# Patient Record
Sex: Female | Born: 1935 | Race: White | Hispanic: No | Marital: Married | Smoking: Never smoker
Health system: Southern US, Community
[De-identification: ages and names within clinical notes are randomized; demographics above are authoritative.]

## PROBLEM LIST (undated history)

## (undated) DIAGNOSIS — E785 Hyperlipidemia, unspecified: Secondary | ICD-10-CM

## (undated) DIAGNOSIS — Z8719 Personal history of other diseases of the digestive system: Secondary | ICD-10-CM

## (undated) DIAGNOSIS — K3184 Gastroparesis: Secondary | ICD-10-CM

## (undated) DIAGNOSIS — N189 Chronic kidney disease, unspecified: Secondary | ICD-10-CM

## (undated) DIAGNOSIS — I10 Essential (primary) hypertension: Secondary | ICD-10-CM

## (undated) DIAGNOSIS — D131 Benign neoplasm of stomach: Secondary | ICD-10-CM

## (undated) DIAGNOSIS — K219 Gastro-esophageal reflux disease without esophagitis: Secondary | ICD-10-CM

## (undated) DIAGNOSIS — I639 Cerebral infarction, unspecified: Secondary | ICD-10-CM

## (undated) DIAGNOSIS — R809 Proteinuria, unspecified: Principal | ICD-10-CM

## (undated) DIAGNOSIS — Z9889 Other specified postprocedural states: Secondary | ICD-10-CM

## (undated) DIAGNOSIS — E119 Type 2 diabetes mellitus without complications: Secondary | ICD-10-CM

## (undated) DIAGNOSIS — D649 Anemia, unspecified: Secondary | ICD-10-CM

## (undated) DIAGNOSIS — K635 Polyp of colon: Secondary | ICD-10-CM

## (undated) DIAGNOSIS — A4902 Methicillin resistant Staphylococcus aureus infection, unspecified site: Secondary | ICD-10-CM

## (undated) DIAGNOSIS — D472 Monoclonal gammopathy: Secondary | ICD-10-CM

## (undated) DIAGNOSIS — K760 Fatty (change of) liver, not elsewhere classified: Secondary | ICD-10-CM

## (undated) DIAGNOSIS — K5792 Diverticulitis of intestine, part unspecified, without perforation or abscess without bleeding: Secondary | ICD-10-CM

## (undated) DIAGNOSIS — M1990 Unspecified osteoarthritis, unspecified site: Secondary | ICD-10-CM

## (undated) HISTORY — DX: Gastro-esophageal reflux disease without esophagitis: K21.9

## (undated) HISTORY — DX: Chronic kidney disease, unspecified: N18.9

## (undated) HISTORY — DX: Cerebral infarction, unspecified: I63.9

## (undated) HISTORY — DX: Other specified postprocedural states: Z98.890

## (undated) HISTORY — PX: NASAL SINUS SURGERY: SHX719

## (undated) HISTORY — PX: BILATERAL OOPHORECTOMY: SHX1221

## (undated) HISTORY — DX: Fatty (change of) liver, not elsewhere classified: K76.0

## (undated) HISTORY — DX: Proteinuria, unspecified: R80.9

## (undated) HISTORY — DX: Personal history of other diseases of the digestive system: Z87.19

## (undated) HISTORY — DX: Monoclonal gammopathy: D47.2

## (undated) HISTORY — DX: Hyperlipidemia, unspecified: E78.5

## (undated) HISTORY — PX: REPLACEMENT TOTAL KNEE: SUR1224

## (undated) HISTORY — PX: LAPAROSCOPIC COLOSTOMY: SHX1921

## (undated) HISTORY — DX: Essential (primary) hypertension: I10

## (undated) HISTORY — DX: Polyp of colon: K63.5

## (undated) HISTORY — PX: ABDOMINAL HERNIA REPAIR: SHX539

## (undated) HISTORY — DX: Benign neoplasm of stomach: D13.1

## (undated) HISTORY — DX: Anemia, unspecified: D64.9

## (undated) HISTORY — DX: Methicillin resistant Staphylococcus aureus infection, unspecified site: A49.02

## (undated) HISTORY — PX: PARTIAL COLECTOMY: SHX5273

## (undated) HISTORY — DX: Type 2 diabetes mellitus without complications: E11.9

## (undated) HISTORY — DX: Unspecified osteoarthritis, unspecified site: M19.90

## (undated) HISTORY — DX: Gastroparesis: K31.84

## (undated) HISTORY — PX: OTHER SURGICAL HISTORY: SHX169

## (undated) HISTORY — PX: TONSILLECTOMY: SUR1361

## (undated) HISTORY — PX: ROTATOR CUFF REPAIR: SHX139

---

## 1999-01-08 ENCOUNTER — Other Ambulatory Visit: Admission: RE | Admit: 1999-01-08 | Discharge: 1999-01-08 | Payer: Self-pay | Admitting: Family Medicine

## 1999-01-19 ENCOUNTER — Ambulatory Visit (HOSPITAL_COMMUNITY): Admission: RE | Admit: 1999-01-19 | Discharge: 1999-01-19 | Payer: Self-pay | Admitting: *Deleted

## 2000-01-21 ENCOUNTER — Encounter: Payer: Self-pay | Admitting: Family Medicine

## 2000-01-21 ENCOUNTER — Encounter: Admission: RE | Admit: 2000-01-21 | Discharge: 2000-01-21 | Payer: Self-pay | Admitting: Family Medicine

## 2000-03-16 ENCOUNTER — Encounter: Payer: Self-pay | Admitting: Gastroenterology

## 2000-03-16 ENCOUNTER — Other Ambulatory Visit: Admission: RE | Admit: 2000-03-16 | Discharge: 2000-04-07 | Payer: Self-pay | Admitting: Family Medicine

## 2000-03-16 ENCOUNTER — Ambulatory Visit (HOSPITAL_COMMUNITY): Admission: RE | Admit: 2000-03-16 | Discharge: 2000-03-16 | Payer: Self-pay | Admitting: Gastroenterology

## 2000-04-07 ENCOUNTER — Encounter: Payer: Self-pay | Admitting: Family Medicine

## 2000-04-07 ENCOUNTER — Ambulatory Visit (HOSPITAL_COMMUNITY): Admission: RE | Admit: 2000-04-07 | Discharge: 2000-04-07 | Payer: Self-pay | Admitting: Family Medicine

## 2000-04-14 ENCOUNTER — Encounter (INDEPENDENT_AMBULATORY_CARE_PROVIDER_SITE_OTHER): Payer: Self-pay

## 2000-04-14 ENCOUNTER — Other Ambulatory Visit: Admission: RE | Admit: 2000-04-14 | Discharge: 2000-04-14 | Payer: Self-pay | Admitting: Obstetrics and Gynecology

## 2000-04-15 ENCOUNTER — Encounter (INDEPENDENT_AMBULATORY_CARE_PROVIDER_SITE_OTHER): Payer: Self-pay | Admitting: *Deleted

## 2000-04-15 ENCOUNTER — Ambulatory Visit (HOSPITAL_COMMUNITY): Admission: RE | Admit: 2000-04-15 | Discharge: 2000-04-15 | Payer: Self-pay | Admitting: Surgery

## 2000-08-01 ENCOUNTER — Encounter: Payer: Self-pay | Admitting: Orthopedic Surgery

## 2000-08-01 ENCOUNTER — Encounter: Admission: RE | Admit: 2000-08-01 | Discharge: 2000-08-01 | Payer: Self-pay | Admitting: Orthopedic Surgery

## 2000-08-31 ENCOUNTER — Inpatient Hospital Stay (HOSPITAL_COMMUNITY): Admission: RE | Admit: 2000-08-31 | Discharge: 2000-09-02 | Payer: Self-pay | Admitting: Orthopedic Surgery

## 2001-01-24 ENCOUNTER — Encounter: Payer: Self-pay | Admitting: Family Medicine

## 2001-01-24 ENCOUNTER — Encounter: Admission: RE | Admit: 2001-01-24 | Discharge: 2001-01-24 | Payer: Self-pay | Admitting: Family Medicine

## 2001-05-12 ENCOUNTER — Other Ambulatory Visit: Admission: RE | Admit: 2001-05-12 | Discharge: 2001-05-12 | Payer: Self-pay | Admitting: Obstetrics and Gynecology

## 2001-05-15 ENCOUNTER — Encounter: Payer: Self-pay | Admitting: Family Medicine

## 2001-05-15 ENCOUNTER — Encounter: Admission: RE | Admit: 2001-05-15 | Discharge: 2001-05-15 | Payer: Self-pay | Admitting: Family Medicine

## 2001-08-04 ENCOUNTER — Encounter: Payer: Self-pay | Admitting: Anesthesiology

## 2001-08-11 ENCOUNTER — Observation Stay (HOSPITAL_COMMUNITY): Admission: RE | Admit: 2001-08-11 | Discharge: 2001-08-13 | Payer: Self-pay | Admitting: Orthopedic Surgery

## 2001-08-12 ENCOUNTER — Encounter: Payer: Self-pay | Admitting: Orthopedic Surgery

## 2002-02-01 ENCOUNTER — Encounter: Admission: RE | Admit: 2002-02-01 | Discharge: 2002-02-01 | Payer: Self-pay | Admitting: Family Medicine

## 2002-02-01 ENCOUNTER — Encounter: Payer: Self-pay | Admitting: Family Medicine

## 2002-06-22 ENCOUNTER — Ambulatory Visit (HOSPITAL_COMMUNITY): Admission: RE | Admit: 2002-06-22 | Discharge: 2002-06-22 | Payer: Self-pay | Admitting: Family Medicine

## 2002-06-22 ENCOUNTER — Encounter: Payer: Self-pay | Admitting: Family Medicine

## 2002-07-23 ENCOUNTER — Other Ambulatory Visit: Admission: RE | Admit: 2002-07-23 | Discharge: 2002-07-23 | Payer: Self-pay | Admitting: Obstetrics and Gynecology

## 2002-10-23 ENCOUNTER — Encounter: Payer: Self-pay | Admitting: Family Medicine

## 2002-10-23 ENCOUNTER — Encounter: Admission: RE | Admit: 2002-10-23 | Discharge: 2002-10-23 | Payer: Self-pay | Admitting: Family Medicine

## 2003-03-12 ENCOUNTER — Encounter: Admission: RE | Admit: 2003-03-12 | Discharge: 2003-03-12 | Payer: Self-pay | Admitting: Family Medicine

## 2003-03-12 ENCOUNTER — Encounter: Payer: Self-pay | Admitting: Family Medicine

## 2003-06-05 ENCOUNTER — Encounter: Payer: Self-pay | Admitting: Gastroenterology

## 2003-06-05 ENCOUNTER — Encounter (INDEPENDENT_AMBULATORY_CARE_PROVIDER_SITE_OTHER): Payer: Self-pay | Admitting: Specialist

## 2003-06-05 ENCOUNTER — Ambulatory Visit (HOSPITAL_COMMUNITY): Admission: RE | Admit: 2003-06-05 | Discharge: 2003-06-05 | Payer: Self-pay | Admitting: Gastroenterology

## 2003-11-02 DIAGNOSIS — K5792 Diverticulitis of intestine, part unspecified, without perforation or abscess without bleeding: Secondary | ICD-10-CM

## 2003-11-02 HISTORY — DX: Diverticulitis of intestine, part unspecified, without perforation or abscess without bleeding: K57.92

## 2004-07-09 ENCOUNTER — Ambulatory Visit (HOSPITAL_COMMUNITY): Admission: RE | Admit: 2004-07-09 | Discharge: 2004-07-09 | Payer: Self-pay | Admitting: Family Medicine

## 2004-09-03 ENCOUNTER — Other Ambulatory Visit: Admission: RE | Admit: 2004-09-03 | Discharge: 2004-09-03 | Payer: Self-pay | Admitting: Family Medicine

## 2004-09-22 ENCOUNTER — Ambulatory Visit: Payer: Self-pay | Admitting: Gastroenterology

## 2004-10-01 LAB — HM COLONOSCOPY

## 2004-10-19 ENCOUNTER — Ambulatory Visit: Payer: Self-pay | Admitting: Gastroenterology

## 2004-11-06 ENCOUNTER — Ambulatory Visit: Payer: Self-pay | Admitting: Gastroenterology

## 2005-02-04 ENCOUNTER — Emergency Department (HOSPITAL_COMMUNITY): Admission: EM | Admit: 2005-02-04 | Discharge: 2005-02-04 | Payer: Self-pay | Admitting: Emergency Medicine

## 2005-09-16 ENCOUNTER — Ambulatory Visit: Payer: Self-pay | Admitting: Gastroenterology

## 2005-09-20 ENCOUNTER — Ambulatory Visit: Payer: Self-pay | Admitting: Cardiology

## 2005-10-05 ENCOUNTER — Ambulatory Visit: Payer: Self-pay | Admitting: Gastroenterology

## 2005-11-11 ENCOUNTER — Ambulatory Visit (HOSPITAL_COMMUNITY): Admission: RE | Admit: 2005-11-11 | Discharge: 2005-11-11 | Payer: Self-pay | Admitting: Orthopedic Surgery

## 2005-11-18 ENCOUNTER — Ambulatory Visit: Payer: Self-pay | Admitting: Gastroenterology

## 2006-03-31 ENCOUNTER — Encounter: Admission: RE | Admit: 2006-03-31 | Discharge: 2006-04-26 | Payer: Self-pay | Admitting: Family Medicine

## 2006-04-27 ENCOUNTER — Encounter: Admission: RE | Admit: 2006-04-27 | Discharge: 2006-05-30 | Payer: Self-pay | Admitting: Family Medicine

## 2006-09-01 ENCOUNTER — Encounter: Admission: RE | Admit: 2006-09-01 | Discharge: 2006-09-01 | Payer: Self-pay | Admitting: Family Medicine

## 2007-07-11 ENCOUNTER — Inpatient Hospital Stay (HOSPITAL_COMMUNITY): Admission: RE | Admit: 2007-07-11 | Discharge: 2007-07-17 | Payer: Self-pay | Admitting: Orthopedic Surgery

## 2007-11-09 ENCOUNTER — Encounter: Admission: RE | Admit: 2007-11-09 | Discharge: 2007-11-09 | Payer: Self-pay | Admitting: Family Medicine

## 2008-01-31 LAB — HM DIABETES EYE EXAM

## 2008-06-25 ENCOUNTER — Ambulatory Visit (HOSPITAL_COMMUNITY): Admission: RE | Admit: 2008-06-25 | Discharge: 2008-06-25 | Payer: Self-pay | Admitting: Orthopedic Surgery

## 2008-07-10 ENCOUNTER — Encounter: Admission: RE | Admit: 2008-07-10 | Discharge: 2008-09-11 | Payer: Self-pay | Admitting: Orthopedic Surgery

## 2008-07-11 ENCOUNTER — Encounter: Payer: Self-pay | Admitting: Internal Medicine

## 2008-08-02 ENCOUNTER — Inpatient Hospital Stay (HOSPITAL_COMMUNITY): Admission: EM | Admit: 2008-08-02 | Discharge: 2008-08-05 | Payer: Self-pay | Admitting: Emergency Medicine

## 2008-08-05 ENCOUNTER — Ambulatory Visit: Payer: Self-pay | Admitting: Gastroenterology

## 2008-08-12 ENCOUNTER — Ambulatory Visit: Payer: Self-pay | Admitting: Internal Medicine

## 2008-08-12 DIAGNOSIS — I10 Essential (primary) hypertension: Secondary | ICD-10-CM

## 2008-08-12 DIAGNOSIS — E119 Type 2 diabetes mellitus without complications: Secondary | ICD-10-CM | POA: Insufficient documentation

## 2008-08-12 DIAGNOSIS — M1990 Unspecified osteoarthritis, unspecified site: Secondary | ICD-10-CM | POA: Insufficient documentation

## 2008-08-12 DIAGNOSIS — Z96659 Presence of unspecified artificial knee joint: Secondary | ICD-10-CM

## 2008-08-12 DIAGNOSIS — M25569 Pain in unspecified knee: Secondary | ICD-10-CM | POA: Insufficient documentation

## 2008-08-12 DIAGNOSIS — E785 Hyperlipidemia, unspecified: Secondary | ICD-10-CM

## 2008-08-12 DIAGNOSIS — K219 Gastro-esophageal reflux disease without esophagitis: Secondary | ICD-10-CM | POA: Insufficient documentation

## 2008-08-12 DIAGNOSIS — Z8719 Personal history of other diseases of the digestive system: Secondary | ICD-10-CM | POA: Insufficient documentation

## 2008-08-12 DIAGNOSIS — M810 Age-related osteoporosis without current pathological fracture: Secondary | ICD-10-CM

## 2008-08-14 ENCOUNTER — Encounter: Payer: Self-pay | Admitting: Gastroenterology

## 2008-08-30 ENCOUNTER — Ambulatory Visit: Payer: Self-pay | Admitting: Cardiology

## 2008-10-11 ENCOUNTER — Encounter: Payer: Self-pay | Admitting: Gastroenterology

## 2008-10-11 ENCOUNTER — Encounter (INDEPENDENT_AMBULATORY_CARE_PROVIDER_SITE_OTHER): Payer: Self-pay | Admitting: General Surgery

## 2008-10-11 ENCOUNTER — Inpatient Hospital Stay (HOSPITAL_COMMUNITY): Admission: RE | Admit: 2008-10-11 | Discharge: 2008-10-25 | Payer: Self-pay | Admitting: General Surgery

## 2008-10-28 ENCOUNTER — Inpatient Hospital Stay (HOSPITAL_COMMUNITY): Admission: AD | Admit: 2008-10-28 | Discharge: 2008-11-12 | Payer: Self-pay | Admitting: General Surgery

## 2008-11-20 ENCOUNTER — Emergency Department (HOSPITAL_COMMUNITY): Admission: EM | Admit: 2008-11-20 | Discharge: 2008-11-20 | Payer: Self-pay | Admitting: Emergency Medicine

## 2008-12-09 ENCOUNTER — Telehealth: Payer: Self-pay | Admitting: Gastroenterology

## 2008-12-10 ENCOUNTER — Encounter: Payer: Self-pay | Admitting: Gastroenterology

## 2008-12-10 ENCOUNTER — Inpatient Hospital Stay (HOSPITAL_COMMUNITY): Admission: AD | Admit: 2008-12-10 | Discharge: 2008-12-13 | Payer: Self-pay | Admitting: General Surgery

## 2009-03-07 ENCOUNTER — Ambulatory Visit (HOSPITAL_COMMUNITY): Admission: RE | Admit: 2009-03-07 | Discharge: 2009-03-07 | Payer: Self-pay | Admitting: Family Medicine

## 2009-04-01 ENCOUNTER — Ambulatory Visit: Payer: Self-pay | Admitting: Vascular Surgery

## 2009-04-01 ENCOUNTER — Inpatient Hospital Stay (HOSPITAL_COMMUNITY): Admission: AD | Admit: 2009-04-01 | Discharge: 2009-04-03 | Payer: Self-pay | Admitting: General Surgery

## 2009-04-02 ENCOUNTER — Encounter (INDEPENDENT_AMBULATORY_CARE_PROVIDER_SITE_OTHER): Payer: Self-pay | Admitting: General Surgery

## 2009-04-16 ENCOUNTER — Encounter: Admission: RE | Admit: 2009-04-16 | Discharge: 2009-04-16 | Payer: Self-pay | Admitting: Family Medicine

## 2009-04-25 ENCOUNTER — Encounter: Admission: RE | Admit: 2009-04-25 | Discharge: 2009-04-25 | Payer: Self-pay | Admitting: Family Medicine

## 2009-05-19 ENCOUNTER — Ambulatory Visit (HOSPITAL_BASED_OUTPATIENT_CLINIC_OR_DEPARTMENT_OTHER): Admission: RE | Admit: 2009-05-19 | Discharge: 2009-05-19 | Payer: Self-pay | Admitting: General Surgery

## 2009-05-19 ENCOUNTER — Encounter (INDEPENDENT_AMBULATORY_CARE_PROVIDER_SITE_OTHER): Payer: Self-pay | Admitting: General Surgery

## 2009-05-19 ENCOUNTER — Encounter: Admission: RE | Admit: 2009-05-19 | Discharge: 2009-05-19 | Payer: Self-pay | Admitting: General Surgery

## 2009-06-06 ENCOUNTER — Ambulatory Visit (HOSPITAL_COMMUNITY): Admission: RE | Admit: 2009-06-06 | Discharge: 2009-06-06 | Payer: Self-pay | Admitting: General Surgery

## 2009-07-02 DIAGNOSIS — A4902 Methicillin resistant Staphylococcus aureus infection, unspecified site: Secondary | ICD-10-CM

## 2009-07-02 HISTORY — DX: Methicillin resistant Staphylococcus aureus infection, unspecified site: A49.02

## 2009-07-10 ENCOUNTER — Encounter (INDEPENDENT_AMBULATORY_CARE_PROVIDER_SITE_OTHER): Payer: Self-pay | Admitting: General Surgery

## 2009-07-10 ENCOUNTER — Inpatient Hospital Stay (HOSPITAL_COMMUNITY): Admission: RE | Admit: 2009-07-10 | Discharge: 2009-07-23 | Payer: Self-pay | Admitting: General Surgery

## 2009-07-28 ENCOUNTER — Inpatient Hospital Stay (HOSPITAL_COMMUNITY): Admission: EM | Admit: 2009-07-28 | Discharge: 2009-09-16 | Payer: Self-pay | Admitting: Emergency Medicine

## 2009-07-28 ENCOUNTER — Ambulatory Visit: Payer: Self-pay | Admitting: Internal Medicine

## 2009-07-31 ENCOUNTER — Ambulatory Visit: Payer: Self-pay | Admitting: Vascular Surgery

## 2009-07-31 ENCOUNTER — Encounter (INDEPENDENT_AMBULATORY_CARE_PROVIDER_SITE_OTHER): Payer: Self-pay | Admitting: Internal Medicine

## 2009-08-01 ENCOUNTER — Encounter (INDEPENDENT_AMBULATORY_CARE_PROVIDER_SITE_OTHER): Payer: Self-pay | Admitting: Internal Medicine

## 2009-08-05 ENCOUNTER — Ambulatory Visit: Payer: Self-pay | Admitting: Internal Medicine

## 2009-08-06 ENCOUNTER — Ambulatory Visit: Admission: RE | Admit: 2009-08-06 | Discharge: 2009-08-06 | Payer: Self-pay | Admitting: General Surgery

## 2009-08-06 ENCOUNTER — Encounter (INDEPENDENT_AMBULATORY_CARE_PROVIDER_SITE_OTHER): Payer: Self-pay | Admitting: General Surgery

## 2009-08-07 ENCOUNTER — Encounter (INDEPENDENT_AMBULATORY_CARE_PROVIDER_SITE_OTHER): Payer: Self-pay | Admitting: General Surgery

## 2009-08-21 ENCOUNTER — Encounter: Payer: Self-pay | Admitting: Internal Medicine

## 2009-09-02 ENCOUNTER — Ambulatory Visit: Payer: Self-pay | Admitting: Surgery

## 2009-09-09 ENCOUNTER — Ambulatory Visit: Payer: Self-pay | Admitting: Internal Medicine

## 2009-10-07 ENCOUNTER — Ambulatory Visit: Payer: Self-pay | Admitting: Vascular Surgery

## 2009-10-17 ENCOUNTER — Ambulatory Visit: Payer: Self-pay | Admitting: Gastroenterology

## 2009-10-21 ENCOUNTER — Encounter (INDEPENDENT_AMBULATORY_CARE_PROVIDER_SITE_OTHER): Payer: Self-pay | Admitting: Internal Medicine

## 2009-10-21 ENCOUNTER — Ambulatory Visit: Payer: Self-pay | Admitting: Cardiology

## 2009-10-21 ENCOUNTER — Inpatient Hospital Stay (HOSPITAL_COMMUNITY): Admission: EM | Admit: 2009-10-21 | Discharge: 2009-10-24 | Payer: Self-pay | Admitting: Emergency Medicine

## 2009-10-21 ENCOUNTER — Ambulatory Visit: Payer: Self-pay | Admitting: Vascular Surgery

## 2009-10-22 ENCOUNTER — Encounter (INDEPENDENT_AMBULATORY_CARE_PROVIDER_SITE_OTHER): Payer: Self-pay | Admitting: Internal Medicine

## 2009-11-19 ENCOUNTER — Encounter: Payer: Self-pay | Admitting: Internal Medicine

## 2009-12-01 ENCOUNTER — Ambulatory Visit: Payer: Self-pay | Admitting: Vascular Surgery

## 2009-12-01 ENCOUNTER — Ambulatory Visit (HOSPITAL_COMMUNITY)
Admission: RE | Admit: 2009-12-01 | Discharge: 2009-12-01 | Payer: Self-pay | Source: Home / Self Care | Admitting: Vascular Surgery

## 2010-02-12 ENCOUNTER — Encounter: Admission: RE | Admit: 2010-02-12 | Discharge: 2010-05-13 | Payer: Self-pay | Admitting: Family Medicine

## 2010-06-11 ENCOUNTER — Encounter: Admission: RE | Admit: 2010-06-11 | Discharge: 2010-06-11 | Payer: Self-pay | Admitting: Family Medicine

## 2010-06-11 LAB — HM MAMMOGRAPHY

## 2010-11-22 ENCOUNTER — Encounter: Payer: Self-pay | Admitting: Family Medicine

## 2010-11-23 ENCOUNTER — Encounter: Payer: Self-pay | Admitting: Family Medicine

## 2010-12-03 NOTE — Letter (Signed)
Summary: [facility]   Imported By: Sherian Rein 12/08/2009 10:24:52  _____________________________________________________________________  External Attachment:    Type:   Image     Comment:   External Document

## 2010-12-31 IMAGING — CR DG CHEST 1V PORT
1 series · 1 of 1 positions shown · non-contrast
Comparison: 12/10/2008

CLINICAL DATA: Possible pneumonia.  Abdominal wall infection.  Line
placement.

PORTABLE CHEST - 1 VIEW

[view not recorded]
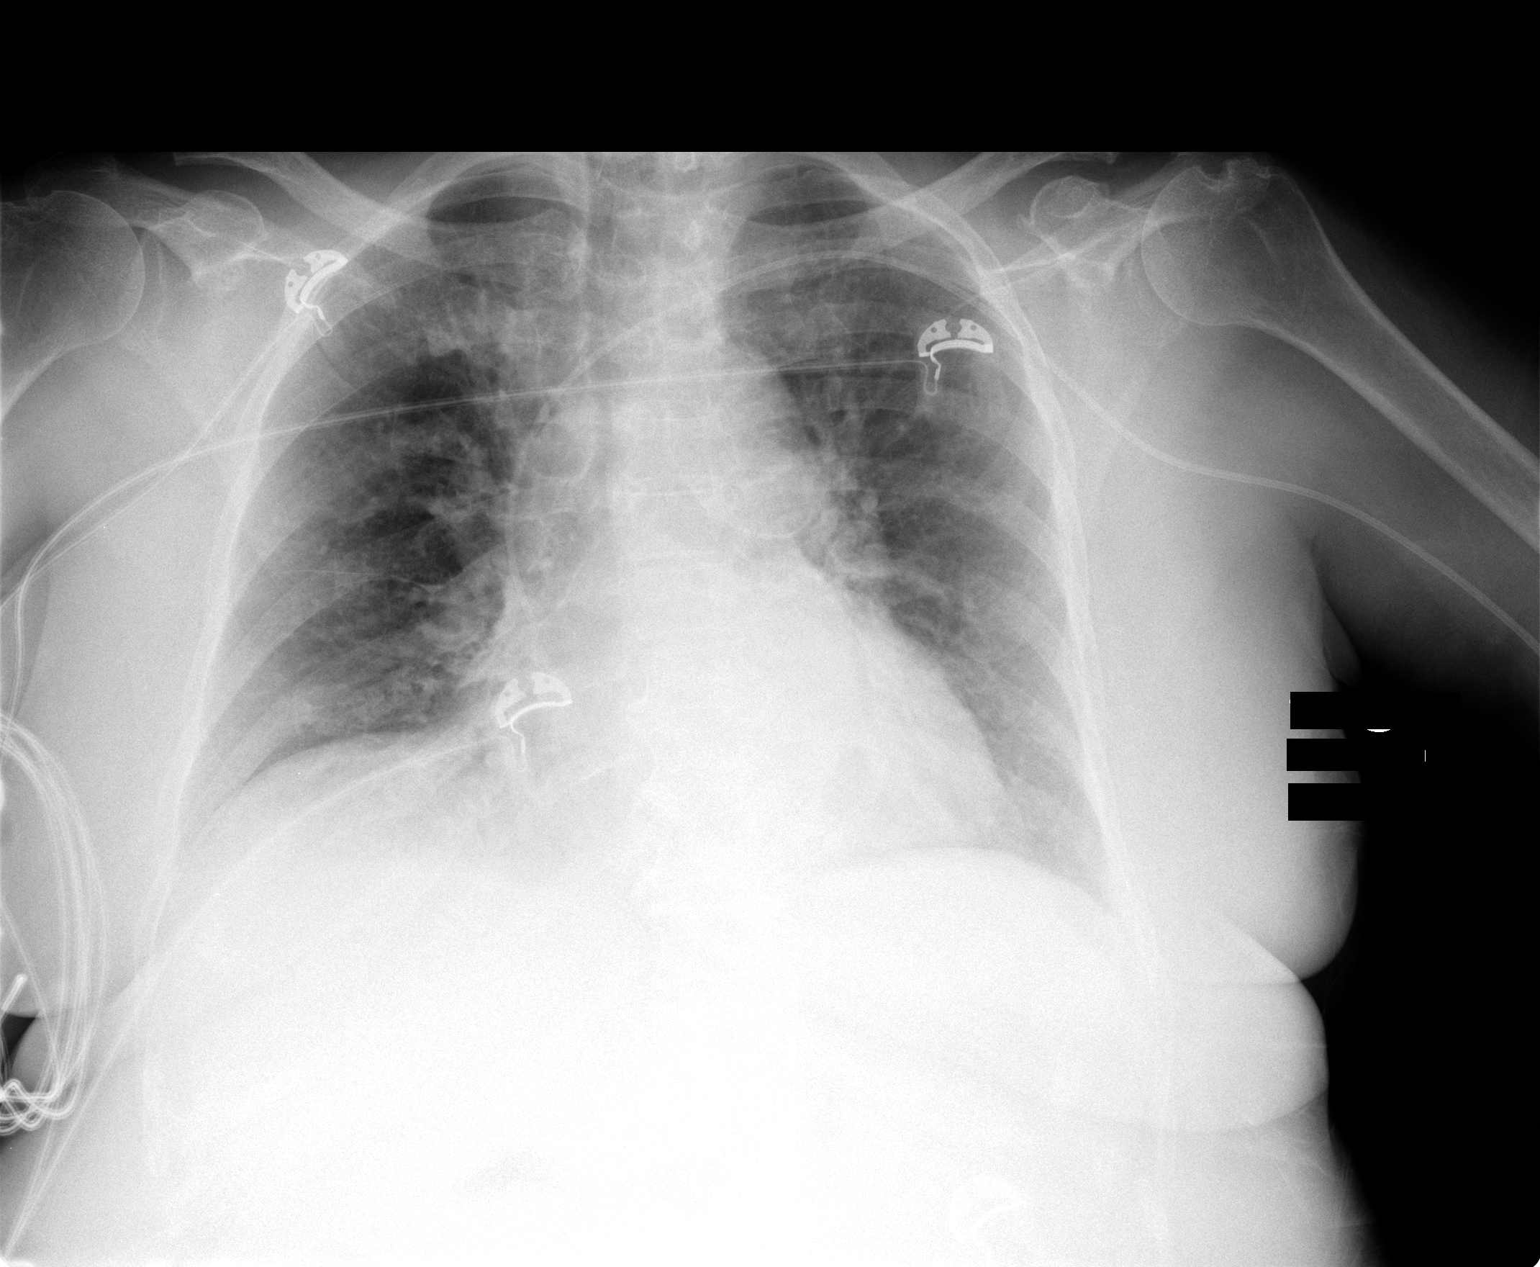

[1 of 1 positions shown; findings below may reference images not displayed]

FINDINGS: Left-sided PICC line has been placed.  The tip overlies
the lower superior vena cava.

Heart size is within normal limits accounting for the portable
position of the patient.  Patchy parenchymal infiltrates are
identified, right greater than left.  Right subpulmonic pleural
effusion is suspected. No evidence for pneumothorax.

There are erosions along the upper surface of both humeral heads.
IMPRESSION: Left-sided PICC line tip to the superior vena cava.

## 2011-01-03 IMAGING — CR DG CHEST 1V PORT
1 series · 1 of 1 positions shown · non-contrast
Comparison: 07/28/2009 study.

CLINICAL DATA: History given of nausea and chest pain.  History of
pulmonary infiltrates.  Follow-up.

PORTABLE CHEST - 1 VIEW

[view not recorded]
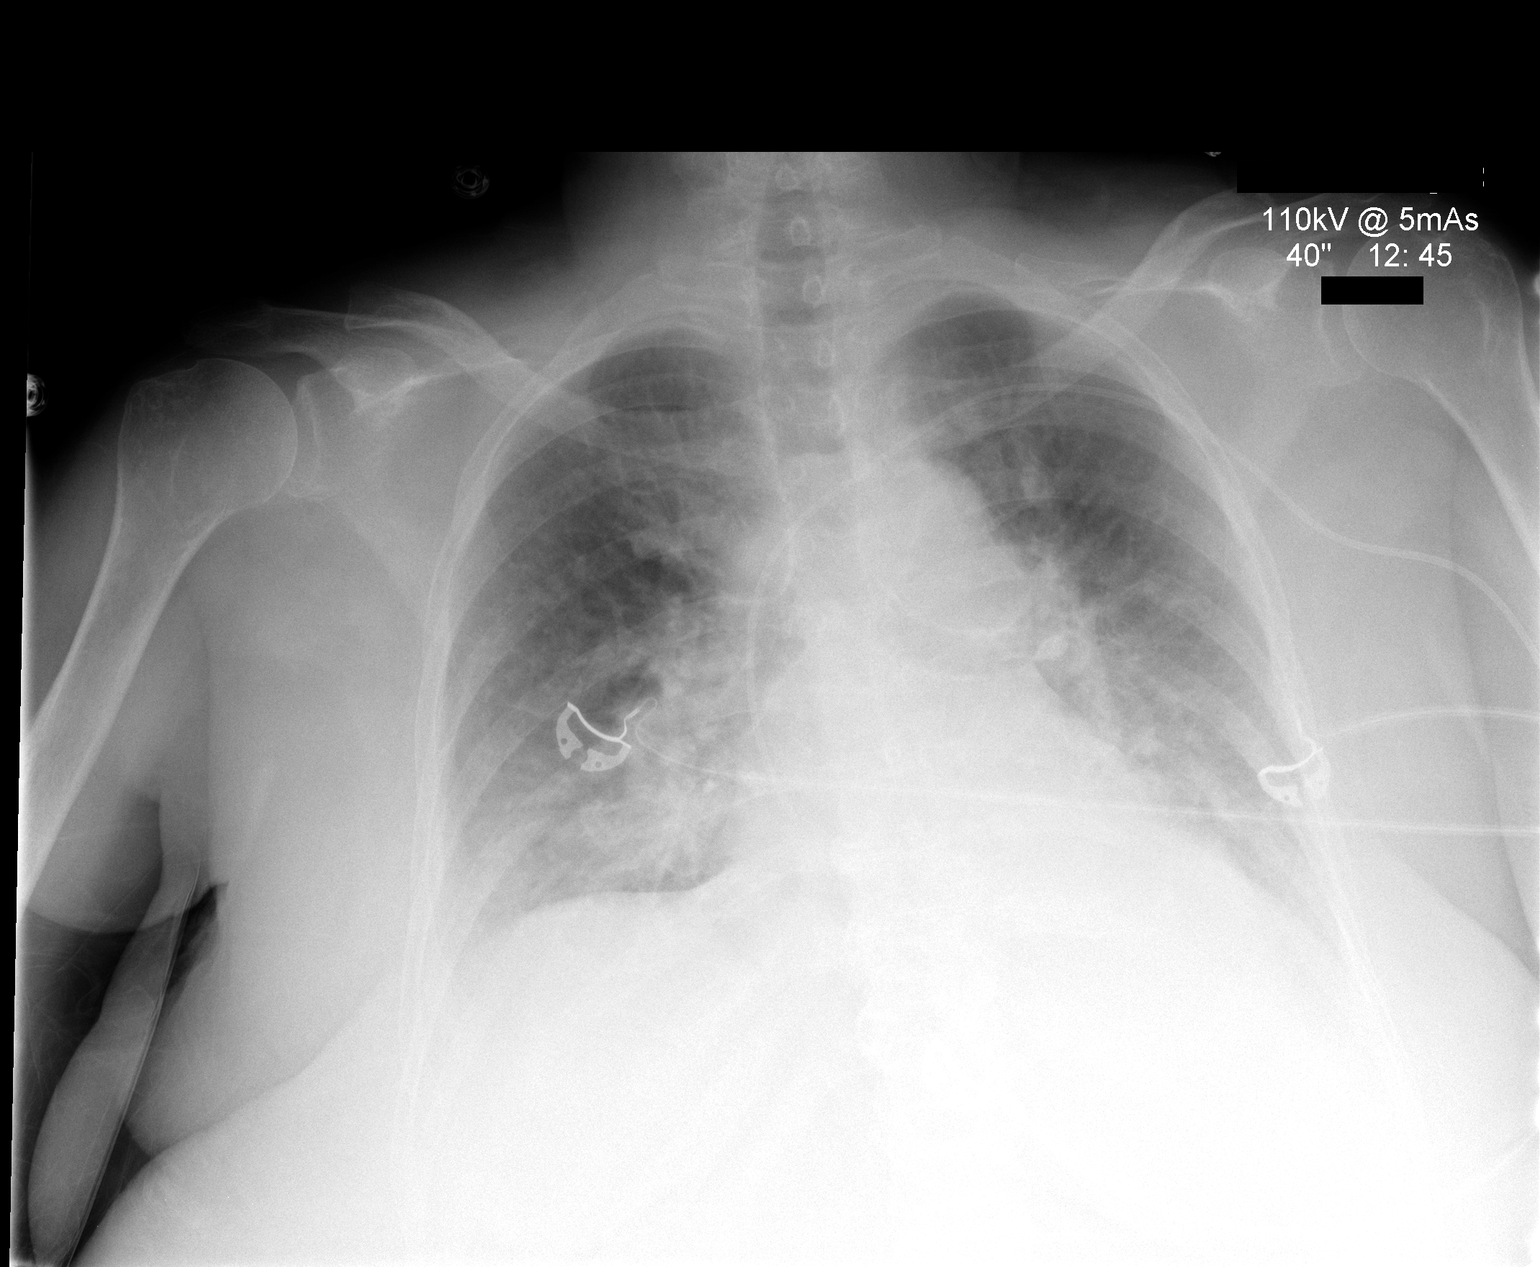

[1 of 1 positions shown; findings below may reference images not displayed]

FINDINGS: Left PICC is in place with tip in proximal superior vena
cava and cavoatrial junction.  No pneumothorax is evident.  Cardiac
silhouette is mildly enlarged.  It is slightly larger than on the
previous study. There is mild upper lobe vessel prominence with
central vascular congestion pattern.  There is minimal medial
basilar atelectasis on the right with slight indistinctness of
right costophrenic angle which may reflect a small amount of
pleural fluid.  On the previous study the left hemidiaphragm was
well visualized. There is now opacity in the inferior left
hemithorax with obliteration margin of left hemidiaphragm.  This is
consistent with atelectasis, infiltrate and / or pleural effusion.
Ectasia and nonaneurysmal calcification of the thoracic aorta is
seen. There is mild degenerative spondylosis compatible with age.
There is a mildly osteopenic appearance of the bones.
IMPRESSION: Left PICC is in place with no evidence of pneumothorax.

There is slight enlargement of the cardiac silhouette which appears
slightly larger than on previous study.  There is mild vascular
congestion pattern.

There is minimal right basilar atelectasis and infiltrative density
with indistinctness of right costophrenic angle which may reflect a
small amount of effusion.

There is increased opacity in the inferior left hemithorax with
obliteration of the margin of the left hemidiaphragm and
costophrenic angle.  This may reflect atelectasis, infiltrate, and
/ or pleural effusion.

Findings may reflect an element of volume overload, congestive
heart failure, pneumonia, or combination.

## 2011-01-11 IMAGING — CR DG CHEST 1V PORT
1 series · 1 of 1 positions shown · non-contrast
Comparison: Chest 08/07/2009.

CLINICAL DATA: Respiratory distress.

PORTABLE CHEST - 1 VIEW

[AP]
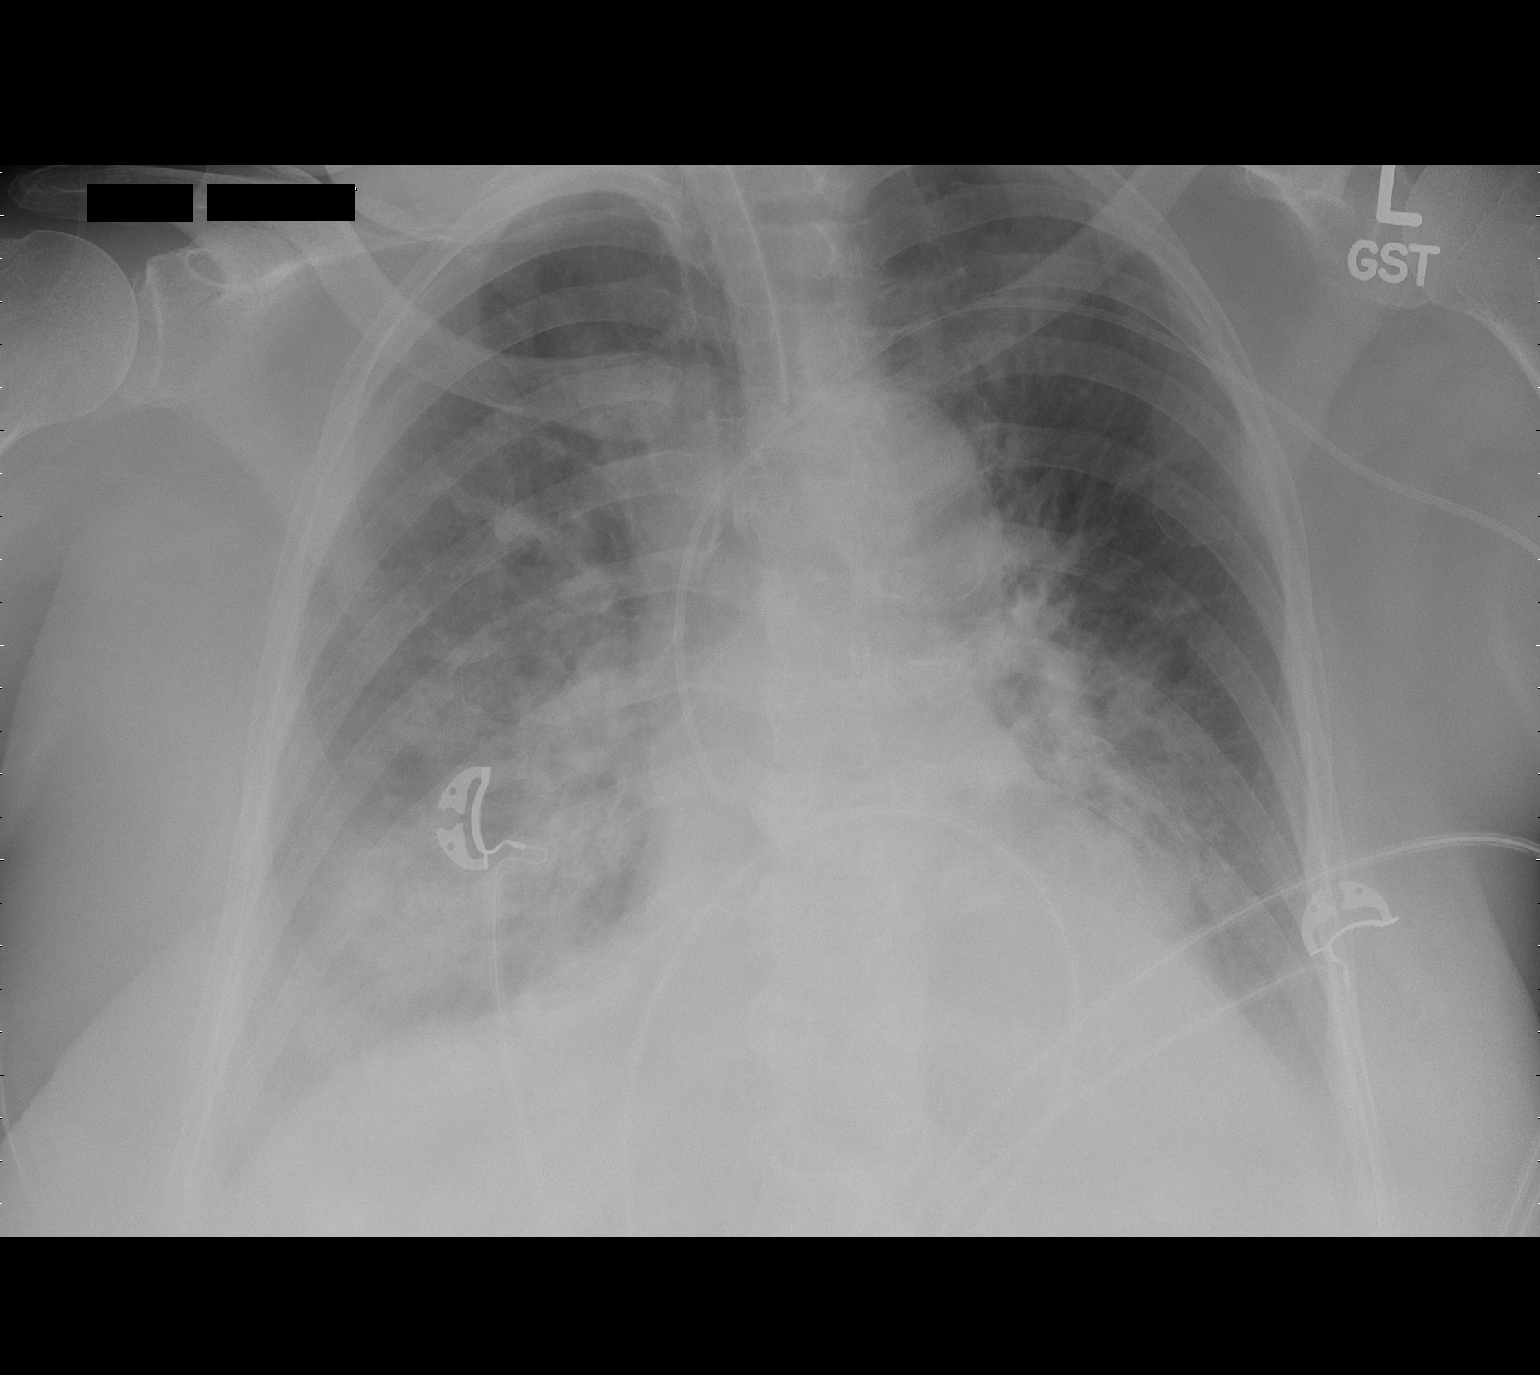

[1 of 1 positions shown; findings below may reference images not displayed]

FINDINGS: Endotracheal tube and left PICC remain in place.  Right
effusion and airspace disease are unchanged.  There has been some
improvement in the left upper and mid lung zone airspace disease.
Heart size upper normal.
IMPRESSION: Some improvement in left side airspace disease with no change on
the right.

## 2011-01-12 IMAGING — CR DG ABD PORTABLE 1V
1 series · 1 of 1 positions shown · non-contrast
Comparison: 07/28/2009

CLINICAL DATA: Feeding tube placement.

ABDOMEN - 1 VIEW

[AP]
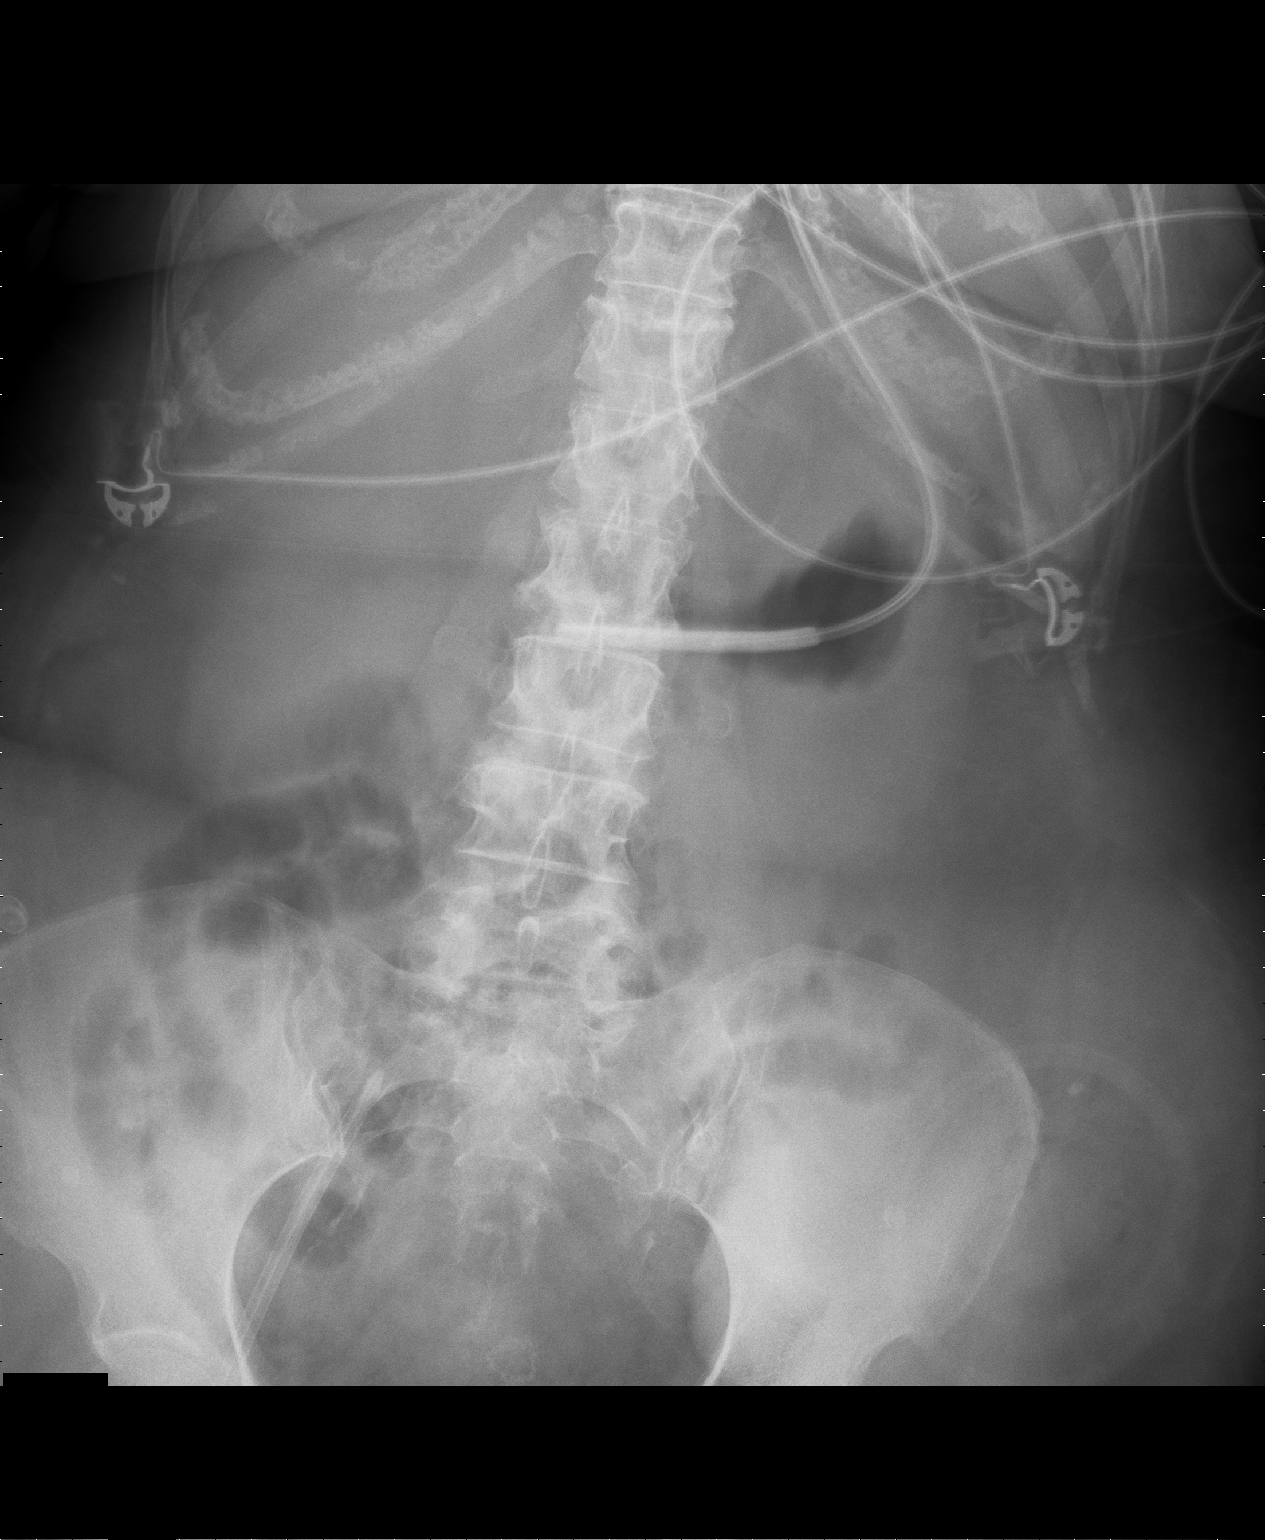

[1 of 1 positions shown; findings below may reference images not displayed]

FINDINGS: Feeding tube is in place with the tip in the distal
stomach.  Nonobstructive bowel gas pattern noted.
IMPRESSION: Feeding tube tip distal stomach.

## 2011-01-18 ENCOUNTER — Ambulatory Visit: Payer: Medicare Other | Attending: Family Medicine | Admitting: Physical Therapy

## 2011-01-18 DIAGNOSIS — M545 Low back pain, unspecified: Secondary | ICD-10-CM | POA: Insufficient documentation

## 2011-01-18 DIAGNOSIS — IMO0001 Reserved for inherently not codable concepts without codable children: Secondary | ICD-10-CM | POA: Insufficient documentation

## 2011-01-18 DIAGNOSIS — R5381 Other malaise: Secondary | ICD-10-CM | POA: Insufficient documentation

## 2011-01-18 DIAGNOSIS — M6281 Muscle weakness (generalized): Secondary | ICD-10-CM | POA: Insufficient documentation

## 2011-01-18 DIAGNOSIS — R293 Abnormal posture: Secondary | ICD-10-CM | POA: Insufficient documentation

## 2011-01-20 ENCOUNTER — Ambulatory Visit: Payer: Medicare Other | Admitting: Physical Therapy

## 2011-01-21 ENCOUNTER — Other Ambulatory Visit: Payer: Self-pay | Admitting: Dermatology

## 2011-01-26 ENCOUNTER — Ambulatory Visit: Payer: Medicare Other | Admitting: *Deleted

## 2011-01-29 ENCOUNTER — Ambulatory Visit: Payer: Medicare Other | Attending: Family Medicine | Admitting: Physical Therapy

## 2011-01-29 DIAGNOSIS — R5381 Other malaise: Secondary | ICD-10-CM | POA: Insufficient documentation

## 2011-01-29 DIAGNOSIS — M6281 Muscle weakness (generalized): Secondary | ICD-10-CM | POA: Insufficient documentation

## 2011-01-29 DIAGNOSIS — IMO0001 Reserved for inherently not codable concepts without codable children: Secondary | ICD-10-CM | POA: Insufficient documentation

## 2011-01-29 DIAGNOSIS — M545 Low back pain, unspecified: Secondary | ICD-10-CM | POA: Insufficient documentation

## 2011-01-29 DIAGNOSIS — R293 Abnormal posture: Secondary | ICD-10-CM | POA: Insufficient documentation

## 2011-02-01 LAB — GLUCOSE, CAPILLARY
Glucose-Capillary: 108 mg/dL — ABNORMAL HIGH (ref 70–99)
Glucose-Capillary: 110 mg/dL — ABNORMAL HIGH (ref 70–99)
Glucose-Capillary: 124 mg/dL — ABNORMAL HIGH (ref 70–99)
Glucose-Capillary: 125 mg/dL — ABNORMAL HIGH (ref 70–99)
Glucose-Capillary: 129 mg/dL — ABNORMAL HIGH (ref 70–99)
Glucose-Capillary: 134 mg/dL — ABNORMAL HIGH (ref 70–99)
Glucose-Capillary: 142 mg/dL — ABNORMAL HIGH (ref 70–99)
Glucose-Capillary: 143 mg/dL — ABNORMAL HIGH (ref 70–99)
Glucose-Capillary: 145 mg/dL — ABNORMAL HIGH (ref 70–99)
Glucose-Capillary: 153 mg/dL — ABNORMAL HIGH (ref 70–99)
Glucose-Capillary: 163 mg/dL — ABNORMAL HIGH (ref 70–99)
Glucose-Capillary: 185 mg/dL — ABNORMAL HIGH (ref 70–99)
Glucose-Capillary: 207 mg/dL — ABNORMAL HIGH (ref 70–99)
Glucose-Capillary: 98 mg/dL (ref 70–99)

## 2011-02-01 LAB — CBC
HCT: 36.2 % (ref 36.0–46.0)
HCT: 36.4 % (ref 36.0–46.0)
Hemoglobin: 11.8 g/dL — ABNORMAL LOW (ref 12.0–15.0)
MCHC: 31.5 g/dL (ref 30.0–36.0)
MCHC: 32.3 g/dL (ref 30.0–36.0)
MCHC: 32.6 g/dL (ref 30.0–36.0)
MCV: 95.8 fL (ref 78.0–100.0)
MCV: 97.2 fL (ref 78.0–100.0)
Platelets: 315 10*3/uL (ref 150–400)
Platelets: 337 10*3/uL (ref 150–400)
RBC: 3.21 MIL/uL — ABNORMAL LOW (ref 3.87–5.11)
RDW: 18.2 % — ABNORMAL HIGH (ref 11.5–15.5)
RDW: 18.3 % — ABNORMAL HIGH (ref 11.5–15.5)
WBC: 8 10*3/uL (ref 4.0–10.5)
WBC: 9.5 10*3/uL (ref 4.0–10.5)

## 2011-02-01 LAB — URINALYSIS, MICROSCOPIC ONLY
Bilirubin Urine: NEGATIVE
Ketones, ur: NEGATIVE mg/dL
Nitrite: NEGATIVE
Protein, ur: NEGATIVE mg/dL
Specific Gravity, Urine: 1.014 (ref 1.005–1.030)
Urobilinogen, UA: 0.2 mg/dL (ref 0.0–1.0)

## 2011-02-01 LAB — RENAL FUNCTION PANEL
Albumin: 2.3 g/dL — ABNORMAL LOW (ref 3.5–5.2)
BUN: 14 mg/dL (ref 6–23)
CO2: 18 mEq/L — ABNORMAL LOW (ref 19–32)
CO2: 18 mEq/L — ABNORMAL LOW (ref 19–32)
Calcium: 8.5 mg/dL (ref 8.4–10.5)
Calcium: 8.5 mg/dL (ref 8.4–10.5)
Chloride: 112 mEq/L (ref 96–112)
Chloride: 115 mEq/L — ABNORMAL HIGH (ref 96–112)
GFR calc Af Amer: 41 mL/min — ABNORMAL LOW (ref >60)
GFR calc Af Amer: 44 mL/min — ABNORMAL LOW (ref >60)
GFR calc non Af Amer: 36 mL/min — ABNORMAL LOW (ref >60)
Glucose, Bld: 155 mg/dL — ABNORMAL HIGH (ref 70–99)
Phosphorus: 4.9 mg/dL — ABNORMAL HIGH (ref 2.3–4.6)
Potassium: 4.3 mEq/L (ref 3.5–5.1)
Sodium: 138 mEq/L (ref 135–145)

## 2011-02-01 LAB — COMPREHENSIVE METABOLIC PANEL
ALT: 12 U/L (ref 0–35)
ALT: 18 U/L (ref 0–35)
AST: 20 U/L (ref 0–37)
Albumin: 2.7 g/dL — ABNORMAL LOW (ref 3.5–5.2)
Albumin: 2.7 g/dL — ABNORMAL LOW (ref 3.5–5.2)
Alkaline Phosphatase: 119 U/L — ABNORMAL HIGH (ref 39–117)
BUN: 16 mg/dL (ref 6–23)
BUN: 17 mg/dL (ref 6–23)
BUN: 19 mg/dL (ref 6–23)
CO2: 24 mEq/L (ref 19–32)
Calcium: 10.2 mg/dL (ref 8.4–10.5)
Calcium: 10.4 mg/dL (ref 8.4–10.5)
Calcium: 9.2 mg/dL (ref 8.4–10.5)
Chloride: 104 mEq/L (ref 96–112)
Creatinine, Ser: 1.42 mg/dL — ABNORMAL HIGH (ref 0.4–1.2)
Creatinine, Ser: 1.46 mg/dL — ABNORMAL HIGH (ref 0.4–1.2)
GFR calc non Af Amer: 35 mL/min — ABNORMAL LOW (ref >60)
GFR calc non Af Amer: 36 mL/min — ABNORMAL LOW (ref >60)
Glucose, Bld: 156 mg/dL — ABNORMAL HIGH (ref 70–99)
Potassium: 3.9 mEq/L (ref 3.5–5.1)
Sodium: 136 mEq/L (ref 135–145)
Total Bilirubin: 0.4 mg/dL (ref 0.3–1.2)
Total Bilirubin: 0.8 mg/dL (ref 0.3–1.2)
Total Protein: 6 g/dL (ref 6.0–8.3)
Total Protein: 6.9 g/dL (ref 6.0–8.3)

## 2011-02-01 LAB — URINE MICROSCOPIC-ADD ON

## 2011-02-01 LAB — CULTURE, BLOOD (ROUTINE X 2): Culture: NO GROWTH

## 2011-02-01 LAB — URINALYSIS, ROUTINE W REFLEX MICROSCOPIC
Bilirubin Urine: NEGATIVE
Glucose, UA: NEGATIVE mg/dL
Hgb urine dipstick: NEGATIVE
Specific Gravity, Urine: 1.022 (ref 1.005–1.030)
Urobilinogen, UA: 0.2 mg/dL (ref 0.0–1.0)
pH: 5 (ref 5.0–8.0)

## 2011-02-01 LAB — POCT CARDIAC MARKERS
CKMB, poc: 1.5 ng/mL (ref 1.0–8.0)
Troponin i, poc: <0.05 ng/mL (ref 0.00–0.09)

## 2011-02-01 LAB — CARDIAC PANEL(CRET KIN+CKTOT+MB+TROPI)
CK, MB: 1.6 ng/mL (ref 0.3–4.0)
Relative Index: INVALID (ref 0.0–2.5)
Total CK: 22 U/L (ref 7–177)
Total CK: 22 U/L (ref 7–177)
Troponin I: 0.04 ng/mL (ref 0.00–0.06)
Troponin I: 0.04 ng/mL (ref 0.00–0.06)

## 2011-02-01 LAB — APTT: aPTT: 29 seconds (ref 24–37)

## 2011-02-01 LAB — URINE CULTURE
Colony Count: 5000
Special Requests: NEGATIVE

## 2011-02-01 LAB — LIPID PANEL
Total CHOL/HDL Ratio: 3 RATIO
VLDL: 50 mg/dL — ABNORMAL HIGH (ref 0–40)

## 2011-02-01 LAB — DIFFERENTIAL
Basophils Absolute: 0 10*3/uL (ref 0.0–0.1)
Lymphocytes Relative: 11 % — ABNORMAL LOW (ref 12–46)
Lymphs Abs: 1.1 10*3/uL (ref 0.7–4.0)
Neutro Abs: 7.5 10*3/uL (ref 1.7–7.7)

## 2011-02-01 LAB — RPR: RPR Ser Ql: NONREACTIVE

## 2011-02-01 LAB — VITAMIN B12: Vitamin B-12: 419 pg/mL (ref 211–911)

## 2011-02-01 LAB — FOLATE: Folate: >20 ng/mL

## 2011-02-02 ENCOUNTER — Ambulatory Visit: Payer: Medicare Other | Attending: Family Medicine | Admitting: Physical Therapy

## 2011-02-02 DIAGNOSIS — R5381 Other malaise: Secondary | ICD-10-CM | POA: Insufficient documentation

## 2011-02-02 DIAGNOSIS — R293 Abnormal posture: Secondary | ICD-10-CM | POA: Insufficient documentation

## 2011-02-02 DIAGNOSIS — M545 Low back pain, unspecified: Secondary | ICD-10-CM | POA: Insufficient documentation

## 2011-02-02 DIAGNOSIS — M6281 Muscle weakness (generalized): Secondary | ICD-10-CM | POA: Insufficient documentation

## 2011-02-02 DIAGNOSIS — IMO0001 Reserved for inherently not codable concepts without codable children: Secondary | ICD-10-CM | POA: Insufficient documentation

## 2011-02-03 LAB — GLUCOSE, CAPILLARY
Glucose-Capillary: 116 mg/dL — ABNORMAL HIGH (ref 70–99)
Glucose-Capillary: 179 mg/dL — ABNORMAL HIGH (ref 70–99)
Glucose-Capillary: 109 mg/dL — ABNORMAL HIGH (ref 70–99)
Glucose-Capillary: 109 mg/dL — ABNORMAL HIGH (ref 70–99)
Glucose-Capillary: 119 mg/dL — ABNORMAL HIGH (ref 70–99)
Glucose-Capillary: 122 mg/dL — ABNORMAL HIGH (ref 70–99)
Glucose-Capillary: 122 mg/dL — ABNORMAL HIGH (ref 70–99)
Glucose-Capillary: 124 mg/dL — ABNORMAL HIGH (ref 70–99)
Glucose-Capillary: 124 mg/dL — ABNORMAL HIGH (ref 70–99)
Glucose-Capillary: 127 mg/dL — ABNORMAL HIGH (ref 70–99)
Glucose-Capillary: 129 mg/dL — ABNORMAL HIGH (ref 70–99)
Glucose-Capillary: 139 mg/dL — ABNORMAL HIGH (ref 70–99)
Glucose-Capillary: 141 mg/dL — ABNORMAL HIGH (ref 70–99)
Glucose-Capillary: 143 mg/dL — ABNORMAL HIGH (ref 70–99)
Glucose-Capillary: 148 mg/dL — ABNORMAL HIGH (ref 70–99)
Glucose-Capillary: 149 mg/dL — ABNORMAL HIGH (ref 70–99)
Glucose-Capillary: 152 mg/dL — ABNORMAL HIGH (ref 70–99)
Glucose-Capillary: 155 mg/dL — ABNORMAL HIGH (ref 70–99)
Glucose-Capillary: 158 mg/dL — ABNORMAL HIGH (ref 70–99)
Glucose-Capillary: 160 mg/dL — ABNORMAL HIGH (ref 70–99)
Glucose-Capillary: 161 mg/dL — ABNORMAL HIGH (ref 70–99)
Glucose-Capillary: 167 mg/dL — ABNORMAL HIGH (ref 70–99)
Glucose-Capillary: 168 mg/dL — ABNORMAL HIGH (ref 70–99)
Glucose-Capillary: 169 mg/dL — ABNORMAL HIGH (ref 70–99)
Glucose-Capillary: 170 mg/dL — ABNORMAL HIGH (ref 70–99)
Glucose-Capillary: 173 mg/dL — ABNORMAL HIGH (ref 70–99)
Glucose-Capillary: 173 mg/dL — ABNORMAL HIGH (ref 70–99)
Glucose-Capillary: 174 mg/dL — ABNORMAL HIGH (ref 70–99)
Glucose-Capillary: 174 mg/dL — ABNORMAL HIGH (ref 70–99)
Glucose-Capillary: 174 mg/dL — ABNORMAL HIGH (ref 70–99)
Glucose-Capillary: 190 mg/dL — ABNORMAL HIGH (ref 70–99)
Glucose-Capillary: 193 mg/dL — ABNORMAL HIGH (ref 70–99)
Glucose-Capillary: 196 mg/dL — ABNORMAL HIGH (ref 70–99)
Glucose-Capillary: 220 mg/dL — ABNORMAL HIGH (ref 70–99)
Glucose-Capillary: 221 mg/dL — ABNORMAL HIGH (ref 70–99)
Glucose-Capillary: 222 mg/dL — ABNORMAL HIGH (ref 70–99)
Glucose-Capillary: 272 mg/dL — ABNORMAL HIGH (ref 70–99)
Glucose-Capillary: 314 mg/dL — ABNORMAL HIGH (ref 70–99)
Glucose-Capillary: 321 mg/dL — ABNORMAL HIGH (ref 70–99)
Glucose-Capillary: 84 mg/dL (ref 70–99)
Glucose-Capillary: 88 mg/dL (ref 70–99)

## 2011-02-03 LAB — CBC
HCT: 32.5 % — ABNORMAL LOW (ref 36.0–46.0)
HCT: 36.4 % (ref 36.0–46.0)
HCT: 36.5 % (ref 36.0–46.0)
HCT: 37.4 % (ref 36.0–46.0)
HCT: 37.7 % (ref 36.0–46.0)
Hemoglobin: 11.1 g/dL — ABNORMAL LOW (ref 12.0–15.0)
Hemoglobin: 11.5 g/dL — ABNORMAL LOW (ref 12.0–15.0)
Hemoglobin: 11.7 g/dL — ABNORMAL LOW (ref 12.0–15.0)
Hemoglobin: 11.7 g/dL — ABNORMAL LOW (ref 12.0–15.0)
Hemoglobin: 11.9 g/dL — ABNORMAL LOW (ref 12.0–15.0)
Hemoglobin: 12 g/dL (ref 12.0–15.0)
Hemoglobin: 12.3 g/dL (ref 12.0–15.0)
MCHC: 32.7 g/dL (ref 30.0–36.0)
MCHC: 32.8 g/dL (ref 30.0–36.0)
MCHC: 33.1 g/dL (ref 30.0–36.0)
MCHC: 33.1 g/dL (ref 30.0–36.0)
MCHC: 33.4 g/dL (ref 30.0–36.0)
MCHC: 33.6 g/dL (ref 30.0–36.0)
MCHC: 34.1 g/dL (ref 30.0–36.0)
MCV: 95.5 fL (ref 78.0–100.0)
MCV: 95.6 fL (ref 78.0–100.0)
MCV: 97.4 fL (ref 78.0–100.0)
MCV: 97.5 fL (ref 78.0–100.0)
Platelets: 223 10*3/uL (ref 150–400)
Platelets: 244 10*3/uL (ref 150–400)
Platelets: 247 10*3/uL (ref 150–400)
Platelets: 258 K/uL (ref 150–400)
RBC: 3.41 MIL/uL — ABNORMAL LOW (ref 3.87–5.11)
RBC: 3.61 MIL/uL — ABNORMAL LOW (ref 3.87–5.11)
RBC: 3.63 MIL/uL — ABNORMAL LOW (ref 3.87–5.11)
RBC: 3.66 MIL/uL — ABNORMAL LOW (ref 3.87–5.11)
RBC: 3.71 MIL/uL — ABNORMAL LOW (ref 3.87–5.11)
RBC: 3.73 MIL/uL — ABNORMAL LOW (ref 3.87–5.11)
RDW: 18.8 % — ABNORMAL HIGH (ref 11.5–15.5)
RDW: 18.8 % — ABNORMAL HIGH (ref 11.5–15.5)
RDW: 19 % — ABNORMAL HIGH (ref 11.5–15.5)
RDW: 19.1 % — ABNORMAL HIGH (ref 11.5–15.5)
RDW: 19.4 % — ABNORMAL HIGH (ref 11.5–15.5)
RDW: 19.6 % — ABNORMAL HIGH (ref 11.5–15.5)
WBC: 11.4 10*3/uL — ABNORMAL HIGH (ref 4.0–10.5)
WBC: 12.1 K/uL — ABNORMAL HIGH (ref 4.0–10.5)
WBC: 13.6 10*3/uL — ABNORMAL HIGH (ref 4.0–10.5)
WBC: 18.1 10*3/uL — ABNORMAL HIGH (ref 4.0–10.5)
WBC: 8.1 10*3/uL (ref 4.0–10.5)

## 2011-02-03 LAB — RENAL FUNCTION PANEL
Albumin: 2.4 g/dL — ABNORMAL LOW (ref 3.5–5.2)
Albumin: 2.5 g/dL — ABNORMAL LOW (ref 3.5–5.2)
Albumin: 2.7 g/dL — ABNORMAL LOW (ref 3.5–5.2)
Albumin: 2.7 g/dL — ABNORMAL LOW (ref 3.5–5.2)
BUN: 22 mg/dL (ref 6–23)
BUN: 27 mg/dL — ABNORMAL HIGH (ref 6–23)
BUN: 41 mg/dL — ABNORMAL HIGH (ref 6–23)
CO2: 18 mEq/L — ABNORMAL LOW (ref 19–32)
CO2: 19 mEq/L (ref 19–32)
CO2: 19 mEq/L (ref 19–32)
CO2: 20 mEq/L (ref 19–32)
Calcium: 9.3 mg/dL (ref 8.4–10.5)
Calcium: 9.6 mg/dL (ref 8.4–10.5)
Calcium: 9.7 mg/dL (ref 8.4–10.5)
Calcium: 9.8 mg/dL (ref 8.4–10.5)
Chloride: 89 mEq/L — ABNORMAL LOW (ref 96–112)
Chloride: 94 mEq/L — ABNORMAL LOW (ref 96–112)
Chloride: 97 mEq/L (ref 96–112)
Chloride: 98 mEq/L (ref 96–112)
Creatinine, Ser: 5.97 mg/dL — ABNORMAL HIGH (ref 0.4–1.2)
Creatinine, Ser: 6.19 mg/dL — ABNORMAL HIGH (ref 0.4–1.2)
GFR calc Af Amer: 10 mL/min — ABNORMAL LOW (ref >60)
GFR calc Af Amer: 12 mL/min — ABNORMAL LOW (ref >60)
GFR calc Af Amer: 12 mL/min — ABNORMAL LOW (ref >60)
GFR calc Af Amer: 8 mL/min — ABNORMAL LOW (ref >60)
GFR calc Af Amer: 8 mL/min — ABNORMAL LOW (ref >60)
GFR calc non Af Amer: 10 mL/min — ABNORMAL LOW (ref >60)
GFR calc non Af Amer: 10 mL/min — ABNORMAL LOW (ref >60)
GFR calc non Af Amer: 7 mL/min — ABNORMAL LOW (ref >60)
GFR calc non Af Amer: 7 mL/min — ABNORMAL LOW (ref >60)
GFR calc non Af Amer: 8 mL/min — ABNORMAL LOW (ref >60)
Glucose, Bld: 128 mg/dL — ABNORMAL HIGH (ref 70–99)
Glucose, Bld: 188 mg/dL — ABNORMAL HIGH (ref 70–99)
Glucose, Bld: 194 mg/dL — ABNORMAL HIGH (ref 70–99)
Phosphorus: 3.3 mg/dL (ref 2.3–4.6)
Phosphorus: 4.3 mg/dL (ref 2.3–4.6)
Phosphorus: 4.4 mg/dL (ref 2.3–4.6)
Phosphorus: 4.7 mg/dL — ABNORMAL HIGH (ref 2.3–4.6)
Potassium: 3.9 mEq/L (ref 3.5–5.1)
Potassium: 5 mEq/L (ref 3.5–5.1)
Potassium: 5.6 mEq/L — ABNORMAL HIGH (ref 3.5–5.1)
Sodium: 122 mEq/L — ABNORMAL LOW (ref 135–145)
Sodium: 122 mEq/L — ABNORMAL LOW (ref 135–145)
Sodium: 123 mEq/L — ABNORMAL LOW (ref 135–145)

## 2011-02-03 LAB — COMPREHENSIVE METABOLIC PANEL WITH GFR
ALT: 42 U/L — ABNORMAL HIGH (ref 0–35)
AST: 33 U/L (ref 0–37)
Albumin: 2.9 g/dL — ABNORMAL LOW (ref 3.5–5.2)
Alkaline Phosphatase: 210 U/L — ABNORMAL HIGH (ref 39–117)
BUN: 13 mg/dL (ref 6–23)
CO2: 27 meq/L (ref 19–32)
Calcium: 9.5 mg/dL (ref 8.4–10.5)
Chloride: 90 meq/L — ABNORMAL LOW (ref 96–112)
Creatinine, Ser: 3.23 mg/dL — ABNORMAL HIGH (ref 0.4–1.2)
GFR calc non Af Amer: 14 mL/min — ABNORMAL LOW
Glucose, Bld: 107 mg/dL — ABNORMAL HIGH (ref 70–99)
Potassium: 4.5 meq/L (ref 3.5–5.1)
Sodium: 127 meq/L — ABNORMAL LOW (ref 135–145)
Total Bilirubin: 0.6 mg/dL (ref 0.3–1.2)
Total Protein: 8 g/dL (ref 6.0–8.3)

## 2011-02-03 LAB — COMPREHENSIVE METABOLIC PANEL
Albumin: 2.6 g/dL — ABNORMAL LOW (ref 3.5–5.2)
BUN: 16 mg/dL (ref 6–23)
Creatinine, Ser: 3.7 mg/dL — ABNORMAL HIGH (ref 0.4–1.2)
Glucose, Bld: 127 mg/dL — ABNORMAL HIGH (ref 70–99)
Total Bilirubin: 0.5 mg/dL (ref 0.3–1.2)
Total Protein: 6.7 g/dL (ref 6.0–8.3)

## 2011-02-03 LAB — BASIC METABOLIC PANEL
CO2: 23 mEq/L (ref 19–32)
Calcium: 9.7 mg/dL (ref 8.4–10.5)
Chloride: 89 mEq/L — ABNORMAL LOW (ref 96–112)
GFR calc Af Amer: 11 mL/min — ABNORMAL LOW (ref >60)
Sodium: 124 mEq/L — ABNORMAL LOW (ref 135–145)

## 2011-02-03 LAB — CORTISOL: Cortisol, Plasma: 8.4 ug/dL

## 2011-02-03 LAB — PREALBUMIN: Prealbumin: 20.8 mg/dL (ref 18.0–45.0)

## 2011-02-03 LAB — PHOSPHORUS: Phosphorus: 3 mg/dL (ref 2.3–4.6)

## 2011-02-03 LAB — HEPATITIS B SURFACE ANTIGEN: Hepatitis B Surface Ag: NEGATIVE

## 2011-02-03 LAB — OSMOLALITY: Osmolality: 265 mOsm/kg — ABNORMAL LOW (ref 275–300)

## 2011-02-03 LAB — TSH: TSH: 3.153 u[IU]/mL (ref 0.350–4.500)

## 2011-02-04 ENCOUNTER — Ambulatory Visit: Payer: Medicare Other | Admitting: Physical Therapy

## 2011-02-04 LAB — CLOSTRIDIUM DIFFICILE EIA: C difficile Toxins A+B, EIA: NEGATIVE

## 2011-02-04 LAB — GLUCOSE, CAPILLARY
Glucose-Capillary: 109 mg/dL — ABNORMAL HIGH (ref 70–99)
Glucose-Capillary: 115 mg/dL — ABNORMAL HIGH (ref 70–99)
Glucose-Capillary: 124 mg/dL — ABNORMAL HIGH (ref 70–99)
Glucose-Capillary: 134 mg/dL — ABNORMAL HIGH (ref 70–99)
Glucose-Capillary: 136 mg/dL — ABNORMAL HIGH (ref 70–99)
Glucose-Capillary: 138 mg/dL — ABNORMAL HIGH (ref 70–99)
Glucose-Capillary: 141 mg/dL — ABNORMAL HIGH (ref 70–99)
Glucose-Capillary: 144 mg/dL — ABNORMAL HIGH (ref 70–99)
Glucose-Capillary: 144 mg/dL — ABNORMAL HIGH (ref 70–99)
Glucose-Capillary: 149 mg/dL — ABNORMAL HIGH (ref 70–99)
Glucose-Capillary: 153 mg/dL — ABNORMAL HIGH (ref 70–99)
Glucose-Capillary: 154 mg/dL — ABNORMAL HIGH (ref 70–99)
Glucose-Capillary: 154 mg/dL — ABNORMAL HIGH (ref 70–99)
Glucose-Capillary: 155 mg/dL — ABNORMAL HIGH (ref 70–99)
Glucose-Capillary: 156 mg/dL — ABNORMAL HIGH (ref 70–99)
Glucose-Capillary: 157 mg/dL — ABNORMAL HIGH (ref 70–99)
Glucose-Capillary: 158 mg/dL — ABNORMAL HIGH (ref 70–99)
Glucose-Capillary: 167 mg/dL — ABNORMAL HIGH (ref 70–99)
Glucose-Capillary: 168 mg/dL — ABNORMAL HIGH (ref 70–99)
Glucose-Capillary: 169 mg/dL — ABNORMAL HIGH (ref 70–99)
Glucose-Capillary: 173 mg/dL — ABNORMAL HIGH (ref 70–99)
Glucose-Capillary: 173 mg/dL — ABNORMAL HIGH (ref 70–99)
Glucose-Capillary: 174 mg/dL — ABNORMAL HIGH (ref 70–99)
Glucose-Capillary: 177 mg/dL — ABNORMAL HIGH (ref 70–99)
Glucose-Capillary: 178 mg/dL — ABNORMAL HIGH (ref 70–99)
Glucose-Capillary: 181 mg/dL — ABNORMAL HIGH (ref 70–99)
Glucose-Capillary: 206 mg/dL — ABNORMAL HIGH (ref 70–99)
Glucose-Capillary: 207 mg/dL — ABNORMAL HIGH (ref 70–99)
Glucose-Capillary: 208 mg/dL — ABNORMAL HIGH (ref 70–99)
Glucose-Capillary: 215 mg/dL — ABNORMAL HIGH (ref 70–99)
Glucose-Capillary: 221 mg/dL — ABNORMAL HIGH (ref 70–99)
Glucose-Capillary: 224 mg/dL — ABNORMAL HIGH (ref 70–99)
Glucose-Capillary: 225 mg/dL — ABNORMAL HIGH (ref 70–99)
Glucose-Capillary: 228 mg/dL — ABNORMAL HIGH (ref 70–99)
Glucose-Capillary: 242 mg/dL — ABNORMAL HIGH (ref 70–99)
Glucose-Capillary: 265 mg/dL — ABNORMAL HIGH (ref 70–99)
Glucose-Capillary: 266 mg/dL — ABNORMAL HIGH (ref 70–99)
Glucose-Capillary: 274 mg/dL — ABNORMAL HIGH (ref 70–99)
Glucose-Capillary: 276 mg/dL — ABNORMAL HIGH (ref 70–99)
Glucose-Capillary: 289 mg/dL — ABNORMAL HIGH (ref 70–99)
Glucose-Capillary: 331 mg/dL — ABNORMAL HIGH (ref 70–99)
Glucose-Capillary: 339 mg/dL — ABNORMAL HIGH (ref 70–99)
Glucose-Capillary: 79 mg/dL (ref 70–99)
Glucose-Capillary: 88 mg/dL (ref 70–99)
Glucose-Capillary: 113 mg/dL — ABNORMAL HIGH (ref 70–99)
Glucose-Capillary: 127 mg/dL — ABNORMAL HIGH (ref 70–99)
Glucose-Capillary: 135 mg/dL — ABNORMAL HIGH (ref 70–99)
Glucose-Capillary: 136 mg/dL — ABNORMAL HIGH (ref 70–99)
Glucose-Capillary: 141 mg/dL — ABNORMAL HIGH (ref 70–99)
Glucose-Capillary: 144 mg/dL — ABNORMAL HIGH (ref 70–99)
Glucose-Capillary: 147 mg/dL — ABNORMAL HIGH (ref 70–99)
Glucose-Capillary: 149 mg/dL — ABNORMAL HIGH (ref 70–99)
Glucose-Capillary: 154 mg/dL — ABNORMAL HIGH (ref 70–99)
Glucose-Capillary: 155 mg/dL — ABNORMAL HIGH (ref 70–99)
Glucose-Capillary: 157 mg/dL — ABNORMAL HIGH (ref 70–99)
Glucose-Capillary: 161 mg/dL — ABNORMAL HIGH (ref 70–99)
Glucose-Capillary: 181 mg/dL — ABNORMAL HIGH (ref 70–99)
Glucose-Capillary: 100 mg/dL — ABNORMAL HIGH (ref 70–99)
Glucose-Capillary: 100 mg/dL — ABNORMAL HIGH (ref 70–99)
Glucose-Capillary: 103 mg/dL — ABNORMAL HIGH (ref 70–99)
Glucose-Capillary: 104 mg/dL — ABNORMAL HIGH (ref 70–99)
Glucose-Capillary: 109 mg/dL — ABNORMAL HIGH (ref 70–99)
Glucose-Capillary: 118 mg/dL — ABNORMAL HIGH (ref 70–99)
Glucose-Capillary: 121 mg/dL — ABNORMAL HIGH (ref 70–99)
Glucose-Capillary: 122 mg/dL — ABNORMAL HIGH (ref 70–99)
Glucose-Capillary: 127 mg/dL — ABNORMAL HIGH (ref 70–99)
Glucose-Capillary: 128 mg/dL — ABNORMAL HIGH (ref 70–99)
Glucose-Capillary: 128 mg/dL — ABNORMAL HIGH (ref 70–99)
Glucose-Capillary: 133 mg/dL — ABNORMAL HIGH (ref 70–99)
Glucose-Capillary: 138 mg/dL — ABNORMAL HIGH (ref 70–99)
Glucose-Capillary: 140 mg/dL — ABNORMAL HIGH (ref 70–99)
Glucose-Capillary: 142 mg/dL — ABNORMAL HIGH (ref 70–99)
Glucose-Capillary: 142 mg/dL — ABNORMAL HIGH (ref 70–99)
Glucose-Capillary: 148 mg/dL — ABNORMAL HIGH (ref 70–99)
Glucose-Capillary: 151 mg/dL — ABNORMAL HIGH (ref 70–99)
Glucose-Capillary: 152 mg/dL — ABNORMAL HIGH (ref 70–99)
Glucose-Capillary: 153 mg/dL — ABNORMAL HIGH (ref 70–99)
Glucose-Capillary: 160 mg/dL — ABNORMAL HIGH (ref 70–99)
Glucose-Capillary: 160 mg/dL — ABNORMAL HIGH (ref 70–99)
Glucose-Capillary: 166 mg/dL — ABNORMAL HIGH (ref 70–99)
Glucose-Capillary: 169 mg/dL — ABNORMAL HIGH (ref 70–99)
Glucose-Capillary: 175 mg/dL — ABNORMAL HIGH (ref 70–99)
Glucose-Capillary: 176 mg/dL — ABNORMAL HIGH (ref 70–99)
Glucose-Capillary: 181 mg/dL — ABNORMAL HIGH (ref 70–99)
Glucose-Capillary: 181 mg/dL — ABNORMAL HIGH (ref 70–99)
Glucose-Capillary: 185 mg/dL — ABNORMAL HIGH (ref 70–99)
Glucose-Capillary: 185 mg/dL — ABNORMAL HIGH (ref 70–99)
Glucose-Capillary: 192 mg/dL — ABNORMAL HIGH (ref 70–99)
Glucose-Capillary: 210 mg/dL — ABNORMAL HIGH (ref 70–99)
Glucose-Capillary: 227 mg/dL — ABNORMAL HIGH (ref 70–99)
Glucose-Capillary: 43 mg/dL — ABNORMAL LOW (ref 70–99)
Glucose-Capillary: 64 mg/dL — ABNORMAL LOW (ref 70–99)
Glucose-Capillary: 81 mg/dL (ref 70–99)
Glucose-Capillary: 84 mg/dL (ref 70–99)
Glucose-Capillary: 85 mg/dL (ref 70–99)
Glucose-Capillary: 96 mg/dL (ref 70–99)

## 2011-02-04 LAB — CBC
HCT: 25.2 % — ABNORMAL LOW (ref 36.0–46.0)
HCT: 25.3 % — ABNORMAL LOW (ref 36.0–46.0)
HCT: 26.9 % — ABNORMAL LOW (ref 36.0–46.0)
HCT: 28.4 % — ABNORMAL LOW (ref 36.0–46.0)
HCT: 28.9 % — ABNORMAL LOW (ref 36.0–46.0)
HCT: 29.1 % — ABNORMAL LOW (ref 36.0–46.0)
HCT: 29.2 % — ABNORMAL LOW (ref 36.0–46.0)
HCT: 29.7 % — ABNORMAL LOW (ref 36.0–46.0)
HCT: 31.4 % — ABNORMAL LOW (ref 36.0–46.0)
HCT: 32.3 % — ABNORMAL LOW (ref 36.0–46.0)
HCT: 33 % — ABNORMAL LOW (ref 36.0–46.0)
HCT: 35.6 % — ABNORMAL LOW (ref 36.0–46.0)
HCT: 27 % — ABNORMAL LOW (ref 36.0–46.0)
HCT: 27.8 % — ABNORMAL LOW (ref 36.0–46.0)
HCT: 28.3 % — ABNORMAL LOW (ref 36.0–46.0)
HCT: 30.8 % — ABNORMAL LOW (ref 36.0–46.0)
HCT: 25.7 % — ABNORMAL LOW (ref 36.0–46.0)
HCT: 25.7 % — ABNORMAL LOW (ref 36.0–46.0)
HCT: 25.9 % — ABNORMAL LOW (ref 36.0–46.0)
HCT: 26.8 % — ABNORMAL LOW (ref 36.0–46.0)
HCT: 27.8 % — ABNORMAL LOW (ref 36.0–46.0)
HCT: 28.1 % — ABNORMAL LOW (ref 36.0–46.0)
HCT: 28.2 % — ABNORMAL LOW (ref 36.0–46.0)
Hemoglobin: 10.6 g/dL — ABNORMAL LOW (ref 12.0–15.0)
Hemoglobin: 10.9 g/dL — ABNORMAL LOW (ref 12.0–15.0)
Hemoglobin: 11.8 g/dL — ABNORMAL LOW (ref 12.0–15.0)
Hemoglobin: 8.4 g/dL — ABNORMAL LOW (ref 12.0–15.0)
Hemoglobin: 9.7 g/dL — ABNORMAL LOW (ref 12.0–15.0)
Hemoglobin: 9.8 g/dL — ABNORMAL LOW (ref 12.0–15.0)
Hemoglobin: 9.8 g/dL — ABNORMAL LOW (ref 12.0–15.0)
Hemoglobin: 9.9 g/dL — ABNORMAL LOW (ref 12.0–15.0)
Hemoglobin: 9 g/dL — ABNORMAL LOW (ref 12.0–15.0)
Hemoglobin: 9.3 g/dL — ABNORMAL LOW (ref 12.0–15.0)
Hemoglobin: 9.3 g/dL — ABNORMAL LOW (ref 12.0–15.0)
Hemoglobin: 9.4 g/dL — ABNORMAL LOW (ref 12.0–15.0)
Hemoglobin: 8.3 g/dL — ABNORMAL LOW (ref 12.0–15.0)
Hemoglobin: 8.8 g/dL — ABNORMAL LOW (ref 12.0–15.0)
Hemoglobin: 8.9 g/dL — ABNORMAL LOW (ref 12.0–15.0)
Hemoglobin: 9 g/dL — ABNORMAL LOW (ref 12.0–15.0)
Hemoglobin: 9.3 g/dL — ABNORMAL LOW (ref 12.0–15.0)
Hemoglobin: 9.4 g/dL — ABNORMAL LOW (ref 12.0–15.0)
MCHC: 33 g/dL (ref 30.0–36.0)
MCHC: 33.1 g/dL (ref 30.0–36.0)
MCHC: 33.5 g/dL (ref 30.0–36.0)
MCHC: 33.5 g/dL (ref 30.0–36.0)
MCHC: 33.6 g/dL (ref 30.0–36.0)
MCHC: 33.7 g/dL (ref 30.0–36.0)
MCHC: 34 g/dL (ref 30.0–36.0)
MCHC: 33 g/dL (ref 30.0–36.0)
MCHC: 33.2 g/dL (ref 30.0–36.0)
MCHC: 33.9 g/dL (ref 30.0–36.0)
MCHC: 33.7 g/dL (ref 30.0–36.0)
MCV: 95 fL (ref 78.0–100.0)
MCV: 95.8 fL (ref 78.0–100.0)
MCV: 95.9 fL (ref 78.0–100.0)
MCV: 96.1 fL (ref 78.0–100.0)
MCV: 96.1 fL (ref 78.0–100.0)
MCV: 96.1 fL (ref 78.0–100.0)
MCV: 96.9 fL (ref 78.0–100.0)
MCV: 97.3 fL (ref 78.0–100.0)
MCV: 97.4 fL (ref 78.0–100.0)
MCV: 91.9 fL (ref 78.0–100.0)
MCV: 93.7 fL (ref 78.0–100.0)
MCV: 90.9 fL (ref 78.0–100.0)
MCV: 92.3 fL (ref 78.0–100.0)
MCV: 92.4 fL (ref 78.0–100.0)
MCV: 92.9 fL (ref 78.0–100.0)
MCV: 93.3 fL (ref 78.0–100.0)
Platelets: 140 10*3/uL — ABNORMAL LOW (ref 150–400)
Platelets: 172 10*3/uL (ref 150–400)
Platelets: 177 10*3/uL (ref 150–400)
Platelets: 181 10*3/uL (ref 150–400)
Platelets: 203 10*3/uL (ref 150–400)
Platelets: 214 10*3/uL (ref 150–400)
Platelets: 222 10*3/uL (ref 150–400)
Platelets: 230 10*3/uL (ref 150–400)
Platelets: 346 10*3/uL (ref 150–400)
Platelets: 197 10*3/uL (ref 150–400)
Platelets: 211 10*3/uL (ref 150–400)
Platelets: 211 10*3/uL (ref 150–400)
Platelets: 215 10*3/uL (ref 150–400)
Platelets: 258 10*3/uL (ref 150–400)
Platelets: 296 10*3/uL (ref 150–400)
RBC: 2.99 MIL/uL — ABNORMAL LOW (ref 3.87–5.11)
RBC: 3.06 MIL/uL — ABNORMAL LOW (ref 3.87–5.11)
RBC: 3.07 MIL/uL — ABNORMAL LOW (ref 3.87–5.11)
RBC: 3.09 MIL/uL — ABNORMAL LOW (ref 3.87–5.11)
RBC: 3.23 MIL/uL — ABNORMAL LOW (ref 3.87–5.11)
RBC: 3.36 MIL/uL — ABNORMAL LOW (ref 3.87–5.11)
RBC: 3.66 MIL/uL — ABNORMAL LOW (ref 3.87–5.11)
RBC: 2.97 MIL/uL — ABNORMAL LOW (ref 3.87–5.11)
RBC: 3 MIL/uL — ABNORMAL LOW (ref 3.87–5.11)
RBC: 3.06 MIL/uL — ABNORMAL LOW (ref 3.87–5.11)
RBC: 3.3 MIL/uL — ABNORMAL LOW (ref 3.87–5.11)
RBC: 2.78 MIL/uL — ABNORMAL LOW (ref 3.87–5.11)
RBC: 2.78 MIL/uL — ABNORMAL LOW (ref 3.87–5.11)
RBC: 2.9 MIL/uL — ABNORMAL LOW (ref 3.87–5.11)
RBC: 2.92 MIL/uL — ABNORMAL LOW (ref 3.87–5.11)
RBC: 2.99 MIL/uL — ABNORMAL LOW (ref 3.87–5.11)
RBC: 3.03 MIL/uL — ABNORMAL LOW (ref 3.87–5.11)
RBC: 3.04 MIL/uL — ABNORMAL LOW (ref 3.87–5.11)
RDW: 15.8 % — ABNORMAL HIGH (ref 11.5–15.5)
RDW: 17.8 % — ABNORMAL HIGH (ref 11.5–15.5)
RDW: 19.7 % — ABNORMAL HIGH (ref 11.5–15.5)
RDW: 19.7 % — ABNORMAL HIGH (ref 11.5–15.5)
RDW: 20 % — ABNORMAL HIGH (ref 11.5–15.5)
RDW: 20 % — ABNORMAL HIGH (ref 11.5–15.5)
RDW: 20.3 % — ABNORMAL HIGH (ref 11.5–15.5)
RDW: 20.4 % — ABNORMAL HIGH (ref 11.5–15.5)
RDW: 20.7 % — ABNORMAL HIGH (ref 11.5–15.5)
RDW: 14.7 % (ref 11.5–15.5)
RDW: 14.9 % (ref 11.5–15.5)
RDW: 15.1 % (ref 11.5–15.5)
RDW: 14.6 % (ref 11.5–15.5)
RDW: 15 % (ref 11.5–15.5)
RDW: 15.6 % — ABNORMAL HIGH (ref 11.5–15.5)
RDW: 15.6 % — ABNORMAL HIGH (ref 11.5–15.5)
RDW: 15.6 % — ABNORMAL HIGH (ref 11.5–15.5)
WBC: 10.9 10*3/uL — ABNORMAL HIGH (ref 4.0–10.5)
WBC: 11.5 10*3/uL — ABNORMAL HIGH (ref 4.0–10.5)
WBC: 12.1 10*3/uL — ABNORMAL HIGH (ref 4.0–10.5)
WBC: 12.4 10*3/uL — ABNORMAL HIGH (ref 4.0–10.5)
WBC: 14.3 10*3/uL — ABNORMAL HIGH (ref 4.0–10.5)
WBC: 14.9 10*3/uL — ABNORMAL HIGH (ref 4.0–10.5)
WBC: 15.6 10*3/uL — ABNORMAL HIGH (ref 4.0–10.5)
WBC: 15.6 10*3/uL — ABNORMAL HIGH (ref 4.0–10.5)
WBC: 18.9 10*3/uL — ABNORMAL HIGH (ref 4.0–10.5)
WBC: 11.6 10*3/uL — ABNORMAL HIGH (ref 4.0–10.5)
WBC: 16.9 10*3/uL — ABNORMAL HIGH (ref 4.0–10.5)
WBC: 13.2 10*3/uL — ABNORMAL HIGH (ref 4.0–10.5)
WBC: 13.4 10*3/uL — ABNORMAL HIGH (ref 4.0–10.5)
WBC: 18 10*3/uL — ABNORMAL HIGH (ref 4.0–10.5)
WBC: 18.3 10*3/uL — ABNORMAL HIGH (ref 4.0–10.5)
WBC: 18.3 10*3/uL — ABNORMAL HIGH (ref 4.0–10.5)
WBC: 18.9 10*3/uL — ABNORMAL HIGH (ref 4.0–10.5)
WBC: 19.4 10*3/uL — ABNORMAL HIGH (ref 4.0–10.5)

## 2011-02-04 LAB — RENAL FUNCTION PANEL
Albumin: 1.9 g/dL — ABNORMAL LOW (ref 3.5–5.2)
Albumin: 2 g/dL — ABNORMAL LOW (ref 3.5–5.2)
Albumin: 2.2 g/dL — ABNORMAL LOW (ref 3.5–5.2)
Albumin: 2.2 g/dL — ABNORMAL LOW (ref 3.5–5.2)
BUN: 22 mg/dL (ref 6–23)
BUN: 22 mg/dL (ref 6–23)
BUN: 24 mg/dL — ABNORMAL HIGH (ref 6–23)
BUN: 42 mg/dL — ABNORMAL HIGH (ref 6–23)
BUN: 63 mg/dL — ABNORMAL HIGH (ref 6–23)
BUN: 7 mg/dL (ref 6–23)
BUN: 48 mg/dL — ABNORMAL HIGH (ref 6–23)
BUN: 49 mg/dL — ABNORMAL HIGH (ref 6–23)
CO2: 23 mEq/L (ref 19–32)
CO2: 23 mEq/L (ref 19–32)
CO2: 23 mEq/L (ref 19–32)
CO2: 26 mEq/L (ref 19–32)
CO2: 27 mEq/L (ref 19–32)
CO2: 29 mEq/L (ref 19–32)
CO2: 24 mEq/L (ref 19–32)
CO2: 25 mEq/L (ref 19–32)
Calcium: 8.6 mg/dL (ref 8.4–10.5)
Calcium: 9.1 mg/dL (ref 8.4–10.5)
Calcium: 9.3 mg/dL (ref 8.4–10.5)
Calcium: 9.5 mg/dL (ref 8.4–10.5)
Calcium: 8 mg/dL — ABNORMAL LOW (ref 8.4–10.5)
Chloride: 104 mEq/L (ref 96–112)
Chloride: 92 mEq/L — ABNORMAL LOW (ref 96–112)
Chloride: 94 mEq/L — ABNORMAL LOW (ref 96–112)
Chloride: 96 mEq/L (ref 96–112)
Chloride: 104 mEq/L (ref 96–112)
Creatinine, Ser: 2.56 mg/dL — ABNORMAL HIGH (ref 0.4–1.2)
Creatinine, Ser: 3.07 mg/dL — ABNORMAL HIGH (ref 0.4–1.2)
Creatinine, Ser: 4.35 mg/dL — ABNORMAL HIGH (ref 0.4–1.2)
Creatinine, Ser: 4.71 mg/dL — ABNORMAL HIGH (ref 0.4–1.2)
Creatinine, Ser: 4.71 mg/dL — ABNORMAL HIGH (ref 0.4–1.2)
Creatinine, Ser: 4.97 mg/dL — ABNORMAL HIGH (ref 0.4–1.2)
Creatinine, Ser: 3.79 mg/dL — ABNORMAL HIGH (ref 0.4–1.2)
Creatinine, Ser: 3.14 mg/dL — ABNORMAL HIGH (ref 0.4–1.2)
GFR calc Af Amer: 12 mL/min — ABNORMAL LOW (ref >60)
GFR calc Af Amer: 18 mL/min — ABNORMAL LOW (ref >60)
GFR calc Af Amer: 22 mL/min — ABNORMAL LOW (ref >60)
GFR calc non Af Amer: 10 mL/min — ABNORMAL LOW (ref >60)
GFR calc non Af Amer: 15 mL/min — ABNORMAL LOW (ref >60)
GFR calc non Af Amer: 18 mL/min — ABNORMAL LOW (ref >60)
GFR calc non Af Amer: 12 mL/min — ABNORMAL LOW (ref >60)
Glucose, Bld: 121 mg/dL — ABNORMAL HIGH (ref 70–99)
Glucose, Bld: 144 mg/dL — ABNORMAL HIGH (ref 70–99)
Glucose, Bld: 151 mg/dL — ABNORMAL HIGH (ref 70–99)
Glucose, Bld: 153 mg/dL — ABNORMAL HIGH (ref 70–99)
Glucose, Bld: 174 mg/dL — ABNORMAL HIGH (ref 70–99)
Glucose, Bld: 219 mg/dL — ABNORMAL HIGH (ref 70–99)
Glucose, Bld: 230 mg/dL — ABNORMAL HIGH (ref 70–99)
Glucose, Bld: 214 mg/dL — ABNORMAL HIGH (ref 70–99)
Phosphorus: 1.5 mg/dL — ABNORMAL LOW (ref 2.3–4.6)
Phosphorus: 2.2 mg/dL — ABNORMAL LOW (ref 2.3–4.6)
Phosphorus: 2.5 mg/dL (ref 2.3–4.6)
Phosphorus: 2.9 mg/dL (ref 2.3–4.6)
Phosphorus: 3.4 mg/dL (ref 2.3–4.6)
Potassium: 4 mEq/L (ref 3.5–5.1)
Potassium: 4.1 mEq/L (ref 3.5–5.1)
Potassium: 4.2 mEq/L (ref 3.5–5.1)
Potassium: 4.2 mEq/L (ref 3.5–5.1)
Potassium: 4.4 mEq/L (ref 3.5–5.1)
Potassium: 4.4 mEq/L (ref 3.5–5.1)
Potassium: 4 mEq/L (ref 3.5–5.1)
Sodium: 129 mEq/L — ABNORMAL LOW (ref 135–145)
Sodium: 130 mEq/L — ABNORMAL LOW (ref 135–145)
Sodium: 132 mEq/L — ABNORMAL LOW (ref 135–145)
Sodium: 136 mEq/L (ref 135–145)
Sodium: 138 mEq/L (ref 135–145)

## 2011-02-04 LAB — POCT I-STAT EG7
Acid-base deficit: 3 mmol/L — ABNORMAL HIGH (ref 0.0–2.0)
Calcium, Ion: 1.28 mmol/L (ref 1.12–1.32)
HCT: 38 % (ref 36.0–46.0)
Hemoglobin: 12.9 g/dL (ref 12.0–15.0)
Patient temperature: 37
pCO2, Ven: 88.7 mmHg (ref 45.0–50.0)
pH, Ven: 7.116 — CL (ref 7.250–7.300)
pO2, Ven: 46 mmHg — ABNORMAL HIGH (ref 30.0–45.0)

## 2011-02-04 LAB — BASIC METABOLIC PANEL
BUN: 22 mg/dL (ref 6–23)
BUN: 28 mg/dL — ABNORMAL HIGH (ref 6–23)
BUN: 59 mg/dL — ABNORMAL HIGH (ref 6–23)
BUN: 54 mg/dL — ABNORMAL HIGH (ref 6–23)
CO2: 24 mEq/L (ref 19–32)
CO2: 25 mEq/L (ref 19–32)
CO2: 28 mEq/L (ref 19–32)
CO2: 22 mEq/L (ref 19–32)
CO2: 24 mEq/L (ref 19–32)
CO2: 25 mEq/L (ref 19–32)
Calcium: 7.7 mg/dL — ABNORMAL LOW (ref 8.4–10.5)
Calcium: 8.3 mg/dL — ABNORMAL LOW (ref 8.4–10.5)
Calcium: 8.8 mg/dL (ref 8.4–10.5)
Calcium: 9 mg/dL (ref 8.4–10.5)
Calcium: 7.9 mg/dL — ABNORMAL LOW (ref 8.4–10.5)
Calcium: 7.8 mg/dL — ABNORMAL LOW (ref 8.4–10.5)
Calcium: 7.9 mg/dL — ABNORMAL LOW (ref 8.4–10.5)
Chloride: 96 mEq/L (ref 96–112)
Chloride: 96 mEq/L (ref 96–112)
Chloride: 104 mEq/L (ref 96–112)
Chloride: 110 mEq/L (ref 96–112)
Chloride: 98 mEq/L (ref 96–112)
Chloride: 99 mEq/L (ref 96–112)
Creatinine, Ser: 2.57 mg/dL — ABNORMAL HIGH (ref 0.4–1.2)
Creatinine, Ser: 3.46 mg/dL — ABNORMAL HIGH (ref 0.4–1.2)
Creatinine, Ser: 3.84 mg/dL — ABNORMAL HIGH (ref 0.4–1.2)
Creatinine, Ser: 2.19 mg/dL — ABNORMAL HIGH (ref 0.4–1.2)
GFR calc Af Amer: 13 mL/min — ABNORMAL LOW (ref >60)
GFR calc Af Amer: 14 mL/min — ABNORMAL LOW (ref >60)
GFR calc Af Amer: 19 mL/min — ABNORMAL LOW (ref >60)
GFR calc Af Amer: 27 mL/min — ABNORMAL LOW (ref >60)
GFR calc Af Amer: 11 mL/min — ABNORMAL LOW (ref >60)
GFR calc Af Amer: 13 mL/min — ABNORMAL LOW (ref >60)
GFR calc Af Amer: 17 mL/min — ABNORMAL LOW (ref >60)
GFR calc non Af Amer: 12 mL/min — ABNORMAL LOW (ref >60)
GFR calc non Af Amer: 13 mL/min — ABNORMAL LOW (ref >60)
GFR calc non Af Amer: 18 mL/min — ABNORMAL LOW (ref >60)
GFR calc non Af Amer: 16 mL/min — ABNORMAL LOW (ref >60)
GFR calc non Af Amer: 10 mL/min — ABNORMAL LOW (ref >60)
GFR calc non Af Amer: 11 mL/min — ABNORMAL LOW (ref >60)
Glucose, Bld: 149 mg/dL — ABNORMAL HIGH (ref 70–99)
Glucose, Bld: 157 mg/dL — ABNORMAL HIGH (ref 70–99)
Glucose, Bld: 168 mg/dL — ABNORMAL HIGH (ref 70–99)
Glucose, Bld: 203 mg/dL — ABNORMAL HIGH (ref 70–99)
Glucose, Bld: 118 mg/dL — ABNORMAL HIGH (ref 70–99)
Glucose, Bld: 145 mg/dL — ABNORMAL HIGH (ref 70–99)
Glucose, Bld: 150 mg/dL — ABNORMAL HIGH (ref 70–99)
Glucose, Bld: 148 mg/dL — ABNORMAL HIGH (ref 70–99)
Potassium: 4 mEq/L (ref 3.5–5.1)
Potassium: 4.5 mEq/L (ref 3.5–5.1)
Potassium: 4.1 mEq/L (ref 3.5–5.1)
Potassium: 4.6 mEq/L (ref 3.5–5.1)
Potassium: 3.8 mEq/L (ref 3.5–5.1)
Potassium: 4 mEq/L (ref 3.5–5.1)
Potassium: 5 mEq/L (ref 3.5–5.1)
Sodium: 129 mEq/L — ABNORMAL LOW (ref 135–145)
Sodium: 130 mEq/L — ABNORMAL LOW (ref 135–145)
Sodium: 136 mEq/L (ref 135–145)
Sodium: 138 mEq/L (ref 135–145)
Sodium: 130 mEq/L — ABNORMAL LOW (ref 135–145)
Sodium: 131 mEq/L — ABNORMAL LOW (ref 135–145)
Sodium: 131 mEq/L — ABNORMAL LOW (ref 135–145)
Sodium: 137 mEq/L (ref 135–145)
Sodium: 141 mEq/L (ref 135–145)

## 2011-02-04 LAB — CARDIAC PANEL(CRET KIN+CKTOT+MB+TROPI)
CK, MB: 1.4 ng/mL (ref 0.3–4.0)
Relative Index: INVALID (ref 0.0–2.5)
Relative Index: INVALID (ref 0.0–2.5)
Total CK: 12 U/L (ref 7–177)
Total CK: 8 U/L (ref 7–177)

## 2011-02-04 LAB — COMPREHENSIVE METABOLIC PANEL
ALT: 19 U/L (ref 0–35)
ALT: 15 U/L (ref 0–35)
ALT: 36 U/L — ABNORMAL HIGH (ref 0–35)
ALT: 39 U/L — ABNORMAL HIGH (ref 0–35)
AST: 22 U/L (ref 0–37)
AST: 24 U/L (ref 0–37)
AST: 25 U/L (ref 0–37)
AST: 38 U/L — ABNORMAL HIGH (ref 0–37)
Albumin: 1.9 g/dL — ABNORMAL LOW (ref 3.5–5.2)
Albumin: 2.1 g/dL — ABNORMAL LOW (ref 3.5–5.2)
Albumin: 2.3 g/dL — ABNORMAL LOW (ref 3.5–5.2)
Albumin: 1.2 g/dL — ABNORMAL LOW (ref 3.5–5.2)
Albumin: 1.1 g/dL — ABNORMAL LOW (ref 3.5–5.2)
Albumin: 1.1 g/dL — ABNORMAL LOW (ref 3.5–5.2)
Albumin: 1.1 g/dL — ABNORMAL LOW (ref 3.5–5.2)
Albumin: 1.7 g/dL — ABNORMAL LOW (ref 3.5–5.2)
Alkaline Phosphatase: 137 U/L — ABNORMAL HIGH (ref 39–117)
Alkaline Phosphatase: 204 U/L — ABNORMAL HIGH (ref 39–117)
Alkaline Phosphatase: 104 U/L (ref 39–117)
Alkaline Phosphatase: 88 U/L (ref 39–117)
Alkaline Phosphatase: 165 U/L — ABNORMAL HIGH (ref 39–117)
Alkaline Phosphatase: 178 U/L — ABNORMAL HIGH (ref 39–117)
Alkaline Phosphatase: 287 U/L — ABNORMAL HIGH (ref 39–117)
Alkaline Phosphatase: 424 U/L — ABNORMAL HIGH (ref 39–117)
BUN: 33 mg/dL — ABNORMAL HIGH (ref 6–23)
BUN: 48 mg/dL — ABNORMAL HIGH (ref 6–23)
BUN: 57 mg/dL — ABNORMAL HIGH (ref 6–23)
BUN: 48 mg/dL — ABNORMAL HIGH (ref 6–23)
BUN: 53 mg/dL — ABNORMAL HIGH (ref 6–23)
BUN: 30 mg/dL — ABNORMAL HIGH (ref 6–23)
BUN: 32 mg/dL — ABNORMAL HIGH (ref 6–23)
BUN: 74 mg/dL — ABNORMAL HIGH (ref 6–23)
CO2: 23 mEq/L (ref 19–32)
CO2: 25 mEq/L (ref 19–32)
CO2: 24 mEq/L (ref 19–32)
CO2: 25 mEq/L (ref 19–32)
CO2: 25 mEq/L (ref 19–32)
CO2: 28 mEq/L (ref 19–32)
Chloride: 104 mEq/L (ref 96–112)
Chloride: 95 mEq/L — ABNORMAL LOW (ref 96–112)
Chloride: 97 mEq/L (ref 96–112)
Chloride: 100 mEq/L (ref 96–112)
Chloride: 101 mEq/L (ref 96–112)
Chloride: 102 mEq/L (ref 96–112)
Chloride: 103 mEq/L (ref 96–112)
Creatinine, Ser: 3.4 mg/dL — ABNORMAL HIGH (ref 0.4–1.2)
Creatinine, Ser: 4.99 mg/dL — ABNORMAL HIGH (ref 0.4–1.2)
Creatinine, Ser: 5.75 mg/dL — ABNORMAL HIGH (ref 0.4–1.2)
Creatinine, Ser: 4.57 mg/dL — ABNORMAL HIGH (ref 0.4–1.2)
Creatinine, Ser: 2.74 mg/dL — ABNORMAL HIGH (ref 0.4–1.2)
Creatinine, Ser: 3.28 mg/dL — ABNORMAL HIGH (ref 0.4–1.2)
GFR calc Af Amer: 10 mL/min — ABNORMAL LOW (ref >60)
GFR calc Af Amer: 9 mL/min — ABNORMAL LOW (ref >60)
GFR calc Af Amer: 17 mL/min — ABNORMAL LOW (ref >60)
GFR calc Af Amer: 21 mL/min — ABNORMAL LOW (ref >60)
GFR calc non Af Amer: 7 mL/min — ABNORMAL LOW (ref >60)
GFR calc non Af Amer: 11 mL/min — ABNORMAL LOW (ref >60)
GFR calc non Af Amer: 14 mL/min — ABNORMAL LOW (ref >60)
GFR calc non Af Amer: 17 mL/min — ABNORMAL LOW (ref >60)
GFR calc non Af Amer: 9 mL/min — ABNORMAL LOW (ref >60)
Glucose, Bld: 154 mg/dL — ABNORMAL HIGH (ref 70–99)
Glucose, Bld: 159 mg/dL — ABNORMAL HIGH (ref 70–99)
Glucose, Bld: 100 mg/dL — ABNORMAL HIGH (ref 70–99)
Glucose, Bld: 166 mg/dL — ABNORMAL HIGH (ref 70–99)
Glucose, Bld: 244 mg/dL — ABNORMAL HIGH (ref 70–99)
Potassium: 4.7 mEq/L (ref 3.5–5.1)
Potassium: 4.9 mEq/L (ref 3.5–5.1)
Potassium: 4 mEq/L (ref 3.5–5.1)
Potassium: 4.2 mEq/L (ref 3.5–5.1)
Potassium: 3.6 mEq/L (ref 3.5–5.1)
Potassium: 4.3 mEq/L (ref 3.5–5.1)
Potassium: 4.3 mEq/L (ref 3.5–5.1)
Potassium: 4.4 mEq/L (ref 3.5–5.1)
Potassium: 5 mEq/L (ref 3.5–5.1)
Sodium: 135 mEq/L (ref 135–145)
Sodium: 133 mEq/L — ABNORMAL LOW (ref 135–145)
Sodium: 134 mEq/L — ABNORMAL LOW (ref 135–145)
Sodium: 136 mEq/L (ref 135–145)
Total Bilirubin: 0.3 mg/dL (ref 0.3–1.2)
Total Bilirubin: 0.4 mg/dL (ref 0.3–1.2)
Total Bilirubin: 0.2 mg/dL — ABNORMAL LOW (ref 0.3–1.2)
Total Bilirubin: 0.3 mg/dL (ref 0.3–1.2)
Total Bilirubin: 0.5 mg/dL (ref 0.3–1.2)
Total Bilirubin: 0.5 mg/dL (ref 0.3–1.2)
Total Protein: 5.9 g/dL — ABNORMAL LOW (ref 6.0–8.3)
Total Protein: 4.8 g/dL — ABNORMAL LOW (ref 6.0–8.3)
Total Protein: 5.1 g/dL — ABNORMAL LOW (ref 6.0–8.3)
Total Protein: 5.6 g/dL — ABNORMAL LOW (ref 6.0–8.3)
Total Protein: 5.9 g/dL — ABNORMAL LOW (ref 6.0–8.3)

## 2011-02-04 LAB — URINALYSIS, MICROSCOPIC ONLY
Glucose, UA: NEGATIVE mg/dL
Nitrite: NEGATIVE
Specific Gravity, Urine: 1.014 (ref 1.005–1.030)
pH: 5 (ref 5.0–8.0)

## 2011-02-04 LAB — PHOSPHORUS
Phosphorus: 2.1 mg/dL — ABNORMAL LOW (ref 2.3–4.6)
Phosphorus: 4.3 mg/dL (ref 2.3–4.6)
Phosphorus: 4 mg/dL (ref 2.3–4.6)
Phosphorus: 4.9 mg/dL — ABNORMAL HIGH (ref 2.3–4.6)
Phosphorus: 5.3 mg/dL — ABNORMAL HIGH (ref 2.3–4.6)
Phosphorus: 1.6 mg/dL — ABNORMAL LOW (ref 2.3–4.6)
Phosphorus: 4.4 mg/dL (ref 2.3–4.6)

## 2011-02-04 LAB — IRON AND TIBC
Saturation Ratios: 23 % (ref 20–55)
TIBC: 159 ug/dL — ABNORMAL LOW (ref 250–470)
UIBC: 130 ug/dL

## 2011-02-04 LAB — POCT I-STAT 3, ART BLOOD GAS (G3+)
Acid-Base Excess: 2 mmol/L (ref 0.0–2.0)
Acid-Base Excess: 3 mmol/L — ABNORMAL HIGH (ref 0.0–2.0)
Acid-Base Excess: 6 mmol/L — ABNORMAL HIGH (ref 0.0–2.0)
Bicarbonate: 26 mEq/L — ABNORMAL HIGH (ref 20.0–24.0)
Bicarbonate: 27.5 mEq/L — ABNORMAL HIGH (ref 20.0–24.0)
Bicarbonate: 28.1 mEq/L — ABNORMAL HIGH (ref 20.0–24.0)
Bicarbonate: 28.3 mEq/L — ABNORMAL HIGH (ref 20.0–24.0)
O2 Saturation: 99 %
O2 Saturation: 100 %
O2 Saturation: 97 %
O2 Saturation: 98 %
O2 Saturation: 99 %
O2 Saturation: 99 %
Patient temperature: 98.7
Patient temperature: 98.2
Patient temperature: 98.6
Patient temperature: 98.6
Patient temperature: 98.6
TCO2: 24 mmol/L (ref 0–100)
TCO2: 27 mmol/L (ref 0–100)
TCO2: 27 mmol/L (ref 0–100)
TCO2: 29 mmol/L (ref 0–100)
TCO2: 31 mmol/L (ref 0–100)
TCO2: 31 mmol/L (ref 0–100)
pCO2 arterial: 27.9 mmHg — ABNORMAL LOW (ref 35.0–45.0)
pCO2 arterial: 35.6 mmHg (ref 35.0–45.0)
pCO2 arterial: 38.4 mmHg (ref 35.0–45.0)
pCO2 arterial: 41.1 mmHg (ref 35.0–45.0)
pCO2 arterial: 43 mmHg (ref 35.0–45.0)
pCO2 arterial: 48.8 mmHg — ABNORMAL HIGH (ref 35.0–45.0)
pCO2 arterial: 85.4 mmHg (ref 35.0–45.0)
pH, Arterial: 7.129 — CL (ref 7.350–7.400)
pH, Arterial: 7.384 (ref 7.350–7.400)
pH, Arterial: 7.445 — ABNORMAL HIGH (ref 7.350–7.400)
pH, Arterial: 7.464 — ABNORMAL HIGH (ref 7.350–7.400)
pH, Arterial: 7.47 — ABNORMAL HIGH (ref 7.350–7.400)
pH, Arterial: 7.525 — ABNORMAL HIGH (ref 7.350–7.400)
pO2, Arterial: 113 mmHg — ABNORMAL HIGH (ref 80.0–100.0)
pO2, Arterial: 114 mmHg — ABNORMAL HIGH (ref 80.0–100.0)
pO2, Arterial: 159 mmHg — ABNORMAL HIGH (ref 80.0–100.0)
pO2, Arterial: 461 mmHg — ABNORMAL HIGH (ref 80.0–100.0)
pO2, Arterial: 48 mmHg — ABNORMAL LOW (ref 80.0–100.0)
pO2, Arterial: 91 mmHg (ref 80.0–100.0)
pO2, Arterial: 98 mmHg (ref 80.0–100.0)

## 2011-02-04 LAB — BLOOD GAS, ARTERIAL
Acid-Base Excess: 3.5 mmol/L — ABNORMAL HIGH (ref 0.0–2.0)
Acid-base deficit: 2.1 mmol/L — ABNORMAL HIGH (ref 0.0–2.0)
Drawn by: 290241
FIO2: 0.3 %
FIO2: 1 %
MECHVT: 500 mL
O2 Saturation: 99.2 %
O2 Saturation: 99.2 %
RATE: 14 resp/min
TCO2: 27.8 mmol/L (ref 0–100)
TCO2: 21.5 mmol/L (ref 0–100)
pCO2 arterial: 43.7 mmHg (ref 35.0–45.0)
pO2, Arterial: 118 mmHg — ABNORMAL HIGH (ref 80.0–100.0)
pO2, Arterial: 171 mmHg — ABNORMAL HIGH (ref 80.0–100.0)

## 2011-02-04 LAB — DIFFERENTIAL
Basophils Absolute: 0 10*3/uL (ref 0.0–0.1)
Basophils Absolute: 0 10*3/uL (ref 0.0–0.1)
Basophils Absolute: 0 10*3/uL (ref 0.0–0.1)
Basophils Absolute: 0 10*3/uL (ref 0.0–0.1)
Basophils Relative: 0 % (ref 0–1)
Basophils Relative: 0 % (ref 0–1)
Basophils Relative: 0 % (ref 0–1)
Basophils Relative: 0 % (ref 0–1)
Eosinophils Relative: 4 % (ref 0–5)
Eosinophils Relative: 4 % (ref 0–5)
Lymphocytes Relative: 10 % — ABNORMAL LOW (ref 12–46)
Lymphocytes Relative: 4 % — ABNORMAL LOW (ref 12–46)
Lymphs Abs: 1.6 10*3/uL (ref 0.7–4.0)
Monocytes Absolute: 1.4 10*3/uL — ABNORMAL HIGH (ref 0.1–1.0)
Monocytes Absolute: 1.5 10*3/uL — ABNORMAL HIGH (ref 0.1–1.0)
Monocytes Absolute: 1.3 10*3/uL — ABNORMAL HIGH (ref 0.1–1.0)
Monocytes Absolute: 1.4 10*3/uL — ABNORMAL HIGH (ref 0.1–1.0)
Monocytes Relative: 10 % (ref 3–12)
Monocytes Relative: 12 % (ref 3–12)
Monocytes Relative: 12 % (ref 3–12)
Neutro Abs: 11.5 10*3/uL — ABNORMAL HIGH (ref 1.7–7.7)
Neutro Abs: 8.9 10*3/uL — ABNORMAL HIGH (ref 1.7–7.7)
Neutro Abs: 14.9 10*3/uL — ABNORMAL HIGH (ref 1.7–7.7)
Neutrophils Relative %: 77 % (ref 43–77)
Neutrophils Relative %: 80 % — ABNORMAL HIGH (ref 43–77)

## 2011-02-04 LAB — URINE CULTURE

## 2011-02-04 LAB — CULTURE, BLOOD (ROUTINE X 2)
Culture: NO GROWTH
Culture: NO GROWTH

## 2011-02-04 LAB — HEPATIC FUNCTION PANEL
ALT: 16 U/L (ref 0–35)
Alkaline Phosphatase: 119 U/L — ABNORMAL HIGH (ref 39–117)
Bilirubin, Direct: 0.1 mg/dL (ref 0.0–0.3)
Indirect Bilirubin: 0.4 mg/dL (ref 0.3–0.9)
Total Bilirubin: 0.5 mg/dL (ref 0.3–1.2)

## 2011-02-04 LAB — URINE MICROSCOPIC-ADD ON

## 2011-02-04 LAB — URINALYSIS, ROUTINE W REFLEX MICROSCOPIC
Bilirubin Urine: NEGATIVE
Bilirubin Urine: NEGATIVE
Glucose, UA: NEGATIVE mg/dL
Ketones, ur: NEGATIVE mg/dL
Leukocytes, UA: NEGATIVE
Nitrite: NEGATIVE
Specific Gravity, Urine: 1.014 (ref 1.005–1.030)
pH: 5.5 (ref 5.0–8.0)
pH: 5 (ref 5.0–8.0)

## 2011-02-04 LAB — FECAL LACTOFERRIN, QUANT: Fecal Lactoferrin: NEGATIVE

## 2011-02-04 LAB — TYPE AND SCREEN: ABO/RH(D): O POS

## 2011-02-04 LAB — PREALBUMIN
Prealbumin: 14 mg/dL — ABNORMAL LOW (ref 18.0–45.0)
Prealbumin: 16.5 mg/dL — ABNORMAL LOW (ref 18.0–45.0)
Prealbumin: 9.7 mg/dL — ABNORMAL LOW (ref 18.0–45.0)

## 2011-02-04 LAB — CULTURE, BAL-QUANTITATIVE W GRAM STAIN
Colony Count: NO GROWTH
Gram Stain: NONE SEEN

## 2011-02-04 LAB — CULTURE, RESPIRATORY W GRAM STAIN

## 2011-02-04 LAB — HEPATITIS B CORE ANTIBODY, TOTAL: Hep B Core Total Ab: NEGATIVE

## 2011-02-04 LAB — HEPATITIS B SURFACE ANTIGEN: Hepatitis B Surface Ag: NEGATIVE

## 2011-02-04 LAB — AMMONIA: Ammonia: 27 umol/L (ref 11–35)

## 2011-02-04 LAB — STOOL CULTURE

## 2011-02-04 LAB — BRAIN NATRIURETIC PEPTIDE: Pro B Natriuretic peptide (BNP): 1100 pg/mL — ABNORMAL HIGH (ref 0.0–100.0)

## 2011-02-04 LAB — FERRITIN: Ferritin: 820 ng/mL — ABNORMAL HIGH (ref 10–291)

## 2011-02-04 LAB — MAGNESIUM
Magnesium: 2 mg/dL (ref 1.5–2.5)
Magnesium: 2.1 mg/dL (ref 1.5–2.5)
Magnesium: 2 mg/dL (ref 1.5–2.5)
Magnesium: 2.1 mg/dL (ref 1.5–2.5)

## 2011-02-04 LAB — FOLATE: Folate: 10.3 ng/mL

## 2011-02-04 LAB — APTT: aPTT: 39 seconds — ABNORMAL HIGH (ref 24–37)

## 2011-02-04 LAB — GIARDIA/CRYPTOSPORIDIUM SCREEN(EIA): Cryptosporidium Screen (EIA): NEGATIVE

## 2011-02-04 LAB — CHOLESTEROL, TOTAL: Cholesterol: 87 mg/dL (ref 0–200)

## 2011-02-05 LAB — GLUCOSE, CAPILLARY
Glucose-Capillary: 115 mg/dL — ABNORMAL HIGH (ref 70–99)
Glucose-Capillary: 125 mg/dL — ABNORMAL HIGH (ref 70–99)
Glucose-Capillary: 128 mg/dL — ABNORMAL HIGH (ref 70–99)
Glucose-Capillary: 129 mg/dL — ABNORMAL HIGH (ref 70–99)
Glucose-Capillary: 133 mg/dL — ABNORMAL HIGH (ref 70–99)
Glucose-Capillary: 135 mg/dL — ABNORMAL HIGH (ref 70–99)
Glucose-Capillary: 138 mg/dL — ABNORMAL HIGH (ref 70–99)
Glucose-Capillary: 141 mg/dL — ABNORMAL HIGH (ref 70–99)
Glucose-Capillary: 154 mg/dL — ABNORMAL HIGH (ref 70–99)
Glucose-Capillary: 154 mg/dL — ABNORMAL HIGH (ref 70–99)
Glucose-Capillary: 154 mg/dL — ABNORMAL HIGH (ref 70–99)
Glucose-Capillary: 171 mg/dL — ABNORMAL HIGH (ref 70–99)
Glucose-Capillary: 172 mg/dL — ABNORMAL HIGH (ref 70–99)
Glucose-Capillary: 182 mg/dL — ABNORMAL HIGH (ref 70–99)
Glucose-Capillary: 186 mg/dL — ABNORMAL HIGH (ref 70–99)
Glucose-Capillary: 190 mg/dL — ABNORMAL HIGH (ref 70–99)
Glucose-Capillary: 192 mg/dL — ABNORMAL HIGH (ref 70–99)
Glucose-Capillary: 196 mg/dL — ABNORMAL HIGH (ref 70–99)
Glucose-Capillary: 215 mg/dL — ABNORMAL HIGH (ref 70–99)
Glucose-Capillary: 230 mg/dL — ABNORMAL HIGH (ref 70–99)
Glucose-Capillary: 232 mg/dL — ABNORMAL HIGH (ref 70–99)
Glucose-Capillary: 241 mg/dL — ABNORMAL HIGH (ref 70–99)
Glucose-Capillary: 262 mg/dL — ABNORMAL HIGH (ref 70–99)
Glucose-Capillary: 335 mg/dL — ABNORMAL HIGH (ref 70–99)
Glucose-Capillary: 93 mg/dL (ref 70–99)
Glucose-Capillary: 101 mg/dL — ABNORMAL HIGH (ref 70–99)
Glucose-Capillary: 104 mg/dL — ABNORMAL HIGH (ref 70–99)
Glucose-Capillary: 106 mg/dL — ABNORMAL HIGH (ref 70–99)
Glucose-Capillary: 134 mg/dL — ABNORMAL HIGH (ref 70–99)
Glucose-Capillary: 148 mg/dL — ABNORMAL HIGH (ref 70–99)
Glucose-Capillary: 167 mg/dL — ABNORMAL HIGH (ref 70–99)
Glucose-Capillary: 168 mg/dL — ABNORMAL HIGH (ref 70–99)
Glucose-Capillary: 175 mg/dL — ABNORMAL HIGH (ref 70–99)
Glucose-Capillary: 176 mg/dL — ABNORMAL HIGH (ref 70–99)
Glucose-Capillary: 179 mg/dL — ABNORMAL HIGH (ref 70–99)
Glucose-Capillary: 186 mg/dL — ABNORMAL HIGH (ref 70–99)
Glucose-Capillary: 188 mg/dL — ABNORMAL HIGH (ref 70–99)
Glucose-Capillary: 190 mg/dL — ABNORMAL HIGH (ref 70–99)
Glucose-Capillary: 197 mg/dL — ABNORMAL HIGH (ref 70–99)
Glucose-Capillary: 198 mg/dL — ABNORMAL HIGH (ref 70–99)
Glucose-Capillary: 214 mg/dL — ABNORMAL HIGH (ref 70–99)
Glucose-Capillary: 219 mg/dL — ABNORMAL HIGH (ref 70–99)

## 2011-02-05 LAB — CBC
HCT: 22.6 % — ABNORMAL LOW (ref 36.0–46.0)
HCT: 24.8 % — ABNORMAL LOW (ref 36.0–46.0)
HCT: 25.7 % — ABNORMAL LOW (ref 36.0–46.0)
HCT: 26 % — ABNORMAL LOW (ref 36.0–46.0)
HCT: 26.9 % — ABNORMAL LOW (ref 36.0–46.0)
HCT: 27.2 % — ABNORMAL LOW (ref 36.0–46.0)
HCT: 28.5 % — ABNORMAL LOW (ref 36.0–46.0)
HCT: 21.5 % — ABNORMAL LOW (ref 36.0–46.0)
HCT: 27.1 % — ABNORMAL LOW (ref 36.0–46.0)
HCT: 27.3 % — ABNORMAL LOW (ref 36.0–46.0)
HCT: 27.4 % — ABNORMAL LOW (ref 36.0–46.0)
HCT: 32 % — ABNORMAL LOW (ref 36.0–46.0)
HCT: 33.7 % — ABNORMAL LOW (ref 36.0–46.0)
Hemoglobin: 10.9 g/dL — ABNORMAL LOW (ref 12.0–15.0)
Hemoglobin: 8.8 g/dL — ABNORMAL LOW (ref 12.0–15.0)
Hemoglobin: 8.8 g/dL — ABNORMAL LOW (ref 12.0–15.0)
Hemoglobin: 9 g/dL — ABNORMAL LOW (ref 12.0–15.0)
Hemoglobin: 9.4 g/dL — ABNORMAL LOW (ref 12.0–15.0)
Hemoglobin: 10.8 g/dL — ABNORMAL LOW (ref 12.0–15.0)
Hemoglobin: 11.4 g/dL — ABNORMAL LOW (ref 12.0–15.0)
Hemoglobin: 7.6 g/dL — CL (ref 12.0–15.0)
Hemoglobin: 9.2 g/dL — ABNORMAL LOW (ref 12.0–15.0)
Hemoglobin: 9.4 g/dL — ABNORMAL LOW (ref 12.0–15.0)
MCHC: 33.1 g/dL (ref 30.0–36.0)
MCHC: 33.6 g/dL (ref 30.0–36.0)
MCHC: 33.9 g/dL (ref 30.0–36.0)
MCHC: 34 g/dL (ref 30.0–36.0)
MCHC: 34.2 g/dL (ref 30.0–36.0)
MCHC: 33.7 g/dL (ref 30.0–36.0)
MCHC: 33.7 g/dL (ref 30.0–36.0)
MCHC: 34 g/dL (ref 30.0–36.0)
MCHC: 34.4 g/dL (ref 30.0–36.0)
MCV: 95.1 fL (ref 78.0–100.0)
MCV: 95.3 fL (ref 78.0–100.0)
MCV: 95.6 fL (ref 78.0–100.0)
MCV: 96.2 fL (ref 78.0–100.0)
MCV: 97.8 fL (ref 78.0–100.0)
MCV: 95.9 fL (ref 78.0–100.0)
MCV: 96.1 fL (ref 78.0–100.0)
MCV: 96.1 fL (ref 78.0–100.0)
MCV: 96.3 fL (ref 78.0–100.0)
MCV: 96.4 fL (ref 78.0–100.0)
MCV: 96.6 fL (ref 78.0–100.0)
MCV: 96.7 fL (ref 78.0–100.0)
Platelets: 248 10*3/uL (ref 150–400)
Platelets: 294 10*3/uL (ref 150–400)
Platelets: 325 10*3/uL (ref 150–400)
Platelets: 379 10*3/uL (ref 150–400)
Platelets: 517 10*3/uL — ABNORMAL HIGH (ref 150–400)
Platelets: 192 10*3/uL (ref 150–400)
Platelets: 200 10*3/uL (ref 150–400)
Platelets: 221 10*3/uL (ref 150–400)
Platelets: 230 10*3/uL (ref 150–400)
RBC: 2.38 MIL/uL — ABNORMAL LOW (ref 3.87–5.11)
RBC: 2.63 MIL/uL — ABNORMAL LOW (ref 3.87–5.11)
RBC: 2.69 MIL/uL — ABNORMAL LOW (ref 3.87–5.11)
RBC: 2.71 MIL/uL — ABNORMAL LOW (ref 3.87–5.11)
RBC: 2.72 MIL/uL — ABNORMAL LOW (ref 3.87–5.11)
RBC: 2.73 MIL/uL — ABNORMAL LOW (ref 3.87–5.11)
RBC: 2.81 MIL/uL — ABNORMAL LOW (ref 3.87–5.11)
RBC: 2.85 MIL/uL — ABNORMAL LOW (ref 3.87–5.11)
RBC: 2.92 MIL/uL — ABNORMAL LOW (ref 3.87–5.11)
RBC: 2.33 MIL/uL — ABNORMAL LOW (ref 3.87–5.11)
RBC: 2.82 MIL/uL — ABNORMAL LOW (ref 3.87–5.11)
RBC: 3.33 MIL/uL — ABNORMAL LOW (ref 3.87–5.11)
RBC: 3.51 MIL/uL — ABNORMAL LOW (ref 3.87–5.11)
RDW: 13.6 % (ref 11.5–15.5)
RDW: 14.3 % (ref 11.5–15.5)
RDW: 14.5 % (ref 11.5–15.5)
RDW: 13.7 % (ref 11.5–15.5)
WBC: 11.4 10*3/uL — ABNORMAL HIGH (ref 4.0–10.5)
WBC: 11.4 10*3/uL — ABNORMAL HIGH (ref 4.0–10.5)
WBC: 11.9 10*3/uL — ABNORMAL HIGH (ref 4.0–10.5)
WBC: 12.2 10*3/uL — ABNORMAL HIGH (ref 4.0–10.5)
WBC: 12.2 10*3/uL — ABNORMAL HIGH (ref 4.0–10.5)
WBC: 12.8 10*3/uL — ABNORMAL HIGH (ref 4.0–10.5)
WBC: 13.3 10*3/uL — ABNORMAL HIGH (ref 4.0–10.5)
WBC: 10.9 10*3/uL — ABNORMAL HIGH (ref 4.0–10.5)
WBC: 12.2 10*3/uL — ABNORMAL HIGH (ref 4.0–10.5)
WBC: 12.2 10*3/uL — ABNORMAL HIGH (ref 4.0–10.5)
WBC: 13.1 10*3/uL — ABNORMAL HIGH (ref 4.0–10.5)
WBC: 14.4 10*3/uL — ABNORMAL HIGH (ref 4.0–10.5)

## 2011-02-05 LAB — COMPREHENSIVE METABOLIC PANEL
ALT: 12 U/L (ref 0–35)
ALT: 13 U/L (ref 0–35)
AST: 32 U/L (ref 0–37)
AST: 33 U/L (ref 0–37)
Albumin: 2.1 g/dL — ABNORMAL LOW (ref 3.5–5.2)
Albumin: 3.7 g/dL (ref 3.5–5.2)
Alkaline Phosphatase: 105 U/L (ref 39–117)
Alkaline Phosphatase: 51 U/L (ref 39–117)
BUN: 24 mg/dL — ABNORMAL HIGH (ref 6–23)
BUN: 9 mg/dL (ref 6–23)
BUN: 23 mg/dL (ref 6–23)
CO2: 23 mEq/L (ref 19–32)
CO2: 30 mEq/L (ref 19–32)
Calcium: 7.7 mg/dL — ABNORMAL LOW (ref 8.4–10.5)
Calcium: 7.7 mg/dL — ABNORMAL LOW (ref 8.4–10.5)
Chloride: 104 mEq/L (ref 96–112)
Chloride: 107 mEq/L (ref 96–112)
Creatinine, Ser: 0.69 mg/dL (ref 0.4–1.2)
Creatinine, Ser: 1.52 mg/dL — ABNORMAL HIGH (ref 0.4–1.2)
GFR calc Af Amer: 41 mL/min — ABNORMAL LOW (ref >60)
GFR calc Af Amer: >60 mL/min (ref >60)
GFR calc non Af Amer: 34 mL/min — ABNORMAL LOW (ref >60)
GFR calc non Af Amer: >60 mL/min (ref >60)
Glucose, Bld: 208 mg/dL — ABNORMAL HIGH (ref 70–99)
Glucose, Bld: 218 mg/dL — ABNORMAL HIGH (ref 70–99)
Potassium: 5.2 mEq/L — ABNORMAL HIGH (ref 3.5–5.1)
Potassium: 5.2 mEq/L — ABNORMAL HIGH (ref 3.5–5.1)
Sodium: 129 mEq/L — ABNORMAL LOW (ref 135–145)
Sodium: 134 mEq/L — ABNORMAL LOW (ref 135–145)
Total Bilirubin: 0.4 mg/dL (ref 0.3–1.2)
Total Bilirubin: 0.4 mg/dL (ref 0.3–1.2)
Total Protein: 4.9 g/dL — ABNORMAL LOW (ref 6.0–8.3)
Total Protein: 6.7 g/dL (ref 6.0–8.3)

## 2011-02-05 LAB — BASIC METABOLIC PANEL
BUN: 4 mg/dL — ABNORMAL LOW (ref 6–23)
BUN: 6 mg/dL (ref 6–23)
BUN: 6 mg/dL (ref 6–23)
BUN: 7 mg/dL (ref 6–23)
BUN: 7 mg/dL (ref 6–23)
BUN: 11 mg/dL (ref 6–23)
BUN: 4 mg/dL — ABNORMAL LOW (ref 6–23)
BUN: 5 mg/dL — ABNORMAL LOW (ref 6–23)
CO2: 24 mEq/L (ref 19–32)
CO2: 25 mEq/L (ref 19–32)
CO2: 27 mEq/L (ref 19–32)
CO2: 29 mEq/L (ref 19–32)
CO2: 23 mEq/L (ref 19–32)
CO2: 23 mEq/L (ref 19–32)
CO2: 25 mEq/L (ref 19–32)
CO2: 25 mEq/L (ref 19–32)
CO2: 25 mEq/L (ref 19–32)
Calcium: 7.6 mg/dL — ABNORMAL LOW (ref 8.4–10.5)
Calcium: 7.9 mg/dL — ABNORMAL LOW (ref 8.4–10.5)
Calcium: 8.2 mg/dL — ABNORMAL LOW (ref 8.4–10.5)
Calcium: 8.1 mg/dL — ABNORMAL LOW (ref 8.4–10.5)
Chloride: 101 mEq/L (ref 96–112)
Chloride: 102 mEq/L (ref 96–112)
Chloride: 102 mEq/L (ref 96–112)
Chloride: 98 mEq/L (ref 96–112)
Chloride: 99 mEq/L (ref 96–112)
Chloride: 102 mEq/L (ref 96–112)
Chloride: 103 mEq/L (ref 96–112)
Chloride: 106 mEq/L (ref 96–112)
Chloride: 107 mEq/L (ref 96–112)
Chloride: 108 mEq/L (ref 96–112)
Chloride: 109 mEq/L (ref 96–112)
Creatinine, Ser: 0.73 mg/dL (ref 0.4–1.2)
Creatinine, Ser: 0.79 mg/dL (ref 0.4–1.2)
Creatinine, Ser: 0.8 mg/dL (ref 0.4–1.2)
Creatinine, Ser: 0.85 mg/dL (ref 0.4–1.2)
Creatinine, Ser: 0.68 mg/dL (ref 0.4–1.2)
Creatinine, Ser: 0.71 mg/dL (ref 0.4–1.2)
Creatinine, Ser: 0.82 mg/dL (ref 0.4–1.2)
Creatinine, Ser: 0.96 mg/dL (ref 0.4–1.2)
GFR calc Af Amer: >60 mL/min (ref >60)
GFR calc Af Amer: >60 mL/min (ref >60)
GFR calc Af Amer: >60 mL/min (ref >60)
GFR calc Af Amer: >60 mL/min (ref >60)
GFR calc Af Amer: >60 mL/min (ref >60)
GFR calc Af Amer: >60 mL/min (ref >60)
GFR calc Af Amer: >60 mL/min (ref >60)
GFR calc Af Amer: >60 mL/min (ref >60)
GFR calc non Af Amer: >60 mL/min (ref >60)
GFR calc non Af Amer: >60 mL/min (ref >60)
GFR calc non Af Amer: >60 mL/min (ref >60)
GFR calc non Af Amer: >60 mL/min (ref >60)
GFR calc non Af Amer: >60 mL/min (ref >60)
GFR calc non Af Amer: >60 mL/min (ref >60)
Glucose, Bld: 167 mg/dL — ABNORMAL HIGH (ref 70–99)
Glucose, Bld: 216 mg/dL — ABNORMAL HIGH (ref 70–99)
Glucose, Bld: 87 mg/dL (ref 70–99)
Glucose, Bld: 192 mg/dL — ABNORMAL HIGH (ref 70–99)
Glucose, Bld: 199 mg/dL — ABNORMAL HIGH (ref 70–99)
Potassium: 3.7 mEq/L (ref 3.5–5.1)
Potassium: 3.8 mEq/L (ref 3.5–5.1)
Potassium: 4 mEq/L (ref 3.5–5.1)
Potassium: 4.1 mEq/L (ref 3.5–5.1)
Potassium: 4.1 mEq/L (ref 3.5–5.1)
Potassium: 4.2 mEq/L (ref 3.5–5.1)
Potassium: 3.5 mEq/L (ref 3.5–5.1)
Potassium: 3.5 mEq/L (ref 3.5–5.1)
Potassium: 4.2 mEq/L (ref 3.5–5.1)
Potassium: 4.5 mEq/L (ref 3.5–5.1)
Potassium: 4.9 mEq/L (ref 3.5–5.1)
Sodium: 131 mEq/L — ABNORMAL LOW (ref 135–145)
Sodium: 131 mEq/L — ABNORMAL LOW (ref 135–145)
Sodium: 131 mEq/L — ABNORMAL LOW (ref 135–145)
Sodium: 135 mEq/L (ref 135–145)
Sodium: 132 mEq/L — ABNORMAL LOW (ref 135–145)
Sodium: 141 mEq/L (ref 135–145)

## 2011-02-05 LAB — DIFFERENTIAL
Basophils Absolute: 0.1 10*3/uL (ref 0.0–0.1)
Basophils Relative: 0 % (ref 0–1)
Basophils Relative: 0 % (ref 0–1)
Eosinophils Absolute: 0.2 10*3/uL (ref 0.0–0.7)
Eosinophils Relative: 2 % (ref 0–5)
Eosinophils Relative: 2 % (ref 0–5)
Eosinophils Relative: 1 % (ref 0–5)
Lymphocytes Relative: 7 % — ABNORMAL LOW (ref 12–46)
Lymphocytes Relative: 9 % — ABNORMAL LOW (ref 12–46)
Lymphs Abs: 0.8 10*3/uL (ref 0.7–4.0)
Lymphs Abs: 0.9 10*3/uL (ref 0.7–4.0)
Lymphs Abs: 1.1 10*3/uL (ref 0.7–4.0)
Monocytes Absolute: 0.9 10*3/uL (ref 0.1–1.0)
Monocytes Absolute: 1 10*3/uL (ref 0.1–1.0)
Monocytes Absolute: 1 10*3/uL (ref 0.1–1.0)
Monocytes Relative: 7 % (ref 3–12)
Monocytes Relative: 8 % (ref 3–12)
Neutro Abs: 12.4 10*3/uL — ABNORMAL HIGH (ref 1.7–7.7)
Neutro Abs: 9.8 10*3/uL — ABNORMAL HIGH (ref 1.7–7.7)
Neutro Abs: 9.6 10*3/uL — ABNORMAL HIGH (ref 1.7–7.7)
Neutrophils Relative %: 80 % — ABNORMAL HIGH (ref 43–77)

## 2011-02-05 LAB — CROSSMATCH
ABO/RH(D): O POS
Antibody Screen: NEGATIVE

## 2011-02-05 LAB — TYPE AND SCREEN

## 2011-02-05 LAB — URINALYSIS, MICROSCOPIC ONLY
Bilirubin Urine: NEGATIVE
Glucose, UA: NEGATIVE mg/dL
Ketones, ur: NEGATIVE mg/dL
Protein, ur: NEGATIVE mg/dL

## 2011-02-05 LAB — CULTURE, BLOOD (ROUTINE X 2)
Culture: NO GROWTH
Culture: NO GROWTH

## 2011-02-05 LAB — APTT: aPTT: 23 seconds — ABNORMAL LOW (ref 24–37)

## 2011-02-05 LAB — CARDIAC PANEL(CRET KIN+CKTOT+MB+TROPI)
CK, MB: 1.1 ng/mL (ref 0.3–4.0)
CK, MB: 1.1 ng/mL (ref 0.3–4.0)
Troponin I: 0.02 ng/mL (ref 0.00–0.06)
Troponin I: 0.03 ng/mL (ref 0.00–0.06)

## 2011-02-05 LAB — PHOSPHORUS
Phosphorus: 3.7 mg/dL (ref 2.3–4.6)
Phosphorus: 4 mg/dL (ref 2.3–4.6)

## 2011-02-05 LAB — HEMOGLOBIN A1C
Hgb A1c MFr Bld: 6.9 % — ABNORMAL HIGH (ref 4.6–6.1)
Mean Plasma Glucose: 151 mg/dL

## 2011-02-05 LAB — URINE CULTURE: Colony Count: NO GROWTH

## 2011-02-05 LAB — VANCOMYCIN, TROUGH: Vancomycin Tr: 35.4 ug/mL (ref 10.0–20.0)

## 2011-02-05 LAB — POCT CARDIAC MARKERS
Myoglobin, poc: 120 ng/mL (ref 12–200)
Troponin i, poc: <0.05 ng/mL (ref 0.00–0.09)

## 2011-02-05 LAB — TRIGLYCERIDES: Triglycerides: 45 mg/dL (ref <150)

## 2011-02-05 LAB — PREALBUMIN
Prealbumin: 7.1 mg/dL — ABNORMAL LOW (ref 18.0–45.0)
Prealbumin: 7.1 mg/dL — ABNORMAL LOW (ref 18.0–45.0)

## 2011-02-05 LAB — MAGNESIUM: Magnesium: 1.7 mg/dL (ref 1.5–2.5)

## 2011-02-05 LAB — URINALYSIS, ROUTINE W REFLEX MICROSCOPIC
Bilirubin Urine: NEGATIVE
Hgb urine dipstick: NEGATIVE
Ketones, ur: NEGATIVE mg/dL
Nitrite: NEGATIVE
Urobilinogen, UA: 0.2 mg/dL (ref 0.0–1.0)

## 2011-02-05 LAB — PROTIME-INR
INR: 1.1 (ref 0.00–1.49)
Prothrombin Time: 13.8 seconds (ref 11.6–15.2)

## 2011-02-05 LAB — CORTISOL: Cortisol, Plasma: 18.6 ug/dL

## 2011-02-05 LAB — CHOLESTEROL, TOTAL: Cholesterol: 80 mg/dL (ref 0–200)

## 2011-02-07 LAB — DIFFERENTIAL
Basophils Absolute: 0 10*3/uL (ref 0.0–0.1)
Lymphocytes Relative: 24 % (ref 12–46)
Monocytes Absolute: 0.5 10*3/uL (ref 0.1–1.0)
Neutro Abs: 3.1 10*3/uL (ref 1.7–7.7)
Neutrophils Relative %: 63 % (ref 43–77)

## 2011-02-07 LAB — CBC
Hemoglobin: 10.7 g/dL — ABNORMAL LOW (ref 12.0–15.0)
RBC: 3.29 MIL/uL — ABNORMAL LOW (ref 3.87–5.11)
RDW: 14.8 % (ref 11.5–15.5)

## 2011-02-07 LAB — GLUCOSE, CAPILLARY: Glucose-Capillary: 109 mg/dL — ABNORMAL HIGH (ref 70–99)

## 2011-02-07 LAB — BASIC METABOLIC PANEL
Calcium: 9.2 mg/dL (ref 8.4–10.5)
Creatinine, Ser: 0.87 mg/dL (ref 0.4–1.2)
GFR calc Af Amer: >60 mL/min (ref >60)
GFR calc non Af Amer: >60 mL/min (ref >60)
Sodium: 138 mEq/L (ref 135–145)

## 2011-02-08 ENCOUNTER — Ambulatory Visit: Payer: Medicare Other | Admitting: Physical Therapy

## 2011-02-08 LAB — CBC
HCT: 34.1 % — ABNORMAL LOW (ref 36.0–46.0)
Hemoglobin: 11.3 g/dL — ABNORMAL LOW (ref 12.0–15.0)
MCHC: 33.1 g/dL (ref 30.0–36.0)
MCV: 94.2 fL (ref 78.0–100.0)
Platelets: 222 10*3/uL (ref 150–400)
RBC: 3.62 MIL/uL — ABNORMAL LOW (ref 3.87–5.11)
RDW: 16.4 % — ABNORMAL HIGH (ref 11.5–15.5)
WBC: 8.3 10*3/uL (ref 4.0–10.5)

## 2011-02-08 LAB — BASIC METABOLIC PANEL
CO2: 27 mEq/L (ref 19–32)
Calcium: 9.3 mg/dL (ref 8.4–10.5)
Creatinine, Ser: 0.75 mg/dL (ref 0.4–1.2)
GFR calc Af Amer: >60 mL/min (ref >60)
GFR calc non Af Amer: >60 mL/min (ref >60)
Sodium: 141 mEq/L (ref 135–145)

## 2011-02-08 LAB — GLUCOSE, CAPILLARY
Glucose-Capillary: 161 mg/dL — ABNORMAL HIGH (ref 70–99)
Glucose-Capillary: 182 mg/dL — ABNORMAL HIGH (ref 70–99)
Glucose-Capillary: 238 mg/dL — ABNORMAL HIGH (ref 70–99)
Glucose-Capillary: 80 mg/dL (ref 70–99)

## 2011-02-12 ENCOUNTER — Ambulatory Visit: Payer: Medicare Other | Admitting: Physical Therapy

## 2011-02-15 ENCOUNTER — Ambulatory Visit: Payer: Medicare Other | Admitting: Physical Therapy

## 2011-02-15 LAB — DIFFERENTIAL
Basophils Absolute: 0.1 10*3/uL (ref 0.0–0.1)
Basophils Absolute: 0.1 10*3/uL (ref 0.0–0.1)
Basophils Absolute: 0 10*3/uL (ref 0.0–0.1)
Basophils Relative: 1 % (ref 0–1)
Basophils Relative: 1 % (ref 0–1)
Basophils Relative: 0 % (ref 0–1)
Eosinophils Relative: 4 % (ref 0–5)
Eosinophils Relative: 1 % (ref 0–5)
Lymphocytes Relative: 15 % (ref 12–46)
Monocytes Absolute: 1.2 10*3/uL — ABNORMAL HIGH (ref 0.1–1.0)
Monocytes Relative: 11 % (ref 3–12)
Monocytes Relative: 14 % — ABNORMAL HIGH (ref 3–12)
Neutro Abs: 5.3 10*3/uL (ref 1.7–7.7)
Neutro Abs: 7.4 10*3/uL (ref 1.7–7.7)
Neutrophils Relative %: 62 % (ref 43–77)
Neutrophils Relative %: 69 % (ref 43–77)

## 2011-02-15 LAB — COMPREHENSIVE METABOLIC PANEL
ALT: 10 U/L (ref 0–35)
ALT: 12 U/L (ref 0–35)
ALT: 12 U/L (ref 0–35)
ALT: 12 U/L (ref 0–35)
ALT: 14 U/L (ref 0–35)
ALT: 9 U/L (ref 0–35)
AST: 16 U/L (ref 0–37)
AST: 16 U/L (ref 0–37)
AST: 17 U/L (ref 0–37)
AST: 17 U/L (ref 0–37)
AST: 18 U/L (ref 0–37)
AST: 18 U/L (ref 0–37)
AST: 18 U/L (ref 0–37)
Albumin: 1.9 g/dL — ABNORMAL LOW (ref 3.5–5.2)
Albumin: 1.9 g/dL — ABNORMAL LOW (ref 3.5–5.2)
Albumin: 1.9 g/dL — ABNORMAL LOW (ref 3.5–5.2)
Albumin: 2.2 g/dL — ABNORMAL LOW (ref 3.5–5.2)
Albumin: 2.2 g/dL — ABNORMAL LOW (ref 3.5–5.2)
Alkaline Phosphatase: 48 U/L (ref 39–117)
Alkaline Phosphatase: 50 U/L (ref 39–117)
Alkaline Phosphatase: 50 U/L (ref 39–117)
Alkaline Phosphatase: 51 U/L (ref 39–117)
Alkaline Phosphatase: 53 U/L (ref 39–117)
Alkaline Phosphatase: 54 U/L (ref 39–117)
Alkaline Phosphatase: 54 U/L (ref 39–117)
Alkaline Phosphatase: 56 U/L (ref 39–117)
BUN: 10 mg/dL (ref 6–23)
BUN: 13 mg/dL (ref 6–23)
BUN: 13 mg/dL (ref 6–23)
BUN: 14 mg/dL (ref 6–23)
BUN: 15 mg/dL (ref 6–23)
BUN: 15 mg/dL (ref 6–23)
BUN: 6 mg/dL (ref 6–23)
BUN: 8 mg/dL (ref 6–23)
BUN: 8 mg/dL (ref 6–23)
CO2: 25 mEq/L (ref 19–32)
CO2: 26 mEq/L (ref 19–32)
CO2: 26 mEq/L (ref 19–32)
CO2: 27 mEq/L (ref 19–32)
CO2: 27 mEq/L (ref 19–32)
CO2: 27 mEq/L (ref 19–32)
CO2: 28 mEq/L (ref 19–32)
CO2: 21 mEq/L (ref 19–32)
Calcium: 8.2 mg/dL — ABNORMAL LOW (ref 8.4–10.5)
Calcium: 8.2 mg/dL — ABNORMAL LOW (ref 8.4–10.5)
Calcium: 8.6 mg/dL (ref 8.4–10.5)
Calcium: 8.7 mg/dL (ref 8.4–10.5)
Chloride: 100 mEq/L (ref 96–112)
Chloride: 100 mEq/L (ref 96–112)
Chloride: 101 mEq/L (ref 96–112)
Chloride: 102 mEq/L (ref 96–112)
Chloride: 104 mEq/L (ref 96–112)
Chloride: 101 mEq/L (ref 96–112)
Creatinine, Ser: 0.68 mg/dL (ref 0.4–1.2)
Creatinine, Ser: 0.69 mg/dL (ref 0.4–1.2)
Creatinine, Ser: 0.73 mg/dL (ref 0.4–1.2)
Creatinine, Ser: 0.73 mg/dL (ref 0.4–1.2)
Creatinine, Ser: 0.74 mg/dL (ref 0.4–1.2)
Creatinine, Ser: 0.78 mg/dL (ref 0.4–1.2)
Creatinine, Ser: 0.86 mg/dL (ref 0.4–1.2)
GFR calc Af Amer: >60 mL/min (ref >60)
GFR calc Af Amer: >60 mL/min (ref >60)
GFR calc Af Amer: >60 mL/min (ref >60)
GFR calc Af Amer: >60 mL/min (ref >60)
GFR calc Af Amer: >60 mL/min (ref >60)
GFR calc Af Amer: >60 mL/min (ref >60)
GFR calc Af Amer: >60 mL/min (ref >60)
GFR calc non Af Amer: >60 mL/min (ref >60)
GFR calc non Af Amer: >60 mL/min (ref >60)
GFR calc non Af Amer: >60 mL/min (ref >60)
GFR calc non Af Amer: >60 mL/min (ref >60)
GFR calc non Af Amer: >60 mL/min (ref >60)
GFR calc non Af Amer: >60 mL/min (ref >60)
Glucose, Bld: 147 mg/dL — ABNORMAL HIGH (ref 70–99)
Glucose, Bld: 159 mg/dL — ABNORMAL HIGH (ref 70–99)
Glucose, Bld: 164 mg/dL — ABNORMAL HIGH (ref 70–99)
Glucose, Bld: 167 mg/dL — ABNORMAL HIGH (ref 70–99)
Glucose, Bld: 236 mg/dL — ABNORMAL HIGH (ref 70–99)
Glucose, Bld: 262 mg/dL — ABNORMAL HIGH (ref 70–99)
Glucose, Bld: 94 mg/dL (ref 70–99)
Potassium: 3.7 mEq/L (ref 3.5–5.1)
Potassium: 3.7 mEq/L (ref 3.5–5.1)
Potassium: 3.7 mEq/L (ref 3.5–5.1)
Potassium: 3.9 mEq/L (ref 3.5–5.1)
Potassium: 3.9 mEq/L (ref 3.5–5.1)
Potassium: 4 mEq/L (ref 3.5–5.1)
Potassium: 4 mEq/L (ref 3.5–5.1)
Sodium: 132 mEq/L — ABNORMAL LOW (ref 135–145)
Sodium: 134 mEq/L — ABNORMAL LOW (ref 135–145)
Sodium: 135 mEq/L (ref 135–145)
Sodium: 136 mEq/L (ref 135–145)
Sodium: 137 mEq/L (ref 135–145)
Sodium: 137 mEq/L (ref 135–145)
Total Bilirubin: 0.3 mg/dL (ref 0.3–1.2)
Total Bilirubin: 0.4 mg/dL (ref 0.3–1.2)
Total Bilirubin: 0.4 mg/dL (ref 0.3–1.2)
Total Bilirubin: 0.4 mg/dL (ref 0.3–1.2)
Total Bilirubin: 0.5 mg/dL (ref 0.3–1.2)
Total Bilirubin: 0.6 mg/dL (ref 0.3–1.2)
Total Protein: 4.9 g/dL — ABNORMAL LOW (ref 6.0–8.3)
Total Protein: 4.9 g/dL — ABNORMAL LOW (ref 6.0–8.3)
Total Protein: 5.2 g/dL — ABNORMAL LOW (ref 6.0–8.3)
Total Protein: 5.3 g/dL — ABNORMAL LOW (ref 6.0–8.3)
Total Protein: 5.4 g/dL — ABNORMAL LOW (ref 6.0–8.3)
Total Protein: 5.5 g/dL — ABNORMAL LOW (ref 6.0–8.3)

## 2011-02-15 LAB — URINALYSIS, ROUTINE W REFLEX MICROSCOPIC
Glucose, UA: NEGATIVE mg/dL
Hgb urine dipstick: NEGATIVE
Ketones, ur: NEGATIVE mg/dL
Nitrite: NEGATIVE
Specific Gravity, Urine: 1.01 (ref 1.005–1.030)
Specific Gravity, Urine: 1.006 (ref 1.005–1.030)
pH: 6.5 (ref 5.0–8.0)
pH: 7.5 (ref 5.0–8.0)

## 2011-02-15 LAB — CULTURE, BLOOD (ROUTINE X 2)

## 2011-02-15 LAB — GLUCOSE, CAPILLARY
Glucose-Capillary: 101 mg/dL — ABNORMAL HIGH (ref 70–99)
Glucose-Capillary: 105 mg/dL — ABNORMAL HIGH (ref 70–99)
Glucose-Capillary: 115 mg/dL — ABNORMAL HIGH (ref 70–99)
Glucose-Capillary: 127 mg/dL — ABNORMAL HIGH (ref 70–99)
Glucose-Capillary: 129 mg/dL — ABNORMAL HIGH (ref 70–99)
Glucose-Capillary: 130 mg/dL — ABNORMAL HIGH (ref 70–99)
Glucose-Capillary: 134 mg/dL — ABNORMAL HIGH (ref 70–99)
Glucose-Capillary: 136 mg/dL — ABNORMAL HIGH (ref 70–99)
Glucose-Capillary: 145 mg/dL — ABNORMAL HIGH (ref 70–99)
Glucose-Capillary: 152 mg/dL — ABNORMAL HIGH (ref 70–99)
Glucose-Capillary: 152 mg/dL — ABNORMAL HIGH (ref 70–99)
Glucose-Capillary: 155 mg/dL — ABNORMAL HIGH (ref 70–99)
Glucose-Capillary: 162 mg/dL — ABNORMAL HIGH (ref 70–99)
Glucose-Capillary: 167 mg/dL — ABNORMAL HIGH (ref 70–99)
Glucose-Capillary: 167 mg/dL — ABNORMAL HIGH (ref 70–99)
Glucose-Capillary: 172 mg/dL — ABNORMAL HIGH (ref 70–99)
Glucose-Capillary: 183 mg/dL — ABNORMAL HIGH (ref 70–99)
Glucose-Capillary: 187 mg/dL — ABNORMAL HIGH (ref 70–99)
Glucose-Capillary: 188 mg/dL — ABNORMAL HIGH (ref 70–99)
Glucose-Capillary: 188 mg/dL — ABNORMAL HIGH (ref 70–99)
Glucose-Capillary: 194 mg/dL — ABNORMAL HIGH (ref 70–99)
Glucose-Capillary: 195 mg/dL — ABNORMAL HIGH (ref 70–99)
Glucose-Capillary: 207 mg/dL — ABNORMAL HIGH (ref 70–99)
Glucose-Capillary: 228 mg/dL — ABNORMAL HIGH (ref 70–99)
Glucose-Capillary: 229 mg/dL — ABNORMAL HIGH (ref 70–99)
Glucose-Capillary: 240 mg/dL — ABNORMAL HIGH (ref 70–99)
Glucose-Capillary: 78 mg/dL (ref 70–99)
Glucose-Capillary: 79 mg/dL (ref 70–99)

## 2011-02-15 LAB — CBC
HCT: 25.1 % — ABNORMAL LOW (ref 36.0–46.0)
HCT: 25.3 % — ABNORMAL LOW (ref 36.0–46.0)
HCT: 25.6 % — ABNORMAL LOW (ref 36.0–46.0)
HCT: 26.3 % — ABNORMAL LOW (ref 36.0–46.0)
HCT: 32.6 % — ABNORMAL LOW (ref 36.0–46.0)
Hemoglobin: 8.5 g/dL — ABNORMAL LOW (ref 12.0–15.0)
Hemoglobin: 8.6 g/dL — ABNORMAL LOW (ref 12.0–15.0)
Hemoglobin: 8.6 g/dL — ABNORMAL LOW (ref 12.0–15.0)
Hemoglobin: 8.7 g/dL — ABNORMAL LOW (ref 12.0–15.0)
Hemoglobin: 9.1 g/dL — ABNORMAL LOW (ref 12.0–15.0)
MCHC: 33.9 g/dL (ref 30.0–36.0)
MCHC: 34.5 g/dL (ref 30.0–36.0)
MCV: 93 fL (ref 78.0–100.0)
MCV: 93 fL (ref 78.0–100.0)
MCV: 93.5 fL (ref 78.0–100.0)
MCV: 93.3 fL (ref 78.0–100.0)
Platelets: 343 10*3/uL (ref 150–400)
Platelets: 354 10*3/uL (ref 150–400)
RBC: 2.7 MIL/uL — ABNORMAL LOW (ref 3.87–5.11)
RBC: 2.7 MIL/uL — ABNORMAL LOW (ref 3.87–5.11)
RBC: 3.49 MIL/uL — ABNORMAL LOW (ref 3.87–5.11)
RDW: 14.9 % (ref 11.5–15.5)
RDW: 15.2 % (ref 11.5–15.5)
RDW: 15.8 % — ABNORMAL HIGH (ref 11.5–15.5)
RDW: 16.3 % — ABNORMAL HIGH (ref 11.5–15.5)
WBC: 7.7 10*3/uL (ref 4.0–10.5)
WBC: 8.6 10*3/uL (ref 4.0–10.5)
WBC: 10.7 10*3/uL — ABNORMAL HIGH (ref 4.0–10.5)

## 2011-02-15 LAB — POCT CARDIAC MARKERS: Troponin i, poc: <0.05 ng/mL (ref 0.00–0.09)

## 2011-02-15 LAB — URINE MICROSCOPIC-ADD ON

## 2011-02-15 LAB — URINE CULTURE

## 2011-02-15 LAB — POCT I-STAT, CHEM 8
BUN: 10 mg/dL (ref 6–23)
HCT: 33 % — ABNORMAL LOW (ref 36.0–46.0)
Sodium: 133 mEq/L — ABNORMAL LOW (ref 135–145)
TCO2: 24 mmol/L (ref 0–100)

## 2011-02-15 LAB — PREALBUMIN: Prealbumin: 8.2 mg/dL — ABNORMAL LOW (ref 18.0–45.0)

## 2011-02-15 LAB — PHOSPHORUS
Phosphorus: 3.1 mg/dL (ref 2.3–4.6)
Phosphorus: 3.5 mg/dL (ref 2.3–4.6)
Phosphorus: 3.8 mg/dL (ref 2.3–4.6)

## 2011-02-15 LAB — TRIGLYCERIDES: Triglycerides: 71 mg/dL (ref <150)

## 2011-02-16 LAB — COMPREHENSIVE METABOLIC PANEL
ALT: 10 U/L (ref 0–35)
ALT: 11 U/L (ref 0–35)
AST: 14 U/L (ref 0–37)
AST: 18 U/L (ref 0–37)
AST: 19 U/L (ref 0–37)
Albumin: 2.1 g/dL — ABNORMAL LOW (ref 3.5–5.2)
Albumin: 2.1 g/dL — ABNORMAL LOW (ref 3.5–5.2)
Albumin: 2.5 g/dL — ABNORMAL LOW (ref 3.5–5.2)
Alkaline Phosphatase: 49 U/L (ref 39–117)
Alkaline Phosphatase: 57 U/L (ref 39–117)
BUN: 7 mg/dL (ref 6–23)
BUN: 9 mg/dL (ref 6–23)
CO2: 26 mEq/L (ref 19–32)
CO2: 26 mEq/L (ref 19–32)
Calcium: 8.4 mg/dL (ref 8.4–10.5)
Calcium: 8.7 mg/dL (ref 8.4–10.5)
Calcium: 8.9 mg/dL (ref 8.4–10.5)
Chloride: 99 mEq/L (ref 96–112)
Creatinine, Ser: 0.7 mg/dL (ref 0.4–1.2)
Creatinine, Ser: 0.84 mg/dL (ref 0.4–1.2)
GFR calc Af Amer: >60 mL/min (ref >60)
GFR calc Af Amer: >60 mL/min (ref >60)
GFR calc Af Amer: >60 mL/min (ref >60)
GFR calc non Af Amer: >60 mL/min (ref >60)
Glucose, Bld: 129 mg/dL — ABNORMAL HIGH (ref 70–99)
Potassium: 3.5 mEq/L (ref 3.5–5.1)
Potassium: 4.3 mEq/L (ref 3.5–5.1)
Sodium: 129 mEq/L — ABNORMAL LOW (ref 135–145)
Sodium: 130 mEq/L — ABNORMAL LOW (ref 135–145)
Sodium: 132 mEq/L — ABNORMAL LOW (ref 135–145)
Total Bilirubin: 0.4 mg/dL (ref 0.3–1.2)
Total Bilirubin: 0.4 mg/dL (ref 0.3–1.2)
Total Protein: 5.3 g/dL — ABNORMAL LOW (ref 6.0–8.3)
Total Protein: 5.4 g/dL — ABNORMAL LOW (ref 6.0–8.3)
Total Protein: 5.5 g/dL — ABNORMAL LOW (ref 6.0–8.3)

## 2011-02-16 LAB — CBC
HCT: 26.3 % — ABNORMAL LOW (ref 36.0–46.0)
HCT: 27.3 % — ABNORMAL LOW (ref 36.0–46.0)
Hemoglobin: 9.4 g/dL — ABNORMAL LOW (ref 12.0–15.0)
MCHC: 33.8 g/dL (ref 30.0–36.0)
MCHC: 34.3 g/dL (ref 30.0–36.0)
MCHC: 34.3 g/dL (ref 30.0–36.0)
MCV: 91.2 fL (ref 78.0–100.0)
MCV: 92.3 fL (ref 78.0–100.0)
Platelets: 396 10*3/uL (ref 150–400)
Platelets: 434 10*3/uL — ABNORMAL HIGH (ref 150–400)
RBC: 2.92 MIL/uL — ABNORMAL LOW (ref 3.87–5.11)
RBC: 3.38 MIL/uL — ABNORMAL LOW (ref 3.87–5.11)
RDW: 14.8 % (ref 11.5–15.5)
RDW: 14.9 % (ref 11.5–15.5)
RDW: 15.1 % (ref 11.5–15.5)
WBC: 11.8 10*3/uL — ABNORMAL HIGH (ref 4.0–10.5)

## 2011-02-16 LAB — GLUCOSE, CAPILLARY
Glucose-Capillary: 109 mg/dL — ABNORMAL HIGH (ref 70–99)
Glucose-Capillary: 143 mg/dL — ABNORMAL HIGH (ref 70–99)
Glucose-Capillary: 145 mg/dL — ABNORMAL HIGH (ref 70–99)
Glucose-Capillary: 146 mg/dL — ABNORMAL HIGH (ref 70–99)
Glucose-Capillary: 151 mg/dL — ABNORMAL HIGH (ref 70–99)
Glucose-Capillary: 156 mg/dL — ABNORMAL HIGH (ref 70–99)
Glucose-Capillary: 173 mg/dL — ABNORMAL HIGH (ref 70–99)
Glucose-Capillary: 265 mg/dL — ABNORMAL HIGH (ref 70–99)

## 2011-02-16 LAB — BODY FLUID CULTURE

## 2011-02-16 LAB — ANAEROBIC CULTURE

## 2011-02-18 ENCOUNTER — Ambulatory Visit: Payer: Medicare Other | Admitting: *Deleted

## 2011-02-25 ENCOUNTER — Encounter: Payer: Self-pay | Admitting: Family Medicine

## 2011-03-01 ENCOUNTER — Ambulatory Visit: Payer: Medicare Other | Admitting: Physical Therapy

## 2011-03-05 ENCOUNTER — Ambulatory Visit: Payer: Medicare Other | Attending: Family Medicine | Admitting: Physical Therapy

## 2011-03-05 DIAGNOSIS — R5381 Other malaise: Secondary | ICD-10-CM | POA: Insufficient documentation

## 2011-03-05 DIAGNOSIS — M545 Low back pain, unspecified: Secondary | ICD-10-CM | POA: Insufficient documentation

## 2011-03-05 DIAGNOSIS — IMO0001 Reserved for inherently not codable concepts without codable children: Secondary | ICD-10-CM | POA: Insufficient documentation

## 2011-03-05 DIAGNOSIS — R293 Abnormal posture: Secondary | ICD-10-CM | POA: Insufficient documentation

## 2011-03-05 DIAGNOSIS — M6281 Muscle weakness (generalized): Secondary | ICD-10-CM | POA: Insufficient documentation

## 2011-03-09 ENCOUNTER — Ambulatory Visit: Payer: Medicare Other | Admitting: Physical Therapy

## 2011-03-12 ENCOUNTER — Ambulatory Visit: Payer: Medicare Other | Admitting: Physical Therapy

## 2011-03-15 ENCOUNTER — Ambulatory Visit: Payer: Medicare Other | Admitting: Physical Therapy

## 2011-03-16 ENCOUNTER — Ambulatory Visit: Payer: Medicare Other | Admitting: Physical Therapy

## 2011-03-16 NOTE — H&P (Signed)
[name], [name]              ACCOUNT NO.:  [id number]   MEDICAL RECORD NO.:  [id number]          PATIENT TYPE:  INP   LOCATION:  1527                         FACILITY:  [facility]   PHYSICIAN:  Juanetta Gosling, MDDATE OF BIRTH:  Oct 03, 1936   DATE OF ADMISSION:  10/28/2008  DATE OF DISCHARGE:                              HISTORY & PHYSICAL   [name] is a 75 year old female with history of multiple episodes  of diverticulitis who underwent a very difficult sigmoid colectomy about  2 weeks ago.  I ended up performing a Hartmann's procedure for what  ended up being very friable bowel.  She also underwent a left  oophorectomy due to the amount of inflammation she had.  She was  admitted about 14 days where she had some superficial necrosis of her  colostomy.  I was concerned with some low grade fevers several days  before discharge, performed a CT scan and evaluated her with some  cultures which all ended up being normal as can be expected.  She was  discharged home doing pretty well, still not with her normal strength  but she returns today with malaise, nausea and just not feeling well.   PAST MEDICAL HISTORY:  1. Recurrent diverticulitis.  2. Type 2 diabetes.  3. GERD.  4. Osteoporosis.  5. Hypertension.   PAST SURGICAL HISTORY:  As above, right ovarian cystectomy, bilateral  wrist surgery, bilateral shoulder surgery and a right total knee  replacement.   MEDICATIONS:  1. Amaryl 20 daily.  2. Actos 30 daily.  3. Antara 130 daily.  4. Boniva 150 mg monthly.  5. Os-Cal.  6. Fish oil.  7. Nexium.  8. Vitamin D.  9. Azor 5/20 daily.   She is a nonsmoker, non drinker and married, lives at home with her  husband.   ALLERGIES:  INTOLERANCE TO STATINS, PENICILLIN, ASPIRIN, SULFA AND  OPIATES WITH HALLUCINATIONS.   PHYSICAL EXAM:  Shows 98.2, 82, 120/60.  She is an ill-appearing female.  HEART:  Regular rate and rhythm.  LUNGS:  Clear.  ABDOMEN:  Ostomy is  functional and pink but is difficult to pass finger  through the fascia today, but is productive of formed stool.  Wound with  brown drainage.   ASSESSMENT/PLAN:  Wound infection, possible fistula.  Admit to [employer], NPO.  IV antibiotics, wound dressings, CT scan today, PICC, TNA.  I did discuss with her husband and [name] the plan and possible  courses of action from here.      Juanetta Gosling, MD  Electronically Signed     MCW/MEDQ  D:  10/28/2008  T:  10/28/2008  Job:  [id number]

## 2011-03-16 NOTE — Op Note (Signed)
[name], [name]              ACCOUNT NO.:  [id number]   MEDICAL RECORD NO.:  [id number]          PATIENT TYPE:  INP   LOCATION:  0005                         FACILITY:  [facility]   PHYSICIAN:  Juanetta Gosling, MDDATE OF BIRTH:  05-May-1936   DATE OF PROCEDURE:  10/11/2008  DATE OF DISCHARGE:                               OPERATIVE REPORT   PREOPERATIVE DIAGNOSIS:  Recurrent diverticulitis.   POSTOPERATIVE DIAGNOSIS:  Recurrent diverticulitis.   PROCEDURES:  1. Laparoscopic mobilization of splenic flexure with laparoscopic      assisted sigmoidectomy.  2. Open exploratory laparotomy.  3. End colostomy with Hartmann's procedure.  4. Left oophorectomy.   SURGEON:  Juanetta Gosling, MD   ASSISTANT:  Lennie Muckle, MD   ANESTHESIA:  General.   FINDINGS:  Significant for diverticulitis consistent with her  microperforation in the left lower quadrant.   SPECIMENS:  Sigmoid colon and left ovary to pathology.   ESTIMATED BLOOD LOSS:  800 mL.   IV FLUIDS:  She received 2 units of packed red blood cells.   COMPLICATIONS:  None.   DISPOSITION:  To PACU and then to stepdown in stable condition.   INDICATIONS FOR PROCEDURE:  [name] is a 75 year old female who I  saw while in the hospital, admitted to the [facility] by Dr.  Virginia Rochester who was diverticulitis proven by a CT scan requiring admission and  was on IV antibiotics.  She has had at least six known episodes that she  has been able to count.  She also states that her left lower quadrant  has really been hurting continuously. She underwent a CT scan in October  of 2009 which showed a tiny focus of extramural gas adjacent to the  sigmoid colon.  She resolved this episode and she returned to see me in  the office for evaluation for a sigmoid colectomy.  She did undergo a  colonoscopy in 2000 by Dr. Jarold Motto for which showed only diverticula  present.  I had a long discussion with her in the office prior to  doing  this about possibly an open procedure as well as a loop ileostomy or  either a temporary end colostomy dependent on how the procedure went.  She understood this.  She got clearance from Dr. Rudi Heap and we  proceeded with her operation.   PROCEDURE IN DETAIL:  After informed consent was obtained the patient  was first administered 1 g of Invanz.  She then had sequential  compression devices placed on her lower extremities.  She was then  placed in low lithotomy and appropriately padded.  Her abdomen was then  prepped and draped in sterile surgical fashion.  A surgical time-out was  then performed.  A 5 mm Optiview was then inserted in the left upper quadrant without  difficulty.  She tolerated this well.  Her abdomen was insufflated to 15  mmHg pressure without a problem.  A 10 mm port was then inserted after  infiltration with local anesthetic under direct vision 3 cm below the  umbilicus without difficulty.  A further 5 mm port  was placed in the  right lower quadrant also under direct vision without complication.  Her  sigmoid was then identified.  This was noted to be very adherent to her  sidewall.  A combination of sharp dissection and cautery was then used  to remove her sigmoid from the sidewall.  Her sigmoid was noted to be  very adherent to her ovary and her fallopian tube and this was dissected  off with care.  Her ureter was identified throughout the procedure and  was preserved.  The left colon was then rolled medial.  The white line  of Toldt was taken down.  Eventually I inserted a HandPort in the low  midline.  I inserted my hand and completed the laparoscopic mobilization  of her splenic flexure taking her omentum off to midway in her  transverse colon and mobilizing her colon completely from her spleen.  This was going well and we had enough length.  The area was freed up  enough that we then decided to take the GelPort out, take the  laparoscopic equipment  out and pull the sigmoid colon out.  A proximal  margin was identified where this was soft.  This was stapled.  We then  noticed two other diverticula right behind this.  This was then stapled  off.  The LigaSure was then used to remove the sigmoid colon until we  got on to the rectum.  At this point also it was noted that the left  ovary was very adherent to this area and was bleeding.  I did elect to  place a Kelly clamp around the stalk of the left ovary.  I removed this  and over sewed this with a 0 Vicryl suture.  I then inserted a contoured  stapler onto the rectum and divided her rectum easily.  The sigmoid was  then passed off the table as a specimen.  The end of the colon was then  freshened up and the fat was removed.  The colon and the rectum were  both noted to be very thin walled at this time and her tissue was not  very healthy.  I did elect to attempt staple anastomosis with EEA sizers  and elected to use a 28 mm stapler.  The anvil was inserted into the  proximal end.  Dr. Freida Busman then went below and inserted the 28 mm stapler.  This was brought up without difficulty.  Again, her rectum was very thin  in this region.  The spike was brought out and everything looked to be  in good position.  I then put the colon onto this and the stapler was  then fired.  This was removed.  Upon removal one of the distal donuts  did not appear to be complete and when we tested it there was a large  leak of air from the anastomosis.  I was very concerned about this and I  elected to take this anastomosis down and then performed a hand sewn  anastomosis using a 3-0 Vicryl interrupted sutures with Connell sutures  around the corners.  This was completed without difficulty but again her  tissue in several places in her rectum ripped just with placement of the  needle.  Eventually I did perform this so I was technically satisfied  with the anastomosis.  This still had a large leak in it upon   completion.  I think this is possibly due to the inflammation that she  had in this region  prior to this.  I did not want to take this farther  down so I elected at this point due to my concern of a leak and her  safety to perform end colostomy.  If I was able to attempt to get this  anastomosis to where I was satisfied with it I would have placed an end  ileostomy but I was not able to get to that point so I over sewed the  rectum with a 3-0 Vicryl suture, placed a Blake drain in this region and  ended up deciding to place an end colostomy.  The end colostomy was  brought out in the left mid abdomen without difficulty.  Again, when I  inserted my fingers to begin with the colostomy after I entered the  fascia my fingers went immediately through the peritoneum indicating the  health of her tissue.  She also during this period dropped her blood  pressure somewhat but I think it was mostly due to volume and she ended  up receiving 2 units of red blood cells during this portion of the  procedure.  The colostomy was then brought up.  Her wound was then  closed with a #1 PDS.  I did staple it in 3 or 4 places and then pack it  open.  The colostomy was then matured with a 4-0 Vicryl and a colostomy  bag was applied to this.  Sterile dressings were placed over this.  She  ended up tolerating this well.  She is going to the PACU in stable  condition after being extubated.  She will end up in the stepdown unit  as the case took a very long time given her disease process.      Juanetta Gosling, MD  Electronically Signed     MCW/MEDQ  D:  10/11/2008  T:  10/11/2008  Job:  [id number]   cc:   Ernestina Penna, M.D.  Fax: [phone]   Vania Rea. Jarold Motto, MD, FACG, FACP, FAGA  [address]  [place]  [place] [address]

## 2011-03-16 NOTE — Discharge Summary (Signed)
NAMEMARJARIE, IRION              ACCOUNT NO.:  [id number]   MEDICAL RECORD NO.:  [id number]          PATIENT TYPE:  INP   LOCATION:  1419                         FACILITY:  [facility]   PHYSICIAN:  Peggye Pitt, M.D. DATE OF BIRTH:  02-01-1936   DATE OF ADMISSION:  08/02/2008  DATE OF DISCHARGE:  08/05/2008                               DISCHARGE SUMMARY   DISCHARGE DIAGNOSES:  1. Recurrent sigmoid diverticulitis.  2. Type 2 diabetes.  3. Hypotension.  4. Gastroesophageal reflux disease.  5. Osteoporosis.   DISCHARGE MEDICATIONS INCLUDE:  1. Ciprofloxacin 500 mg p.o. b.i.d. for 12 more days.  2. Flagyl 500 mg p.o. b.i.d. for 12 more days.  3. Zofran 4 mg 1 tablet every 6 hours as needed for nausea.  4. Amaryl 20 mg p.o. daily.  5. Actos 30 mg p.o. daily.  6. Antara 130 mg p.o. daily.  7. Boniva 1 tablet 150 mg monthly.  8. Os-Cal D 500 mg daily.  9. Fish oil 2 tablets daily.  10.Nexium 20 mg p.o. b.i.d.  11.Vitamin D daily.  12.Azor 5/20 mg p.o. daily.   DISPOSITION AND FOLLOWUP:  The patient is discharged home in stable  condition.  She no longer has left lower quadrant abdominal pain, and  she has tolerated a full day's worth of solid meals without any emesis.  She will follow up with Dr. Dwain Sarna in a week's time for consultation  regarding elective sigmoid colectomy given many recurrent episodes in  the past month.  She will also follow up with her primary care  physician, Dr. Rudi Heap on a p.r.n. basis for the rest of her  chronic medical issues.   CONSULTATIONS IN THIS HOSPITALIZATION:  With Dr. Jarold Motto, GI and with  Dr. Dwain Sarna, [facility].   IMAGES PERFORMED DURING THIS HOSPITALIZATION:  Include CT of the abdomen  and pelvis with contrast on August 02, 2008, consistent with no evidence  of acute abnormality within the abdomen and a sigmoid diverticulitis  with a tiny focus of extraluminal gas and no evidence of an abscess.   HISTORY OF  PRESENT ILLNESS:  For full details, please refer to H and P  dictated by Dr. Della Goo on August 02, 2008, but in brief, Ms.  Lacorte is a very pleasant 75 year old white woman who presented to  the emergency department with complaints of severe worsening of the left  lower quadrant abdominal pain, which started about 1 day prior to  admission.  She denied any diarrhea, but did report beginning of nausea  and vomiting.  She denied any hematemesis, fevers, chills, chest pain,  or shortness of breath.   HOSPITAL COURSE BY ACTIVE PROBLEMS:  Problem #1.  Recurrent sigmoid  diverticulitis.  Upon admission, was placed n.p.o. on Cipro and Flagyl  as well as was given aggressive antiemetics and pain control regimen.  Given the recurrence of episodes, we consulted Surgery, who has  recommended an elective sigmoid colectomy, and she will follow up with  Dr. Dwain Sarna as an outpatient for this issue.  At the time of  discharge, she has 12 more days left  of her antibiotic therapy.  She  will be provided with prescriptions upon discharge.  Problem #2.  All of her other chronic medical issues were stable  throughout her hospitalization and we maintained her on her home  medications.   VITAL SIGNS ON DAY OF DISCHARGE:  Blood pressure 142/52, heart rate 57,  respirations 20, and O2 sats 98% on room air with a temperature 98.4.      Peggye Pitt, M.D.  Electronically Signed     EH/MEDQ  D:  08/05/2008  T:  08/06/2008  Job:  [id number]   cc:   Juanetta Gosling, MD  [address]  [place] [address]   Ernestina Penna, M.D.  Fax: [phone]   Vania Rea. Jarold Motto, MD, FACG, FACP, FAGA  [address]  [place]  [place] [address]

## 2011-03-16 NOTE — Discharge Summary (Signed)
[name], [name]              ACCOUNT NO.:  [id number]   MEDICAL RECORD NO.:  [id number]          PATIENT TYPE:  INP   LOCATION:  1512                         FACILITY:  [facility]   PHYSICIAN:  Juanetta Gosling, MDDATE OF BIRTH:  1936-04-02   DATE OF ADMISSION:  04/01/2009  DATE OF DISCHARGE:  04/03/2009                               DISCHARGE SUMMARY   ADMISSION DIAGNOSES:  1. Inability to pass stool from her colostomy.  2. Diabetes.  3. Gastroesophageal reflux disease.  4. Osteoporosis.  5. Hypertension.   DISCHARGE DIAGNOSES:  1. Inability to pass stool from her colostomy.  2. Diabetes.  3. Gastroesophageal reflux disease.  4. Osteoporosis.  5. Hypertension.   PROCEDURE PERFORMED:  On April 01, 2009, a local colostomy revision for  stomal stenosis.   PERTINENT RADIOLOGIC EXAMINATIONS:  She had an ultrasound of her left  lower extremity to rule out DVT due to left calf pain that was negative  on April 02, 2009.   PERTINENT LABORATORY EXAMINATION:  None.   HISTORY AND HOSPITAL COURSE:  [name] is a 75 year old female who  is well known to me from a sigmoidectomy for diverticulitis about 6  months ago.  This was complicated by stomal cells postoperatively which  I have been dilating over some time.  She had been doing well.  At some  point, her colostomy was manipulated and had a perforation lateral to  her abdominal wall where she underwent a drainage of this area.  Since  then the colostomy has gotten progressively more stenotic.  I saw her in  the office on April 01, 2009, with some significant abdominal pain, really  not passing any stool out of her ostomy.  She and I discussed admission  to the hospital, as well as to go into the operating room and revising  her stoma.  I did take her to the operating room where I did a local  colostomy revision for what was just stomal stenosis.  The remainder of  the colostomy was pink and viable.  She did well postoperatively  from  this.  She began passing a large amount of gas and stool fairly quickly  and by postoperative day #2 she remained afebrile, was tolerating a  diet, ambulating, pain controlled on oral medication.  She had a  negative lower extremity ultrasound for some mild calf pain as well.   FOLLOWUP:  Was with [facility] and Dr. Dwain Sarna in 2  weeks.   MEDICATIONS UPON DISCHARGE:  1. Amaryl 20 mg daily.  2. Antara 130 mg daily.  3. Boniva 150 mg month.  4. Os-Cal.  5. Fish oil.  6. Nexium 20 mg b.i.d.  7. Azor 5/20 daily.  8. Vitamin D.  9. Percocet as needed for pain.  10.Colace 100 mg b.i.d.  11.MiraLax once daily.      Juanetta Gosling, MD  Electronically Signed     MCW/MEDQ  D:  05/06/2009  T:  05/06/2009  Job:  [id number]

## 2011-03-16 NOTE — H&P (Signed)
[name], [name]              ACCOUNT NO.:  [id number]   MEDICAL RECORD NO.:  [id number]          PATIENT TYPE:  INP   LOCATION:  NA                           FACILITY:  [facility]   PHYSICIAN:  Marlowe Kays, M.D.  DATE OF BIRTH:  24-Sep-1936   DATE OF ADMISSION:  07/11/2007  DATE OF DISCHARGE:                              HISTORY & PHYSICAL   CHIEF COMPLAINT:  Pain in my right knee   HISTORY OF PRESENT ILLNESS:  This 75 year old white female is seen by [name]  for continued progressive problems concerning pain in her right knee.  Years ago she underwent a knee reconstruction secondary to damage to her  left knee by Dr. Orland Jarred.  Over the years, she has had progressive  problems concerning the knee with minor to major instability and now has  presented with considerable pain and discomfort with range of motion  having more and more difficulty getting about due to the pain in her  knee and it has affected her overall quality of life.  X-rays have shown  deterioration of the joint specifically in the medial joint with bone-on-  bone deformity.  We have tried viscosupplementation to the knee,  strengthening exercises and other modalities which really have not  helped her.  After much discussion including the risks and benefits of  surgery, it was decided to go ahead with total knee replacement  arthroplasty to the right knee.   Her preoperative laboratory findings on her preop visit, June 29, 2007, showed that she had a elevated glucose at 171.  GFR was 48/58.  She also had an anemia with hemoglobin of 10.9, hematocrit 31.9.  (These  values were carried by the patient to Dr. Rudi Heap, her family  physician).   The patient has been cleared preoperatively by Dr. Ernestina Penna of  [facility].   PAST MEDICAL HISTORY:  1. This patient has a history of bronchitis 2007.  2. She is also being treated for hypertension.  3. She has a hiatal hernia and reflux.   Must sleep with the head of      the bed elevated.  4. She has diverticulitis.  5. Intermittent urinary incontinence.  6. She also has a considerable amount of nocturia.  7. She is non-insulin-dependent diabetic.  8. She has had a history of anemia in the past.  She had a transfusion      1962.  9. History of fracture of her right foot in the past.  10.Degenerative disk disease in the low back.   PAST SURGERIES:  1. Tonsillectomy in 1956.  2. Nasal reconstruction in 1973.  3. Right oophorectomy in 1967.  4. Right knee arthroscopies 1980, 1990 and 1998.  5. Surgeries to the left and right wrist.  6. She had right shoulder scope in 2000, left shoulder 2002.   ALLERGIES:  PENICILLIN, CODEINE, ASPIRIN, SULFA and she has adverse  effects with MORPHINE SULFATE.   CURRENT MEDICATIONS:  1. Azor 100 mg daily.  2. Actos 15 mg 1/2 tablet b.i.d.  3. Amaryl 4 mg tab daily.  4. Lestine Mount  130 mg daily.  5. Boniva 150 mg tab monthly.  6. Lovaza, she spells it, one b.i.d.  7. Nexium 40 mg one b.i.d.  8. Cholesterol Plus vitamin.  9. Calcium.  10.Sanctura XP 50 mg every other day.  11.Singulair p.r.n.  12.Stool softener two daily.   FAMILY HISTORY:  Positive for heart disease in her mother and father.  Hypertension in mother.  Diabetes in the mother and sister.  Cancer in  the grandfather, prostate in nature.  Mother died of a stroke at 65  years of age.  Arthritis in the mother and sister and the father with  ulcers and stomach problems.   SOCIAL HISTORY:  The patient is married.  She is a retired  Chartered loss adjuster.  No intake of alcohol or tobacco products.  She has two  children.  Caregiver after surgery will be her husband.  She lives in a  one-story house with 2-3 stairs.  Dr. Rudi Heap of [facility] is her family physician.   PHYSICAL EXAMINATION:  GENERAL APPEARANCE:  Alert, cooperative,  friendly, fully alert and oriented 75 year old white female who is   accompanied by her husband, [name].  VITAL SIGNS:  Blood pressure 142/62 seated right arm, pulse 80 regular,  respirations 12 unlabored.  HEENT:  Normocephalic, PERRLA.  EOMI intact.  Oropharynx is clear.  CHEST:  Clear to auscultation, no rhonchi, no rales.  HEART:  Regular rate and rhythm.  No murmurs are heard.  ABDOMEN:  Soft, obese.  Liver and spleen not felt.  GENITALIA/RECTAL/PELVIC/BREASTS:  Not done, not pertinent to present  illness.  EXTREMITIES:  The patient has a valgus deformity of the right knee with  crepitus on range of motion and tenderness to palpation of the medial  joint line.  NEUROVASCULAR:  Intact in right lower extremity.  She can dorsiflex the  foot.   ADMISSION DIAGNOSES:  1. Osteoarthritis of the right knee.  2. Type 2 diabetes.  3. Dyslipidemia.  4. Anemia.  5. Reflux.  6. Hypertension.   PLAN:  The patient will be admitted for total knee replacement  arthroplasty to the right knee.  We will put her on a diabetic protocol  postoperatively.      Dooley L. [name].    ______________________________  Marlowe Kays, M.D.    DLU/MEDQ  D:  06/30/2007  T:  07/01/2007  Job:  [id number]   cc:   Ernestina Penna, M.D.  Fax: [phone]

## 2011-03-16 NOTE — Consult Note (Signed)
[name], [name]              ACCOUNT NO.:  [id number]   MEDICAL RECORD NO.:  [id number]          PATIENT TYPE:  INP   LOCATION:  1419                         FACILITY:  [facility]   PHYSICIAN:  Juanetta Gosling, MDDATE OF BIRTH:  06-13-1936   DATE OF CONSULTATION:  08/04/2008  DATE OF DISCHARGE:                                 CONSULTATION   This is a consult for the [facility] H-team as well as Dr. Sabino Gasser.   CHIEF COMPLAINT:  Left lower quadrant abdominal pain.   HISTORY OF PRESENT ILLNESS:  [name] is a 75 year old female who  went to the emergency room on October 2 with left lower quadrant pain  that began on October 1.  Denied having any loose stools, vomiting, or  fever.  She did begin to have some nausea associated with this.  She  does state that she has had at least 6 prior episodes of diverticulitis  since 2000, some of which have required hospital admission.  She reports  she had a colonoscopy by Dr. Sheryn Bison in 2007, she thinks.  She  has been admitted since October 2 here on IV ciprofloxacin and Flagyl,  and currently on my evaluation today, she is doing much better.  Denies  any abdominal pain, is tolerating clears.  White blood cell count has  returned to normal and she is afebrile.   PAST MEDICAL HISTORY:  Significant for the diverticulitis, type 2  diabetes, gastroesophageal reflux disease, osteoporosis, hypertension.   PAST SURGICAL HISTORY:  Includes right ovarian cystectomy, bilateral  wrist surgeries, and bilateral shoulder surgeries.  Prior right total  knee replacement by Dr. Simonne Come.   MEDICATIONS:  1. Actos 30 mg q. a.m.  2. Antara 130 mg p.o. at noon.  3. Glimepiride 4 mg q. a.m. and 1/2 tab q. p.m.  4. Boniva 150 mg monthly.  5. Nexium 40 b.i.d.  6. Colace p.r.n.  7. Fish oil 1000 mg 2 tabs b.i.d.  8. Singulair 10 mg p.o. daily p.r.n.  9. Azor 520 mg 1 p.o. q. a.m.  10.Multivitamin.  11.Calcium with vitamin D 600 mg 1  daily.   ALLERGIES:  She has an intolerance to CRESTOR, LESCOL, LIPITOR, and  MEVACOR, which cause myalgias.  PENICILLIN causes GI upset.  ASPIRIN  causes increased reflux.  PENICILLIN and SULFA cause rashes.  Multiple  OPIATE PAIN MEDICATIONS have caused hallucinations and delirium.   SOCIAL HISTORY:  She is a nonsmoker, nondrinker.  She is married and  lives with her husband.   REVIEW OF SYSTEMS:  She denies any cardiac or pulmonary disease.   PHYSICAL EXAMINATION:  VITAL SIGNS:  Today shows a temperature of 98.3,  pulse 68, blood pressure 132/55.  She is satting 95% on room air.  GENERAL:  She is a 75 year old obese female in no acute distress.  LUNGS:  Clear.  HEART:  Regular rate and rhythm.  ABDOMEN:  Soft.  Nontender with bowel sounds present, obese.  She has a  well-healed transverse lower abdominal scar.  EXTREMITIES:  Without edema.   LABORATORY EXAMINATION:  Today, shows a sodium 138, potassium  3.3,  chloride 109, CO2 25, glucose 146, BUN 5, creatinine 0.84.  Her white  blood cell count is 8.5, hematocrit 28.9, and her platelets are 176.  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  No evidence of abscess or bowel obstruction.   ASSESSMENT:  22. A 75 year old female with acute diverticulitis.  2. Diabetes.  3. Hypertension.   PLAN:  Results of the CAT scan were discussed with Dr. Michaell Cowing on Friday,  who agreed with the current therapy.  She is doing much better on IV  antibiotics right now, and I think that she will be able to have her  diet advanced, sent home on oral antibiotics, and scheduled for an  elective sigmoid colectomy.  I will continue to see her while she is in  the hospital.  I have given them my card, and we will see her 1 to 2  weeks after discharge.  There are some scheduling  issues that her  husband is due for surgery in early November, so we discussed a possible  date  as long as she continues to do well, so we can perform her  laparoscopic and possible open sigmoid colectomy at the appropriate  time.  I discussed this with her and her husband today and they are both  agreeable to this plan.      Juanetta Gosling, MD  Electronically Signed     MCW/MEDQ  D:  08/04/2008  T:  08/04/2008  Job:  [id number]   cc:   Georgiana Spinner, M.D.  Fax: [phone]   [facility]

## 2011-03-16 NOTE — H&P (Signed)
[name], [name]              ACCOUNT NO.:  [id number]   MEDICAL RECORD NO.:  [id number]          PATIENT TYPE:  INP   LOCATION:  1419                         FACILITY:  [facility]   PHYSICIAN:  Della Goo, M.D. DATE OF BIRTH:  Mar 16, 1936   DATE OF ADMISSION:  08/02/2008  DATE OF DISCHARGE:                              HISTORY & PHYSICAL   PRIMARY CARE PHYSICIAN:  Dr. Vernon Prey.   CHIEF COMPLAINT:  Left lower quadrant abdominal pain.   HISTORY OF PRESENT ILLNESS:  This is a 75 year old female presenting to  the emergency department with complaints of severe worsening left lower  quadrant abdominal pain which started 1 day ago at about noon.  She  denies having any diarrhea associated with this discomfort.  She denies  having any melena or hematochezia.  She does report beginning to have  nausea and vomiting.  Denies having any hematemesis or bilious emesis.  She reports that she has had eight previous episodes of diverticulitis  in the past and states that this feels similar.  She denies having any  fevers, chills, chest pain or shortness of breath.   PAST MEDICAL HISTORY:  Significant for diverticulitis, type 2 diabetes  mellitus, hypertension, gastroesophageal reflux disease, and  osteoporosis.   PAST SURGICAL HISTORY:  History of three previous knee surgeries and a  total knee replacement for total of four knee surgeries.  She reports  having bilateral wrist surgeries and bilateral shoulder surgeries and a  right ovarian cyst surgery.   MEDICATIONS:  Her medications include Actos 30 mg 1 p.o. q.a.m., Antara  130 mg p.o. at noon, glimepiride 4 mg 1 tablet p.o. q.a.m. and 1/2 of a  tablet p.o. q.p.m., Boniva 150 mg monthly, Nexium 40 mg 1 p.o. b.i.d.,  stool softener Colace as needed,  fish oil 1000 mg 2 tablets p.o.  b.i.d., Singulair 10 mg p.o. daily p.r.n., Azor 5/20 mg 1 p.o. q.a.m.,  vitamin 1 p.o. daily, calcium with vitamin D 600 mg 1 p.o. daily.   ALLERGIES/INTOLERANCES:  Patient has an intolerance to CRESTOR, LESCOL,  LIPITOR, MEVACOR which caused myalgias.  PENICILLIN makes her sick, a GI  upset.  ASPIRIN causes increased reflux symptoms. PENICILLIN and SULFA  caused rashes. OPIATE PAIN MEDICATIONS caused  hallucinations, and VIOXX  causes GI upset.   SOCIAL HISTORY:  The patient is married.  She is a nonsmoker,  nondrinker.   FAMILY HISTORY:  Noncontributory.   REVIEW OF SYSTEMS:  Pertinent as mentioned above.   PHYSICAL EXAMINATION:  GENERAL:  This is a 75 year old obese female in  discomfort but no acute distress.  VITAL SIGNS: Temperature 98.7, blood pressure 134/55, heart rate 87,  respirations 18, O2 saturations 96%-97%.  HEENT EXAMINATION:  Normocephalic, atraumatic.  There is no scleral  icterus.  Pupils are equally round and reactive to light.  Extraocular  movements are intact, funduscopic benign. Oropharynx is clear.  NECK:  Supple, full range of motion.  No thyromegaly, adenopathy,  jugular venous distention.  CARDIOVASCULAR:  Regular rate and rhythm.  No murmurs, gallops or rubs.  LUNGS: Clear to auscultation bilaterally.  ABDOMEN: Positive  bowel sounds, soft, nontender, nondistended.  EXTREMITIES: Without cyanosis, clubbing or edema.  NEUROLOGIC EXAMINATION:  Nonfocal.   LABORATORY STUDIES:  White blood cell count 13.5, hemoglobin 11.7,  hematocrit 34.8, platelets 214, neutrophils 79%, lymphocytes 12%, MCV  94.5, sodium 139, potassium 3.4, chloride 103, carbon dioxide 25, BUN  16, creatinine 1.09 and glucose 146, albumin 3.2, AST 25, ALT 20, lipase  10.  Urinalysis small leukocyte esterase, trace urine hemoglobin.   CT scan of the abdomen reveals sigmoid diverticulitis with a tiny focus  of extraluminal, gas no evidence of abscess.   ASSESSMENT:  A 75 year old female being admitted with:  1. Left lower quadrant abdominal pain.  2. Acute diverticulitis.  3. Small pneumoperitoneum.  4. Urinary tract  infection.  5. Mild hyperglycemia with type 2 diabetes mellitus.   PLAN:  The patient will be admitted and will be placed on IV antibiotic  therapy of ciprofloxacin and Flagyl at this time.  The results of the  CAT scan were discussed with general surgery, Dr. Karie Soda, who  reviewed the CAT scan films and agreed with IV antibiotic therapy for  now and a repeat scan in 3 days to evaluate for improvement or  progression of the pneumoperitoneum.  He states that the area is small  and should localize itself.  He stated that general surgery would be  glad to evaluate the patient further as need be.  The patient will be  placed on clear liquids for now for bowel rest and IV fluids.  Pain  control therapy has been ordered.  However, the patient does have  multiple intolerances and will be monitored for further changes.  Gastroenterology Dr . Virginia Rochester was called by the emergency department  physician regarding the patient and he states that he will see the  patient in a.m..      Della Goo, M.D.  Electronically Signed     HJ/MEDQ  D:  08/03/2008  T:  08/03/2008  Job:  [id number]

## 2011-03-16 NOTE — H&P (Signed)
NAMEKORIE, STREAT              ACCOUNT NO.:  [id number]   MEDICAL RECORD NO.:  [id number]          PATIENT TYPE:  INP   LOCATION:  1512                         FACILITY:  [facility]   PHYSICIAN:  Juanetta Gosling, MDDATE OF BIRTH:  1936/07/20   DATE OF ADMISSION:  04/01/2009  DATE OF DISCHARGE:                              HISTORY & PHYSICAL   CHIEF COMPLAINT:  Inability to pass stool via her colostomy.   HISTORY OF PRESENT ILLNESS:  Ms. Cannell is a 75 year old female who  is well known to me from a sigmoidectomy for diverticulitis  approximately 6 months ago.  This was complicated by stomal stenosis  postoperatively which I have been dilating over some time and she has  been doing well from.  She also at some point had her colostomy  manipulated and had a perforation lateral in her abdominal wall.  Since  then, this area has gotten progressively more stenotic.  She also has a  ventral hernia at her prior incision as well.  She underwent a colostomy  initially due to the chronicity of her diverticulitis and the condition  of her bowel, and was unable to be reanastomosed at that time.  She has  actually done much better since then and has improved her nutrition and  improved her activity over this time.  She and I have discussed  returning to either revise her colostomy or attempting a takedown which  I think would be a reasonable plan.  Today, she comes in as I was going  to redilate her again and she has significant lower abdominal pain and  really is unable to pass much stool out of her ostomy right now.  She  denies any fevers.  She has eaten, not nauseated or vomiting.  She is  tolerating her diet.  she is not able to tolerate dilation in the office  today.   PAST MEDICAL HISTORY:  1. Diabetes.  2. Gastroesophageal reflux disease.  3. Osteoporosis.  4. Hypertension.   PAST SURGICAL HISTORY:  1. Right ovarian cystectomy.  2. Bilateral wrist surgery.  3. Bilateral  shoulder surgery.  4. Sigmoidectomy.   SOCIAL HISTORY:  She is a nonsmoker, nondrinker.  She lives with her  husband, [name].   DRUG ALLERGIES:  1. PENICILLIN.  2. ASPIRIN.  3. SULFA.  4. OPIATE PAIN MEDICATIONS.  5. STATINS.  Some of these are intolerances.   MEDICATIONS:  1. Amaryl 20 mg daily.  2. Antara 130 mg daily.  3. Boniva 150 mg q. month.  4. Os-Cal.  5. Fish oil.  6. Nexium 20 b.i.d.  7. Vitamin D.  8. Azor 5/20 daily.   REVIEW OF SYSTEMS:  Negative for any cardiac or pulmonary disease.   PHYSICAL EXAMINATION:  VITAL SIGNS:  96.7, 74, 155/68.  GENERAL:  She is an ill-appearing female in no distress today.  NECK:  Supple without adenopathy.  HEART:  Regular rate and rhythm.  LUNGS:  Clear bilaterally.  ABDOMEN:  She has a stomal stenosis.  I attempted to dilate this today  and was unable to do this without causing her significant  amount of  pain.  She has a reducible ventral hernia.  She is mildly tender around  her stoma.  EXTREMITIES:  Show no edema.   LAB/X-RAYS:  Both pending at this time.   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   PLAN:  Admission and IV fluids.  I am going to take her to the operating  room and attempt to dilate, and do some local treatment on the stoma.  I  am not entirely sure that this is going to work in which case we  discussed exploratory laparotomy for either colostomy revision.  I  also  discussed with her potentially evaluating her for reanastomosis,  although in an emergency setting, this would not be an ideal plan.  If  we did that, she would need an ileostomy obviously as well.  I also told  her I would fix her hernia with biologic mesh.  She and her husband both  understand this and we will plan on proceeding later today.      Juanetta Gosling, MD  Electronically Signed     MCW/MEDQ  D:  04/01/2009  T:  04/01/2009  Job:  [id number]   cc:   Ernestina Penna, M.D.  Fax: [phone]

## 2011-03-16 NOTE — Op Note (Signed)
[name], [name]              ACCOUNT NO.:  [id number]   MEDICAL RECORD NO.:  [id number]          PATIENT TYPE:  INP   LOCATION:  1305                         FACILITY:  [facility]   PHYSICIAN:  Juanetta Gosling, MDDATE OF BIRTH:  [birth year]   DATE OF PROCEDURE:  DATE OF DISCHARGE:                               OPERATIVE REPORT   PREOPERATIVE DIAGNOSIS:  Left lower quadrant abdominal wall abscess.   POSTOPERATIVE DIAGNOSIS:  Left lower quadrant abdominal wall abscess.   PROCEDURE:  Incision and drainage of left lower quadrant abdominal wall  abscess and abdominal wound VAC change.   ASSISTANT:  None.   ANESTHESIA:  General.   FINDINGS:  Abscess cavity next to her left lower quadrant colostomy. No  communication identified   SPECIMENS:  Cultures to microbiology.   ESTIMATED BLOOD LOSS:  Minimal.   COMPLICATIONS:  None.   DRAIN:  A Penrose to the left lower quadrant abscessed cavity.   DISPOSITION:  To recovery room in stable condition.   HISTORY:  [name] is a 75 year old female with a history of  diabetes status post open sigmoidectomy with Hartmann pouch and end  colostomy complicated by a wound infection of her midline and stomal  stenosis.  She came to the office today with erythema lateral to her  colostomy as well as some purulence draining from her stoma.  She had  been having this area dilated in the office and I think this was related  to one of her recent dilations.  I admitted her to [facility] and gave  her some IV antibiotics and to undergo a CT scan which only showed this  area in her abdominal wall that was concerning to me.  I then counseled  her for I&D of this abscess and also talked at length with her and her  husband about the possible need in the future for a revision of this  ostomy.   PROCEDURE IN DETAIL:  After informed consent was obtained, the patient  was administered 1 gm of intravenous Invanz.  She had sequential  compression  devices placed on her lower extremities prior to induction.  She was taken to the operating room where she was placed under general  anesthesia without complication.  Her abdomen was then prepped and  draped in a standard sterile surgical fashion after removing her  colostomy bag.  A surgical time-out was then performed.   A 2-1/2 cm incision was then made out lateral to the area of the abscess  as I wanted to make sure her appliance would fit afterwards.  Dissection  was carried down to the level of the abscess with a large amount of  white purulence returning.  This was cultured and sent to microbiology.  This area was copiously irrigated and all of the infection was evacuated  from this region.  I then placed a 1/2 inch Penrose drain throughout  this area and sutured it into place with a nylon suture at the exit of  the wound.  I then placed an ostomy bag overlying.  I could not identify  a hole into her  stoma.  I then placed an ostomy bag and a dressing over  this.  Following this, I then removed her abdominal VAC and I did  debride further some of the fat necrosis she had where a prior JP had  been placed.  I then elected to place a wet-to-dry dressing over this.  The dry dressing was then placed overlying this.   She tolerated this well and was extubated in the operating room and  transferred to the recovery room in stable condition.      Juanetta Gosling, MD  Electronically Signed     MCW/MEDQ  D:  12/10/2008  T:  12/11/2008  Job:  [id number]

## 2011-03-16 NOTE — Discharge Summary (Signed)
[name], [name]              ACCOUNT NO.:  [id number]   MEDICAL RECORD NO.:  [id number]          PATIENT TYPE:  INP   LOCATION:  1533                         FACILITY:  [facility]   PHYSICIAN:  Juanetta Gosling, MDDATE OF BIRTH:  1936/09/17   DATE OF ADMISSION:  10/11/2008  DATE OF DISCHARGE:  10/25/2008                               DISCHARGE SUMMARY   Her admission date was October 11, 2008, and her discharge date was  October 25, 2008.   ADMISSION DIAGNOSES:  1. Recurrent_ diverticulitis.  2. Hypertension.  3. Non-insulin-dependent diabetes mellitus.  4. Gastroesophageal reflux disease.  5. Osteoporosis.   DISCHARGE DIAGNOSES:  1. Status post sigmoidectomy and Hartmann procedure.  2. Hypertension.  3. Non-insulin-dependent diabetes mellitus.  4. Gastroesophageal reflux disease.  5. Osteoporosis.  6. Nausea.   PROCEDURE PERFORMED DURING HOSPITALIZATION:  On October 12, 2008, she  had laparoscopic mobilization of the  splenic flexure, with laparoscopic-  assisted sigmoid colectomy.  This was converted to open when I had  difficulty due to the condition of her bowel with the anastomosis.  I  ended up performing an end colostomy.  She also had a left oophorectomy.   DISCHARGE MEDICATIONS:  Percocet.   FOLLOWUP:  Will be in one week at the office.   HISTORY:  This is a 75 year old female who has had numerous bouts of  diverticulitis, some of which involved a perforation on her CT.  She was  seen in the office to evaluate for a sigmoid colectomy.  Preoperatively  she continued to have symptoms of significant left lower quadrant pain.  We had discussed a laparoscopic possible open sigmoid colectomy.  We did  discuss that she might also have a colostomy, depending upon how the  surgery went.   HOSPITAL COURSE:  Her surgery was very difficult.  She had a lot of  swelling of her diverticulitis and ended up having to perform a subtotal  sigmoid colectomy, which had  evidence of prior perforation as well as  multiple episodes of diverticulitis.  I then had to do a stapled  anastomosis which had a very large leak.  I decided to take this down  and attempted to do a hand sew the anastomosis which then leaked the  same.  Her tissues were very, very friable and would not hold the  sutures well. I elected to perform an end colostomy.  Postoperatively  she had a variable course with a colostomy.  Her colostomy had a  hematoma overlying it, which eventually sloughed off and this was pink.  It had become somewhat stenotic but was working and  looking well on her  discharge.  She had some underlying difficulty with physical therapy and  getting out of bed and moving as well during this period.  Eventually  she started passing material into her bag.  She eventually began  tolerating a mechanical soft diet.  She did have trouble with nausea  throughout, some of which had been present prior to her operation.  We  tried multiple medications to prevent this.  This was better at the end.  Prior to her discharge on the 22nd, I wanted to be sure that there was  no source of her pain or nausea intra-abdominally.  I did obtain a CT  scan at that point.  The CT scan was found to be negative.  She had a  mild fever on the 24th.  Her wound was left open from her surgery and  this looked to appear clean upon her discharge.   She was discharged, with followup to be in one week.      Juanetta Gosling, MD  Electronically Signed     MCW/MEDQ  D:  11/28/2008  T:  11/28/2008  Job:  [id number]   cc:   Ernestina Penna, M.D.  Fax: [phone]

## 2011-03-16 NOTE — Discharge Summary (Signed)
NAMESALMA, [name]              ACCOUNT NO.:  [id number]   MEDICAL RECORD NO.:  [id number]          PATIENT TYPE:  INP   LOCATION:  1527                         FACILITY:  [facility]   PHYSICIAN:  Juanetta Gosling, MDDATE OF BIRTH:  05-14-1936   DATE OF ADMISSION:  10/28/2008  DATE OF DISCHARGE:  11/12/2008                               DISCHARGE SUMMARY   ADMISSION DIAGNOSIS:  Wound infection, status post Hartmann procedure.   SECONDARY DIAGNOSES:  1. Diabetes mellitus, non-insulin dependent.  2. Gastroesophageal reflux disease.  3. Hypertension.  4. Osteoporosis.  5. History of prior diverticulitis.  6. Nausea and vomiting.   PROCEDURES PERFORMED:  CT scan on October 28, 2008.   DISCHARGE DIAGNOSES:  1. Wound infection.  2. Diabetes mellitus.  3. Gastroesophageal reflux disease.  4. Hypertension.  5. Osteoporosis.  6. History of diverticulitis.  7. Open wound.   HOSPITAL COURSE:  [name] is a 75 year old female whom I did a  Hartmann procedure on that included a left oophorectomy.  She returned  to see me in the office on October 28, 2008, where she was having foul-  smelling drainage from her wound.  I probed her wound and got a lot of  foul-smelling drainage from this, and then had her come over to [facility] to be admitted.  A PICC line was placed, and she was begun on TNA  due to her state of malnutrition.  She did get a CT, and this appeared  to be just a wound infection above her fascia at this point, and no  evidence of any fistula.  I did also dilate her stoma, which was working  just fine, with my finger.  This wound underwent debridement several  times and was cleaned up considerably and was undergoing dressing  changes.  She still complained of nausea and some emesis, although her  bowels appeared to be moving well.  There were numerous therapies  attempted for her nausea.  She began to feel better.  I think a lot of  this was due to the  antibiotics  that she was on at that time.  She was doing better, tolerating a  regular diet, and much improved with wound care changes, and eventually  she was discharged on November 12, 2008 without any difficulties when she  was ready to go home.  She was due to follow up with me in 1 week  afterwards.      Juanetta Gosling, MD  Electronically Signed     MCW/MEDQ  D:  11/28/2008  T:  11/28/2008  Job:  [id number]

## 2011-03-16 NOTE — Op Note (Signed)
NAMECRISSY, MCCREADIE              ACCOUNT NO.:  [id number]   MEDICAL RECORD NO.:  [id number]          PATIENT TYPE:  INP   LOCATION:  0008                         FACILITY:  [facility]   PHYSICIAN:  Marlowe Kays, M.D.  DATE OF BIRTH:  1936/02/23   DATE OF PROCEDURE:  07/11/2007  DATE OF DISCHARGE:                               OPERATIVE REPORT   PREOPERATIVE DIAGNOSIS:  Osteo and traumatic arthritis, right knee.   POSTOPERATIVE DIAGNOSIS:  Osteo and traumatic arthritis, right knee.   OPERATION:  Osteonics total knee replacement, right.   SURGEON:  Marlowe Kays, M.D.   ASSISTANT:  Mr. Idolina Primer PA-C   ANESTHESIA:  Spinal.   PATHOLOGY AND JUSTIFICATION FOR PROCEDURE:  She had a knee injury 1981  with repair medial collateral ligament, medial meniscectomy and repair  of the ACL by a now deceased orthopedic surgeon in town.  Se has had  progressive problems with the knee over the years and on plain x-ray has  significant medial compartment arthrosis.  This was confirmed at surgery  as well as a good bit of patellar chondromalacia as well.   PROCEDURE:  Preoperative antibiotics, spinal anesthesia, Foley catheter  inserted.  Pneumatic tourniquet applied along with Sure Foot and lateral  hip stabilizer.  Right leg was prepped with DuraPrep from tourniquet to  ankle, draped in sterile field IV employed.  The leg was Esmarch out  sterilely.  Time-out performed.  A vertical midline incision down to the  patellar mechanism.  Median parapatellar incision to open the joint.  She had a good bit scarring medially.  Her previous incision had been  more medially even than the median parapatellar incision to allow for  the previous surgical repair.  I made the usual vertical midline  incision with median parapatellar incision to open the joint.  Patellar  mechanism was freed up and the patella everted.  I undermined the medial  collateral ligament and pes anserinus off the proximal medial  tibia.  Osteophytes were removed from the femur.  The knee was flexed with  patella everted and a 5/16 inch drill hole made in the distal femur,  followed by canal finder and the axis liner for 5 degree valgus cut for  the right knee.  She had no flexion contracture and I elected to take 10  mm off her distal femur.  I then placed jig for sizing the distal femur  at a +5 and scribe lines were placed on the distal femur to apply the  distal femoral cutting jig and anterior and posterior cuts and posterior  and anterior chamfers were made.  I then returned to the tibia where a  leveling cut was made and tibia was sized at a #5 as well.  I made my  initial intramedullary drill hole followed by step-cut drill, canal  finder and placement of the intramedullary rod to use the external  cutting jig to make a 4 mm cut off the slightly depressed lateral tibial  plateau.  After creating this cut, I then used a bone hook to remove  bone and soft tissue from behind femoral  condyles.  I then placed the  femoral jig for making both the groove for the patella and the  notchplasty after first making mini cuts in the notch with removal of  bone to try and minimize any traumatic fracture.  She had very soft bone  in the femur.  After completing the notchplasty, I then made a repair of  the most medial portion of the medial femoral condyle which on simply  cutting the distal femur initially had flayed open.  I made two parallel  drill holes into the notch and used a #1 Vicryl with a U loop type  incision being tied medially which cinched this fragment down nicely.  We then went through a trial reduction and found that no additional bone  needed to be taken off the tibia with either 10 or 12 mm spacer going to  be appropriate width.  I used the external alignment guide splitting  bimalleolar distance to mark scribe lines on the tibia using the base  plate.  While the knee was in extension I then measured the  patella and  26 patella was appropriate size for the patellaplasty.  I then used the  guide for making 10 mm recess cut which was made and then followed with  the guide for making three fixation drill holes which were placed.  I  then used a trial patella to trim bone from around the patella and then  with the knee flexed and the base plate attached using the previous  scribe lines, I with three pins went ahead and used the tripod apparatus  to ream for the tibial keel up to 5 cemented.  Because of the very soft  bone and a varus deformity I then made the decision to use an 8 mm  extension on the tibial component for better stability and we reamed for  that up to #13 with a hand reamer followed by boss reamer and then went  through a trial reduction with the tibia component in extension and this  fit nicely.  At this point we water picked the knee while the components  were assembled and the methacrylate mixed and then used a glue gun to  methacrylate the individual components.  The tibial component was glue  was applied to the tibial component down to the extension, impacted and  excess methacrylate removed from around the tray.  Then went ahead and  placed methacrylate on the femur which we then placed the femoral  component which I gently impacted and again removed excess methacrylate.  I then placed 10 mm spacer with knee in extension, glued in the patellar  component which I held with the patellar clamp.  When methacrylate  hardened, we removed remnants of methacrylate from around the three  components and went through another trial reduction this time with a 12-  mm spacer which seemed to give a better fit.  It had serial extension  and good medial lateral stability.  Accordingly elected to proceed with  a 12 mm posterior stabilized spacer.  We once again irrigated the knee  well and checked for any pieces of methacrylate which might be  remaining.  The spacer was placed impacted and  the knee reduced and  found to have excellent motion with good stability, no lateral release  was required.  I then placed a Hemovac and closed the wound in layers  with interrupted #1 Vicryl in two layers in the quadriceps tendon, two  layers distally in synovium  and capsule. Subcu tissue was closed  combination of #1-0 and #2-0 Vicryl, staples in the skin.  Betadine  Adaptic dry sterile dressing were applied.  Tourniquet was released with  slightly less than 2 hours of tourniquet time at 300 mmHg.  She was  taken recovery room satisfactory condition with no known complications.           ______________________________  Marlowe Kays, M.D.     JA/MEDQ  D:  07/11/2007  T:  07/12/2007  Job:  [id number]

## 2011-03-16 NOTE — Op Note (Signed)
[name], [name]              ACCOUNT NO.:  [id number]   MEDICAL RECORD NO.:  [id number]          PATIENT TYPE:  AMB   LOCATION:  DSC                          FACILITY:  [facility]   PHYSICIAN:  Juanetta Gosling, MDDATE OF BIRTH:  October 02, 1936   DATE OF PROCEDURE:  05/19/2009  DATE OF DISCHARGE:                               OPERATIVE REPORT   PREOPERATIVE DIAGNOSIS:  Left breast abnormal mammogram.   POSTOPERATIVE DIAGNOSIS:  Left breast abnormal mammogram.   PROCEDURE:  Left breast wire localization biopsy.   SURGEON:  Juanetta Gosling, MD.   ASSISTANT:  None.   ANESTHESIA:  General.   SPECIMENS:  Left breast tissue to pathology.   ESTIMATED BLOOD LOSS:  Minimal.   COMPLICATIONS:  None.   DRAINS:  None.   DISPOSITION:  To recovery room in stable condition.   HISTORY:  [name] is a 75 year old female, well known to me from  sigmoidectomy with a colostomy for very complicated diverticular  disease.  She has had multiple complication of this including a wound  infection as well as stomal stenosis, but is now doing fairly well.  Routine screening mammogram was performed recently.  She had left breast  microcalcifications that were BIRADS-4.  She was unable to have these  biopsy stereotactically due to the fact that this cannot lay prone right  now.  She and I discussed a left breast wire localization biopsy for  these BIRADS-4 microcalcifications.   PROCEDURE:  After informed consent was obtained, the patient was first  taken to the [facility].  Dr. Norva Pavlov then  placed a wire in the lesion.  [name] was then brought to [facility] where she had copies of her mammograms with her upon arrival.  Following this, she was then taken to the operating room and placed  under general anesthesia without complication.  Prior to this,  sequential compression devices were placed on her lower extremities  without complications.  Left  breast was then prepped and draped in  standard sterile surgical fashion.  Surgical time-out was then  performed.   The wire was identified.  About 3-cm skin incision was made.  Dissection  was carried down and pulled the wire in to the wound.  Following this, I  then excised the wire and entire area surrounding the wire where the  calcifications were.  Faxitron mammogram was taken to the operating room  to confirm removal of the calcifications.  Dr. Manson Passey also confirmed the  removal of the calcifications.  Stitch was placed to the bed of this  where there was a small bleeding vessel of the pectoralis muscle.  Other  than that this area was hemostatic.  I then closed the deep layer with 2-  0 Vicryl.  The dermis closed with 4-0 Vicryl.  The skin was closed with  4-0 Monocryl in a subcuticular fashion.  Steri-Strips and sterile  dressing were placed.  She tolerated this well, was extubated in the  operating room, was transferred to recovery room in stable condition.      Juanetta Gosling,  MD  Electronically Signed     MCW/MEDQ  D:  05/19/2009  T:  05/20/2009  Job:  [id number]   cc:   Ernestina Penna, M.D.

## 2011-03-16 NOTE — H&P (Signed)
[name], [name]              ACCOUNT NO.:  [id number]   MEDICAL RECORD NO.:  [id number]          PATIENT TYPE:  INP   LOCATION:                               FACILITY:  [facility]   PHYSICIAN:  Juanetta Gosling, MDDATE OF BIRTH:  Nov 12, 1935   DATE OF ADMISSION:  12/10/2008  DATE OF DISCHARGE:                              HISTORY & PHYSICAL   HISTORY OF PRESENT ILLNESS:  [name] is a 75 year old female who  is well-know to me status post an open sigmoidectomy with a Hartmann's  pouch and end colostomy for multiple recurrent episodes of  diverticulitis that has been complicated by wound infection and 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  She has been having this dilated by me and  as well my partners recently.  She is doing well today, has had a wound  VAC applied to her midline wound which is doing very well but she does  complain of some erythema lateral to her colostomy as well as some  purulent drainage from the stoma.  No fever or other complaints.  Her  stoma is functioning.  The nausea she has previously had is also much  improved.   PAST MEDICAL HISTORY:  1. Type 2 diabetes.  2. GERD.  3. Osteoporosis.  4. Hypertension.   PAST SURGICAL HISTORY:  1. Right ovarian cystectomy.  2. Right total knee replacement.  3. Bilateral shoulder and wrist surgeries.  4. Open sigmoidectomy as well as a left oophorectomy for the recurrent      diverticulitis.   MEDICATIONS:  Zofran, Amaryl, Actos, Antara, Boniva, Os-Cal, Nexium,  Azor.   SOCIAL HISTORY:  She is a nonsmoker, does not drink.  Lives with her  husband [name].   ALLERGIES:  INTOLERANCE TO CRESTOR, LIPITOR, LESCOL AND MEVACOR.  SHE IS  ALLERGIC TO PENICILLIN, ASPIRIN AND MULTIPLE OPIATES.   PHYSICAL EXAMINATION:  GENERAL:  She is well-appearing today.  HEART:  Regular.  LUNGS:  Clear.  ABDOMEN:  Soft with midline wound with a VAC in place.  Ostomy is patent  and functional but there is erythema lateral  and purulence draining from  her stoma.   ASSESSMENT:  Left lower quadrant abdominal wall abscess.   PLAN:  Will admit to [facility], begin her on Invanz.  Make her NPO.  Will obtain a CT scan to ensure this is just an abdominal wall abscess.  I have discussed probable operative drainage of this later today and we  also discussed that at some point she may need her colostomy revised as  well but I would prefer not to do that right now.      Juanetta Gosling, MD  Electronically Signed     MCW/MEDQ  D:  12/10/2008  T:  12/10/2008  Job:  [id number]   cc:   Ernestina Penna, M.D.  Fax: [phone]

## 2011-03-16 NOTE — Op Note (Signed)
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   PROCEDURE:  Local colostomy revision.   SURGEON:  Juanetta Gosling, M.D.   ASSISTANT:  Angelia Mould. Derrell Lolling, M.D.   ANESTHESIA:  General.   FINDINGS:  Stomal stenosis at the skin level, otherwise healthy bowel.   SPECIMENS:  None.   DRAINS:  None.   ESTIMATED BLOOD LOSS:  Minimal.   COMPLICATIONS:  None.   SUPERVISING ANESTHESIOLOGIST:  Brayton Caves, M.D.   DISPOSITION TO RECOVERY ROOM:  Stable condition.   INDICATIONS:  Ms. Music is a 75 year old female who is well known to  me for a sigmoidectomy for diverticulitis who now has stomal stenosis.  I have attempted to dilate her, but she presented today with significant  lower abdominal pain consistent with an obstruction from her stomal  stenosis.  We discussed a local colostomy revision versus an exploratory  laparotomy colostomy revision.   PROCEDURE:  After informed consent was obtained, patient was taken to  the operating room.  She was administered 400 mg of ciprofloxacin due to  her allergies.  She had sequential compression devices placed on her  lower extremities prior to the operation.  She then underwent general  endotracheal anesthesia without complication.  Her abdomen was then  prepped and draped in the standard sterile surgical fashion.  A surgical  time-out was performed.   Her colostomy was then identified.  I was able to pass the Hegar  dilators, and it appeared that this was only involving the  skin level of  her stoma.  I then made an elliptical incision around the skin, removed  this, and then dissected down around her stoma.  I then removed the  portion of her stoma that was fibrotic.  The remainder of her  mucosa and her colon was very healthy in nature.  I then resutured this  to the new skin edge with a 3-0 Vicryl.  This stoma was widely patent  and was very healthy upon completion.  A bag was then placed over this.  She tolerated this well and was transferred to the recovery room in  stable condition.      Juanetta Gosling, MD  Electronically Signed     MCW/MEDQ  D:  04/01/2009  T:  04/01/2009  Job:  [id number]   cc:   Ernestina Penna, M.D.  Fax: [phone]

## 2011-03-16 NOTE — H&P (Signed)
NAMETHEO, REITHER              ACCOUNT NO.:  [id number]   MEDICAL RECORD NO.:  [id number]          PATIENT TYPE:  EMS   LOCATION:  ED                           FACILITY:  [facility]   PHYSICIAN:  Angelia Mould. Derrell Lolling, M.D.DATE OF BIRTH:  1935-11-11   DATE OF ADMISSION:  11/20/2008  DATE OF DISCHARGE:                              HISTORY & PHYSICAL   CHIEF COMPLAINT:  Chronic nausea, malaise, wound pain, cold sweats.   HISTORY OF PRESENT ILLNESS:  This is a 75 year old white female who has  had chronic nausea for several months.  She had to be hospitalized for  diverticulitis back in October.  She ultimately came for elective  resection of her sigmoid diverticulitis on October 11, 2008.  This was  a difficult case, and because of poor tissue integrity, Dr. Dwain Sarna  was forced to perform a Gertie Gowda and resection with colostomy.  She was  discharged but had to be readmitted from October 28, 2008, through  November 13, 2007, with a wound infection, urinary tract infection and  nausea.  She was discharged home.  She still has a PICC line in place.  She still takes Cipro for urinary tract infection.   She called me at approximately 2:00 a.m. this morning stating that she  did not feel well and that she wanted to come to the emergency room.  She stated that she continued to have nausea, although she does not  vomit every day.  She complained of malaise and cold sweats and just not  feeling right, dizziness and blurred vision.  She came to emergency room  this morning.  She has been stable and afebrile.  Lab work shows a  hemoglobin 10.8, white blood cell count 10,700 with a normal  differential.  Urinalysis shows moderate leukocyte esterase and is  otherwise negative.  Complete metabolic panel shows an albumin of 2.8  and glucose of 218.  CT scan of the abdomen and pelvis is pending.   PAST HISTORY:  1. Recurrent diverticulitis, status post Hartmann resection as      described above.  2.  Type 2 diabetes.  3. Gastroesophageal reflux disease.  4. Osteoporosis.  5. Hypertension.  6. Right salpingo-oophorectomy.  7. Bilateral wrist surgery.  8. Bilateral shoulder surgery.  9. Right total knee replacement.   CURRENT MEDICATIONS:  1. Cipro 500 mg p.o. b.i.d.  2. Zofran 4 mg tablets every 6 hours as needed for nausea.  3. Amaryl 20 mg daily.  4. Actos 30 mg p.o. daily.  5. Antara 130 mg p.o. daily.  6. Boniva one tablet 150 mg monthly.  7. Os-Cal with D daily.  8. Fish oil 2 tablets daily.  9. Nexium 20 mg b.i.d.  10.Vitamin D daily.  11.Singulair p.r.n.   DRUG ALLERGIES:  INTOLERANCE TO STATINS, PENICILLIN, ASPIRIN, SULFA AND  OPIATES.  ALSO, TODAY LISTS INTOLERANCE TO METFORMIN, MEVACOR, LIPITOR,  LESCOL AND CODEINE.   FAMILY HISTORY:  Noncontributory.   SOCIAL HISTORY:  She is married, lives with her husband.  Denies alcohol  or tobacco.  Visiting nurses are coming to the house every 2-3 days.  REVIEW OF SYSTEMS:  She has chronic nausea which has been going on  several months even before her surgery.  Otherwise, 10 system review of  systems is performed and is noncontributory except as described above.   PHYSICAL EXAMINATION:  GENERAL:  Alert, cooperative, nontoxic appearing,  but somewhat grumpy and anxious white female in minimal distress.  Her  husband is with her throughout the encounter.  VITAL SIGNS:  Temperature 97.3, pulse 90, respirations 18, blood  pressure 119/78.  HEENT:  Eyes:  Sclerae clear.  Extraocular movements intact.  Ears,  Mouth, Throat, Nose, Tongue and Oropharynx without gross lesions.  NECK:  Supple, nontender.  No mass.  No jugular distention.  LUNGS:  Clear to auscultation.  No chest wall tenderness.  HEART:  Regular rate and rhythm.  No ectopy, no murmur.  Radial and  femoral pulses are palpable.  ABDOMEN:  Slightly protuberant but soft.  Minimal diffuse tenderness.  No guarding or rebound.  Active bowel sounds.  Ostomy on the  left side  with formed stool in the bag.  Open wound in the lower midline with a  little bit of exudate and foul odor.  Exploration of the wound reveals  undermining on the right side and some significant subcutaneous fat  thickening extending over to the right at the drain site, but no  cellulitis.  This feels like fat necrosis or foreign body reaction.  Loosely repacked to the wound.  I do not feel a hernia anywhere.  EXTREMITIES:  She moves all four extremities well without pain or  deformity or edema.  NEUROLOGIC:  No gross motor sensory deficits.   ASSESSMENT:  1. Wound infection and fat necrosis.  2. Malaise, failure to thrive, dizziness, and cold sweats.  These      symptoms are not well explained at this time.  She does not appear      to be toxic.  3. Status post complex sigmoid colectomy with colostomy for      diverticulitis.  4. No clinical evidence of bowel obstruction or systemic infection.   PLAN:  1. The patient will be tentatively set up for admission.  2. I have discussed her care with Dr. Dwain Sarna who will follow up and      see her later this morning after her CAT scan.  3. Will do a CAT scan of the abdomen and pelvis to make sure that we      are not missing any undiagnosed abdominal wall problems such as      foreign body or fat necrosis and we are not missing any intra-      abdominal abscess.      Angelia Mould. Derrell Lolling, M.D.  Electronically Signed     HMI/MEDQ  D:  11/20/2008  T:  11/20/2008  Job:  [id number]   cc:   Juanetta Gosling, MD  [address]  [place] [address]

## 2011-03-16 NOTE — H&P (Signed)
NAMEROSELINDA, BAHENA              ACCOUNT NO.:  [id number]   MEDICAL RECORD NO.:  [id number]          PATIENT TYPE:  INP   LOCATION:  1601                         FACILITY:  [facility]   PHYSICIAN:  Marlowe Kays, M.D.  DATE OF BIRTH:  Jul 30, 1936   DATE OF ADMISSION:  07/11/2007  DATE OF DISCHARGE:                              HISTORY & PHYSICAL   ADDENDUM TO HISTORY AND PHYSICAL DICTATED ON June 30, 2007:   ALLERGIES:  This patient has many allergies.  Checking through various  documents, I have found that she is allergic to PENICILLIN, CODEINE,  ASPIRIN, SULFA, MORPHINE SULFATE, DARVOCET, ULTRAM, NOVOCAIN, ASPIRIN,  DEMEROL, VIOXX.  These are the ones that she could recall that were on  the paperwork in the chart, as well as the paperwork she filled out in  our office.  We, therefore, will have to be quite judicious in what kind  of medications we will use with this lady for her postoperative pain.  In all probability, we will just have to use Dilaudid in the IV and p.o.  form.      Dooley L. [name].    ______________________________  Marlowe Kays, M.D.    DLU/MEDQ  D:  07/12/2007  T:  07/12/2007  Job:  [id number]

## 2011-03-16 NOTE — Assessment & Plan Note (Signed)
OFFICE VISIT   [name], [name]  DOB:  08-20-1936                                       10/07/2009  EAVWU#:[id number]   The patient returns today for follow-up regarding a right upper arm AV  fistula created by Dr. Myra Gianotti on November 9 for end-stage renal  disease.  The fistula today was examined and is functioning nicely with  an excellent pulse and palpable thrill from the antecubital area up to  the shoulder.  The incision has healed nicely.  There is no evidence of  steal distally with palpable radial pulse and pink, well-perfused right  hand.  The patient and husband were instructed that the fistula should  not be accessed for dialysis until December 10, 2009, to give it adequate  time to heal.  She will return to see [name] on a p.r.n. basis.   Quita Skye Hart [place], M.D.  Electronically Signed   JDL/MEDQ  D:  10/07/2009  T:  10/08/2009  Job:  [id number]

## 2011-03-16 NOTE — Assessment & Plan Note (Signed)
[facility]                            CARDIOLOGY OFFICE NOTE   [name], [name]                     MRN:          [id number]  DATE:08/30/2008                            DOB:          [birth year]    REFERRING PHYSICIAN:  Juanetta Gosling, MD   PRIMARY CARE PHYSICIAN:  Ernestina Penna, MD   REASON FOR PRESENTATION:  Preoperative clearance and the patient with  multiple cardiovascular risk factors.   HISTORY OF PRESENT ILLNESS:  The patient is lovely 75 year old who I did  see back in 2003.  At that time, she had chest discomfort.  She had a  negative stress perfusion study.  She has not required any further  cardiovascular testing since then.  She has been bothered by  diverticulosis.  She has had 8 or 9 severe episodes over the last  several months.  She required hospitalization with antibiotic therapy.  The plan now is for hemicolectomy apparently.  She sent for preoperative  clearance.   She has been not down a little bit and not able to do quite as much  because of these illnesses.  However, she still does her activities of  daily living.  She climbs stairs.  She can push a vacuum cleaner  (greater than five METS).  With this, she denies any chest discomfort,  neck or arm discomfort.  She has no shortness of breath.  She has no  palpitation, presyncope, or syncope.  She denies any PND or orthopnea.  Of note, she did have knee surgery last year and did well with this.   PAST MEDICAL HISTORY:  Gastroesophageal reflux disease, hypertension x21  years, diabetes mellitus x7 years, osteoporosis.   PAST SURGICAL HISTORY:  Right total knee replacement in 2008,  diverticulitis, tonsillectomy, nasal reconstruction, ovarian cyst  resected, bilateral carpal tunnel surgery, left rotator cuff repair x2.   ALLERGIES AND INTOLERANCES:  PENICILLIN, CODEINE, DILAUDID, ASPIRIN,  SULFA.   MEDICATIONS:  1. Actos 30 mg daily.  2. Antara 130 mg  daily.  3. Azor 5/20 daily.  4. Boniva.  5. Multivitamin.  6. Calcium.  7. Nexium.  8. Magnesium.  9. Glimepiride 4 mg q.a.m. and 2 mg q.p.m.  10.Singulair.  11.Vitamin D.  12.Stool softener.  13.Fish oil.   SOCIAL HISTORY:  The patient is retired.  She is married.  She has one  child and one stepchild.  She does not smoke cigarettes.  She does not  drink alcohol.   FAMILY HISTORY:  Contributory for early coronary disease.  Her father  died with heart disease at age 66.  He had had bypass.   REVIEW OF SYSTEMS:  As stated in the HPI and otherwise positive for  glasses, dentures, joint pains.  Negative for all other systems.   PHYSICAL EXAMINATION:  GENERAL:  The patient is pleasant and in no  distress.  VITAL SIGNS:  Blood pressure 152/69, heart rate 72 and regular, weight  176 pounds, body mass index 33.  HEENT:  Eyes unremarkable.  Pupils equal, round, and reactive to light.  Fundi not visualized.  Oral mucosa  unremarkable.  NECK:  No jugular venous distention at 45 degrees.  Carotid upstroke  brisk and symmetrical.  No bruits, no thyromegaly.  LYMPHATICS:  No  cervical, axillary, or inguinal adenopathy.  LUNGS:  Clear to auscultation bilaterally.  BACK:  No costovertebral angle tenderness.  CHEST:  Unremarkable.  HEART:  PMI not displaced or sustained.  S1 and S2 within normal limits.  No S3, no S4.  No clicks, no rubs, no murmurs.  ABDOMEN:  Mildly obese,  positive bowel sounds normal in frequency and pitch.  No bruits, no  rebound, no guarding.  No midline pulsatile mass.  No hepatomegaly, no  splenomegaly.  SKIN:  No rashes, no nodules.  EXTREMITIES:  2+ pulses throughout.  No edema, no cyanosis, no clubbing.  NEUROLOGICAL:  Oriented to person, place, and time.  Cranial nerves II  through XII grossly intact.  Motor grossly intact.   EKG, sinus rhythm, rate 72, axis within normal limits, intervals within  normal limits, mild nonspecific inferior T-wave changes not   significantly changed from previous EKGs.   ASSESSMENT AND PLAN:  1. Preoperative clearance.  The patient has cardiovascular risk      factors.  However, none of these are untreated.  She has no      physical findings or other high risk symptoms.  This is a moderate      risk procedure from a cardiovascular standpoint.  She has a very      high functional level.  Given all of this and according to ACC/ AHA      guidelines, the patient is at acceptable risk for the planned      surgery without further cardiovascular testing.  2. Hypertension.  Blood pressure is slightly elevated today, but she      says this happens in the doctor's office.  She reports blood      pressure readings at home that are well controlled.  Her husband      follows this.  She remain on the medicines as listed.  3. Diabetes mellitus per Dr. Christell Constant.  4. Dyslipidemia.  The goal being LDL less than 100 and HDL greater      than 50.  5. Followup.  I would be happy to see her in the hospital if      necessary.  Otherwise, I will see her back in the office as needed.     Rollene Rotunda, MD, [facility]  Electronically Signed    JH/MedQ  DD: 08/30/2008  DT: 08/31/2008  Job #: [id number]   cc:   Juanetta Gosling, MD  Ernestina Penna, M.D.

## 2011-03-19 ENCOUNTER — Ambulatory Visit: Payer: Medicare Other | Admitting: *Deleted

## 2011-03-19 NOTE — Discharge Summary (Signed)
[name], [name]              ACCOUNT NO.:  [id number]   MEDICAL RECORD NO.:  [id number]          PATIENT TYPE:  INP   LOCATION:  1305                         FACILITY:  [facility]   PHYSICIAN:  Juanetta Gosling, MDDATE OF BIRTH:  [birth year]   DATE OF ADMISSION:  12/10/2008  DATE OF DISCHARGE:  12/13/2008                               DISCHARGE SUMMARY   ADMISSION DIAGNOSES:  1. Abdominal wall abscess.  2. Diabetes mellitus.  3. Esophageal reflux.  4. Osteoporosis.  5. Hypertension.   DISCHARGE DIAGNOSES:  1. Status post left lower quadrant abdominal wall abscess drainage.  2. Diabetes mellitus.  3. Esophageal reflux disease.  4. Osteoporosis.  5. Hypertension.   PROCEDURES PERFORMED:  On December 10, 2008, abdominal wall abscess,  incision and drainage with abdominal wound VAC change.   FOLLOW UP:  [facility] in 1 week.   DISCHARGE MEDICATIONS:  All of her previous medications at [facility]  as well as Percocet as needed for pain.   HOSPITAL COURSE:  [name] is a 75 year old female well known to me  status post open significantly with a Hartmann's pouch and end-colostomy  complicated by wound infection and stomal stenosis.  She presented after  having this area dilated with erythema and purulence drained lateral to  her stoma.  Underwent a CT scan which showed intra-abdominal lesion.  She and I talked about incision and drainage to this abscess.   She went to the operating room, and I incised and drained this abscess  with return of a fair amount of purulent material.  I left a Penrose  drain in place.  She actually was discharged on postoperative day #2,  doing well with home care setup.  I did send her home on Avelox for her  cellulitis that was surrounding that.  Will follow up in 1 week.      Juanetta Gosling, MD  Electronically Signed    MCW/MEDQ  D:  01/08/2009  T:  01/08/2009  Job:  [id number]

## 2011-03-19 NOTE — Op Note (Signed)
[facility]  Patient:    [name], [name] Visit Number: [id number] MRN: [id number]          Service Type: SUR Location: 4W 0445 02 Attending Physician:  Marlowe Kays Page Proc. Date: 08/11/01 Admit Date:  08/11/2001                             Operative Report  PREOPERATIVE DIAGNOSES: 1. Torn rotator cuff. 2. Extensive degenerative arthritis acromioclavicular joint, right shoulder.  POSTOPERATIVE DIAGNOSES: 1. Torn rotator cuff. 2. Extensive degenerative arthritis acromioclavicular joint, right shoulder.  OPERATION: 1. Open resection distal right clavicle. 2. Anterior acromionectomy, repair of torn rotator cuff right shoulder.  SURGEON:  Illene Labrador. [place], M.D.  ASSISTANT:  Irena Cords, P.A.-C.  ANESTHESIA:  General.  PATHOLOGY AND JUSTIFICATION FOR PROCEDURE:  Severe pain in the right shoulder associated with significant tendinous at the [facility] joint.  Plain x-rays show large spurs off the inferior aspect of the [facility] joint, and MRI has demonstrated a large full-thickness tear.  In surgery, this tear measured 1 cm in width with an upside-down V-type configuration with the apex of the V about 2 cm from its attachment.  There was some retraction of the posterior rotator cuff fibers.  They were flexible, however.  DESCRIPTION OF PROCEDURE:  Prophylactic antibiotics, intrascalene block by anesthesiologist, general anesthesia, beach chair position on the Schlein frame.  The right shoulder girdle was prepped with DuraPrep and draped in a sterile field.  Ioban employed.  Vertical incision was made from roughly the greater tuberosity up to the [facility] joint, curving slightly over the distal clavicle.  The fascia on the distal clavicle, extending down over the acromion, was opened with a cutting cautery, and the deltoid was split for about 1 cm.  The distal clavicle and AC joint were exposed.  I marked out a little less than 2 cm from the end of the  joint then undermined the clavicle at this point and protecting the underneath structures, used a microsaw to remove the distal clavicle.  Bone wax was applied to the cut end of the remaining clavicle.  I then undermined the anterior acromion with a small Cobb elevator followed by a larger Cobb elevator and protecting the underneath rotator cuff structures, used a microsaw to perform by initial anterior acromionectomy followed by additional bone removal, beveling it inferiorly so that the impingement problem was completely corrected.  The tear, as described above, was identified.  I then made a bony trough at the greater tuberosity with a half-inch curved osteotome and place three drill holes through the greater tuberosity and using these three drill holes, placed three  horizontal mattress sutures through the rotator cuff, taking care to bring the retracted portion of the posterior part to its normal anatomic position, incorporating it as part of the repair.  The arm abducted, all sutures were tied, giving a nice stable repair.  I then supplemented this with multiple #1 Vicryl sutures through the lateral portion of the rotator cuff to the lateral soft tissue on the humerus.  I checked, and there was no residual impingement.  The wound was irrigated well with sterile saline and soft tissue was infiltrated with 0.5% Marcaine with adrenalin.  I then placed Gelfoam in the gap from the removal of the distal clavicle and closed the wound with multiple interrupted #1 Vicryl in the fascia over the distal clavicle, distal clavicle resection site, and the anterior acromion resection  site, and in the deltoid muscle.  Subcutaneous tissue was closed with a combination of 2-0 Vicryl deep, 4-0 Vicryl subcuticularly, and Steri-Strips were placed on the skin.  Dry sterile dressings, shoulder immobilizer applied.  She tolerated the procedure well and at the time of this dictation was on her way to the  recovery room in satisfactory condition with no known complications. Attending Physician:  Joaquin Courts DD:  08/11/01 TD:  08/11/01 Job: [id number]

## 2011-03-19 NOTE — Discharge Summary (Signed)
[facility]  Patient:    [name], [name]             MRN: [id number] Adm. Date:  [id number] Disc. Date: 09/02/00 Attending:  Marlowe Kays Page Dictator:   Druscilla Brownie. Shela Nevin, P.A. CC:         Monica Becton, M.D., [facility] [place], [place] [address]                           Discharge Summary  ADMISSION DIAGNOSES: 1. Tear of the rotator cuff of the left shoulder. 2. Hypertension. 3. Type 2 diabetes. 4. Gastroesophageal reflux disease. 5. Chronic bronchitis. 6. Multiple medical allergies.  DISCHARGE DIAGNOSES: 1. Tear of the rotator cuff of the left shoulder. 2. Hypertension. 3. Type 2 diabetes. 4. Gastroesophageal reflux disease. 5. Chronic bronchitis. 6. Multiple medical allergies.  OPERATION:  On August 31, 2000, the patient underwent anterior acromionectomy and repair of the torn rotator cuff with open resection of the distal clavicle of the left shoulder.  Jenne Campus assisted.  BRIEF HISTORY:  This 75 year old lady has had chronic and progressive problems concerning her left shoulder following a fall on April 23, 2000, while on vacation in [place].  She has had physical therapy, anti-inflammatories, but unfortunately has not had any progress with range of motion or reduction of the chronic discomfort of her left shoulder.  Arthrogram showed complete tear of the rotator cuff.  Due to her persistent pain and lack of response to conservative care, it was thought she would benefit from surgical intervention and was admitted for the above procedure.  She was cleared preoperatively by Dr. Vernon Prey at [facility] in [place].  COURSE IN HOSPITAL:  The patient tolerated the surgical procedure quite well but had a considerable amount of nausea and vomiting postoperatively.  IV medications were used to control this.  She also had some difficulty with her respiratory  clearance.  She had cough.  Hand-held nebulizer was used as well as incentive spirometer.  She responded very nicely to theses treatment programs and on the day of discharge had little to no nausea.  She was breathing quite well without any difficulty.  She was to seen by occupational therapy for instructions on ADLs only prior to discharge.  No exercise of the left shoulder will be done.  The wound was dry, neurovascularly intact left upper extremity.  It was felt she could be maintained in her home environment, and arrangements were made for discharge.  LABORATORY VALUES IN HOSPITAL:  Hematologically, she had a CBC upon admission which was completely within normal limits.  Blood chemistries were also normal as well.  Urinalysis was essentially negative for urinary tract infection.  The chest x-ray from Dr. Buel Ream office showed "no acute abnormalities."  Electrocardiogram also from his office showed essentially normal EKG.  CONDITION UPON DISCHARGE:  Improved and stable.  PLAN:  The patient is discharged to her home in the care of her family.  FOLLOWUP:  She is to follow up medically with Dr. Vernon Prey.  DISCHARGE MEDICATIONS: 1. Celebrex 200 mg 1 b.i.d. for next 3 days, then 1 q.d., #40 given. 2. Phenergan 25 mg tablets, #20 with a refill, 1 q.6h. p.r.n. nausea. 3. Robaxin 500 mg, #30 with a refill, 1 q.6h. p.r.n. muscle spasms. 4. Talwin NX, #50, 1 q.4h. p.r.n. pain.  WOUND CARE:  She is to keep the Steri-Strips on the left  shoulder dry, use dry dressing p.r.n.  SPECIAL INSTRUCTIONS:  Return to the center two weeks after date of surgery and continue with home medications and diet under the direction of Dr. Vernon Prey.  Follow with him per his instructions. DD:  09/02/00 TD:  09/02/00 Job: [id number]

## 2011-03-19 NOTE — Discharge Summary (Signed)
[name], [name]              ACCOUNT NO.:  [id number]   MEDICAL RECORD NO.:  [id number]          PATIENT TYPE:  INP   LOCATION:  1601                         FACILITY:  [facility]   PHYSICIAN:  [name], M.D.  DATE OF BIRTH:  Aug 26, 1936   DATE OF ADMISSION:  07/11/2007  DATE OF DISCHARGE:  07/17/2007                               DISCHARGE SUMMARY   ADMITTING DIAGNOSES:  1. Osteoarthritis of right knee.  2. Type 2 diabetes.  3. Dyslipidemia.  4. Anemia.  5. Reflux.  6. Hypertension  7. Multiple medical allergies.   DISCHARGE DIAGNOSES:  1. Osteoarthritis of right knee.  2. Type 2 diabetes.  3. Dyslipidemia.  4. Anemia.  5. Reflux.  6. Hypertension  7. Multiple medical allergies.  8. Mild postoperative anemia treated with transfusion.   OPERATION:  On July 11, 2007, the patient underwent [employer] total  knee replacement arthroplasty of the right knee; [name],  P.A. assisted.   BRIEF HISTORY:  This 75 year old lady with problems concerning her right  knee saw [name] for some time concerning that.  Years ago she had  reconstruction surgery secondary to damage by [name].  Unfortunately over the years, the knee has essentially, become unstable  at this point in time with pain and discomfort on range of motion.  X-  rays showed deterioration of the joint, particular the medial joint with  bone-on-bone deformity.  After much discussion including the risks and  benefits of surgery, we decided to go ahead with the above procedure.   COURSE IN THE HOSPITAL:  The patient tolerated the surgical procedure  quite well.  She had a consider amount of pain and discomfort  postoperatively, and we used analgesics, that she was not allergic to  for this situation.  She had nausea, postoperatively, from the Dilaudid  used for PCA (this was the only one that we really could use due to her  allergies).  Drain was pulled on the first postop day.  Dressing was  changed on the second postop day, and the wound was clean and dry and  staples were intact.  She had a slight rise in temperature to 101.5;  however, she was stable, using incentive spirometer, and had no other  febrile incidents.   Her hemoglobin did drop to 8.1; and she was somewhat woozy when she was  getting up and about, so we decided to go ahead with transfusion.  Preoperatively she was 10.9 and this had dropped over 2 grams to 8.1.  After her transfusion, her hemoglobin came up to 11.1;  she felt much  better, and was discharged home by [name] in stable condition.   The patient was placed on Lovenox postoperatively and then Coumadin for  home use.   LABORATORY VALUES:  In the hospital hematologically showed admitting CBC  with some anemia, and hemoglobin was 10.9 and hematocrit was 31.9.  Final hemoglobin was 11.1 with hematocrit of 32.1.  Blood chemistries  were essentially normal.  She did have a minimal drop in sodium at 133.  Glucose varied from 171 to 227 to 138.  Urinalysis was negative for  urinary tract infection.  The EGFR was calculated and showed a possible  chronic kidney disease.  The EGFR was 48.  Electrocardiogram showed  normal ECG, and no chest x-ray seen on this chart.   CONDITION ON DISCHARGE:  Improved/stable.   PLAN:  The patient is discharged home on Coumadin and can use Tylenol  for discomfort.   OTHER MEDICATIONS:  Under the direction of [name] are  1. Boniva 150 mg monthly.  2. Azor 5/20 daily.  3. Enterra 130 mg nightly.  4. Sanctura XR 50 mg every other day at noon.  5. Nexium 40 mg t.i.d.  6. Fexofenadine hydrochloride 100 mg daily.  7. Glimepiride 4 mg daily in the morning.  8. Actos 30 mg one-half tablet a.m. and p.m.  9. Lovaza 2 mg b.i.d.  10.Docusate sodium 100 mg p.r.n.  11.Calcium.  12.Multivitamin.   She is return to the office 2 weeks after surgery, may weight bear as  tolerated.  Continue with her diet that she  enjoyed preoperatively,  again, under the direction of [name].  Return to the office 2 weeks  after the date of surgery.  Home Health with [name] and may shower 4  days after date of surgery.      [name].    ______________________________  [name], M.D.    DLU/MEDQ  D:  07/27/2007  T:  07/28/2007  Job:  [id number]   cc:   [name], M.D.  Fax: [phone]

## 2011-03-19 NOTE — H&P (Signed)
[facility]  Patient:    [name], [name]             MRN: [id number] Adm. Date:  08/31/00 Attending:  Fayrene Fearing P. Aplington, M.D. Dictator:   Druscilla Brownie. Shela Nevin, P.A. CC:         Rudi Heap, M.D., [facility], [phone], [place], [place] [address]                         History and Physical  DATE OF BIRTH:  [birth year]  CHIEF COMPLAINT:  "Pain in my left shoulder."  HISTORY OF PRESENT ILLNESS:  This 75 year old white female seen by Illene Labrador. Aplington, M.D. for continuing and interfering problems concerning her left shoulder. She was on a vacation in [place] on April 23, 2000. She tripped over an irregular sidewalk and fell forward. She extended her left upper extremity to protect herself and had immediate pain in the left shoulder. She also had contusions about the left side of her body, including her face with a small laceration of her eye. She was seen by [name] later on the year in late September of 2001 where she continued with pain into the left shoulder and difficulty in raising the arm. Examination reveals tenderness to palpation in the [facility] joint with restricted range of motion even passively with 3+/5 strength in abduction. X-rays showed the [facility] joint to be hypertrophic, as well. Arthrogram performed on August 01, 2000, demonstrated a large rotator cuff tear. Also, spur formation was seen underlying the left AC joint, as well. Due to the positive findings and the fact that this patient is a very active lady, it is felt that she would benefit with surgical intervention and being admitted for an anterior acromionectomy repair of the torn rotator cuff and possible distal clavicle resection of the left shoulder.  The patient has been cleared preoperatively by Monica Becton, M.D. of [facility] for this surgical procedure.  PAST MEDICAL HISTORY:  Medically, the patient has  hypertension, diabetes type 2, gastroesophageal reflux, chronic bronchitis. She also has a history of scarlet fever, mumps, measles, upper respiratory infection which required hospitalization.  PAST SURGICAL HISTORY:  She has had a tonsillectomy in 1956, nose reconstruction secondary to trauma in April of 1964, ovarian cyst on the right in 1966 or 1967, right knee surgery by ______ in 1981, and right knee surgery again with what sounds like an arthrotomy in 1994, 1995 by Illene Labrador. Aplington, M.D.  CURRENT MEDICATIONS: 1. Hyzaar 100/25 one q.d. 2. Accupril 20 mg one q.d. 3. Prilosec 20 mg one q.d. 4. Celebrex 200 mg one q.d. or b.i.d. p.r.n. 5. Vitamin E. 6. Prempro 0.625 mg daily.  ALLERGIES:  She is allergic to ASPIRIN and VIOXX which causes esophageal problems. PENICILLIN causes a rash. SULFA causes rash and itching. DEMEROL, CODEINE, DARVOCET, NOVOCAINE, ULTRAM all cause hallucinations and rapid heart beat.  SOCIAL HISTORY:  The patient is married and neither smokes nor drinks.  FAMILY HISTORY:  Positive for hypertension in the mother and father. Heart disease in the father. Stroke in the father and mother. Diabetes in the mother and sister.  REVIEW OF SYSTEMS:  CNS:  No seizures, stroke, paralysis, numbness, or double vision. CARDIOVASCULAR:  No chest pain. No angina. No orthopnea. RESPIRATORY: The patient has chronic bronchitis with a dry nonproductive cough which she relates to to inhalant allergies. No hemoptysis. No shortness of breath. GASTROINTESTINAL:  The patient has "polyps" in her stomach and does well with her Prilosec. GENITOURINARY:  No discharge, dysuria, or hematuria. MUSCULOSKELETAL: Primarily in present illness.  PHYSICAL EXAMINATION:  GENERAL:  Alert, cooperative, friendly, diminutive, fully oriented, 75 year old white female who is accompanied by her husband.  VITAL SIGNS:  Blood pressure 150/66, pulse 80, respirations are 12.  HEENT:  Normocephalic.  PERRLA. EOM intact. Oropharynx is clear.  CHEST:  Clear to auscultation. No rhonchi. No rales.  HEART:  Regular rate and rhythm. No murmurs are heard.  ABDOMEN:  Soft, nontender. Liver, spleen not felt.  GENITALIA/RECTAL/PELVIC/BREASTS:  Not done. Not pertinent to present illness.  EXTREMITIES:  Left shoulder as in present illness above. Neurovascular is intact left upper extremity.  ADMISSION DIAGNOSES: 1. Rotator cuff tear of the left shoulder. 2. Hypertension. 3. Type 2 diabetes. 4. Gastroesophageal reflux disease. 5. Chronic bronchitis.  PLAN:  The patient will undergo anterior acromionectomy and repair of torn rotator cuff and possible distal clavicle resection of the left shoulder. James P. Aplington, M.D. has explained the complications of surgeries to the patient. She has been cleared preoperatively by Monica Becton, M.D. of [facility]. DD:  08/25/00 TD:  08/25/00 Job: [id number]

## 2011-03-23 ENCOUNTER — Ambulatory Visit: Payer: Medicare Other | Admitting: Physical Therapy

## 2011-03-25 ENCOUNTER — Encounter: Payer: Medicare Other | Admitting: Physical Therapy

## 2011-03-30 ENCOUNTER — Encounter: Payer: Medicare Other | Admitting: Physical Therapy

## 2011-03-31 ENCOUNTER — Ambulatory Visit: Payer: Medicare Other | Admitting: Physical Therapy

## 2011-04-02 ENCOUNTER — Ambulatory Visit: Payer: Medicare Other | Attending: Family Medicine | Admitting: Physical Therapy

## 2011-04-02 DIAGNOSIS — M6281 Muscle weakness (generalized): Secondary | ICD-10-CM | POA: Insufficient documentation

## 2011-04-02 DIAGNOSIS — R5381 Other malaise: Secondary | ICD-10-CM | POA: Insufficient documentation

## 2011-04-02 DIAGNOSIS — IMO0001 Reserved for inherently not codable concepts without codable children: Secondary | ICD-10-CM | POA: Insufficient documentation

## 2011-04-02 DIAGNOSIS — R293 Abnormal posture: Secondary | ICD-10-CM | POA: Insufficient documentation

## 2011-04-02 DIAGNOSIS — M545 Low back pain, unspecified: Secondary | ICD-10-CM | POA: Insufficient documentation

## 2011-04-05 ENCOUNTER — Ambulatory Visit: Payer: Medicare Other | Admitting: Physical Therapy

## 2011-04-09 ENCOUNTER — Ambulatory Visit: Payer: Medicare Other | Admitting: Physical Therapy

## 2011-04-12 ENCOUNTER — Ambulatory Visit: Payer: Medicare Other

## 2011-04-14 ENCOUNTER — Ambulatory Visit: Payer: Medicare Other | Admitting: Physical Therapy

## 2011-04-19 ENCOUNTER — Ambulatory Visit: Payer: Medicare Other | Admitting: Physical Therapy

## 2011-04-22 ENCOUNTER — Ambulatory Visit: Payer: Medicare Other | Admitting: Physical Therapy

## 2011-04-26 ENCOUNTER — Ambulatory Visit: Payer: Medicare Other | Admitting: Physical Therapy

## 2011-04-28 ENCOUNTER — Ambulatory Visit: Payer: Medicare Other | Admitting: Physical Therapy

## 2011-05-03 ENCOUNTER — Ambulatory Visit: Payer: Medicare Other | Attending: Family Medicine | Admitting: Physical Therapy

## 2011-05-03 DIAGNOSIS — R293 Abnormal posture: Secondary | ICD-10-CM | POA: Insufficient documentation

## 2011-05-03 DIAGNOSIS — R5381 Other malaise: Secondary | ICD-10-CM | POA: Insufficient documentation

## 2011-05-03 DIAGNOSIS — M545 Low back pain, unspecified: Secondary | ICD-10-CM | POA: Insufficient documentation

## 2011-05-03 DIAGNOSIS — IMO0001 Reserved for inherently not codable concepts without codable children: Secondary | ICD-10-CM | POA: Insufficient documentation

## 2011-05-03 DIAGNOSIS — M6281 Muscle weakness (generalized): Secondary | ICD-10-CM | POA: Insufficient documentation

## 2011-05-06 ENCOUNTER — Ambulatory Visit: Payer: Medicare Other | Admitting: Physical Therapy

## 2011-05-10 ENCOUNTER — Ambulatory Visit: Payer: Medicare Other | Admitting: Physical Therapy

## 2011-05-14 ENCOUNTER — Ambulatory Visit: Payer: Medicare Other | Admitting: Physical Therapy

## 2011-05-17 ENCOUNTER — Ambulatory Visit: Payer: Medicare Other | Admitting: Physical Therapy

## 2011-05-18 ENCOUNTER — Ambulatory Visit: Payer: Medicare Other | Admitting: Physical Therapy

## 2011-05-20 ENCOUNTER — Other Ambulatory Visit: Payer: Self-pay | Admitting: Family Medicine

## 2011-05-20 DIAGNOSIS — Z1231 Encounter for screening mammogram for malignant neoplasm of breast: Secondary | ICD-10-CM

## 2011-05-27 ENCOUNTER — Emergency Department (HOSPITAL_COMMUNITY): Payer: Medicare Other

## 2011-05-27 ENCOUNTER — Encounter (HOSPITAL_COMMUNITY): Payer: Self-pay

## 2011-05-27 ENCOUNTER — Inpatient Hospital Stay (HOSPITAL_COMMUNITY)
Admission: EM | Admit: 2011-05-27 | Discharge: 2011-05-29 | DRG: 690 | Disposition: A | Discharge status: Home / Self Care | Payer: Medicare Other | Attending: Internal Medicine | Admitting: Internal Medicine

## 2011-05-27 DIAGNOSIS — I129 Hypertensive chronic kidney disease with stage 1 through stage 4 chronic kidney disease, or unspecified chronic kidney disease: Secondary | ICD-10-CM | POA: Diagnosis present

## 2011-05-27 DIAGNOSIS — IMO0001 Reserved for inherently not codable concepts without codable children: Secondary | ICD-10-CM | POA: Diagnosis present

## 2011-05-27 DIAGNOSIS — Z9049 Acquired absence of other specified parts of digestive tract: Secondary | ICD-10-CM

## 2011-05-27 DIAGNOSIS — N183 Chronic kidney disease, stage 3 unspecified: Secondary | ICD-10-CM | POA: Diagnosis present

## 2011-05-27 DIAGNOSIS — A498 Other bacterial infections of unspecified site: Secondary | ICD-10-CM | POA: Diagnosis present

## 2011-05-27 DIAGNOSIS — K560 Paralytic ileus: Secondary | ICD-10-CM | POA: Diagnosis present

## 2011-05-27 DIAGNOSIS — Z7982 Long term (current) use of aspirin: Secondary | ICD-10-CM

## 2011-05-27 DIAGNOSIS — E039 Hypothyroidism, unspecified: Secondary | ICD-10-CM | POA: Diagnosis present

## 2011-05-27 DIAGNOSIS — Z933 Colostomy status: Secondary | ICD-10-CM

## 2011-05-27 DIAGNOSIS — Z96659 Presence of unspecified artificial knee joint: Secondary | ICD-10-CM

## 2011-05-27 DIAGNOSIS — E860 Dehydration: Secondary | ICD-10-CM | POA: Diagnosis present

## 2011-05-27 DIAGNOSIS — N390 Urinary tract infection, site not specified: Principal | ICD-10-CM | POA: Diagnosis present

## 2011-05-27 DIAGNOSIS — Z888 Allergy status to other drugs, medicaments and biological substances status: Secondary | ICD-10-CM

## 2011-05-27 DIAGNOSIS — K5730 Diverticulosis of large intestine without perforation or abscess without bleeding: Secondary | ICD-10-CM | POA: Diagnosis present

## 2011-05-27 DIAGNOSIS — IMO0002 Reserved for concepts with insufficient information to code with codable children: Secondary | ICD-10-CM | POA: Diagnosis present

## 2011-05-27 DIAGNOSIS — Z79899 Other long term (current) drug therapy: Secondary | ICD-10-CM

## 2011-05-27 DIAGNOSIS — Z880 Allergy status to penicillin: Secondary | ICD-10-CM

## 2011-05-27 DIAGNOSIS — E785 Hyperlipidemia, unspecified: Secondary | ICD-10-CM | POA: Diagnosis present

## 2011-05-27 DIAGNOSIS — Z794 Long term (current) use of insulin: Secondary | ICD-10-CM

## 2011-05-27 HISTORY — DX: Diverticulitis of intestine, part unspecified, without perforation or abscess without bleeding: K57.92

## 2011-05-27 LAB — DIFFERENTIAL
Basophils Relative: 1 % (ref 0–1)
Eosinophils Absolute: 0.2 10*3/uL (ref 0.0–0.7)
Eosinophils Relative: 2 % (ref 0–5)
Neutrophils Relative %: 65 % (ref 43–77)

## 2011-05-27 LAB — URINALYSIS, ROUTINE W REFLEX MICROSCOPIC
Bilirubin Urine: NEGATIVE
Glucose, UA: NEGATIVE mg/dL
Specific Gravity, Urine: 1.025 (ref 1.005–1.030)
pH: 5.5 (ref 5.0–8.0)

## 2011-05-27 LAB — COMPREHENSIVE METABOLIC PANEL
ALT: 39 U/L — ABNORMAL HIGH (ref 0–35)
AST: 48 U/L — ABNORMAL HIGH (ref 0–37)
Albumin: 2.9 g/dL — ABNORMAL LOW (ref 3.5–5.2)
CO2: 21 mEq/L (ref 19–32)
Calcium: 9.7 mg/dL (ref 8.4–10.5)
Chloride: 103 mEq/L (ref 96–112)
Creatinine, Ser: 1.42 mg/dL — ABNORMAL HIGH (ref 0.50–1.10)
GFR calc non Af Amer: 36 mL/min — ABNORMAL LOW (ref >60)
Sodium: 136 mEq/L (ref 135–145)

## 2011-05-27 LAB — URINE MICROSCOPIC-ADD ON

## 2011-05-27 LAB — CBC
MCH: 30.8 pg (ref 26.0–34.0)
MCV: 92.6 fL (ref 78.0–100.0)
Platelets: 236 10*3/uL (ref 150–400)
RBC: 3.93 MIL/uL (ref 3.87–5.11)
RDW: 13.8 % (ref 11.5–15.5)
WBC: 10.1 10*3/uL (ref 4.0–10.5)

## 2011-05-27 LAB — GLUCOSE, CAPILLARY

## 2011-05-27 MED ORDER — IOHEXOL 300 MG/ML  SOLN
100.0000 mL | Freq: Once | INTRAMUSCULAR | Status: AC | PRN
  Administered 2011-05-27: 100 mL via INTRAVENOUS

## 2011-05-28 LAB — GLUCOSE, CAPILLARY
Glucose-Capillary: 183 mg/dL — ABNORMAL HIGH (ref 70–99)
Glucose-Capillary: 153 mg/dL — ABNORMAL HIGH (ref 70–99)
Glucose-Capillary: 187 mg/dL — ABNORMAL HIGH (ref 70–99)
Glucose-Capillary: 230 mg/dL — ABNORMAL HIGH (ref 70–99)
Glucose-Capillary: 250 mg/dL — ABNORMAL HIGH (ref 70–99)

## 2011-05-28 LAB — MRSA PCR SCREENING: MRSA by PCR: POSITIVE — AB

## 2011-05-29 LAB — GLUCOSE, CAPILLARY
Glucose-Capillary: 192 mg/dL — ABNORMAL HIGH (ref 70–99)
Glucose-Capillary: 226 mg/dL — ABNORMAL HIGH (ref 70–99)
Glucose-Capillary: 215 mg/dL — ABNORMAL HIGH (ref 70–99)

## 2011-05-29 LAB — URINE CULTURE

## 2011-06-02 NOTE — Discharge Summary (Signed)
NAMEMarland Kitchen  [name], [name] NO.:  [id number]  MEDICAL RECORD NO.:  [id number]  LOCATION:  1302                         FACILITY:  [facility]  PHYSICIAN:  Altha Harm, MDDATE OF BIRTH:  1936/04/08  DATE OF ADMISSION:  05/27/2011 DATE OF DISCHARGE:  05/29/2011                              DISCHARGE SUMMARY   PRIMARY CARE PHYSICIAN:  Ernestina Penna, M.D. at [facility] [place] [facility].  DISCHARGE DISPOSITION:  Home.  FINAL DISCHARGE DIAGNOSES: 1. Urinary tract infection - Escherichia coli. 2. Uncontrolled diabetes type 2. 3. Hyperlipidemia. 4. Mild dehydration, resolved. 5. Hypothyroidism. 6. Chronic renal insufficiency, stage 3. 7. Hypertension, controlled. 8. Mild ileus, resolved.  DISCHARGE MEDICATIONS: 1. Ciprofloxacin 250 mg p.o. b.i.d. for 5 days. 2. Norvasc 10 mg p.o. daily. 3. Aspirin enteric-coated 325 mg p.o. daily. 4. Doxazosin 1 mg p.o. q.h.s. 5. Humalog 50/50 units subcu in the morning and 25 units subcu in the     evening. 6. Synthroid 25 mcg p.o. daily. 7. Lovaza 1 g 2 capsules by mouth b.i.d. 8. Lumigan eye drops 1 drop in both eyes twice daily. 9. Metoprolol 25 mg p.o. b.i.d. 10.Multivitamin 1 tablet p.o. daily. 11.NovoLog sliding scale 1-9 units subcutaneously t.i.d. a.c. meals. 12.Omeprazole 20 mg p.o. b.i.d. 13.Rena-Vite 1 tablet p.o. daily. 14.Tylenol Extra Strength 1000 mg p.o. q.4 h. p.r.n. pain.  CONSULTANTS:  None.  PROCEDURES:  None.  DIAGNOSTIC STUDIES: 1. Acute abdominal x-ray series, which shows no active cardiopulmonary     disease.  Mild ileus versus early partial obstruction. 2. CT abdomen and pelvis without contrast, which shows:     a.     Persistent large parastomal hernia containing small bowel      loops without obstruction.     b.     Progression of T12 compression fracture deformity.     c.     Fatty liver.     d.     Coronary and aortic calcifications.  ALLERGIES: 1. PENICILLIN. 2. SULFA. 3.  DEMEROL. 4. CODEINE. 5. DARVOCET. 6. ULTRAM. 7. NOVOCAINE 8. NSAID. 9. HYDROMORPHONE. 10.STATINS.  CODE STATUS:  Full code.  CHIEF COMPLAINT:  Intractable nausea, vomiting.  HISTORY OF PRESENT ILLNESS:  Please refer to the H and P by Dr. Nedra Hai for details of the HPI; however, in short this patient too has a history of prior diverticulitis and diverting ileocolostomy, who was seen by her back surgeon.  The patient was having some nausea and vomiting and they sent her over to be further evaluated in the emergency room.  HOSPITAL COURSE: 1. Mild ileus:  The patient was having nausea and vomiting and her     abdominal x-ray revealed mild ileus.  There were some questions     whether or not there was obstruction; however, CT abdomen and     pelvis did not show any obstruction.  It is felt that the ileus is     coming from urinary tract infection.  As a matter of fact, once the     patient receives some hydration and treatment, then the ileus     resolved and she was able to tolerate her diet without any  difficulty. 2. E coli UTI:  The patient's urinalysis suggested urinary tract     infection.  The urine culture grew out E coli, sensitive to     ceftriaxone and ciprofloxacin.  The patient has been started on     ceftriaxone on admission and she received two days of therapy and     she will be discharged on 5 days of ciprofloxacin to complete a     total of 7 days of therapy. 3. Diabetes type 2, uncontrolled:  The patient's hemoglobin A1c was     7.6 pointing to uncontrolled diabetes.  The patient is put back on     the usual medications without any titration as the patient had been     on sliding scale due to her ileus.  I will defer to her primary     care physician to further titrate her medications as an outpatient. 4. Hypothyroidism:  TSH was within normal limits pointing to well     supplemented thyroid hormone.  She is continued on her usual dose. 5. Hypertension:   Adequately control on usual medications and     continue. 6. Hyperlipidemia, noted:  Continue on her usual medications.  PHYSICAL EXAMINATION:  VITAL SIGNS:  At the time of discharge the patient is stable.  Her temperature is 98.4, heart rate 70, blood pressure 130/68, respiratory rate 16, O2 sats are 95% on room air.  CBGs are ranging from 187 to 250. GENERAL:  The patient is well appearing. HEENT EXAMINATION:  She is normocephalic, atraumatic.  Pupils are equally round and reactive to light and accommodation.  Extraocular movements are intact.  Oropharynx is moist.  No exudate, erythema or lesions are noted. NECK EXAMINATION:  Trachea is midline.  No masses.  No thyromegaly.  No JVD.  No carotid bruits. RESPIRATORY EXAMINATION:  The patient has a normal respiratory effort, equal excursion bilaterally.  No wheezing or rhonchi noted. CARDIOVASCULAR:  Normal S1 and S2.  No murmurs, rubs, or gallops are noted.  PMI is nondisplaced.  No heaves or thrills on palpation. ABDOMEN:  Obese, soft, nontender, nondistended.  No masses.  No hepatosplenomegaly.  The stoma from ileocolic colostomy appears to be within normal limits and illuminating well. PSYCHIATRIC:  She is alert and oriented x3.  Good insight and cognition. Good recent and remote recall. NEUROLOGIC:  The patient has no focal neurological deficits.  Cranial nerves II through XII are grossly intact.  DIETARY RESTRICTIONS:  The patient should be on diabetic heart-healthy diet.  PHYSICAL RESTRICTIONS:  Activity as tolerated.  FOLLOWUP:  The patient is to follow up with her primary care physician, Dr. Rudi Heap at [facility] within 1 week.  Total time for this discharge process including face-to-face time approximately 32 minutes.     Altha Harm, MD     MAM/MEDQ  D:  05/29/2011  T:  05/29/2011  Job:  [id number]  cc:   Ernestina Penna, M.D. Fax: [phone]  Electronically Signed by  Marthann Schiller MD on 06/02/2011 03:44:52 PM

## 2011-06-03 LAB — CULTURE, BLOOD (ROUTINE X 2)
Culture  Setup Time: 201207270843
Culture  Setup Time: 201207270843
Culture: NO GROWTH

## 2011-06-17 ENCOUNTER — Ambulatory Visit
Admission: RE | Admit: 2011-06-17 | Discharge: 2011-06-17 | Disposition: A | Discharge status: Home / Self Care | Payer: Medicare Other | Source: Ambulatory Visit | Attending: Family Medicine | Admitting: Family Medicine

## 2011-06-17 DIAGNOSIS — Z1231 Encounter for screening mammogram for malignant neoplasm of breast: Secondary | ICD-10-CM

## 2011-06-21 NOTE — H&P (Signed)
NAMEMarland Kitchen  DALEY, MOORADIAN NO.:  [id number]  MEDICAL RECORD NO.:  [id number]  LOCATION:  WLED                         FACILITY:  [facility]  PHYSICIAN:  Houston Siren, MD           DATE OF BIRTH:  03-13-1936  DATE OF ADMISSION:  05/27/2011 DATE OF DISCHARGE:                             HISTORY & PHYSICAL   PRIMARY CARE PHYSICIAN:  Ernestina Penna, MD, Family Practice.  GENERAL SURGERY:  Troy Sine. Dwain Sarna, MD  NEUROSURGEON:  Payton Doughty, MD  NEPHROLOGY:  Mindi Slicker. Lowell Guitar, MD  ADVANCE DIRECTIVE:  Full code.  REASON FOR ADMISSION:  Intractable nausea, vomiting, and UTI.  HISTORY OF PRESENT ILLNESS:  This is a 75 year old female with history of prior diverticulitis status post diverting ileocolostomy after sigmoid colectomy, status post knee replacement surgery, diabetes, renal disease status post bilateral ureteral stents, presents with several days of abdominal pain, nausea, and vomiting.  There has been no fever and no change in her ileostomy output (on the left).  Evaluation in the emergency room included a urinalysis which was positive for a urinary tract infection.  A CT abdominopelvic showed persistent large left parastomal hernia, but without any obstruction, and progression of T12 compressive fracture.  She has a normal white count and a normal hemoglobin of 12.1, normal BUN of 22, normal creatinine of 1.42, and her potassium is 4.1. Plain film of her abdomen showed ileus versus small bowel obstruction.  Hospitalist was asked to admit the patient for treatment of urinary tract infection and for intractable nausea and vomiting.  PAST MEDICAL HISTORY:  As above.  ALLERGIES:  Multiple including, 1. ULTRAM. 2. ZETIA. 3. ZOCOR. 4. ACTOS. 5. ASPIRIN. 6. CODEINE. 7. CRESTOR. 8. LESCOL. 9. LIPITOR. 10.METFORMIN. 11.MEVACOR. 12.PENICILLIN. 13.SULFA.  CURRENT MEDICATIONS:  Norvasc, Lopressor, doxazosin, Prilosec, Lovaza, Synthroid, aspirin, Tylenol,  regular insulin b.i.d.  Note that she is on aspirin, but yet it was listed as she is allergic to aspirin.  REVIEW OF SYSTEMS:  Otherwise unremarkable.  FAMILY HISTORY:  Noncontributory.  PHYSICAL EXAMINATION:  GENERAL:  She appeared tired and dehydrated. VITAL SIGNS:  Blood pressure 139/70, pulse of 100, respiratory rate of 18, temperature 98.4. HEENT:  She is conversing.  Throat is clear.  Tongue is midline. NECK:  Supple. CARDIAC:  S1 and S2, regular. LUNGS:  Clear. ABDOMEN:  Obese, nontender, no rebound.  She has an ileostomy on her left.  No palpable mass. EXTREMITIES:  No edema.  No calf tenderness. SKIN:  Warm and dry. VASCULAR:  Adequate distal pulses bilaterally.  OBJECTIVE FINDINGS:  Urinalysis show many epithelial cells, 21-50 WBCs, 0-2 RBCs, and few bacteria.  White count of 10,100, hemoglobin is 12.1, MCV of 93, platelet count of 236,000.  Potassium of 4.1, glucose of 121, creatinine of 1.42.  Liver function tests are normal.  Blood glucose 133.  Abdominal series showed mild ileus versus early partial obstruction.  Abdominal CAT scan showed parastomal hernia with no obstruction and progression of T12 compressive fracture with fatty liver and coronary calcification, but no other significant finding.  IMPRESSION:  This is a 75 year old diabetic patient with ileostomy along with prior renal disease, presented with intractable  nausea and vomiting.  There is no positive finding except that she has a urinary tract infection.  We will give her bowel rest and replete her with intravenous fluid.  We will treat her urinary tract infection with Rocephin. As soon as her medications are reconciled, we will continue them also. She is a full code.  We will admit to [facility] 4.  For diabetes, we will give sensitive insulin and give her clear liquids and advance as tolerated.  She is stable otherwise and is a full code.     Houston Siren, MD     PL/MEDQ  D:  05/28/2011   T:  05/28/2011  Job:  [id number]  cc:   Ernestina Penna, M.D. Fax: [phone]  Juanetta Gosling, MD [address] [place] [address]  Payton Doughty, M.D. Fax: [phone]  Mindi Slicker. Lowell Guitar, M.D. Fax: [phone]  Electronically Signed by Houston Siren  on 06/21/2011 09:36:43 PM

## 2011-06-24 ENCOUNTER — Telehealth (INDEPENDENT_AMBULATORY_CARE_PROVIDER_SITE_OTHER): Payer: Self-pay | Admitting: General Surgery

## 2011-06-25 ENCOUNTER — Telehealth (INDEPENDENT_AMBULATORY_CARE_PROVIDER_SITE_OTHER): Payer: Self-pay | Admitting: General Surgery

## 2011-06-25 NOTE — Telephone Encounter (Signed)
Patient's husband left voicemail to make [name] aware they are now using barriers for the ostomy pouch. They have sent a request to [facility] for more barriers and they will be sending [name] something to sign. They wanted to make [name] aware to look for this.

## 2011-08-02 LAB — COMPREHENSIVE METABOLIC PANEL
ALT: 20
Alkaline Phosphatase: 49
Glucose, Bld: 146 — ABNORMAL HIGH
Potassium: 3.4 — ABNORMAL LOW
Sodium: 139
Total Protein: 6.4

## 2011-08-02 LAB — URINALYSIS, ROUTINE W REFLEX MICROSCOPIC
Nitrite: NEGATIVE
Specific Gravity, Urine: 1.031 — ABNORMAL HIGH
Urobilinogen, UA: 1
pH: 5.5

## 2011-08-02 LAB — GLUCOSE, CAPILLARY
Glucose-Capillary: 109 — ABNORMAL HIGH
Glucose-Capillary: 125 — ABNORMAL HIGH
Glucose-Capillary: 126 — ABNORMAL HIGH
Glucose-Capillary: 136 — ABNORMAL HIGH
Glucose-Capillary: 140 — ABNORMAL HIGH
Glucose-Capillary: 170 — ABNORMAL HIGH
Glucose-Capillary: 175 — ABNORMAL HIGH
Glucose-Capillary: 175 — ABNORMAL HIGH
Glucose-Capillary: 176 — ABNORMAL HIGH

## 2011-08-02 LAB — BASIC METABOLIC PANEL
Calcium: 7.6 — ABNORMAL LOW
Creatinine, Ser: 0.84
GFR calc Af Amer: >60
GFR calc non Af Amer: >60
Sodium: 138

## 2011-08-02 LAB — CBC
Hemoglobin: 11.7 — ABNORMAL LOW
Hemoglobin: 9.9 — ABNORMAL LOW
RBC: 3.03 — ABNORMAL LOW
RBC: 3.69 — ABNORMAL LOW
RDW: 13.8

## 2011-08-02 LAB — DIFFERENTIAL
Basophils Relative: 1
Eosinophils Absolute: 0
Eosinophils Relative: 0
Monocytes Absolute: 1
Monocytes Relative: 8
Neutrophils Relative %: 79 — ABNORMAL HIGH

## 2011-08-02 LAB — URINE MICROSCOPIC-ADD ON

## 2011-08-02 LAB — HEMOGLOBIN A1C
Hgb A1c MFr Bld: 7.6 — ABNORMAL HIGH
Mean Plasma Glucose: 171

## 2011-08-02 LAB — LACTIC ACID, PLASMA: Lactic Acid, Venous: 0.9

## 2011-08-02 LAB — APTT: aPTT: 27

## 2011-08-05 LAB — COMPREHENSIVE METABOLIC PANEL
ALT: 11 U/L (ref 0–35)
ALT: 17 U/L (ref 0–35)
AST: 11 U/L (ref 0–37)
AST: 12 U/L (ref 0–37)
AST: 23 U/L (ref 0–37)
Albumin: 1.6 g/dL — ABNORMAL LOW (ref 3.5–5.2)
Albumin: 1.8 g/dL — ABNORMAL LOW (ref 3.5–5.2)
Albumin: 3.3 g/dL — ABNORMAL LOW (ref 3.5–5.2)
Alkaline Phosphatase: 42 U/L (ref 39–117)
Alkaline Phosphatase: 71 U/L (ref 39–117)
Alkaline Phosphatase: 82 U/L (ref 39–117)
BUN: 10 mg/dL (ref 6–23)
BUN: 16 mg/dL (ref 6–23)
BUN: 5 mg/dL — ABNORMAL LOW (ref 6–23)
BUN: 6 mg/dL (ref 6–23)
CO2: 23 mEq/L (ref 19–32)
Calcium: 7.8 mg/dL — ABNORMAL LOW (ref 8.4–10.5)
Chloride: 102 mEq/L (ref 96–112)
Chloride: 102 mEq/L (ref 96–112)
Chloride: 103 mEq/L (ref 96–112)
Chloride: 105 mEq/L (ref 96–112)
Creatinine, Ser: 0.62 mg/dL (ref 0.4–1.2)
Creatinine, Ser: 0.64 mg/dL (ref 0.4–1.2)
Creatinine, Ser: 0.77 mg/dL (ref 0.4–1.2)
GFR calc Af Amer: >60 mL/min (ref >60)
GFR calc non Af Amer: >60 mL/min (ref >60)
Glucose, Bld: 125 mg/dL — ABNORMAL HIGH (ref 70–99)
Glucose, Bld: 273 mg/dL — ABNORMAL HIGH (ref 70–99)
Potassium: 3.6 mEq/L (ref 3.5–5.1)
Potassium: 4.1 mEq/L (ref 3.5–5.1)
Potassium: 4.1 mEq/L (ref 3.5–5.1)
Sodium: 134 mEq/L — ABNORMAL LOW (ref 135–145)
Sodium: 140 mEq/L (ref 135–145)
Total Bilirubin: 0.5 mg/dL (ref 0.3–1.2)
Total Bilirubin: 0.5 mg/dL (ref 0.3–1.2)
Total Protein: 4.4 g/dL — ABNORMAL LOW (ref 6.0–8.3)
Total Protein: 5.2 g/dL — ABNORMAL LOW (ref 6.0–8.3)
Total Protein: 5.5 g/dL — ABNORMAL LOW (ref 6.0–8.3)
Total Protein: 6.5 g/dL (ref 6.0–8.3)

## 2011-08-05 LAB — CBC
HCT: 24.3 % — ABNORMAL LOW (ref 36.0–46.0)
HCT: 25.6 % — ABNORMAL LOW (ref 36.0–46.0)
HCT: 26.5 % — ABNORMAL LOW (ref 36.0–46.0)
HCT: 27.3 % — ABNORMAL LOW (ref 36.0–46.0)
HCT: 27.5 % — ABNORMAL LOW (ref 36.0–46.0)
HCT: 29.8 % — ABNORMAL LOW (ref 36.0–46.0)
HCT: 32 % — ABNORMAL LOW (ref 36.0–46.0)
HCT: 35 % — ABNORMAL LOW (ref 36.0–46.0)
Hemoglobin: 10 g/dL — ABNORMAL LOW (ref 12.0–15.0)
Hemoglobin: 10.1 g/dL — ABNORMAL LOW (ref 12.0–15.0)
Hemoglobin: 10.4 g/dL — ABNORMAL LOW (ref 12.0–15.0)
Hemoglobin: 11.9 g/dL — ABNORMAL LOW (ref 12.0–15.0)
Hemoglobin: 9.1 g/dL — ABNORMAL LOW (ref 12.0–15.0)
Hemoglobin: 9.8 g/dL — ABNORMAL LOW (ref 12.0–15.0)
MCHC: 32.6 g/dL (ref 30.0–36.0)
MCHC: 33.2 g/dL (ref 30.0–36.0)
MCHC: 33.2 g/dL (ref 30.0–36.0)
MCHC: 33.8 g/dL (ref 30.0–36.0)
MCV: 91 fL (ref 78.0–100.0)
MCV: 92.1 fL (ref 78.0–100.0)
MCV: 92.8 fL (ref 78.0–100.0)
MCV: 93.4 fL (ref 78.0–100.0)
MCV: 93.4 fL (ref 78.0–100.0)
MCV: 93.5 fL (ref 78.0–100.0)
Platelets: 205 10*3/uL (ref 150–400)
Platelets: 243 10*3/uL (ref 150–400)
Platelets: 338 10*3/uL (ref 150–400)
Platelets: 378 10*3/uL (ref 150–400)
Platelets: 393 10*3/uL (ref 150–400)
Platelets: 420 10*3/uL — ABNORMAL HIGH (ref 150–400)
RBC: 2.75 MIL/uL — ABNORMAL LOW (ref 3.87–5.11)
RBC: 2.95 MIL/uL — ABNORMAL LOW (ref 3.87–5.11)
RBC: 3.1 MIL/uL — ABNORMAL LOW (ref 3.87–5.11)
RBC: 3.28 MIL/uL — ABNORMAL LOW (ref 3.87–5.11)
RBC: 3.52 MIL/uL — ABNORMAL LOW (ref 3.87–5.11)
RDW: 14.3 % (ref 11.5–15.5)
RDW: 14.8 % (ref 11.5–15.5)
RDW: 14.9 % (ref 11.5–15.5)
RDW: 15.4 % (ref 11.5–15.5)
RDW: 15.6 % — ABNORMAL HIGH (ref 11.5–15.5)
RDW: 15.9 % — ABNORMAL HIGH (ref 11.5–15.5)
RDW: 16 % — ABNORMAL HIGH (ref 11.5–15.5)
RDW: 16.7 % — ABNORMAL HIGH (ref 11.5–15.5)
WBC: 11 10*3/uL — ABNORMAL HIGH (ref 4.0–10.5)
WBC: 4.5 10*3/uL (ref 4.0–10.5)
WBC: 6.3 10*3/uL (ref 4.0–10.5)
WBC: 7.7 10*3/uL (ref 4.0–10.5)
WBC: 8.5 10*3/uL (ref 4.0–10.5)
WBC: 9.9 10*3/uL (ref 4.0–10.5)

## 2011-08-05 LAB — URINE CULTURE: Colony Count: 75000

## 2011-08-05 LAB — DIFFERENTIAL
Basophils Absolute: 0 10*3/uL (ref 0.0–0.1)
Basophils Relative: 0 % (ref 0–1)
Eosinophils Absolute: 0.2 10*3/uL (ref 0.0–0.7)
Eosinophils Relative: 4 % (ref 0–5)
Lymphocytes Relative: 18 % (ref 12–46)
Lymphs Abs: 1.4 10*3/uL (ref 0.7–4.0)
Monocytes Absolute: 0.5 10*3/uL (ref 0.1–1.0)
Monocytes Absolute: 1 10*3/uL (ref 0.1–1.0)
Monocytes Relative: 11 % (ref 3–12)
Monocytes Relative: 13 % — ABNORMAL HIGH (ref 3–12)
Neutro Abs: 2.3 10*3/uL (ref 1.7–7.7)
Neutro Abs: 5.3 10*3/uL (ref 1.7–7.7)
Neutrophils Relative %: 67 % (ref 43–77)

## 2011-08-05 LAB — GLUCOSE, CAPILLARY
Glucose-Capillary: 116 mg/dL — ABNORMAL HIGH (ref 70–99)
Glucose-Capillary: 121 mg/dL — ABNORMAL HIGH (ref 70–99)
Glucose-Capillary: 124 mg/dL — ABNORMAL HIGH (ref 70–99)
Glucose-Capillary: 131 mg/dL — ABNORMAL HIGH (ref 70–99)
Glucose-Capillary: 133 mg/dL — ABNORMAL HIGH (ref 70–99)
Glucose-Capillary: 137 mg/dL — ABNORMAL HIGH (ref 70–99)
Glucose-Capillary: 140 mg/dL — ABNORMAL HIGH (ref 70–99)
Glucose-Capillary: 144 mg/dL — ABNORMAL HIGH (ref 70–99)
Glucose-Capillary: 146 mg/dL — ABNORMAL HIGH (ref 70–99)
Glucose-Capillary: 147 mg/dL — ABNORMAL HIGH (ref 70–99)
Glucose-Capillary: 148 mg/dL — ABNORMAL HIGH (ref 70–99)
Glucose-Capillary: 149 mg/dL — ABNORMAL HIGH (ref 70–99)
Glucose-Capillary: 153 mg/dL — ABNORMAL HIGH (ref 70–99)
Glucose-Capillary: 155 mg/dL — ABNORMAL HIGH (ref 70–99)
Glucose-Capillary: 155 mg/dL — ABNORMAL HIGH (ref 70–99)
Glucose-Capillary: 162 mg/dL — ABNORMAL HIGH (ref 70–99)
Glucose-Capillary: 163 mg/dL — ABNORMAL HIGH (ref 70–99)
Glucose-Capillary: 165 mg/dL — ABNORMAL HIGH (ref 70–99)
Glucose-Capillary: 166 mg/dL — ABNORMAL HIGH (ref 70–99)
Glucose-Capillary: 167 mg/dL — ABNORMAL HIGH (ref 70–99)
Glucose-Capillary: 167 mg/dL — ABNORMAL HIGH (ref 70–99)
Glucose-Capillary: 168 mg/dL — ABNORMAL HIGH (ref 70–99)
Glucose-Capillary: 171 mg/dL — ABNORMAL HIGH (ref 70–99)
Glucose-Capillary: 177 mg/dL — ABNORMAL HIGH (ref 70–99)
Glucose-Capillary: 178 mg/dL — ABNORMAL HIGH (ref 70–99)
Glucose-Capillary: 182 mg/dL — ABNORMAL HIGH (ref 70–99)
Glucose-Capillary: 184 mg/dL — ABNORMAL HIGH (ref 70–99)
Glucose-Capillary: 198 mg/dL — ABNORMAL HIGH (ref 70–99)
Glucose-Capillary: 199 mg/dL — ABNORMAL HIGH (ref 70–99)
Glucose-Capillary: 206 mg/dL — ABNORMAL HIGH (ref 70–99)
Glucose-Capillary: 215 mg/dL — ABNORMAL HIGH (ref 70–99)
Glucose-Capillary: 218 mg/dL — ABNORMAL HIGH (ref 70–99)
Glucose-Capillary: 226 mg/dL — ABNORMAL HIGH (ref 70–99)
Glucose-Capillary: 238 mg/dL — ABNORMAL HIGH (ref 70–99)
Glucose-Capillary: 241 mg/dL — ABNORMAL HIGH (ref 70–99)
Glucose-Capillary: 246 mg/dL — ABNORMAL HIGH (ref 70–99)
Glucose-Capillary: 273 mg/dL — ABNORMAL HIGH (ref 70–99)
Glucose-Capillary: 64 mg/dL — ABNORMAL LOW (ref 70–99)
Glucose-Capillary: 70 mg/dL (ref 70–99)

## 2011-08-05 LAB — POCT I-STAT EG7
Acid-base deficit: 5 mmol/L — ABNORMAL HIGH (ref 0.0–2.0)
Bicarbonate: 21.1 mEq/L (ref 20.0–24.0)
Calcium, Ion: 1.22 mmol/L (ref 1.12–1.32)
O2 Saturation: 89 %
Patient temperature: 36
TCO2: 22 mmol/L (ref 0–100)
pCO2, Ven: 41.8 mmHg — ABNORMAL LOW (ref 45.0–50.0)
pO2, Ven: 58 mmHg — ABNORMAL HIGH (ref 30.0–45.0)

## 2011-08-05 LAB — BASIC METABOLIC PANEL
BUN: 2 mg/dL — ABNORMAL LOW (ref 6–23)
BUN: 3 mg/dL — ABNORMAL LOW (ref 6–23)
BUN: 3 mg/dL — ABNORMAL LOW (ref 6–23)
BUN: 4 mg/dL — ABNORMAL LOW (ref 6–23)
BUN: 7 mg/dL (ref 6–23)
CO2: 25 mEq/L (ref 19–32)
CO2: 26 mEq/L (ref 19–32)
CO2: 26 mEq/L (ref 19–32)
CO2: 27 mEq/L (ref 19–32)
CO2: 29 mEq/L (ref 19–32)
Calcium: 7.8 mg/dL — ABNORMAL LOW (ref 8.4–10.5)
Calcium: 7.8 mg/dL — ABNORMAL LOW (ref 8.4–10.5)
Calcium: 8 mg/dL — ABNORMAL LOW (ref 8.4–10.5)
Calcium: 8.1 mg/dL — ABNORMAL LOW (ref 8.4–10.5)
Chloride: 101 mEq/L (ref 96–112)
Chloride: 102 mEq/L (ref 96–112)
Chloride: 103 mEq/L (ref 96–112)
Chloride: 107 mEq/L (ref 96–112)
Creatinine, Ser: 0.66 mg/dL (ref 0.4–1.2)
Creatinine, Ser: 0.68 mg/dL (ref 0.4–1.2)
Creatinine, Ser: 0.73 mg/dL (ref 0.4–1.2)
GFR calc Af Amer: >60 mL/min (ref >60)
GFR calc Af Amer: >60 mL/min (ref >60)
GFR calc non Af Amer: 54 mL/min — ABNORMAL LOW (ref >60)
GFR calc non Af Amer: >60 mL/min (ref >60)
GFR calc non Af Amer: >60 mL/min (ref >60)
GFR calc non Af Amer: >60 mL/min (ref >60)
GFR calc non Af Amer: >60 mL/min (ref >60)
Glucose, Bld: 151 mg/dL — ABNORMAL HIGH (ref 70–99)
Glucose, Bld: 161 mg/dL — ABNORMAL HIGH (ref 70–99)
Glucose, Bld: 190 mg/dL — ABNORMAL HIGH (ref 70–99)
Glucose, Bld: 210 mg/dL — ABNORMAL HIGH (ref 70–99)
Glucose, Bld: 212 mg/dL — ABNORMAL HIGH (ref 70–99)
Glucose, Bld: 219 mg/dL — ABNORMAL HIGH (ref 70–99)
Potassium: 3.8 mEq/L (ref 3.5–5.1)
Potassium: 3.9 mEq/L (ref 3.5–5.1)
Potassium: 3.9 mEq/L (ref 3.5–5.1)
Potassium: 4 mEq/L (ref 3.5–5.1)
Potassium: 4.2 mEq/L (ref 3.5–5.1)
Sodium: 133 mEq/L — ABNORMAL LOW (ref 135–145)
Sodium: 135 mEq/L (ref 135–145)
Sodium: 136 mEq/L (ref 135–145)

## 2011-08-05 LAB — URINALYSIS, ROUTINE W REFLEX MICROSCOPIC
Bilirubin Urine: NEGATIVE
Hgb urine dipstick: NEGATIVE
Nitrite: NEGATIVE
Protein, ur: NEGATIVE mg/dL
Specific Gravity, Urine: 1.007 (ref 1.005–1.030)
Urobilinogen, UA: 0.2 mg/dL (ref 0.0–1.0)
Urobilinogen, UA: 1 mg/dL (ref 0.0–1.0)
pH: 7.5 (ref 5.0–8.0)

## 2011-08-05 LAB — CROSSMATCH: ABO/RH(D): O POS

## 2011-08-05 LAB — HEMOGLOBIN AND HEMATOCRIT, BLOOD: Hemoglobin: 7.5 g/dL — CL (ref 12.0–15.0)

## 2011-08-05 LAB — PHOSPHORUS
Phosphorus: 2.4 mg/dL (ref 2.3–4.6)
Phosphorus: 2.5 mg/dL (ref 2.3–4.6)

## 2011-08-05 LAB — CULTURE, BLOOD (ROUTINE X 2)
Culture: NO GROWTH
Culture: NO GROWTH

## 2011-08-05 LAB — CATH TIP CULTURE: Culture: NO GROWTH

## 2011-08-05 LAB — PREALBUMIN: Prealbumin: 7.4 mg/dL — ABNORMAL LOW (ref 18.0–45.0)

## 2011-08-05 LAB — CHOLESTEROL, TOTAL: Cholesterol: 67 mg/dL (ref 0–200)

## 2011-08-12 LAB — PROTIME-INR
INR: 2.4 — ABNORMAL HIGH
Prothrombin Time: 26.6 — ABNORMAL HIGH

## 2011-08-12 LAB — HEMOGLOBIN AND HEMATOCRIT, BLOOD
HCT: 32.1 — ABNORMAL LOW
Hemoglobin: 11.1 — ABNORMAL LOW

## 2011-08-13 LAB — URINALYSIS, ROUTINE W REFLEX MICROSCOPIC
Glucose, UA: NEGATIVE
Hgb urine dipstick: NEGATIVE
Protein, ur: NEGATIVE
Specific Gravity, Urine: 1.018
pH: 6

## 2011-08-13 LAB — PROTIME-INR
INR: 2.1 — ABNORMAL HIGH
INR: 2.1 — ABNORMAL HIGH
INR: 2.2 — ABNORMAL HIGH
Prothrombin Time: 14.3
Prothrombin Time: 24.6 — ABNORMAL HIGH
Prothrombin Time: 24.7 — ABNORMAL HIGH
Prothrombin Time: 26 — ABNORMAL HIGH

## 2011-08-13 LAB — DIFFERENTIAL
Basophils Relative: 1
Eosinophils Absolute: 0.2
Eosinophils Relative: 4
Lymphs Abs: 2.2
Monocytes Relative: 10

## 2011-08-13 LAB — CBC
HCT: 23.2 — ABNORMAL LOW
HCT: 23.5 — ABNORMAL LOW
HCT: 25.7 — ABNORMAL LOW
Hemoglobin: 8.1 — ABNORMAL LOW
Hemoglobin: 8.1 — ABNORMAL LOW
Hemoglobin: 8.2 — ABNORMAL LOW
Hemoglobin: 9 — ABNORMAL LOW
Hemoglobin: 10.9 — ABNORMAL LOW
MCHC: 34.8
MCHC: 35.6
MCHC: 34.2
MCV: 90.8
MCV: 93.1
Platelets: 231
RBC: 2.52 — ABNORMAL LOW
RBC: 2.53 — ABNORMAL LOW
RBC: 2.57 — ABNORMAL LOW
RBC: 2.81 — ABNORMAL LOW
RBC: 3.47 — ABNORMAL LOW
WBC: 6.4
WBC: 6.9
WBC: 7.6
WBC: 7.7

## 2011-08-13 LAB — BASIC METABOLIC PANEL
BUN: 13
CO2: 26
Chloride: 103
Chloride: 104
Creatinine, Ser: 0.84
Creatinine, Ser: 0.91
GFR calc Af Amer: >60
GFR calc non Af Amer: >60
Glucose, Bld: 227 — ABNORMAL HIGH
Potassium: 3.5
Potassium: 4.2

## 2011-08-13 LAB — COMPREHENSIVE METABOLIC PANEL
ALT: 19
AST: 25
Alkaline Phosphatase: 43
CO2: 28
Calcium: 9.2
GFR calc Af Amer: 58 — ABNORMAL LOW
GFR calc non Af Amer: 48 — ABNORMAL LOW
Potassium: 3.8
Sodium: 141
Total Protein: 6.7

## 2011-08-13 LAB — TYPE AND SCREEN: ABO/RH(D): O POS

## 2011-08-13 LAB — CROSSMATCH

## 2011-08-13 LAB — ABO/RH: ABO/RH(D): O POS

## 2011-08-13 LAB — URINE MICROSCOPIC-ADD ON

## 2011-08-20 ENCOUNTER — Other Ambulatory Visit (HOSPITAL_COMMUNITY): Payer: Self-pay | Admitting: Orthopedic Surgery

## 2011-08-20 DIAGNOSIS — M25561 Pain in right knee: Secondary | ICD-10-CM

## 2011-08-31 ENCOUNTER — Ambulatory Visit (HOSPITAL_COMMUNITY): Payer: Medicare Other

## 2011-08-31 ENCOUNTER — Encounter (HOSPITAL_COMMUNITY)
Admission: RE | Admit: 2011-08-31 | Discharge: 2011-08-31 | Disposition: A | Discharge status: Home / Self Care | Payer: Medicare Other | Source: Ambulatory Visit | Attending: Orthopedic Surgery | Admitting: Orthopedic Surgery

## 2011-08-31 DIAGNOSIS — Z96659 Presence of unspecified artificial knee joint: Secondary | ICD-10-CM | POA: Insufficient documentation

## 2011-08-31 DIAGNOSIS — M25569 Pain in unspecified knee: Secondary | ICD-10-CM | POA: Insufficient documentation

## 2011-08-31 DIAGNOSIS — M25561 Pain in right knee: Secondary | ICD-10-CM

## 2011-08-31 MED ORDER — TECHNETIUM TC 99M MEDRONATE IV KIT
24.0000 | PACK | Freq: Once | INTRAVENOUS | Status: AC | PRN
  Administered 2011-08-31: 24 via INTRAVENOUS

## 2011-09-14 ENCOUNTER — Ambulatory Visit: Payer: Medicare Other | Admitting: Internal Medicine

## 2011-09-14 ENCOUNTER — Encounter: Payer: Self-pay | Admitting: Internal Medicine

## 2011-09-14 ENCOUNTER — Ambulatory Visit (INDEPENDENT_AMBULATORY_CARE_PROVIDER_SITE_OTHER): Payer: Medicare Other | Admitting: Internal Medicine

## 2011-09-14 DIAGNOSIS — I6350 Cerebral infarction due to unspecified occlusion or stenosis of unspecified cerebral artery: Secondary | ICD-10-CM

## 2011-09-14 DIAGNOSIS — M25569 Pain in unspecified knee: Secondary | ICD-10-CM

## 2011-09-14 DIAGNOSIS — Z9889 Other specified postprocedural states: Secondary | ICD-10-CM | POA: Insufficient documentation

## 2011-09-14 DIAGNOSIS — J189 Pneumonia, unspecified organism: Secondary | ICD-10-CM

## 2011-09-14 DIAGNOSIS — N189 Chronic kidney disease, unspecified: Secondary | ICD-10-CM

## 2011-09-14 DIAGNOSIS — I639 Cerebral infarction, unspecified: Secondary | ICD-10-CM | POA: Insufficient documentation

## 2011-09-14 DIAGNOSIS — Z8614 Personal history of Methicillin resistant Staphylococcus aureus infection: Secondary | ICD-10-CM | POA: Insufficient documentation

## 2011-09-14 DIAGNOSIS — N184 Chronic kidney disease, stage 4 (severe): Secondary | ICD-10-CM | POA: Insufficient documentation

## 2011-09-14 DIAGNOSIS — Z932 Ileostomy status: Secondary | ICD-10-CM

## 2011-09-14 NOTE — Progress Notes (Signed)
  Subjective:    Patient ID: [name], female    DOB: 12-18-1935, 75 y.o.   MRN: [id number]  HPI [name] is a 75 year old who is referred to me by Dr. Rosanne Ashing [place] for evaluation of right knee pain and possible infection. She underwent right total knee arthroplasty in 2008 and had an uneventful recovery. About 2 months ago she began to notice some swelling just distal and medial to the knee associated with sensitivity of the skin and intermittent pain. She did not have any fall or other injuries prior to onset of the symptoms. Initially the pain was there everyday and would sometimes get as severe as 8/10.  Dr. Leslee Home ordered a bone scan which showed increased activity compatible with mild loosening of the tibial component of the prosthesis. A sedimentation rate was obtained which was slightly elevated at 40. Over the past month the pain has actually been slightly less frequent and severe. In the last week it has only been 5 or 6/10 at its most severe.  She has significant comorbid conditions that greatly limit her functional capacity. She and her husband specifically note that she has little problems tolerating her knee pain at this time and is much more limited by her chronic back pain. She spends most of her day in her recliner. She is able to do some very brief physical therapy several times a week.  She has not been on any antibiotic therapy recently.    Review of Systems  Constitutional: Negative for fever, chills, diaphoresis, activity change and appetite change.  Musculoskeletal: Positive for back pain.       Objective:   Physical Exam  Constitutional: No distress.  Musculoskeletal:       She has 2 large healed surgical incisions on her right knee. She points out an area that she says is puffy and slightly swollen but I have no baseline for comparison. There is no erythema or warmth. She has minimal discomfort with range of motion and is able to walk with the aid of  a cane without much difficulty.          Assessment & Plan:

## 2011-09-14 NOTE — Assessment & Plan Note (Signed)
New onset of knee pain associated with a positive bone scan certainly does raise the issue of chronic smoldering infection of the prosthetic joint but her findings are certainly not absolutely diagnostic of infection. The fact that she's had improvement in her pain over the last month would speak against this being infection. At this point since she has very little difficulty tolerating the pain and it is not limiting her usual activities I would suggest that we simply follow off of antibiotics. If she has worsening pain and/or swelling I would suggest a diagnostic arthrocentesis to try to confirm infection and give [name] a specific pathogen to target with antibiotic therapy.  I would not want to put her on empiric therapy at this time because antibiotics alone the would never be curative. Given her significant comorbidities she is at very high risk for any surgery and would only want that as an absolute last resort. If she were to worsen and we were able to confirm infection and a specific pathogen then she might be a candidate for a trial of long-term suppressive antibiotic therapy. I've asked her to call me if she has worsening symptoms and I will coordinate further diagnostic workup with Dr. Leslee Home. She and her husband are in agreement with that plan.

## 2011-12-06 ENCOUNTER — Ambulatory Visit: Payer: Medicare Other | Attending: Neurosurgery | Admitting: Physical Therapy

## 2011-12-06 DIAGNOSIS — M545 Low back pain, unspecified: Secondary | ICD-10-CM | POA: Insufficient documentation

## 2011-12-06 DIAGNOSIS — R5381 Other malaise: Secondary | ICD-10-CM | POA: Insufficient documentation

## 2011-12-06 DIAGNOSIS — IMO0001 Reserved for inherently not codable concepts without codable children: Secondary | ICD-10-CM | POA: Insufficient documentation

## 2011-12-10 ENCOUNTER — Encounter: Payer: Medicare Other | Admitting: Physical Therapy

## 2011-12-13 ENCOUNTER — Ambulatory Visit: Payer: Medicare Other | Admitting: Physical Therapy

## 2011-12-17 ENCOUNTER — Ambulatory Visit: Payer: Medicare Other | Admitting: *Deleted

## 2011-12-20 ENCOUNTER — Ambulatory Visit: Payer: Medicare Other | Admitting: Physical Therapy

## 2011-12-24 ENCOUNTER — Ambulatory Visit: Payer: Medicare Other | Admitting: Physical Therapy

## 2011-12-27 ENCOUNTER — Ambulatory Visit: Payer: Medicare Other | Admitting: Physical Therapy

## 2011-12-30 ENCOUNTER — Encounter: Payer: Medicare Other | Admitting: Physical Therapy

## 2011-12-31 ENCOUNTER — Ambulatory Visit: Payer: Medicare Other | Attending: Neurosurgery | Admitting: *Deleted

## 2011-12-31 DIAGNOSIS — IMO0001 Reserved for inherently not codable concepts without codable children: Secondary | ICD-10-CM | POA: Insufficient documentation

## 2011-12-31 DIAGNOSIS — R5381 Other malaise: Secondary | ICD-10-CM | POA: Insufficient documentation

## 2011-12-31 DIAGNOSIS — M545 Low back pain, unspecified: Secondary | ICD-10-CM | POA: Insufficient documentation

## 2012-01-03 ENCOUNTER — Ambulatory Visit: Payer: Medicare Other | Attending: Neurosurgery | Admitting: Physical Therapy

## 2012-01-03 DIAGNOSIS — M545 Low back pain, unspecified: Secondary | ICD-10-CM | POA: Insufficient documentation

## 2012-01-03 DIAGNOSIS — IMO0001 Reserved for inherently not codable concepts without codable children: Secondary | ICD-10-CM | POA: Insufficient documentation

## 2012-01-03 DIAGNOSIS — R5381 Other malaise: Secondary | ICD-10-CM | POA: Insufficient documentation

## 2012-01-07 ENCOUNTER — Ambulatory Visit: Payer: Medicare Other | Admitting: Physical Therapy

## 2012-01-10 ENCOUNTER — Ambulatory Visit: Payer: Medicare Other | Admitting: Physical Therapy

## 2012-01-14 ENCOUNTER — Ambulatory Visit: Payer: Medicare Other | Admitting: Physical Therapy

## 2012-01-17 ENCOUNTER — Encounter: Payer: Medicare Other | Admitting: Physical Therapy

## 2012-01-18 ENCOUNTER — Ambulatory Visit: Payer: Medicare Other | Admitting: Physical Therapy

## 2012-01-21 ENCOUNTER — Encounter: Payer: Medicare Other | Admitting: *Deleted

## 2012-02-25 ENCOUNTER — Encounter: Payer: Self-pay | Admitting: Internal Medicine

## 2012-05-19 ENCOUNTER — Other Ambulatory Visit: Payer: Self-pay | Admitting: Family Medicine

## 2012-05-19 DIAGNOSIS — Z1231 Encounter for screening mammogram for malignant neoplasm of breast: Secondary | ICD-10-CM

## 2012-06-19 ENCOUNTER — Ambulatory Visit
Admission: RE | Admit: 2012-06-19 | Discharge: 2012-06-19 | Disposition: A | Discharge status: Home / Self Care | Payer: Medicare Other | Source: Ambulatory Visit | Attending: Family Medicine | Admitting: Family Medicine

## 2012-06-19 DIAGNOSIS — Z1231 Encounter for screening mammogram for malignant neoplasm of breast: Secondary | ICD-10-CM

## 2012-06-27 ENCOUNTER — Other Ambulatory Visit: Payer: Self-pay | Admitting: Dermatology

## 2012-10-18 LAB — HM DEXA SCAN

## 2012-12-19 ENCOUNTER — Telehealth (INDEPENDENT_AMBULATORY_CARE_PROVIDER_SITE_OTHER): Payer: Self-pay

## 2012-12-19 NOTE — Telephone Encounter (Signed)
Called pt's husband to let him know that we have the pt's rx for ostomy supplies written and we will mail it to them today. The husband understands.

## 2013-01-18 ENCOUNTER — Telehealth: Payer: Self-pay | Admitting: Family Medicine

## 2013-01-18 NOTE — Telephone Encounter (Signed)
Wants results of US done at [facility]

## 2013-01-18 NOTE — Telephone Encounter (Signed)
Called pt's husband with results of U/S.

## 2013-02-09 ENCOUNTER — Encounter: Payer: Self-pay | Admitting: Family Medicine

## 2013-02-19 ENCOUNTER — Ambulatory Visit (INDEPENDENT_AMBULATORY_CARE_PROVIDER_SITE_OTHER): Payer: PRIVATE HEALTH INSURANCE | Admitting: Pharmacist

## 2013-02-19 ENCOUNTER — Encounter: Payer: Self-pay | Admitting: Pharmacist

## 2013-02-19 VITALS — BP 154/70 | HR 74 | Ht 61.0 in | Wt 191.0 lb

## 2013-02-19 DIAGNOSIS — E119 Type 2 diabetes mellitus without complications: Secondary | ICD-10-CM

## 2013-02-19 DIAGNOSIS — E785 Hyperlipidemia, unspecified: Secondary | ICD-10-CM

## 2013-02-19 DIAGNOSIS — F411 Generalized anxiety disorder: Secondary | ICD-10-CM

## 2013-02-19 MED ORDER — ROSUVASTATIN CALCIUM 5 MG PO TABS: ORAL_TABLET | ORAL | Status: DC

## 2013-02-19 MED ORDER — INSULIN LISPRO PROT & LISPRO (50-50 MIX) 100 UNIT/ML ~~LOC~~ SUSP: SUBCUTANEOUS | Status: DC

## 2013-02-19 MED ORDER — DIAZEPAM 2 MG PO TABS: 2.0000 mg | ORAL_TABLET | ORAL | Status: DC

## 2013-02-19 NOTE — Patient Instructions (Addendum)
Goal BG Readings  Fasting (nothing to eat within 4 hours) = 80 - 130  2 hours after the start of a meal = less than 180   Increase Humalog 50/50 to 52 units before each morning meal and 40 units prior to each evening evening meal.  If after 7 days BG is above 120 in morning and 180 in evening then increase to 42 units.  Call office if experience hypoglycemia/low blood glucose  Humalog continue to use sliding scale at lunch 71-120     5 units 121-175   7 units 176 - 200   8 units 201 -  225  10 units 226 - 250    11 units 251 or above  12 units

## 2013-02-19 NOTE — Progress Notes (Signed)
Diabetes Follow-Up Visit Chief Complaint:   Chief Complaint  Patient presents with  . Diabetes    elevated a1c  . Hyperlipidemia     Filed Vitals:   02/19/13 1330  BP: 154/70  Pulse: 74   HPI: patient with difficult to control diabetes since metformin was discontinued secondary to elevated Scr.   Current Diabetes Medications:  Humalog 50/50 insulin 52 units qam and 36 units qpm        Humalog at lunch per sliding scale  Exam Edema:  trace  Polyuria:  negative  Polydipsia:  negative Polyphagia:  negative  BMI:  Body mass index is 36.11 kg/(m^2).   Weight changes:  stable General Appearance:  obese Mood/Affect:  normal   Low fat/carbohydrate diet?  Sometimes non compliant with recommended low CHO/low fat diet Nicotine Abuse?  No Medication Compliance?  Yes Exercise?  No Alcohol Abuse?  No  Home BG Monitoring:  Checking 2 and  2-3 times a day. Average:  160's High: 410  Low:  113    Last a1c = 8.5% (01/02/2013)  Scr = 1.21 (01/02/2013)   Lipid Panel (01/02/2013)  LDL-C = 145  Tg=354  HDL-C=57  Total = 273  ALT/AST and alk phos were elevated (01/02/2013)    Assessment: 1.  Diabetes.  uncontrolled 2.  Blood Pressure.  Elevated SBP  3.  Lipids.  Poorly controlled - LDL elevated and Tg elevated 4.  Elevated LFT's - mostly likely due to fatty liver and exacerbated by elevated Tg and LDL and poorly controlled diabetes   Recommendations: 1.  Medication recommendations at this time are as follows:  Increase Humalog 50/50 to 52units qam   and 40 units  qpm (If after 7 days BG is above 120 in morning and 180 in evening then increase   to 42 units qpm)   Call office if experience hypoglycemia/low blood glucose    Humalog continue to use sliding scale at lunch   71-120     5 units   121-175   7 units   176 - 200   8 units   201 -  225  10 units   226 - 250    11 units   251 or above  12 units   2.  Reviewed HBG goals:  Fasting 80-130 and 1-2 hour post  prandial <180.  Patient is instructed to check BG 3 times per day.    3.  BP goal < 140/80. 4.  LDL goal of < 100, HDL > 40 and TG < 150. 5.  Eye Exam yearly and Dental Exam every 6 months. 6.  Dietary recommendations:  Limit CHO intake 7.  Physical Activity recommendations:  Increase as able 8.  Start Crestor 5mg  take 1 tablet 2 times per week  9.  Check BP at home and bring results to office at next visit 10.  Return to clinic in 4-6 wks   Henrene Pastor, PharmD, CPP

## 2013-02-20 ENCOUNTER — Other Ambulatory Visit: Payer: Self-pay

## 2013-02-20 DIAGNOSIS — F411 Generalized anxiety disorder: Secondary | ICD-10-CM

## 2013-02-20 MED ORDER — DIAZEPAM 2 MG PO TABS: 2.0000 mg | ORAL_TABLET | ORAL | Status: DC

## 2013-02-20 NOTE — Telephone Encounter (Signed)
Last seen 01/02/13    Route to your nurse and have her call it in to pharmacy and notify patient

## 2013-02-26 NOTE — Telephone Encounter (Signed)
Verified with [facility] that prescription was called in.

## 2013-03-29 ENCOUNTER — Encounter: Payer: Self-pay | Admitting: Pharmacist

## 2013-03-29 ENCOUNTER — Ambulatory Visit (INDEPENDENT_AMBULATORY_CARE_PROVIDER_SITE_OTHER): Payer: PRIVATE HEALTH INSURANCE | Admitting: Pharmacist

## 2013-03-29 VITALS — BP 160/80 | HR 70 | Ht 61.0 in | Wt 191.0 lb

## 2013-03-29 DIAGNOSIS — E119 Type 2 diabetes mellitus without complications: Secondary | ICD-10-CM

## 2013-03-29 DIAGNOSIS — E559 Vitamin D deficiency, unspecified: Secondary | ICD-10-CM

## 2013-03-29 LAB — COMPLETE METABOLIC PANEL WITH GFR
ALT: 79 U/L — ABNORMAL HIGH (ref 0–35)
Albumin: 3.6 g/dL (ref 3.5–5.2)
CO2: 25 mEq/L (ref 19–32)
Calcium: 9.3 mg/dL (ref 8.4–10.5)
Chloride: 105 mEq/L (ref 96–112)
GFR, Est African American: 44 mL/min — ABNORMAL LOW
Sodium: 139 mEq/L (ref 135–145)
Total Protein: 7 g/dL (ref 6.0–8.3)

## 2013-03-29 LAB — POCT UA - MICROALBUMIN: Microalbumin Ur, POC: NEGATIVE mg/dL

## 2013-03-29 NOTE — Patient Instructions (Signed)
Medication recommendations at this time are as follows:  Increase Humalog 50/50 to 55 units qam   and 42 units  qpm    Call office if experience hypoglycemia/low blood glucose    Humalog continue to use sliding scale at lunch   71-120     5 units   121-175   7 units   176 - 200   8 units   201 -  225  10 units   226 - 250    11 units   251 or above  12 units

## 2013-03-29 NOTE — Progress Notes (Signed)
Diabetes Follow-Up Visit Chief Complaint:   Chief Complaint  Patient presents with  . Diabetes  . Hypertension     Filed Vitals:   03/29/13 1304  BP: 160/80  Pulse: 70   Filed Weights   03/29/13 1304  Weight: 191 lb (86.637 kg)    HPI: patient with difficult to control diabetes since metformin was discontinued secondary to elevated Scr.  She was seen last in April and insulin was increased.  Current Diabetes Medications:  Humalog 50/50 insulin 52 units qam and 40 units qpm        Humalog at lunch per sliding scale  Started crestor 10mg  1 tablet twice a week at last visit - per patient tolerating well  Exam Edema:  trace  Polyuria:  negative  Polydipsia:  negative Polyphagia:  negative  BMI:  Body mass index is 36.11 kg/(m^2).   Weight changes:  stable General Appearance:  obese Mood/Affect:  normal   Low fat/carbohydrate diet?  Mostly compliant Nicotine Abuse?  No Medication Compliance?  Usually but didn't take BP meds this am Exercise?  No Alcohol Abuse?  No  Home BG Monitoring:  Checking 2 and  2-3 times a day. Average:  183  High: 315  Low:  67 (1 episode of hypoglycemia since last visit)   A1C is 8.0% today - previously was 8.5% on 01/02/2013  Scr = 1.21 (01/02/2013)   Lipid Panel (01/02/2013)  LDL-C = 145  Tg=354  HDL-C=57  Total = 273  ALT/AST and alk phos were elevated (01/02/2013)    Assessment: 1.  Diabetes.  Uncontrolled but improving 2.  Blood Pressure.  Uncontrolled due to non-compliance 3.  Lipids.  Poorly controlled - LDL elevated and Tg elevated, patient not fasting today - will recheck lipids at appt with Dr. Christell Constant 4.  Elevated LFT's - mostly likely due to fatty liver and exacerbated by elevated Tg and LDL and poorly controlled diabetes   Recommendations: 1.  Medication recommendations at this time are as follows:  Increase Humalog 50/50 to 55 units qam   and 42 units  qpm    Call office if experience hypoglycemia/low blood  glucose    Humalog continue to use sliding scale at lunch   71-120     5 units   121-175   7 units   176 - 200   8 units   201 -  225  10 units   226 - 250    11 units   251 or above  12 units   2.  Reviewed HBG goals:  Fasting 80-130 and 1-2 hour post prandial <180.  Patient is instructed to check BG 3 times per day.    3.  BP goal < 140/80. 4.  LDL goal of < 100, HDL > 40 and TG < 150. Continue Crestor 10mg  1 tablet twice weekly 5.  Eye Exam yearly and Dental Exam every 6 months. 6.  Dietary recommendations:  Limit CHO intake 7.  Physical Activity recommendations:  Increase as able 8.  Again asked patient to check BP at home and bring results to office at next visit 9.  Return to clinic in 4 - Dr. Christell Constant, 8 week clinical pharmacist   Henrene Pastor, PharmD, CPP

## 2013-03-30 ENCOUNTER — Ambulatory Visit (INDEPENDENT_AMBULATORY_CARE_PROVIDER_SITE_OTHER): Payer: PRIVATE HEALTH INSURANCE | Admitting: Physician Assistant

## 2013-03-30 ENCOUNTER — Encounter: Payer: Self-pay | Admitting: Physician Assistant

## 2013-03-30 VITALS — BP 151/60 | HR 63 | Temp 97.9°F | Ht 61.0 in | Wt 191.0 lb

## 2013-03-30 DIAGNOSIS — D239 Other benign neoplasm of skin, unspecified: Secondary | ICD-10-CM

## 2013-03-30 DIAGNOSIS — R233 Spontaneous ecchymoses: Secondary | ICD-10-CM

## 2013-03-30 DIAGNOSIS — D229 Melanocytic nevi, unspecified: Secondary | ICD-10-CM

## 2013-03-30 NOTE — Patient Instructions (Signed)
Wound Care Wound care helps prevent pain and infection.  You may need a tetanus shot if:  You cannot remember when you had your last tetanus shot.  You have never had a tetanus shot.  The injury broke your skin. If you need a tetanus shot and you choose not to have one, you may get tetanus. Sickness from tetanus can be serious. HOME CARE   Only take medicine as told by your doctor.  Clean the wound daily with mild soap and water.  Change any bandages (dressings) as told by your doctor.  Put medicated cream and a bandage on the wound as told by your doctor.  Change the bandage if it gets wet, dirty, or starts to smell.  Take showers. Do not take baths, swim, or do anything that puts your wound under water.  Rest and raise (elevate) the wound until the pain and puffiness (swelling) are better.  Keep all doctor visits as told. GET HELP RIGHT AWAY IF:   Yellowish-white fluid (pus) comes from the wound.  Medicine does not lessen your pain.  There is a red streak going away from the wound.  You have a fever. MAKE SURE YOU:   Understand these instructions.  Will watch your condition.  Will get help right away if you are not doing well or get worse. Document Released: 07/27/2008 Document Revised: 01/10/2012 Document Reviewed: 02/21/2011 Exit[employer] Patient Information 2014 [employer].  

## 2013-03-30 NOTE — Progress Notes (Signed)
Subjective:     Patient ID: [name], female   DOB: September 20, 1936, 77 y.o.   MRN: [id number]  HPI Pt with mole to the L forearm for the last several months Pt concerned due to area enlarging and intermit bleeding Denies any pain to the site  Review of Systems  All other systems reviewed and are negative.       Objective:   Physical Exam Mole to the R mid forearm area ~.82mm Skin color and uniform Borders are symmet + evidence of bleeding No ulceration noted No induration Offered removal due to change Consent obtained Area cleansed with betadine .5cc lido w/epi used under sterile techniq Shave done Hyfrecated the base Dressing placed    Assessment:     1. Bleeding mole        Plan:     Wound care and S/S of infection reviewed Keep area clean and dry F/U pending path report

## 2013-04-02 ENCOUNTER — Telehealth: Payer: Self-pay | Admitting: Pharmacist

## 2013-04-02 ENCOUNTER — Encounter: Payer: Self-pay | Admitting: Pharmacist

## 2013-04-02 NOTE — Telephone Encounter (Signed)
Labs release to my chart.  Since this was first time - called to let patient know to expect results.   [name]

## 2013-04-05 ENCOUNTER — Other Ambulatory Visit: Payer: Self-pay | Admitting: Family Medicine

## 2013-04-05 NOTE — Telephone Encounter (Signed)
Called in.

## 2013-04-12 ENCOUNTER — Encounter: Payer: Self-pay | Admitting: *Deleted

## 2013-05-02 ENCOUNTER — Other Ambulatory Visit: Payer: Self-pay | Admitting: Family Medicine

## 2013-05-07 ENCOUNTER — Ambulatory Visit: Payer: Self-pay | Admitting: Family Medicine

## 2013-05-15 ENCOUNTER — Other Ambulatory Visit: Payer: Self-pay | Admitting: Family Medicine

## 2013-05-15 ENCOUNTER — Ambulatory Visit: Payer: Self-pay | Admitting: Family Medicine

## 2013-05-22 ENCOUNTER — Other Ambulatory Visit: Payer: Self-pay | Admitting: Family Medicine

## 2013-05-30 ENCOUNTER — Ambulatory Visit (INDEPENDENT_AMBULATORY_CARE_PROVIDER_SITE_OTHER): Payer: PRIVATE HEALTH INSURANCE | Admitting: Family Medicine

## 2013-05-30 ENCOUNTER — Encounter: Payer: Self-pay | Admitting: Family Medicine

## 2013-05-30 VITALS — BP 152/58 | HR 60 | Temp 97.3°F | Ht 61.0 in | Wt 189.8 lb

## 2013-05-30 DIAGNOSIS — R5381 Other malaise: Secondary | ICD-10-CM

## 2013-05-30 DIAGNOSIS — E119 Type 2 diabetes mellitus without complications: Secondary | ICD-10-CM

## 2013-05-30 DIAGNOSIS — R5383 Other fatigue: Secondary | ICD-10-CM

## 2013-05-30 DIAGNOSIS — N189 Chronic kidney disease, unspecified: Secondary | ICD-10-CM

## 2013-05-30 DIAGNOSIS — I10 Essential (primary) hypertension: Secondary | ICD-10-CM

## 2013-05-30 DIAGNOSIS — M1990 Unspecified osteoarthritis, unspecified site: Secondary | ICD-10-CM

## 2013-05-30 DIAGNOSIS — E559 Vitamin D deficiency, unspecified: Secondary | ICD-10-CM

## 2013-05-30 DIAGNOSIS — E785 Hyperlipidemia, unspecified: Secondary | ICD-10-CM

## 2013-05-30 LAB — POCT CBC
HCT, POC: 38.8 % (ref 37.7–47.9)
Hemoglobin: 12.9 g/dL (ref 12.2–16.2)
MCH, POC: 30.9 pg (ref 27–31.2)
MCHC: 33.2 g/dL (ref 31.8–35.4)
MPV: 8.8 fL (ref 0–99.8)
POC LYMPH PERCENT: 19.8 %L (ref 10–50)
RBC: 4.2 M/uL (ref 4.04–5.48)

## 2013-05-30 MED ORDER — LEVOTHYROXINE SODIUM 25 MCG PO TABS: ORAL_TABLET | ORAL | Status: DC

## 2013-05-30 MED ORDER — INSULIN LISPRO PROT & LISPRO (50-50 MIX) 100 UNIT/ML ~~LOC~~ SUSP: SUBCUTANEOUS | Status: DC

## 2013-05-30 NOTE — Patient Instructions (Signed)
Fall precautions discussed Continue current meds and therapeutic lifestyle changes 

## 2013-05-30 NOTE — Progress Notes (Signed)
  Subjective:    Patient ID: [name], female    DOB: [birth year], 77 y.o.   MRN: [id number]  HPI Patient comes in today for followup of chronic medical problems. These include diabetes mellitus, hyperlipidemia, hypertension, chronic kidney disease, and history of CVA. She comes in today with her husband. Patient says her blood sugars have been up and down. She has a scheduled appointment with the diabetic educator next week. At that point, we will relook at how we can get better control of blood sugar based on the readings that she brings in.   Review of Systems  Constitutional: Positive for fatigue.  HENT: Negative.  Negative for ear pain, congestion, sneezing and postnasal drip.   Eyes: Negative.  Negative for pain, redness, itching and visual disturbance.  Respiratory: Negative.  Negative for cough, choking, shortness of breath and wheezing.   Cardiovascular: Positive for leg swelling (intermitent).  Gastrointestinal: Negative.  Negative for abdominal pain.  Endocrine: Negative.   Genitourinary: Positive for frequency (at night). Negative for dysuria, vaginal bleeding, vaginal discharge and vaginal pain.  Musculoskeletal: Positive for back pain (LBP). Negative for arthralgias.  Skin: Negative.   Allergic/Immunologic: Positive for environmental allergies (seasonal).  Neurological: Negative.  Negative for dizziness, tremors, weakness, light-headedness and headaches.  Hematological: Negative.   Psychiatric/Behavioral: Negative for sleep disturbance. The patient is nervous/anxious (due to health issues).        Objective:   Physical Exam BP 152/58  Pulse 60  Temp(Src) 97.3 F (36.3 C) (Oral)  Ht 5\' 1"  (1.549 m)  Wt 189 lb 12.8 oz (86.093 kg)  BMI 35.88 kg/m2  The patient appeared well nourished and normally developed for her age, alert and oriented to time and place. Speech, behavior and judgement appear normal. Vital signs as documented.  Head exam is unremarkable. No  scleral icterus or pallor noted. Ears nose and throat were normal.  Neck is without jugular venous distension, thyromegally, or carotid bruits. Carotid upstrokes are brisk bilaterally. No cervical adenopathy. Lungs are clear anteriorly and posteriorly to auscultation. Normal respiratory effort. Cardiac exam reveals regular rate and rhythm at 72 per minute. First and second heart sounds normal.  No murmurs, rubs or gallops.  Abdominal exam reveals obesity, scar tissue from surgery, normal bowl sounds, no masses, no organomegaly and no aortic enlargement. No inguinal adenopathy. There is no abdominal tenderness. The ileostomy site appears non-irritated. Extremities are nonedematous and both femoral and pedal pulses are normal. Skin without pallor or jaundice.  Warm and dry, without rash. Neurologic exam reveals normal deep tendon reflexes and normal sensation. Diabetic foot exam was done          Assessment & Plan:  1. Type 2 diabetes mellitus - insulin lispro protamine-lispro (HUMALOG 50/50) (50-50) 100 UNIT/ML SUSP; 55 units each morning and 42 units each evening  Dispense: 10 mL; Refill: 5 - BMP8+EGFR - POCT glycosylated hemoglobin (Hb A1C)  2. Vitamin D insufficiency - Vitamin D 25 hydroxy  3. Dyslipidemia - Lipid panel - Hepatic function panel  4. Fatigue - POCT CBC  5. OSTEOARTHRITIS  6. HYPERTENSION  7. HYPERLIPIDEMIA  8. DIABETES MELLITUS, TYPE II  9. CRI (chronic renal insufficiency), unspecified stage  Patient Instructions  Fall precautions discussed Continue current meds and therapeutic lifestyle changes   Nyra Capes MD

## 2013-05-31 LAB — HEPATIC FUNCTION PANEL
ALT: 76 IU/L — ABNORMAL HIGH (ref 0–32)
AST: 94 IU/L — ABNORMAL HIGH (ref 0–40)
Albumin: 3.9 g/dL (ref 3.5–4.8)
Alkaline Phosphatase: 148 IU/L — ABNORMAL HIGH (ref 39–117)

## 2013-05-31 LAB — BMP8+EGFR
Calcium: 9.6 mg/dL (ref 8.6–10.2)
Creatinine, Ser: 1.55 mg/dL — ABNORMAL HIGH (ref 0.57–1.00)
GFR calc Af Amer: 37 mL/min/{1.73_m2} — ABNORMAL LOW (ref >59)
GFR calc non Af Amer: 33 mL/min/{1.73_m2} — ABNORMAL LOW (ref >59)
Potassium: 4.6 mmol/L (ref 3.5–5.2)
Sodium: 141 mmol/L (ref 134–144)

## 2013-05-31 LAB — LIPID PANEL
Cholesterol, Total: 251 mg/dL — ABNORMAL HIGH (ref 100–199)
HDL: 58 mg/dL (ref >39)
LDL Calculated: 130 mg/dL — ABNORMAL HIGH (ref 0–99)
Triglycerides: 313 mg/dL — ABNORMAL HIGH (ref 0–149)
VLDL Cholesterol Cal: 63 mg/dL — ABNORMAL HIGH (ref 5–40)

## 2013-05-31 LAB — VITAMIN D 25 HYDROXY (VIT D DEFICIENCY, FRACTURES): Vit D, 25-Hydroxy: 26.6 ng/mL — ABNORMAL LOW (ref 30.0–100.0)

## 2013-06-04 ENCOUNTER — Ambulatory Visit (INDEPENDENT_AMBULATORY_CARE_PROVIDER_SITE_OTHER): Payer: PRIVATE HEALTH INSURANCE | Admitting: Pharmacist

## 2013-06-04 VITALS — BP 146/72 | HR 62 | Ht 61.0 in | Wt 190.0 lb

## 2013-06-04 DIAGNOSIS — R799 Abnormal finding of blood chemistry, unspecified: Secondary | ICD-10-CM

## 2013-06-04 DIAGNOSIS — M810 Age-related osteoporosis without current pathological fracture: Secondary | ICD-10-CM

## 2013-06-04 DIAGNOSIS — E119 Type 2 diabetes mellitus without complications: Secondary | ICD-10-CM

## 2013-06-04 DIAGNOSIS — D473 Essential (hemorrhagic) thrombocythemia: Secondary | ICD-10-CM

## 2013-06-04 DIAGNOSIS — R7989 Other specified abnormal findings of blood chemistry: Secondary | ICD-10-CM

## 2013-06-04 DIAGNOSIS — E785 Hyperlipidemia, unspecified: Secondary | ICD-10-CM

## 2013-06-04 LAB — POCT CBC
Granulocyte percent: 69.2 %G (ref 37–80)
HCT, POC: 37.9 % (ref 37.7–47.9)
Lymph, poc: 2 (ref 0.6–3.4)
MCH, POC: 31.3 pg — AB (ref 27–31.2)
MCV: 93.1 fL (ref 80–97)
RDW, POC: 13.7 %
WBC: 7.4 10*3/uL (ref 4.6–10.2)

## 2013-06-04 MED ORDER — PITAVASTATIN CALCIUM 4 MG PO TABS: 4.0000 mg | ORAL_TABLET | Freq: Every evening | ORAL | Status: DC

## 2013-06-04 MED ORDER — INSULIN LISPRO PROT & LISPRO (50-50 MIX) 100 UNIT/ML ~~LOC~~ SUSP: SUBCUTANEOUS | Status: DC

## 2013-06-04 MED ORDER — DENOSUMAB 60 MG/ML ~~LOC~~ SOLN
60.0000 mg | Freq: Once | SUBCUTANEOUS | Status: AC
  Administered 2013-06-04: 60 mg via SUBCUTANEOUS

## 2013-06-04 NOTE — Progress Notes (Signed)
Diabetes Follow-Up Visit Chief Complaint:   Chief Complaint  Patient presents with  . Diabetes  . Osteoporosis     Filed Vitals:   06/04/13 1438  BP: 146/72  Pulse: 62   Filed Weights   06/04/13 1438  Weight: 190 lb (86.183 kg)    HPI: patient with difficult to control diabetes since metformin was discontinued secondary to elevated Scr.  She was seen by Dr Christell Constant last week and A1C has improved from 8.0% in May 2014 to 7.4% 05/30/13. She bring in HBG readings that show slightly high readings prior to lunch and in the morning.  She reports that each night she eat a snack prior to bedtime because she is afraid of hypoglycemia.  She has had 3 hypoglycemic events in last 6 weeks - 1 in the early am and 2 after supper.  Current Diabetes Medications:  Humalog 50/50 insulin 52 units qam and 42 units qpm        Humalog at lunch per sliding scale      71-120     5 units      121-175   7 units      176 - 200   8 units      201 -  225  10 units      226 - 250    11 units      251 or above  12 units   Started crestor 10mg  1 tablet twice a week at last visit - but patient has recently discontinued because felt it was causing myalgias Patient is due Prolia injection today for osteoporosis.   Exam Edema:  trace  Polyuria:  negative  Polydipsia:  negative Polyphagia:  negative  BMI:  Body mass index is 35.92 kg/(m^2).   Weight changes:  stable General Appearance:  obese Mood/Affect:  normal   Low fat/carbohydrate diet?  Mostly compliant Nicotine Abuse?  No Medication Compliance? No - quit crestor Exercise?  No Alcohol Abuse?  No  Home BG Monitoring:  Checking  2-3 times a day. Average:  142  High: 302  Low:  69  Ac1 was 7.4% (05/30/2013) A1C was 8.0%  05.2014 and  8.5% on 01/02/2013  Scr = 1.55 (05/30/2013)   Lipid Panel (05/30/2013)  LDL-C = 130  Tg=313  HDL-C=58  Total = 251   Assessment: 1.  Diabetes.  Uncontrolled but continues to improve - some concern about occ.  hypogycemia 2.  Blood Pressure.  Slightly elevated SBP today 3.  Lipids.  Poorly controlled - LDL elevated and Tg elevated, intolerance to many statins in past - simvastatin, atorvastatin, crestor 4.  Elevated LFT's - mostly likely due to fatty liver and exacerbated by elevated Tg and LDL and poorly controlled diabetes   Recommendations: 1.  Medication recommendations at this time are as follows:    Change Humalog 50/50 to 58 units qam and 38 units  qpm    Call office if experience hypoglycemia/low blood glucose   Humalog continue to use sliding scale at lunch   71-120     5 units   121-175   7 units   176 - 200   8 units   201 -  225  10 units   226 - 250    11 units   251 or above  12 units    Start Livalo 4mg  tablets 1/2 tablet daily - rx with coupon given  Prolia 60mg  SQ given today in office - next due 12/05/2013  2.  Reviewed HBG goals:  Fasting 80-130 and 1-2 hour post prandial <180.  Patient is instructed to check BG 3 times per day.    3.  BP goal < 140/80. 4.  LDL goal of < 100, HDL > 40 and TG < 150.  5.  Eye Exam yearly and Dental Exam every 6 months. 6.  Dietary recommendations:  Limit CHO intake 7.  Physical Activity recommendations:  Increase as able 8.  Again asked patient to check BP at home and bring results to office at next visit 9.  Return to clinic in 4 weeks to recheck lipids and 3 months to see PCP.   Orders Placed This Encounter  Procedures  . BMP8+EGFR  . Lipid panel    Standing Status: Future     Number of Occurrences: 1     Standing Expiration Date: 07/05/2013  . Hepatic function panel    Standing Status: Future     Number of Occurrences: 1     Standing Expiration Date: 07/05/2014  . POCT CBC      Henrene Pastor, PharmD, CPP

## 2013-06-05 ENCOUNTER — Telehealth: Payer: Self-pay | Admitting: Pharmacist

## 2013-06-05 ENCOUNTER — Encounter: Payer: Self-pay | Admitting: Pharmacist

## 2013-06-05 LAB — HEPATIC FUNCTION PANEL
AST: 107 IU/L — ABNORMAL HIGH (ref 0–40)
Albumin: 3.9 g/dL (ref 3.5–4.8)
Alkaline Phosphatase: 153 IU/L — ABNORMAL HIGH (ref 39–117)
Bilirubin, Direct: 0.11 mg/dL (ref 0.00–0.40)
Total Protein: 7.1 g/dL (ref 6.0–8.5)

## 2013-06-05 LAB — BMP8+EGFR
BUN/Creatinine Ratio: 14 (ref 11–26)
BUN: 20 mg/dL (ref 8–27)
Creatinine, Ser: 1.4 mg/dL — ABNORMAL HIGH (ref 0.57–1.00)
GFR calc Af Amer: 42 mL/min/{1.73_m2} — ABNORMAL LOW (ref >59)
Sodium: 142 mmol/L (ref 134–144)

## 2013-06-05 LAB — LIPID PANEL
Cholesterol, Total: 262 mg/dL — ABNORMAL HIGH (ref 100–199)
VLDL Cholesterol Cal: 75 mg/dL — ABNORMAL HIGH (ref 5–40)

## 2013-06-05 NOTE — Progress Notes (Signed)
Pt aware of CBC result notes

## 2013-06-05 NOTE — Addendum Note (Signed)
Addended by: Orma Render F on: 06/05/2013 12:07 PM   Modules accepted: Orders

## 2013-06-05 NOTE — Telephone Encounter (Signed)
Tried to notified patient of lab results from 06/04/13 - no answer.  Serum creatinine is stable

## 2013-06-06 NOTE — Telephone Encounter (Signed)
Patient's husband notified for lab results.

## 2013-06-11 ENCOUNTER — Ambulatory Visit: Payer: Self-pay

## 2013-06-25 ENCOUNTER — Other Ambulatory Visit: Payer: Self-pay | Admitting: Family Medicine

## 2013-06-26 NOTE — Telephone Encounter (Signed)
Last thyroid level 04/18/12 [facility]

## 2013-07-03 ENCOUNTER — Other Ambulatory Visit: Payer: Self-pay | Admitting: Family Medicine

## 2013-07-03 ENCOUNTER — Other Ambulatory Visit: Payer: Self-pay

## 2013-07-25 ENCOUNTER — Other Ambulatory Visit: Payer: Self-pay | Admitting: Family Medicine

## 2013-07-25 ENCOUNTER — Ambulatory Visit (INDEPENDENT_AMBULATORY_CARE_PROVIDER_SITE_OTHER): Payer: PRIVATE HEALTH INSURANCE | Admitting: General Practice

## 2013-07-25 ENCOUNTER — Telehealth: Payer: Self-pay | Admitting: Family Medicine

## 2013-07-25 ENCOUNTER — Encounter: Payer: Self-pay | Admitting: General Practice

## 2013-07-25 VITALS — BP 186/77 | HR 68 | Temp 97.5°F | Ht 61.0 in | Wt 189.5 lb

## 2013-07-25 DIAGNOSIS — K1120 Sialoadenitis, unspecified: Secondary | ICD-10-CM

## 2013-07-25 NOTE — Progress Notes (Signed)
  Subjective:    Patient ID: [name], female    DOB: 12-21-1935, 77 y.o.   MRN: [id number]  HPI Patient presents today with complaints of tenderness just below left ear and cheek area. Reports she isn't feeling the pain or discomfort at this time. Reports sitting underneath hair dryer this morning and heat helped with the pain. She denies OTC medications being taking. Denies redness or drainage.     Review of Systems  Constitutional: Negative for fever and chills.  HENT: Negative for ear pain, congestion, sneezing, neck pain, neck stiffness and postnasal drip.        Tenderness to area just below left ear and cheek area  Respiratory: Negative for chest tightness and shortness of breath.   Cardiovascular: Negative for chest pain and palpitations.  Neurological: Negative for dizziness, weakness and headaches.       Objective:   Physical Exam  Constitutional: She is oriented to person, place, and time. She appears well-developed and well-nourished.  HENT:  Head: Normocephalic and atraumatic.  Right Ear: External ear normal.  Left Ear: External ear normal.  Slight edema noted to parotid area  Cardiovascular: Normal rate, regular rhythm and normal heart sounds.   Pulmonary/Chest: Effort normal and breath sounds normal.  Neurological: She is alert and oriented to person, place, and time.  Skin: Skin is warm and dry.  Psychiatric: She has a normal mood and affect.          Assessment & Plan:  1. Parotitis -provided information sheet and discussed condition -patient to use sugar free sour candy to help with parotitis -may use warm compresses to affected area three times daily for 10-15 minutes for next three days -keep appointment with dentist as schedule -RTO if symptoms worsen or unresolved -Patient verbalized understanding -Coralie Keens, FNP-C

## 2013-07-25 NOTE — Telephone Encounter (Signed)
appt scheduled

## 2013-07-25 NOTE — Patient Instructions (Addendum)
Parotitis °Parotitis is soreness and swelling (inflammation) of one or both parotid glands. The parotid glands produce saliva. They are located on each side of the face, below and in front of the earlobes. The saliva produced comes out of tiny openings (ducts) inside the cheeks. In most cases, parotitis goes away over time or with treatment. If your parotitis is caused by certain long-term (chronic) diseases, it may come back again.  °CAUSES  °Parotitis can be caused by: °· Viral infections. Mumps is one viral infection that can cause parotitis. °· Bacterial infections. °· Blockage of the salivary ducts due to a salivary stone. °· Narrowing of the salivary ducts. °· Swelling of the salivary ducts. °· Dehydration. °· Autoimmune conditions, such as sarcoidosis or Sjogren's syndrome. °· Air from activities such as scuba diving, glass blowing, or playing an instrument (rare). °· Human immunodeficiency virus (HIV) or acquired immunodeficiency syndrome (AIDS). °· Tuberculosis. °SYMPTOMS  °· The ears may appear to be pushed up and out from their normal position. °· Redness (erythema) of the skin over the parotid glands. °· Pain and tenderness over the parotid glands. °· Swelling in the parotid gland area. °· Yellowish-white fluid (pus) coming from the ducts inside the cheeks. °· Dry mouth. °· Bad taste in the mouth. °DIAGNOSIS  °Your caregiver may determine that you have parotitis based on your symptoms and a physical exam. A sample of fluid may also be taken from the parotid gland and tested to find the cause of your infection. X-rays or computed tomography (CT) scans may be taken if your caregiver thinks you might have a salivary stone blocking your salivary duct. °TREATMENT  °Treatment varies depending upon the cause of your parotitis. If your parotitis is caused by mumps, no treatment is needed. The condition will go away on its own after 7 to 10 days. In other cases, treatment may include: °· Antibiotics if your  infection was caused by bacteria. °· Pain medicines. °· Gland massage. °· Eating sour candy to increase your saliva production. °· Removal of salivary stones. Your caregiver may flush stones out with fluids or remove them with tweezers. °· Surgery to remove the parotid glands. °HOME CARE INSTRUCTIONS  °· If you were given antibiotics, take them as directed. Finish them even if you start to feel better. °· Put warm compresses on the sore area. °· Only take over-the-counter or prescription medicines for pain, discomfort, or fever as directed by your caregiver. °· Drink enough fluids to keep your urine clear or pale yellow. °SEEK IMMEDIATE MEDICAL CARE IF:  °· You have increasing pain or swelling that is not controlled with medicine. °· You have a fever. °MAKE SURE YOU: °· Understand these instructions. °· Will watch your condition. °· Will get help right away if you are not doing well or get worse. °Document Released: 04/09/2002 Document Revised: 01/10/2012 Document Reviewed: 09/13/2011 °[employer]® Patient Information ©2014 [employer]. ° °

## 2013-07-30 ENCOUNTER — Other Ambulatory Visit: Payer: Self-pay | Admitting: Family Medicine

## 2013-08-01 ENCOUNTER — Other Ambulatory Visit: Payer: Self-pay | Admitting: Family Medicine

## 2013-08-06 ENCOUNTER — Ambulatory Visit (INDEPENDENT_AMBULATORY_CARE_PROVIDER_SITE_OTHER): Payer: PRIVATE HEALTH INSURANCE

## 2013-08-06 DIAGNOSIS — Z23 Encounter for immunization: Secondary | ICD-10-CM

## 2013-08-20 ENCOUNTER — Other Ambulatory Visit: Payer: Self-pay | Admitting: Family Medicine

## 2013-08-27 ENCOUNTER — Other Ambulatory Visit: Payer: Self-pay | Admitting: Family Medicine

## 2013-08-30 ENCOUNTER — Ambulatory Visit: Payer: PRIVATE HEALTH INSURANCE | Admitting: Family Medicine

## 2013-09-03 ENCOUNTER — Ambulatory Visit (INDEPENDENT_AMBULATORY_CARE_PROVIDER_SITE_OTHER): Payer: PRIVATE HEALTH INSURANCE | Admitting: Family Medicine

## 2013-09-03 ENCOUNTER — Encounter: Payer: Self-pay | Admitting: Family Medicine

## 2013-09-03 ENCOUNTER — Ambulatory Visit (INDEPENDENT_AMBULATORY_CARE_PROVIDER_SITE_OTHER): Payer: PRIVATE HEALTH INSURANCE

## 2013-09-03 VITALS — BP 160/58 | HR 70 | Temp 97.3°F | Ht 61.0 in | Wt 190.0 lb

## 2013-09-03 DIAGNOSIS — I10 Essential (primary) hypertension: Secondary | ICD-10-CM

## 2013-09-03 DIAGNOSIS — E119 Type 2 diabetes mellitus without complications: Secondary | ICD-10-CM

## 2013-09-03 DIAGNOSIS — K219 Gastro-esophageal reflux disease without esophagitis: Secondary | ICD-10-CM

## 2013-09-03 DIAGNOSIS — E785 Hyperlipidemia, unspecified: Secondary | ICD-10-CM

## 2013-09-03 DIAGNOSIS — E559 Vitamin D deficiency, unspecified: Secondary | ICD-10-CM

## 2013-09-03 DIAGNOSIS — R300 Dysuria: Secondary | ICD-10-CM

## 2013-09-03 DIAGNOSIS — N189 Chronic kidney disease, unspecified: Secondary | ICD-10-CM

## 2013-09-03 DIAGNOSIS — E0590 Thyrotoxicosis, unspecified without thyrotoxic crisis or storm: Secondary | ICD-10-CM

## 2013-09-03 LAB — POCT CBC
Granulocyte percent: 73.7 %G (ref 37–80)
HCT, POC: 40.6 % (ref 37.7–47.9)
Hemoglobin: 13.1 g/dL (ref 12.2–16.2)
Lymph, poc: 1.8 (ref 0.6–3.4)
MCH, POC: 29.7 pg (ref 27–31.2)
MCHC: 32.2 g/dL (ref 31.8–35.4)
MPV: 8.7 fL (ref 0–99.8)
POC LYMPH PERCENT: 22 %L (ref 10–50)
Platelet Count, POC: 219 10*3/uL (ref 142–424)
RBC: 4.4 M/uL (ref 4.04–5.48)

## 2013-09-03 LAB — POCT UA - MICROSCOPIC ONLY
Bacteria, U Microscopic: NEGATIVE
Casts, Ur, LPF, POC: NEGATIVE
Crystals, Ur, HPF, POC: NEGATIVE
Yeast, UA: NEGATIVE

## 2013-09-03 LAB — POCT URINALYSIS DIPSTICK
Bilirubin, UA: NEGATIVE
Leukocytes, UA: NEGATIVE
Nitrite, UA: NEGATIVE
Spec Grav, UA: >=1.03
Urobilinogen, UA: NEGATIVE

## 2013-09-03 LAB — POCT GLYCOSYLATED HEMOGLOBIN (HGB A1C): Hemoglobin A1C: 7.1

## 2013-09-03 NOTE — Addendum Note (Signed)
Addended by: Prescott Gum on: 09/03/2013 10:55 AM   Modules accepted: Orders

## 2013-09-03 NOTE — Progress Notes (Signed)
Subjective:    Patient ID: [name], female    DOB: 1936-05-19, 77 y.o.   MRN: [id number]  HPI Pt here for follow up and management of chronic medical problems. Patient comes in today with her husband for followup and maintenance of chronic health problems. Patient is alert and cooperative.      Patient Active Problem List   Diagnosis Date Noted  . Vitamin D insufficiency 03/29/2013  . Chronic kidney disease 09/14/2011  . CVA (cerebral infarction), past history 09/14/2011  . 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 DIABETES MELLITUS, TYPE II 08/12/2008  . HYPERLIPIDEMIA 08/12/2008  . HYPERTENSION 08/12/2008  . GERD 08/12/2008  . OSTEOARTHRITIS, lumbar spine 08/12/2008  . OSTEOPOROSIS 08/12/2008  . KNEE REPLACEMENT, RIGHT, HX OF 08/12/2008   Outpatient Encounter Prescriptions as of 09/03/2013  Medication Sig  . acetaminophen (TYLENOL) 500 MG tablet Take 1,000 mg by mouth every 6 (six) hours as needed.   Marland Kitchen amLODipine (NORVASC) 10 MG tablet TAKE 1 TABLET ONCE A DAY  . aspirin 325 MG EC tablet Take 325 mg by mouth daily.    . B Complex-C-Folic Acid (MULTIVITAMIN, STRESS FORMULA) tablet Take 1 tablet by mouth daily.    . Cholecalciferol (VITAMIN D3) 1000 UNITS CAPS Take 1 capsule by mouth daily.    Marland Kitchen denosumab (PROLIA) 60 MG/ML SOLN injection Inject 60 mg into the skin every 6 (six) months. Administer in upper arm, thigh, or abdomen  . guaiFENesin (MUCINEX) 600 MG 12 hr tablet Take 1,200 mg by mouth 2 (two) times daily.  . insulin lispro (HUMALOG) 100 UNIT/ML injection Inject into the skin See admin instructions. Inject per sliding scale prior to lunch daily  . insulin lispro protamine-lispro (HUMALOG 50/50) (50-50) 100 UNIT/ML SUSP injection 58 units each morning and 38 units each evening  . Insulin Pen Needle (PEN NEEDLES) 31G X 6 MM MISC USE AS DIRECTED  . Lancets (ONETOUCH ULTRASOFT) lancets   . levothyroxine (SYNTHROID, LEVOTHROID) 25 MCG tablet TAKE 1 TABLET  DAILY MONDAY THRU FRIDAY, AND 2 TABLETS ON SATURDAY ANDSUNDAY  . LOVAZA 1 G capsule TAKE (2) CAPSULES TWICE DAILY.  . metoprolol tartrate (LOPRESSOR) 25 MG tablet TAKE  (1)  TABLET TWICE A DAY.  . Multiple Vitamins-Minerals (SENTRY SENIOR) TABS Take 1 tablet by mouth daily.  Marland Kitchen omeprazole (PRILOSEC) 20 MG capsule TAKE  (1)  TABLET TWICE A DAY.  . ONE TOUCH ULTRA TEST test strip CHECK BLOOD SUGAR UP TO 3 TIMES A DAY AS DIRECTED  . [DISCONTINUED] Pitavastatin Calcium 4 MG TABS Take 1 tablet (4 mg total) by mouth every evening.  . [DISCONTINUED] clindamycin (CLEOCIN) 300 MG capsule Take 300 mg by mouth See admin instructions. Take 2 capsules by mouth 1 hour prior to dental procedure  . [DISCONTINUED] Insulin Lispro Prot & Lispro (HUMALOG MIX 50/50 KWIKPEN) (50-50) 100 UNIT/ML SUPN Inject 48 Units into the skin 2 (two) times daily. inject 58 units SQ every AM, and 38 units every evening - this is the correct directions    Review of Systems  Constitutional: Negative.   HENT: Negative.   Eyes: Negative.   Respiratory: Negative.   Cardiovascular: Negative.   Gastrointestinal: Negative.   Endocrine: Negative.   Genitourinary: Negative.   Musculoskeletal: Negative.   Skin: Negative.        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 Ethelene Hal  Allergic/Immunologic: Negative.   Neurological: Negative.   Hematological: Negative.   Psychiatric/Behavioral: Negative.  Objective:   Physical Exam  Nursing note and vitals reviewed. Constitutional: She is oriented to person, place, and time. She appears well-developed and well-nourished.  For her age  HENT:  Head: Normocephalic and atraumatic.  Right Ear: External ear normal.  Left Ear: External ear normal.  Mouth/Throat: Oropharynx is clear and moist. No oropharyngeal exudate.  Nasal congestion bilateral  Eyes: Conjunctivae and EOM are normal. Pupils are equal, round, and reactive to light. Right eye exhibits no  discharge. Left eye exhibits no discharge. No scleral icterus.  Neck: Normal range of motion. Neck supple. No JVD present. No thyromegaly present.  Cardiovascular: Normal rate, regular rhythm, normal heart sounds and intact distal pulses.  Exam reveals no gallop and no friction rub.   No murmur heard. At 72 per minute  Pulmonary/Chest: Effort normal and breath sounds normal. No respiratory distress. She has no wheezes. She has no rales. She exhibits no tenderness.  Abdominal: Soft. Bowel sounds are normal. She exhibits no mass. There is tenderness. There is no rebound and no guarding.  There is slight tenderness. She has a colostomy bag in place.  Genitourinary:  Breast exam today revealed no lumps or masses or axillary nodes.  Musculoskeletal: Normal range of motion. She exhibits no edema and no tenderness.  Lymphadenopathy:    She has no cervical adenopathy.  Neurological: She is alert and oriented to person, place, and time. No cranial nerve deficit.  Skin: Skin is warm and dry.  Psychiatric: She has a normal mood and affect. Her behavior is normal. Judgment and thought content normal.   BP 160/58  Pulse 70  Temp(Src) 97.3 F (36.3 C) (Oral)  Ht 5\' 1"  (1.549 m)  Wt 190 lb (86.183 kg)  BMI 35.92 kg/m2        Assessment & Plan:   1. DIABETES MELLITUS, TYPE II   2. Chronic kidney disease   3. GERD   4. HYPERLIPIDEMIA   5. HYPERTENSION   6. Vitamin D insufficiency   7. Hyperthyroidism   8. Dysuria    Orders Placed This Encounter  Procedures  . DG Chest 2 View    Standing Status: Future     Number of Occurrences:      Standing Expiration Date: 11/03/2014    Order Specific Question:  Reason for Exam (SYMPTOM  OR DIAGNOSIS REQUIRED)    Answer:  htn    Order Specific Question:  Preferred imaging location?    Answer:  Internal  . Hepatic function panel  . BMP8+EGFR  . NMR, lipoprofile  . Thyroid Panel With TSH  . Vit D  25 hydroxy (rtn osteoporosis monitoring)  . POCT  CBC  . POCT glycosylated hemoglobin (Hb A1C)  . POCT UA - Microscopic Only  . POCT urinalysis dipstick   No orders of the defined types were placed in this encounter.   Patient Instructions  Continue current medications. Continue good therapeutic lifestyle changes.  Fall precautions discussed with patient. Follow up as planned and earlier as needed.  Use nasal saline and home frequently Drink plenty of fluids Use a cool mist humidifier during the winter months to provide more moisture for the environment   Continue to monitor blood sugars and blood pressures. Return to clinic in 10 days to get Prevnar  Shot  Nyra Capes MD

## 2013-09-03 NOTE — Patient Instructions (Addendum)
Continue current medications. Continue good therapeutic lifestyle changes.  Fall precautions discussed with patient. Follow up as planned and earlier as needed.  Use nasal saline and home frequently Drink plenty of fluids Use a cool mist humidifier during the winter months to provide more moisture for the environment

## 2013-09-05 LAB — HEPATIC FUNCTION PANEL
ALT: 56 IU/L — ABNORMAL HIGH (ref 0–32)
AST: 75 IU/L — ABNORMAL HIGH (ref 0–40)
Albumin: 3.6 g/dL (ref 3.5–4.8)
Bilirubin, Direct: 0.12 mg/dL (ref 0.00–0.40)
Total Bilirubin: 0.3 mg/dL (ref 0.0–1.2)
Total Protein: 6.9 g/dL (ref 6.0–8.5)

## 2013-09-05 LAB — NMR, LIPOPROFILE
HDL Cholesterol by NMR: 59 mg/dL (ref >=40)
HDL Particle Number: 39.2 umol/L (ref >=30.5)
LDL Size: 22.2 nm (ref >20.5)
LP-IR Score: 48 — ABNORMAL HIGH (ref <=45)
Small LDL Particle Number: <90 nmol/L (ref <=527)
Triglycerides by NMR: 394 mg/dL — ABNORMAL HIGH (ref <150)

## 2013-09-05 LAB — BMP8+EGFR
BUN/Creatinine Ratio: 12 (ref 11–26)
BUN: 16 mg/dL (ref 8–27)
Calcium: 9.5 mg/dL (ref 8.6–10.2)
Creatinine, Ser: 1.31 mg/dL — ABNORMAL HIGH (ref 0.57–1.00)
GFR calc non Af Amer: 40 mL/min/{1.73_m2} — ABNORMAL LOW (ref >59)
Glucose: 197 mg/dL — ABNORMAL HIGH (ref 65–99)
Potassium: 4.9 mmol/L (ref 3.5–5.2)
Sodium: 139 mmol/L (ref 134–144)

## 2013-09-05 LAB — URINE CULTURE

## 2013-09-05 LAB — THYROID PANEL WITH TSH: T4, Total: 6.7 ug/dL (ref 4.5–12.0)

## 2013-09-12 ENCOUNTER — Ambulatory Visit (INDEPENDENT_AMBULATORY_CARE_PROVIDER_SITE_OTHER): Payer: PRIVATE HEALTH INSURANCE | Admitting: *Deleted

## 2013-09-12 ENCOUNTER — Other Ambulatory Visit: Payer: PRIVATE HEALTH INSURANCE

## 2013-09-12 DIAGNOSIS — R300 Dysuria: Secondary | ICD-10-CM

## 2013-09-12 DIAGNOSIS — N390 Urinary tract infection, site not specified: Secondary | ICD-10-CM

## 2013-09-12 DIAGNOSIS — Z23 Encounter for immunization: Secondary | ICD-10-CM

## 2013-09-12 LAB — POCT UA - MICROSCOPIC ONLY: Crystals, Ur, HPF, POC: NEGATIVE

## 2013-09-12 LAB — POCT URINALYSIS DIPSTICK
Bilirubin, UA: NEGATIVE
Glucose, UA: NEGATIVE
Ketones, UA: NEGATIVE
Leukocytes, UA: NEGATIVE
pH, UA: 5

## 2013-09-12 NOTE — Patient Instructions (Signed)

## 2013-09-12 NOTE — Addendum Note (Signed)
Addended by: Magdalene River on: 09/12/2013 08:59 AM   Modules accepted: Orders

## 2013-09-12 NOTE — Addendum Note (Signed)
Addended by: Lisbeth Ply C on: 09/12/2013 10:01 AM   Modules accepted: Orders

## 2013-09-12 NOTE — Addendum Note (Signed)
Addended by: Magdalene River on: 09/12/2013 09:02 AM   Modules accepted: Orders

## 2013-09-13 ENCOUNTER — Other Ambulatory Visit (INDEPENDENT_AMBULATORY_CARE_PROVIDER_SITE_OTHER): Payer: PRIVATE HEALTH INSURANCE

## 2013-09-13 DIAGNOSIS — N390 Urinary tract infection, site not specified: Secondary | ICD-10-CM

## 2013-09-13 LAB — POCT UA - MICROSCOPIC ONLY: Crystals, Ur, HPF, POC: NEGATIVE

## 2013-09-13 LAB — POCT URINALYSIS DIPSTICK
Bilirubin, UA: NEGATIVE
Glucose, UA: 250
Ketones, UA: NEGATIVE
Leukocytes, UA: NEGATIVE
Nitrite, UA: NEGATIVE
Spec Grav, UA: 1.025
Urobilinogen, UA: NEGATIVE
pH, UA: 5

## 2013-09-18 ENCOUNTER — Other Ambulatory Visit (INDEPENDENT_AMBULATORY_CARE_PROVIDER_SITE_OTHER): Payer: PRIVATE HEALTH INSURANCE

## 2013-09-18 DIAGNOSIS — Z1212 Encounter for screening for malignant neoplasm of rectum: Secondary | ICD-10-CM

## 2013-09-19 ENCOUNTER — Other Ambulatory Visit: Payer: Self-pay | Admitting: Family Medicine

## 2013-09-20 LAB — FECAL OCCULT BLOOD, IMMUNOCHEMICAL: Fecal Occult Bld: POSITIVE — AB

## 2013-10-01 ENCOUNTER — Other Ambulatory Visit: Payer: Self-pay | Admitting: Family Medicine

## 2013-10-04 ENCOUNTER — Ambulatory Visit (INDEPENDENT_AMBULATORY_CARE_PROVIDER_SITE_OTHER): Payer: PRIVATE HEALTH INSURANCE | Admitting: Pharmacist

## 2013-10-04 ENCOUNTER — Encounter: Payer: Self-pay | Admitting: Family Medicine

## 2013-10-04 ENCOUNTER — Other Ambulatory Visit: Payer: Self-pay

## 2013-10-04 ENCOUNTER — Ambulatory Visit (INDEPENDENT_AMBULATORY_CARE_PROVIDER_SITE_OTHER): Payer: PRIVATE HEALTH INSURANCE | Admitting: Family Medicine

## 2013-10-04 VITALS — BP 153/77 | HR 63 | Temp 97.8°F | Ht 61.0 in | Wt 192.0 lb

## 2013-10-04 DIAGNOSIS — R1011 Right upper quadrant pain: Secondary | ICD-10-CM

## 2013-10-04 DIAGNOSIS — E119 Type 2 diabetes mellitus without complications: Secondary | ICD-10-CM

## 2013-10-04 DIAGNOSIS — G8929 Other chronic pain: Secondary | ICD-10-CM

## 2013-10-04 LAB — POCT CBC
Granulocyte percent: 65.5 %G (ref 37–80)
HCT, POC: 44.1 % (ref 37.7–47.9)
Hemoglobin: 13.5 g/dL (ref 12.2–16.2)
Lymph, poc: 2.7 (ref 0.6–3.4)
MCH, POC: 28.8 pg (ref 27–31.2)
MCHC: 30.7 g/dL — AB (ref 31.8–35.4)
MCV: 93.6 fL (ref 80–97)
MPV: 8.9 fL (ref 0–99.8)
POC Granulocyte: 6.2 (ref 2–6.9)
POC LYMPH PERCENT: 28.4 %L (ref 10–50)
Platelet Count, POC: 246 10*3/uL (ref 142–424)
RBC: 4.7 M/uL (ref 4.04–5.48)
RDW, POC: 14.7 %
WBC: 9.5 10*3/uL (ref 4.6–10.2)

## 2013-10-04 MED ORDER — ONDANSETRON HCL 8 MG PO TABS: 8.0000 mg | ORAL_TABLET | Freq: Three times a day (TID) | ORAL | Status: DC | PRN

## 2013-10-04 NOTE — Progress Notes (Signed)
   Subjective:    Patient ID: [name], female    DOB: [birth year], 77 y.o.   MRN: [id number]  HPI This 77 y.o. female presents for evaluation of right upper quadrant abdominal pain and nausea. She is feeling sick after eating.   Review of Systems C/o abdominal pain No chest pain, SOB, HA, dizziness, vision change, N/V, diarrhea, constipation, dysuria, urinary urgency or frequency, myalgias, arthralgias or rash.     Objective:   Physical Exam  Vital signs noted  Well developed well nourished female.  HEENT - Head atraumatic Normocephalic                Eyes - PERRLA, Conjuctiva - clear Sclera- Clear EOMI                Ears - EAC's Wnl TM's Wnl Gross Hearing WNL                Nose - Nares patent                 Throat - oropharanx wnl Respiratory - Lungs CTA bilateral Cardiac - RRR S1 and S2 without murmur GI - Abdomen tender RUQ with murphy's sign. Extremities - No edema. Neuro - Grossly intact.      Assessment & Plan:  Abdominal pain, right upper quadrant - Plan: ondansetron (ZOFRAN) 8 MG tablet, US Abdomen Limited, POCT CBC, Sedimentation rate, CMP14+EGFR  Will get [name] of abdomen ASAP and have her go to ED if positive for GB dz.  Deatra Canter FNP

## 2013-10-04 NOTE — Patient Instructions (Signed)

## 2013-10-04 NOTE — Patient Instructions (Signed)
Continue Humalog 50/50 36 units each evening but increase morning dose to 60 units for 2 days, then to 62 units thereafter.

## 2013-10-04 NOTE — Progress Notes (Signed)
Diabetes Follow-Up Visit Chief Complaint:   No chief complaint on file.    There were no vitals filed for this visit. There were no vitals filed for this visit.  HPI: patient with difficult to control diabetes since metformin was discontinued secondary to elevated Scr.  She was seen by Dr Christell Constant in the beginning of November and A1C has improved from 8.0% in May 2014 to 7.1% in Novmeber 2014. She brings in HBG readings that show slightly high readings prior to lunch and in the evening.     Current Diabetes Medications:  Humalog 50/50 insulin 58 units qam and 36 units qpm (patient was prescribed 38 units in pm but she decreased recently because had 2 hypoglycemia events during sleep)        Humalog at lunch per sliding scale      71-120     5 units      121-175   7 units      176 - 200   8 units      201 -  225  10 units      226 - 250    11 units      251 or above  12 units    Patient also c/o abdominal pain with nausea and chills off and on for the last 2 months. Last episode was 3 days ago.  She wonders if it is her gallbadder.  Home BG Monitoring:  Checking  2-3 times a day. Average:  142  High: 360  Low:  71  A1c was 7.1% (09/2013) Ac1 was 7.4% (05/30/2013) A1C was 8.0%  05.2014 and  8.5% on 01/02/2013   Assessment: 1.  Diabetes.  Uncontrolled but continues to improve - some concern about occ. hypogycemia 2. Abdominal pain   Recommendations: 1.  Medication recommendations at this time are as follows:    Change Humalog 50/50 to 62 units qam and 36 units qpm    Call office if experience hypoglycemia/low blood glucose   Humalog continue to use sliding scale at lunch   71-120     5 units   121-175   7 units   176 - 200   8 units   201 -  225  10 units   226 - 250    11 units   251 or above  12 units   2. Triaged to Ander Slade, NP to work up abdominal pain    Henrene Pastor, PharmD, CPP

## 2013-10-05 ENCOUNTER — Other Ambulatory Visit: Payer: Self-pay | Admitting: Family Medicine

## 2013-10-05 ENCOUNTER — Ambulatory Visit
Admission: RE | Admit: 2013-10-05 | Discharge: 2013-10-05 | Disposition: A | Discharge status: Home / Self Care | Payer: Medicare Other | Source: Ambulatory Visit | Attending: Family Medicine | Admitting: Family Medicine

## 2013-10-05 DIAGNOSIS — G8929 Other chronic pain: Secondary | ICD-10-CM

## 2013-10-05 DIAGNOSIS — R1011 Right upper quadrant pain: Secondary | ICD-10-CM

## 2013-10-05 LAB — CMP14+EGFR
ALT: 66 IU/L — ABNORMAL HIGH (ref 0–32)
AST: 97 IU/L — ABNORMAL HIGH (ref 0–40)
Albumin/Globulin Ratio: 1.2 (ref 1.1–2.5)
Albumin: 3.9 g/dL (ref 3.5–4.8)
Alkaline Phosphatase: 150 IU/L — ABNORMAL HIGH (ref 39–117)
BUN/Creatinine Ratio: 11 (ref 11–26)
BUN: 17 mg/dL (ref 8–27)
CO2: 20 mmol/L (ref 18–29)
Calcium: 9.4 mg/dL (ref 8.6–10.2)
Chloride: 102 mmol/L (ref 97–108)
Creatinine, Ser: 1.49 mg/dL — ABNORMAL HIGH (ref 0.57–1.00)
GFR calc Af Amer: 39 mL/min/{1.73_m2} — ABNORMAL LOW (ref >59)
GFR calc non Af Amer: 34 mL/min/{1.73_m2} — ABNORMAL LOW (ref >59)
Globulin, Total: 3.3 g/dL (ref 1.5–4.5)
Glucose: 104 mg/dL — ABNORMAL HIGH (ref 65–99)
Potassium: 4.8 mmol/L (ref 3.5–5.2)
Sodium: 140 mmol/L (ref 134–144)
Total Bilirubin: 0.2 mg/dL (ref 0.0–1.2)
Total Protein: 7.2 g/dL (ref 6.0–8.5)

## 2013-10-05 LAB — SEDIMENTATION RATE: Sed Rate: 38 mm/hr (ref 0–40)

## 2013-10-11 ENCOUNTER — Other Ambulatory Visit (INDEPENDENT_AMBULATORY_CARE_PROVIDER_SITE_OTHER): Payer: PRIVATE HEALTH INSURANCE

## 2013-10-11 DIAGNOSIS — Z1212 Encounter for screening for malignant neoplasm of rectum: Secondary | ICD-10-CM

## 2013-10-11 NOTE — Progress Notes (Signed)
Pt dropped off FOBT only 

## 2013-10-12 LAB — FECAL OCCULT BLOOD, IMMUNOCHEMICAL: Fecal Occult Bld: NEGATIVE

## 2013-10-16 ENCOUNTER — Other Ambulatory Visit: Payer: Self-pay | Admitting: Family Medicine

## 2013-10-16 DIAGNOSIS — R1011 Right upper quadrant pain: Secondary | ICD-10-CM

## 2013-10-22 ENCOUNTER — Telehealth: Payer: Self-pay | Admitting: *Deleted

## 2013-10-22 NOTE — Telephone Encounter (Signed)
Pt notified and per Carlon Hida scan was denied by insurance and Annette Stable put in referral to Dr. Jarold Motto. Pt aware and will await appt.

## 2013-10-22 NOTE — Telephone Encounter (Signed)
Message copied by Baltazar Apo on Mon Oct 22, 2013  3:45 PM ------      Message from: Deatra Canter      Created: Fri Oct 05, 2013 12:51 PM       GB US is negative, Hida Scan ordered. ------

## 2013-10-22 NOTE — Telephone Encounter (Signed)
Message copied by Baltazar Apo on Mon Oct 22, 2013  3:44 PM ------      Message from: Deatra Canter      Created: Fri Oct 05, 2013 12:48 PM       Transaminases elevated and alk phos elevated.  Await results of GB US.  Concerned about biliary colic.  CKD stable ------

## 2013-10-22 NOTE — Telephone Encounter (Signed)
Pt notified and verbalized understanding.

## 2013-10-30 ENCOUNTER — Encounter: Payer: Self-pay | Admitting: *Deleted

## 2013-10-30 ENCOUNTER — Other Ambulatory Visit: Payer: Self-pay | Admitting: Family Medicine

## 2013-11-02 ENCOUNTER — Telehealth: Payer: Self-pay | Admitting: Family Medicine

## 2013-11-06 ENCOUNTER — Other Ambulatory Visit (INDEPENDENT_AMBULATORY_CARE_PROVIDER_SITE_OTHER): Payer: Medicare Other

## 2013-11-06 ENCOUNTER — Ambulatory Visit (INDEPENDENT_AMBULATORY_CARE_PROVIDER_SITE_OTHER): Payer: Medicare Other | Admitting: Gastroenterology

## 2013-11-06 ENCOUNTER — Encounter: Payer: Self-pay | Admitting: Gastroenterology

## 2013-11-06 VITALS — BP 180/62 | HR 79 | Ht 60.5 in | Wt 192.5 lb

## 2013-11-06 DIAGNOSIS — R110 Nausea: Secondary | ICD-10-CM

## 2013-11-06 DIAGNOSIS — R1011 Right upper quadrant pain: Secondary | ICD-10-CM

## 2013-11-06 DIAGNOSIS — R109 Unspecified abdominal pain: Secondary | ICD-10-CM

## 2013-11-06 DIAGNOSIS — G8929 Other chronic pain: Secondary | ICD-10-CM

## 2013-11-06 DIAGNOSIS — E669 Obesity, unspecified: Secondary | ICD-10-CM

## 2013-11-06 DIAGNOSIS — Z932 Ileostomy status: Secondary | ICD-10-CM

## 2013-11-06 DIAGNOSIS — E119 Type 2 diabetes mellitus without complications: Secondary | ICD-10-CM

## 2013-11-06 DIAGNOSIS — K219 Gastro-esophageal reflux disease without esophagitis: Secondary | ICD-10-CM

## 2013-11-06 LAB — HEPATIC FUNCTION PANEL
ALBUMIN: 3.4 g/dL — AB (ref 3.5–5.2)
ALT: 77 U/L — AB (ref 0–35)
AST: 93 U/L — ABNORMAL HIGH (ref 0–37)
Alkaline Phosphatase: 175 U/L — ABNORMAL HIGH (ref 39–117)
BILIRUBIN DIRECT: 0.1 mg/dL (ref 0.0–0.3)
TOTAL PROTEIN: 7.8 g/dL (ref 6.0–8.3)
Total Bilirubin: 0.5 mg/dL (ref 0.3–1.2)

## 2013-11-06 MED ORDER — TRAMADOL HCL 50 MG PO TABS: 50.0000 mg | ORAL_TABLET | Freq: Four times a day (QID) | ORAL | Status: DC | PRN

## 2013-11-06 NOTE — Progress Notes (Signed)
History of Present Illness:  This is a very complicated 78 year old Caucasian female status post removal of most of her colon because of diverticulitis with resultant ileostomy and parastomal ileostomy hernia.  The time of her surgery she apparently developed MRSA had kidney failure and had dialysis.  She denies lower GI complaints this time but has had 3 months right upper quadrant constant dull aching discomfort without alleviating or precipitating elements.  She's tried PPI as, antacids, and Zofran without relief.  She's had no reflux symptoms, dysphagia, dyspepsia, change in bowel habits, fever, chills, or any hepatobiliary complaints.  Ultrasound the gallbladder was unremarkable.  She has suffered from chronic obesity.  She also has compression fracture in T12, and has coronary artery calcifications.  Review of her labs shows mildly abnormal liver function tests with AST 67 ALT 66.  She's had previous CVA, as well as her diabetes, degenerative arthritis, CHF, chronic renal insufficiency.  She also has had multiple orthopedic surgical procedures.  Negative colonoscopy in 2005  several diverticulosis. l  I have reviewed this patient's present history, medical and surgical past history, allergies and medications.   Allergies  Allergen Reactions  . Ace Inhibitors   . Actos [Pioglitazone Hydrochloride]   . Aspirin     REACTION: GI upset  . Codeine     REACTION: hallucinations  . Hydromorphone Hcl   . Metformin And Related   . Penicillins     REACTION: "crazy feeling"  . Procaine Hcl   . Statins Other (See Comments)    myalgias  . Sulfa Antibiotics    Outpatient Prescriptions Prior to Visit  Medication Sig Dispense Refill  . acetaminophen (TYLENOL) 500 MG tablet Take 1,000 mg by mouth every 6 (six) hours as needed.       Marland Kitchen amLODipine (NORVASC) 10 MG tablet TAKE 1 TABLET ONCE A DAY  90 tablet  0  . aspirin 325 MG EC tablet Take 325 mg by mouth daily.        . B Complex-C-Folic Acid  (MULTIVITAMIN, STRESS FORMULA) tablet Take 1 tablet by mouth daily.        Marland Kitchen denosumab (PROLIA) 60 MG/ML SOLN injection Inject 60 mg into the skin every 6 (six) months. Administer in upper arm, thigh, or abdomen      . guaiFENesin (MUCINEX) 600 MG 12 hr tablet Take 1,200 mg by mouth 2 (two) times daily.      . insulin lispro (HUMALOG KWIKPEN) 100 UNIT/ML SOPN Inject up to 12 units before lunch as directed based on BG  15 mL  0  . insulin lispro protamine-lispro (HUMALOG 50/50) (50-50) 100 UNIT/ML SUSP injection 62 units each morning and 36 units each evening  10 mL    . Insulin Pen Needle (PEN NEEDLES) 31G X 6 MM MISC USE AS DIRECTED  100 each  1  . Lancets (ONETOUCH ULTRASOFT) lancets CHECK BLOOD SUGAR 4 TIMES A DAY  100 each  1  . levothyroxine (SYNTHROID, LEVOTHROID) 25 MCG tablet TAKE 1 TABLET DAILY MONDAY THRU FRIDAY, AND 2 TABLETS ON SATURDAY ANDSUNDAY  40 tablet  2  . loratadine (CLARITIN) 10 MG tablet Take 10 mg by mouth daily as needed for allergies.      . metoprolol tartrate (LOPRESSOR) 25 MG tablet TAKE  (1)  TABLET TWICE A DAY.  60 tablet  5  . Multiple Vitamins-Minerals (SENTRY SENIOR) TABS Take 1 tablet by mouth daily.      Marland Kitchen omega-3 acid ethyl esters (LOVAZA) 1 G capsule TAKE (  2) CAPSULES TWICE DAILY.  120 capsule  4  . omeprazole (PRILOSEC) 20 MG capsule TAKE  (1)  TABLET TWICE A DAY.  60 capsule  4  . ondansetron (ZOFRAN) 8 MG tablet Take 1 tablet (8 mg total) by mouth every 8 (eight) hours as needed for nausea or vomiting.  20 tablet  1  . ONE TOUCH ULTRA TEST test strip CHECK BLOOD SUGAR UP TO 3 TIMES A DAY AS DIRECTED  100 each  1  . Vitamin D, Ergocalciferol, (DRISDOL) 50000 UNITS CAPS capsule Take 50,000 Units by mouth every 7 (seven) days.       No facility-administered medications prior to visit.   Past Medical History  Diagnosis Date  . Diverticulitis 2005    Colonoscopy--Dr. Sharlett Iles   . Diabetes mellitus without complication   . Hyperlipidemia   . Hypertension    . Stroke     Past history  . Hepatic steatosis   . Anemia   . Arthritis   . Chronic kidney disease (CKD)     past dialysis  . CHF (congestive heart failure)   . Status post dilation of esophageal narrowing   . MRSA infection 07/2009   Past Surgical History  Procedure Laterality Date  . Bilateral oophorectomy    . Rotator cuff repair Bilateral   . Carpal tunnel repair Bilateral   . Laparoscopic colostomy      for diverticulitis  . Nasal sinus surgery    . Tonsillectomy    . Replacement total knee Right   . Abdominal hernia repair      with ilestomy   History   Social History  . Marital Status: Married    Spouse Name: N/A    Number of Children: 1  . Years of Education: N/A   Occupational History  . retired    Social History Main Topics  . Smoking status: Never Smoker   . Smokeless tobacco: Never Used  . Alcohol Use: No  . Drug Use: No  . Sexual Activity: None   Other Topics Concern  . None   Social History Narrative  . None   Family History  Problem Relation Age of Onset  . Diabetes Mother   . Heart disease Father   . Diabetes Sister        ROS:   All systems were reviewed and are negative unless otherwise stated in the HPI.  Plans of chronic fatigue, back pain, shortness of breath, night sweats, hearing difficulties.    Physical Exam: Blood pressure 180/62 pulse 79 and regular weight 192 the BMI of 36.96. General well developed well nourished patient in no acute distress, appearing their stated age Eyes PERRLA, no icterus, fundoscopic exam per opthamologist Skin no lesions noted Neck supple, no adenopathy, no thyroid enlargement, no tenderness Chest clear to percussion and auscultation Heart no significant murmurs, gallops or rubs noted Abdomen  ileostomy bag in left lower quadrant area with large peri-stomal hernia.  There is a large fat apron in the lower abdomen area her right upper quadrant shows enlarged liver which is nonnodular nontender but  is tender to deep palpation.  There is no splenomegaly, ascites, or other specific abnormalities.  I cannot appreciate an abdominal bruit.  Her mental status is clear. Rectal inspection normal no fissures, or fistulae noted.  No masses or tenderness on digital exam. Stool guaiac negative. Extremities no acute joint lesions, edema, phlebitis or evidence of cellulitis. Neurologic patient oriented x 3, cranial nerves intact, no focal neurologic deficits  noted. Psychological mental status normal and normal affect.  Assessment and plan: Probable right upper quadrant pain from fatty liver associated with her obesity.  We will do CCK-HIDA scan to confirm gallbladder dysfunction if present.  I have given her some tramado 50 mg tol use on a when necessary basis for pain, have repeat her liver enzymes, and we'll refer her to dietary for weight loss reduction plan.  This patient is a poor operative candidate.  She has had multiple other gynecologic surgical procedures.  She is on daily omeprazole for her acid reflux symptoms.  Noted are multiple drug allergies.  I do not think endoscopy is indicated.

## 2013-11-06 NOTE — Patient Instructions (Addendum)
You have been scheduled for a HIDA scan at [facility] (1st floor) on 11-20-13. Please arrive 15 minutes prior to your scheduled appointment on 1 pm. Make certain not to have anything to eat or drink at least 6 hours prior to your test. Should this appointment date or time not work well for you, please call radiology scheduling at [phone].   DO NOT TAKE YOUR STOMACH MEDICATIONS THAT MORNING  _____________________________________________________________________ hepatobiliary (HIDA) scan is an imaging procedure used to diagnose problems in the liver, gallbladder and bile ducts. In the HIDA scan, a radioactive chemical or tracer is injected into a vein in your arm. The tracer is handled by the liver like bile. Bile is a fluid produced and excreted by your liver that helps your digestive system break down fats in the foods you eat. Bile is stored in your gallbladder and the gallbladder releases the bile when you eat a meal. A special nuclear medicine scanner (gamma camera) tracks the flow of the tracer from your liver into your gallbladder and small intestine.  During your HIDA scan  You'll be asked to change into a hospital gown before your HIDA scan begins. Your health care team will position you on a table, usually on your back. The radioactive tracer is then injected into a vein in your arm.The tracer travels through your bloodstream to your liver, where it's taken up by the bile-producing cells. The radioactive tracer travels with the bile from your liver into your gallbladder and through your bile ducts to your small intestine.You may feel some pressure while the radioactive tracer is injected into your vein. As you lie on the table, a special gamma camera is positioned over your abdomen taking pictures of the tracer as it moves through your body. The gamma camera takes pictures continually for about an hour. You'll need to keep still during the HIDA scan. This can become uncomfortable, but you may find  that you can lessen the discomfort by taking deep breaths and thinking about other things. Tell your health care team if you're uncomfortable. The radiologist will watch on a computer the progress of the radioactive tracer through your body. The HIDA scan may be stopped when the radioactive tracer is seen in the gallbladder and enters your small intestine. This typically takes about an hour. In some cases extra imaging will be performed if original images aren't satisfactory, if morphine is given to help visualize the gallbladder or if the medication CCK is given to look at the contraction of the gallbladder. This test typically takes 2 hours to complete. ________________________________________________________________________  Your physician has requested that you go to the basement for the following lab work before leaving today: Liver Function Test   We have sent the following medications to your pharmacy for you to pick up at your convenience: Tramadol 50 mg, please take one tablet by mouth every six to eight hours as needed for pain   You will receive a call regarding your Dietician appointment.

## 2013-11-08 ENCOUNTER — Telehealth: Payer: Self-pay | Admitting: Gastroenterology

## 2013-11-08 DIAGNOSIS — R1011 Right upper quadrant pain: Secondary | ICD-10-CM

## 2013-11-08 NOTE — Telephone Encounter (Signed)
Called and scheduled CCK HIDA for 11/13/13 at 08:30am, arrive at 0800am, NPO after midnight. A HIDA was previously scheduled in December, 2014 and insurance denied it. Ph [phone] option [phone], fax [phone] attn Melissa.

## 2013-11-13 NOTE — Telephone Encounter (Signed)
Pt had procedure today.

## 2013-11-16 ENCOUNTER — Telehealth: Payer: Self-pay | Admitting: Gastroenterology

## 2013-11-16 NOTE — Telephone Encounter (Signed)
Pt called for results of her HIDA Scan. [facility] in [place], [place] and they will fax results. Informed pt the HIDA Scan was normal; I will give it to Dr Sharlett Iles for Monday. Pt stated understanding. Dr Sharlett Iles, please review HIDA SCAN and advise pt on her next step. Thanks.

## 2013-11-19 NOTE — Telephone Encounter (Signed)
She has fatty liver and heeds to lose 30lps....referback to dietary...no further GI W?U needed

## 2013-11-20 ENCOUNTER — Encounter (HOSPITAL_COMMUNITY): Payer: Medicare Other

## 2013-11-22 NOTE — Telephone Encounter (Signed)
Needs Script for Lancets

## 2013-11-23 ENCOUNTER — Encounter: Payer: Self-pay | Admitting: Gastroenterology

## 2013-11-23 NOTE — Telephone Encounter (Signed)
Informed pt of Dr Buel Ream instructions. She states she thought she had a referral, but no one has called. lmom at nutrition to call pt; referral made on 11/06/13. Pt will call me Wednesday if no one has called her.

## 2013-11-26 NOTE — Telephone Encounter (Signed)
Received a call from [place] at [facility]; they had called pt on 11/09/13 and pt stated she wanted to go closer to home .

## 2013-12-06 ENCOUNTER — Encounter: Payer: Self-pay | Admitting: Gastroenterology

## 2013-12-06 ENCOUNTER — Other Ambulatory Visit: Payer: Self-pay | Admitting: General Practice

## 2013-12-11 ENCOUNTER — Other Ambulatory Visit: Payer: Self-pay | Admitting: Family Medicine

## 2013-12-24 ENCOUNTER — Other Ambulatory Visit: Payer: Self-pay | Admitting: Pharmacist

## 2013-12-24 ENCOUNTER — Encounter: Payer: Self-pay | Admitting: Family Medicine

## 2013-12-24 ENCOUNTER — Ambulatory Visit (INDEPENDENT_AMBULATORY_CARE_PROVIDER_SITE_OTHER): Payer: PRIVATE HEALTH INSURANCE | Admitting: Family Medicine

## 2013-12-24 VITALS — BP 166/70 | HR 93 | Temp 97.8°F | Ht 60.5 in | Wt 191.0 lb

## 2013-12-24 DIAGNOSIS — M810 Age-related osteoporosis without current pathological fracture: Secondary | ICD-10-CM

## 2013-12-24 DIAGNOSIS — I10 Essential (primary) hypertension: Secondary | ICD-10-CM

## 2013-12-24 DIAGNOSIS — K219 Gastro-esophageal reflux disease without esophagitis: Secondary | ICD-10-CM

## 2013-12-24 DIAGNOSIS — E785 Hyperlipidemia, unspecified: Secondary | ICD-10-CM

## 2013-12-24 DIAGNOSIS — E119 Type 2 diabetes mellitus without complications: Secondary | ICD-10-CM

## 2013-12-24 DIAGNOSIS — E559 Vitamin D deficiency, unspecified: Secondary | ICD-10-CM

## 2013-12-24 DIAGNOSIS — N189 Chronic kidney disease, unspecified: Secondary | ICD-10-CM

## 2013-12-24 MED ORDER — DENOSUMAB 60 MG/ML ~~LOC~~ SOLN
60.0000 mg | Freq: Once | SUBCUTANEOUS | Status: AC
  Administered 2013-12-24: 60 mg via SUBCUTANEOUS

## 2013-12-24 NOTE — Patient Instructions (Addendum)
Continue current medications. Continue good therapeutic lifestyle changes which include good diet and exercise. Fall precautions discussed with patient. Schedule your flu vaccine if you haven't had it yet If you are over 76 years old - you may need Prevnar 56 or the adult Pneumonia vaccine. Please continue to drink plenty of fluids Continue to use a cool mist humidifier use saline nose spray and gel to keep her nasal passages from drying out Consider Lifeline that you can wear around her neck and call someone in case there is an emergency

## 2013-12-24 NOTE — Progress Notes (Signed)
Subjective:    Patient ID: [name], female    DOB: 1936-09-23, 78 y.o.   MRN: [id number]  HPI Pt here for follow up and management of chronic medical problems. She complains with arthralgias, she also has had nausea and indicates that the Zofran makes her more nauseated. She is somewhat distressed because her husband is currently in the hospital with a GI bleed. One of her sons is staying at home with her at the present time. The patient was alert today and cooperative.       Patient Active Problem List   Diagnosis Date Noted  . Vitamin D insufficiency 03/29/2013  . Chronic kidney disease 09/14/2011  . CVA (cerebral infarction), past history 09/14/2011  . 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 DIABETES MELLITUS, TYPE II 08/12/2008  . HYPERLIPIDEMIA 08/12/2008  . HYPERTENSION 08/12/2008  . GERD 08/12/2008  . OSTEOARTHRITIS, lumbar spine 08/12/2008  . OSTEOPOROSIS 08/12/2008  . KNEE REPLACEMENT, RIGHT, HX OF 08/12/2008   Outpatient Encounter Prescriptions as of 12/24/2013  Medication Sig  . acetaminophen (TYLENOL) 500 MG tablet Take 1,000 mg by mouth every 6 (six) hours as needed.   Marland Kitchen amLODipine (NORVASC) 10 MG tablet TAKE 1 TABLET ONCE A DAY  . aspirin 325 MG EC tablet Take 325 mg by mouth daily.    . B Complex-C-Folic Acid (MULTIVITAMIN, STRESS FORMULA) tablet Take 1 tablet by mouth daily.    Marland Kitchen denosumab (PROLIA) 60 MG/ML SOLN injection Inject 60 mg into the skin every 6 (six) months. Administer in upper arm, thigh, or abdomen  . guaiFENesin (MUCINEX) 600 MG 12 hr tablet Take 1,200 mg by mouth 2 (two) times daily.  . insulin lispro (HUMALOG KWIKPEN) 100 UNIT/ML SOPN Inject up to 12 units before lunch as directed based on BG  . insulin lispro protamine-lispro (HUMALOG 50/50) (50-50) 100 UNIT/ML SUSP injection 62 units each morning and 36 units each evening  . Insulin Pen Needle (PEN NEEDLES) 31G X 6 MM MISC USE AS DIRECTED  . Lancets (ONETOUCH ULTRASOFT)  lancets CHECK BLOOD SUGAR 4 TIMES A DAY  . levothyroxine (SYNTHROID, LEVOTHROID) 25 MCG tablet TAKE 1 TABLET DAILY MONDAY THRU FRIDAY, AND 2 TABLETS ON SATURDAY ANDSUNDAY  . loratadine (CLARITIN) 10 MG tablet Take 10 mg by mouth daily as needed for allergies.  . metoprolol tartrate (LOPRESSOR) 25 MG tablet TAKE  (1)  TABLET TWICE A DAY.  . Multiple Vitamins-Minerals (SENTRY SENIOR) TABS Take 1 tablet by mouth daily.  Marland Kitchen omega-3 acid ethyl esters (LOVAZA) 1 G capsule TAKE (2) CAPSULES TWICE DAILY.  Marland Kitchen omeprazole (PRILOSEC) 20 MG capsule TAKE  (1)  TABLET TWICE A DAY.  . ONE TOUCH ULTRA TEST test strip CHECK BLOOD SUGAR UP TO 3 TIMES A DAY AS DIRECTED  . traMADol (ULTRAM) 50 MG tablet Take 1 tablet (50 mg total) by mouth every 6 (six) hours as needed.  . Vitamin D, Ergocalciferol, (DRISDOL) 50000 UNITS CAPS capsule Take 50,000 Units by mouth every 7 (seven) days.  . clindamycin (CLEOCIN) 300 MG capsule Take 300 mg by mouth as needed. For dental appt  . ondansetron (ZOFRAN) 8 MG tablet Take 1 tablet (8 mg total) by mouth every 8 (eight) hours as needed for nausea or vomiting.    Review of Systems  Constitutional: Negative.   HENT: Negative.   Eyes: Negative.   Respiratory: Negative.   Cardiovascular: Negative.   Gastrointestinal: Positive for nausea.  Endocrine: Negative.   Genitourinary: Negative.   Musculoskeletal: Positive for arthralgias.  Skin: Negative.   Allergic/Immunologic: Negative.   Neurological: Negative.   Hematological: Negative.   Psychiatric/Behavioral: Negative.        Objective:   Physical Exam  Nursing note and vitals reviewed. Constitutional: She is oriented to person, place, and time. She appears well-developed and well-nourished. She appears distressed.  HENT:  [name]: Normocephalic and atraumatic.  Right Ear: External ear normal.  Left Ear: External ear normal.  Mouth/Throat: Oropharynx is clear and moist.  Dry and irritated nasal passages bilateral, she is  wearing bilateral hearing aids.  Eyes: Conjunctivae and EOM are normal. Pupils are equal, round, and reactive to light. Right eye exhibits no discharge. Left eye exhibits no discharge. No scleral icterus.  Neck: Normal range of motion. Neck supple. No JVD present. No thyromegaly present.  Cardiovascular: Normal rate, regular rhythm, normal heart sounds and intact distal pulses.  Exam reveals no gallop and no friction rub.   No murmur heard. At 84 per minute  Pulmonary/Chest: Effort normal and breath sounds normal. No respiratory distress. She has no wheezes. She has no rales. She exhibits no tenderness.  Abdominal: Soft. Bowel sounds are normal. She exhibits no mass. There is no tenderness. There is no rebound and no guarding.  The patient has a colostomy bag which appears to be functioning adequately with no erythema at the ostomy site. The abdomen was protuberant and distended but bowel sounds were normal.  Musculoskeletal: Normal range of motion. She exhibits no edema and no tenderness.  The patient uses a rolling walker for ambulation but appears stable  musculoskeletal wise  Lymphadenopathy:    She has no cervical adenopathy.  Neurological: She is alert and oriented to person, place, and time. She has normal reflexes.  Skin: Skin is warm and dry.  Psychiatric: She has a normal mood and affect. Her behavior is normal. Judgment and thought content normal.   BP 166/70  Pulse 93  Temp(Src) 97.8 F (36.6 C) (Oral)  Ht 5' 0.5" (1.537 m)  Wt 191 lb (86.637 kg)  BMI 36.67 kg/m2        Assessment & Plan:  1. Chronic kidney disease - POCT CBC; Future - BMP8+EGFR; Future  2. DIABETES MELLITUS, TYPE II - POCT CBC; Future - POCT glycosylated hemoglobin (Hb A1C); Future  3. GERD - POCT CBC; Future  4. HYPERLIPIDEMIA - POCT CBC; Future - NMR, lipoprofile; Future  5. HYPERTENSION - POCT CBC; Future - BMP8+EGFR; Future - Hepatic function panel; Future  6. Vitamin D  insufficiency - POCT CBC; Future - Vit D  25 hydroxy (rtn osteoporosis monitoring); Future  No orders of the defined types were placed in this encounter.   Patient Instructions  Continue current medications. Continue good therapeutic lifestyle changes which include good diet and exercise. Fall precautions discussed with patient. Schedule your flu vaccine if you haven't had it yet If you are over 63 years old - you may need Prevnar 51 or the adult Pneumonia vaccine. Please continue to drink plenty of fluids Continue to use a cool mist humidifier use saline nose spray and gel to keep her nasal passages from drying out Consider Lifeline that you can wear around her neck and call someone in case there is an emergency    Arrie Senate MD

## 2014-01-01 ENCOUNTER — Other Ambulatory Visit: Payer: Self-pay | Admitting: Family Medicine

## 2014-01-11 ENCOUNTER — Encounter: Payer: Self-pay | Admitting: *Deleted

## 2014-01-14 ENCOUNTER — Other Ambulatory Visit (INDEPENDENT_AMBULATORY_CARE_PROVIDER_SITE_OTHER): Payer: PRIVATE HEALTH INSURANCE

## 2014-01-14 ENCOUNTER — Other Ambulatory Visit: Payer: Self-pay | Admitting: Pharmacist

## 2014-01-14 DIAGNOSIS — E559 Vitamin D deficiency, unspecified: Secondary | ICD-10-CM

## 2014-01-14 DIAGNOSIS — I10 Essential (primary) hypertension: Secondary | ICD-10-CM

## 2014-01-14 DIAGNOSIS — E119 Type 2 diabetes mellitus without complications: Secondary | ICD-10-CM

## 2014-01-14 DIAGNOSIS — K219 Gastro-esophageal reflux disease without esophagitis: Secondary | ICD-10-CM

## 2014-01-14 DIAGNOSIS — E785 Hyperlipidemia, unspecified: Secondary | ICD-10-CM

## 2014-01-14 DIAGNOSIS — N189 Chronic kidney disease, unspecified: Secondary | ICD-10-CM

## 2014-01-14 LAB — POCT CBC
Granulocyte percent: 56.4 %G (ref 37–80)
HCT, POC: 40.6 % (ref 37.7–47.9)
Hemoglobin: 12.9 g/dL (ref 12.2–16.2)
LYMPH, POC: 2.2 (ref 0.6–3.4)
MCH, POC: 29.6 pg (ref 27–31.2)
MCHC: 31.8 g/dL (ref 31.8–35.4)
MCV: 93 fL (ref 80–97)
MPV: 8.4 fL (ref 0–99.8)
PLATELET COUNT, POC: 222 10*3/uL (ref 142–424)
POC Granulocyte: 3.7 (ref 2–6.9)
POC LYMPH %: 33.2 % (ref 10–50)
RBC: 4.4 M/uL (ref 4.04–5.48)
RDW, POC: 14.6 %
WBC: 6.6 10*3/uL (ref 4.6–10.2)

## 2014-01-14 LAB — POCT GLYCOSYLATED HEMOGLOBIN (HGB A1C): Hemoglobin A1C: 7.6

## 2014-01-14 MED ORDER — INSULIN LISPRO PROT & LISPRO (50-50 MIX) 100 UNIT/ML KWIKPEN: PEN_INJECTOR | SUBCUTANEOUS | Status: DC

## 2014-01-14 NOTE — Progress Notes (Signed)
Patient came in for labs only.

## 2014-01-15 ENCOUNTER — Encounter: Payer: Self-pay | Admitting: Family Medicine

## 2014-01-15 LAB — BMP8+EGFR
BUN / CREAT RATIO: 13 (ref 11–26)
BUN: 19 mg/dL (ref 8–27)
CHLORIDE: 104 mmol/L (ref 97–108)
CO2: 17 mmol/L — AB (ref 18–29)
Calcium: 8.9 mg/dL (ref 8.7–10.3)
Creatinine, Ser: 1.45 mg/dL — ABNORMAL HIGH (ref 0.57–1.00)
GFR calc Af Amer: 41 mL/min/{1.73_m2} — ABNORMAL LOW (ref >59)
GFR calc non Af Amer: 35 mL/min/{1.73_m2} — ABNORMAL LOW (ref >59)
Glucose: 192 mg/dL — ABNORMAL HIGH (ref 65–99)
Potassium: 4.9 mmol/L (ref 3.5–5.2)
SODIUM: 139 mmol/L (ref 134–144)

## 2014-01-15 LAB — NMR, LIPOPROFILE
Cholesterol: 350 mg/dL — ABNORMAL HIGH (ref <200)
HDL Cholesterol by NMR: 53 mg/dL (ref >=40)
HDL Particle Number: 33.9 umol/L (ref >=30.5)
LDL Particle Number: 1512 nmol/L — ABNORMAL HIGH (ref <1000)
LDL Size: 20 nm — ABNORMAL LOW (ref >20.5)
LP-IR Score: 65 — ABNORMAL HIGH (ref <=45)
SMALL LDL PARTICLE NUMBER: 91 nmol/L (ref <=527)
Triglycerides by NMR: 457 mg/dL — ABNORMAL HIGH (ref <150)

## 2014-01-15 LAB — HEPATIC FUNCTION PANEL
ALT: 54 IU/L — AB (ref 0–32)
AST: 63 IU/L — AB (ref 0–40)
Albumin: 3.4 g/dL — ABNORMAL LOW (ref 3.5–4.8)
Alkaline Phosphatase: 166 IU/L — ABNORMAL HIGH (ref 39–117)
BILIRUBIN TOTAL: 0.3 mg/dL (ref 0.0–1.2)
Bilirubin, Direct: 0.08 mg/dL (ref 0.00–0.40)
Total Protein: 6.7 g/dL (ref 6.0–8.5)

## 2014-01-15 LAB — VITAMIN D 25 HYDROXY (VIT D DEFICIENCY, FRACTURES): VIT D 25 HYDROXY: 12.8 ng/mL — AB (ref 30.0–100.0)

## 2014-02-07 ENCOUNTER — Other Ambulatory Visit: Payer: Self-pay | Admitting: Family Medicine

## 2014-02-15 ENCOUNTER — Ambulatory Visit (INDEPENDENT_AMBULATORY_CARE_PROVIDER_SITE_OTHER): Payer: PRIVATE HEALTH INSURANCE | Admitting: Physician Assistant

## 2014-02-15 VITALS — BP 162/62 | HR 64 | Temp 98.2°F | Ht 61.5 in | Wt 193.2 lb

## 2014-02-15 DIAGNOSIS — H9202 Otalgia, left ear: Secondary | ICD-10-CM

## 2014-02-15 DIAGNOSIS — H9209 Otalgia, unspecified ear: Secondary | ICD-10-CM

## 2014-02-15 MED ORDER — FLUTICASONE PROPIONATE 50 MCG/ACT NA SUSP: 2.0000 | Freq: Every day | NASAL | Status: DC

## 2014-02-15 NOTE — Progress Notes (Signed)
Subjective:     Patient ID: [name], female   DOB: 07/23/1936, 78 y.o.   MRN: [id number]  HPI 78 y/o female presents for ear pain that has been intermittent for the past week. Associated PND and sinus pain/pressure. Has had similar symptoms in the past.    Review of Systems  Constitutional: Negative for fever, chills, diaphoresis, activity change, appetite change and fatigue.  HENT: Positive for congestion (nasal), ear pain (left) and sinus pressure. Negative for dental problem, drooling, ear discharge, facial swelling, hearing loss, mouth sores, nosebleeds, postnasal drip, rhinorrhea, sneezing, sore throat and tinnitus.   Eyes: Negative.   Respiratory: Negative.   Cardiovascular: Negative.   Endocrine: Negative.   Neurological: Negative.        Objective:   Physical Exam  Nursing note and vitals reviewed. Constitutional: She appears well-developed and well-nourished. No distress.  HENT:  Head: Normocephalic and atraumatic.  Right Ear: External ear normal.  Left Ear: Tympanic membrane and external ear normal. No lacerations. No drainage, swelling or tenderness. No foreign bodies. No mastoid tenderness. Tympanic membrane is not injected, not erythematous and not bulging.  Mouth/Throat: Oropharynx is clear and moist. No oropharyngeal exudate.  Mild cerumen in Left ear  Eyes: Right eye exhibits no discharge. Left eye exhibits no discharge.  Neck: No thyromegaly present.  Pulmonary/Chest: Effort normal and breath sounds normal. No respiratory distress. She has no wheezes. She has no rales. She exhibits no tenderness.  Skin: She is not diaphoretic.       Assessment:   1. Eustachian tube dysfunction     Plan:     1. OTC Sudafed on short term basis ( up to 5 days). Monitor BP while taking. Drink plenty of fluids 2. Rx Flonase nasal spray for sinus inflammation: 2 spraysin each nostril q day. Due to lack of fever and duration of s/s, I do not feel that an antibiotic is needed  at this time.  RTC for f/u in 2 weeks.

## 2014-02-17 ENCOUNTER — Encounter: Payer: Self-pay | Admitting: Physician Assistant

## 2014-02-22 ENCOUNTER — Other Ambulatory Visit: Payer: Self-pay | Admitting: Family Medicine

## 2014-03-08 ENCOUNTER — Ambulatory Visit (INDEPENDENT_AMBULATORY_CARE_PROVIDER_SITE_OTHER): Payer: PRIVATE HEALTH INSURANCE | Admitting: Physician Assistant

## 2014-03-08 ENCOUNTER — Encounter: Payer: Self-pay | Admitting: Physician Assistant

## 2014-03-08 VITALS — BP 156/72 | HR 84 | Temp 98.6°F | Ht 61.5 in | Wt 192.0 lb

## 2014-03-08 DIAGNOSIS — H6690 Otitis media, unspecified, unspecified ear: Secondary | ICD-10-CM

## 2014-03-08 MED ORDER — AZITHROMYCIN 250 MG PO TABS: ORAL_TABLET | ORAL | Status: DC

## 2014-03-08 NOTE — Progress Notes (Signed)
   Subjective:    Patient ID: [name], female    DOB: September 05, 1936, 78 y.o.   MRN: [id number]  HPI 78 y/o female presents for 2 wk f/u of ear pain. Has tried OTC short term Sudafed and flonase with no relief. Pain worse with swallowing and bending over.     Review of Systems  Constitutional: Negative.   HENT: Positive for congestion, ear pain (left ) and sinus pressure. Negative for nosebleeds, postnasal drip and rhinorrhea.   Eyes: Negative.   Respiratory: Negative.   Cardiovascular: Negative.   Endocrine: Negative.        Objective:   Physical Exam  Vitals reviewed. Constitutional: She appears well-developed and well-nourished. No distress.  HENT:  Head: Normocephalic and atraumatic.  Right Ear: External ear normal.  Left Ear: External ear normal.  Mouth/Throat: Oropharynx is clear and moist.  Cerumen in left ear canal partially blocking view of TM. Slight injection of visualized TM. R ear TM clear with landmarks present.   Neck: Normal range of motion. Neck supple.  TTP of eustachian tube and mastoid process areas  Cardiovascular: Normal rate, regular rhythm, normal heart sounds and intact distal pulses.  Exam reveals no gallop and no friction rub.   No murmur heard. Pulmonary/Chest: Effort normal and breath sounds normal.  Skin: She is not diaphoretic.          Assessment & Plan:  1 Eustachian Tube Dysfunction with resulting AOM of L ear: Since previous tx did not resolve s/s , I will tx w/ azithromycin 250mg , 2 tabs on day 1, 1 tab days 2-5. Also advised patient to use OTC Debrox for cerumen relief. F/U if s/s worsen or DNI. Stop sudafed and

## 2014-03-11 ENCOUNTER — Other Ambulatory Visit: Payer: Self-pay | Admitting: Family Medicine

## 2014-03-27 ENCOUNTER — Other Ambulatory Visit: Payer: Self-pay | Admitting: Family Medicine

## 2014-04-09 ENCOUNTER — Other Ambulatory Visit: Payer: Self-pay | Admitting: Family Medicine

## 2014-04-30 ENCOUNTER — Encounter: Payer: Self-pay | Admitting: Family Medicine

## 2014-04-30 ENCOUNTER — Ambulatory Visit (INDEPENDENT_AMBULATORY_CARE_PROVIDER_SITE_OTHER): Payer: PRIVATE HEALTH INSURANCE | Admitting: Family Medicine

## 2014-04-30 VITALS — BP 153/61 | HR 68 | Temp 97.3°F | Ht 61.5 in | Wt 189.0 lb

## 2014-04-30 DIAGNOSIS — I10 Essential (primary) hypertension: Secondary | ICD-10-CM

## 2014-04-30 DIAGNOSIS — E785 Hyperlipidemia, unspecified: Secondary | ICD-10-CM

## 2014-04-30 DIAGNOSIS — E559 Vitamin D deficiency, unspecified: Secondary | ICD-10-CM

## 2014-04-30 DIAGNOSIS — N189 Chronic kidney disease, unspecified: Secondary | ICD-10-CM

## 2014-04-30 DIAGNOSIS — K219 Gastro-esophageal reflux disease without esophagitis: Secondary | ICD-10-CM

## 2014-04-30 DIAGNOSIS — E119 Type 2 diabetes mellitus without complications: Secondary | ICD-10-CM

## 2014-04-30 DIAGNOSIS — E039 Hypothyroidism, unspecified: Secondary | ICD-10-CM

## 2014-04-30 NOTE — Patient Instructions (Addendum)
Medicare Annual Wellness Visit  [facility] and the medical providers at [facility] strive to bring you the best medical care.  In doing so we not only want to address your current medical conditions and concerns but also to detect new conditions early and prevent illness, disease and health-related problems.    Medicare offers a yearly Wellness Visit which allows our clinical staff to assess your need for preventative services including immunizations, lifestyle education, counseling to decrease risk of preventable diseases and screening for fall risk and other medical concerns.    This visit is provided free of charge (no copay) for all Medicare recipients. The clinical pharmacists at [facility] have begun to conduct these Wellness Visits which will also include a thorough review of all your medications.    As you primary medical provider recommend that you make an appointment for your Annual Wellness Visit if you have not done so already this year.  You may set up this appointment before you leave today or you may call back ([phone]) and schedule an appointment.  Please make sure when you call that you mention that you are scheduling your Annual Wellness Visit with the clinical pharmacist so that the appointment may be made for the proper length of time.      Continue current medications. Continue good therapeutic lifestyle changes which include good diet and exercise. Fall precautions discussed with patient. If an FOBT was given today- please return it to our front desk. If you are over 8 years old - you may need Prevnar 70 or the adult Pneumonia vaccine.  Monitor blood pressures more regularly at home Continue to keep blood sugars checked regularly Keep your appointment in August Return to clinic for fasting blood work Use your walker and exercise as much as possible at time. Continue to take extra strength Tylenol as  needed for pain

## 2014-04-30 NOTE — Progress Notes (Signed)
Subjective:    Patient ID: [name], female    DOB: 08-06-1936, 78 y.o.   MRN: [id number]  HPI Patient is here today for annual wellness exam and follow up of chronic medical problems. The patient is doing well with no particular complaints. The patient is to get fasting lab work and she will return to the office for this. She has an appointment scheduled in August of 15. The patient does complain of some back problems and she has seen the neurosurgeon about this in the past. She has no problems with bladder control or incontinence. She does not monitor her blood pressures regularly at home. The patient is alert and pleasant and uses her walker for ambulation.         Patient Active Problem List   Diagnosis Date Noted  . Vitamin D insufficiency 03/29/2013  . Chronic kidney disease 09/14/2011  . CVA (cerebral infarction), past history 09/14/2011  . 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 DIABETES MELLITUS, TYPE II 08/12/2008  . HYPERLIPIDEMIA 08/12/2008  . HYPERTENSION 08/12/2008  . GERD 08/12/2008  . OSTEOARTHRITIS, lumbar spine 08/12/2008  . OSTEOPOROSIS 08/12/2008  . KNEE REPLACEMENT, RIGHT, HX OF 08/12/2008   Outpatient Encounter Prescriptions as of 04/30/2014  Medication Sig  . acetaminophen (TYLENOL) 500 MG tablet Take 1,000 mg by mouth every 6 (six) hours as needed.   Marland Kitchen amLODipine (NORVASC) 10 MG tablet TAKE 1 TABLET ONCE A DAY  . aspirin 325 MG EC tablet Take 325 mg by mouth daily.    . B Complex-C-Folic Acid (MULTIVITAMIN, STRESS FORMULA) tablet Take 1 tablet by mouth daily.    Marland Kitchen denosumab (PROLIA) 60 MG/ML SOLN injection Inject 60 mg into the skin every 6 (six) months. Administer in upper arm, thigh, or abdomen  . fluticasone (FLONASE) 50 MCG/ACT nasal spray Place 2 sprays into both nostrils daily.  Marland Kitchen guaiFENesin (MUCINEX) 600 MG 12 hr tablet Take 1,200 mg by mouth 2 (two) times daily.  Marland Kitchen HUMALOG MIX 50/50 KWIKPEN (50-50) 100 UNIT/ML Kwikpen INJECT  34 UNITS EACH MORNING AND 36 UNITS EACH EVENING  . insulin lispro (HUMALOG KWIKPEN) 100 UNIT/ML KiwkPen INJECT UP TO 12 UNITS BEFORE LUNCH AS DIRECTED BASED ON BLOOD GLUCOSE  . Insulin Pen Needle (PEN NEEDLES 31GX5/16") 31G X 8 MM MISC USE AS DIRECTED  . Lancets (ONETOUCH ULTRASOFT) lancets CHECK BLOOD SUGAR 4 TIMES A DAY  . levothyroxine (SYNTHROID, LEVOTHROID) 25 MCG tablet TAKE 1 TABLET DAILY MONDAY THRU FRIDAY, AND 2 TABLETS ON SATURDAY ANDSUNDAY  . loratadine (CLARITIN) 10 MG tablet Take 10 mg by mouth daily as needed for allergies.  . metoprolol tartrate (LOPRESSOR) 25 MG tablet TAKE  (1)  TABLET TWICE A DAY.  . Multiple Vitamins-Minerals (SENTRY SENIOR) TABS Take 1 tablet by mouth daily.  Marland Kitchen omega-3 acid ethyl esters (LOVAZA) 1 G capsule TAKE (2) CAPSULES TWICE DAILY.  Marland Kitchen omeprazole (PRILOSEC) 20 MG capsule TAKE  (1)  TABLET TWICE A DAY.  . ONE TOUCH ULTRA TEST test strip CHECK BLOOD SUGAR UP TO 3 TIMES A DAY AS DIRECTED  . traMADol (ULTRAM) 50 MG tablet Take 1 tablet (50 mg total) by mouth every 6 (six) hours as needed.  . [DISCONTINUED] HUMALOG KWIKPEN 100 UNIT/ML KiwkPen INJECT UP TO 8 UNITS BEFORE LUNCH AS DIRECTED BASED ON BLOOD GLUCOSE  . ondansetron (ZOFRAN) 8 MG tablet Take 1 tablet (8 mg total) by mouth every 8 (eight) hours as needed for nausea or vomiting.  . [DISCONTINUED] azithromycin (ZITHROMAX) 250 MG  tablet Take 2 tablets on day 1, then 1 tablet on day 2-5  . [DISCONTINUED] insulin lispro (HUMALOG KWIKPEN) 100 UNIT/ML SOPN Inject up to 12 units before lunch as directed based on BG  . [DISCONTINUED] Vitamin D, Ergocalciferol, (DRISDOL) 50000 UNITS CAPS capsule Take 50,000 Units by mouth every 7 (seven) days.    Review of Systems  Constitutional: Negative.   HENT: Negative.   Eyes: Negative.   Respiratory: Negative.   Cardiovascular: Negative.   Gastrointestinal: Negative.   Endocrine: Negative.   Genitourinary: Negative.   Musculoskeletal: Negative.   Skin: Negative.    Allergic/Immunologic: Negative.   Neurological: Negative.   Hematological: Negative.   Psychiatric/Behavioral: Negative.        Objective:   Physical Exam  Nursing note and vitals reviewed. Constitutional: She is oriented to person, place, and time. She appears well-developed and well-nourished. No distress.  HENT:  Head: Normocephalic and atraumatic.  Right Ear: External ear normal.  Left Ear: External ear normal.  Mouth/Throat: Oropharynx is clear and moist. No oropharyngeal exudate.  Minimal nasal turbinate congestion bilaterally  Eyes: Conjunctivae and EOM are normal. Pupils are equal, round, and reactive to light. Right eye exhibits no discharge. Left eye exhibits no discharge. No scleral icterus.  Neck: Normal range of motion. Neck supple. No thyromegaly present.  Cardiovascular: Normal rate, regular rhythm, normal heart sounds and intact distal pulses.  Exam reveals no gallop and no friction rub.   No murmur heard. At 60 per minute  Pulmonary/Chest: Effort normal and breath sounds normal. No respiratory distress. She has no wheezes. She has no rales. She exhibits no tenderness.  Abdominal: Soft. Bowel sounds are normal. She exhibits no mass. There is tenderness. There is no rebound and no guarding.  Multiple scars secondary to previous surgeries. There is generalized abdominal tenderness. There was no organomegaly. Ileostomy site looks good with no redness or edema  Musculoskeletal: Normal range of motion. She exhibits no edema and no tenderness.  Range of motion somewhat limited but was able to get on the table for an exam without problems, but with assistance.  Lymphadenopathy:    She has no cervical adenopathy.  Neurological: She is alert and oriented to person, place, and time. She has normal reflexes. No cranial nerve deficit.  Skin: Skin is warm and dry. No rash noted.  Psychiatric: She has a normal mood and affect. Her behavior is normal. Judgment and thought content  normal.   BP 153/61  Pulse 68  Temp(Src) 97.3 F (36.3 C) (Oral)  Ht 5' 1.5" (1.562 m)  Wt 189 lb (85.73 kg)  BMI 35.14 kg/m2        Assessment & Plan:  1. CRI (chronic renal insufficiency), unspecified stage - POCT CBC; Future - BMP8+EGFR; Future  2. DIABETES MELLITUS, TYPE II - POCT CBC; Future - POCT glycosylated hemoglobin (Hb A1C); Future - BMP8+EGFR; Future - POCT UA - Microalbumin; Future  3. Gastroesophageal reflux disease, esophagitis presence not specified - POCT CBC; Future  4. HYPERLIPIDEMIA - POCT CBC; Future - NMR, lipoprofile; Future  5. HYPERTENSION - POCT CBC; Future - BMP8+EGFR; Future - Hepatic function panel; Future  6. Vitamin D insufficiency - POCT CBC; Future - Vit D  25 hydroxy (rtn osteoporosis monitoring); Future  7. Hypothyroidism, unspecified hypothyroidism type - Thyroid Panel With TSH; Future  Patient Instructions                       Medicare Annual Wellness Visit  [facility] and the medical providers at [facility] strive to bring you the best medical care.  In doing so we not only want to address your current medical conditions and concerns but also to detect new conditions early and prevent illness, disease and health-related problems.    Medicare offers a yearly Wellness Visit which allows our clinical staff to assess your need for preventative services including immunizations, lifestyle education, counseling to decrease risk of preventable diseases and screening for fall risk and other medical concerns.    This visit is provided free of charge (no copay) for all Medicare recipients. The clinical pharmacists at [facility] have begun to conduct these Wellness Visits which will also include a thorough review of all your medications.    As you primary medical provider recommend that you make an appointment for your Annual Wellness Visit if you have not done so already this year.  You  may set up this appointment before you leave today or you may call back ([phone]) and schedule an appointment.  Please make sure when you call that you mention that you are scheduling your Annual Wellness Visit with the clinical pharmacist so that the appointment may be made for the proper length of time.      Continue current medications. Continue good therapeutic lifestyle changes which include good diet and exercise. Fall precautions discussed with patient. If an FOBT was given today- please return it to our front desk. If you are over 37 years old - you may need Prevnar 107 or the adult Pneumonia vaccine.  Monitor blood pressures more regularly at home Continue to keep blood sugars checked regularly Keep your appointment in August Return to clinic for fasting blood work Use your walker and exercise as much as possible at time. Continue to take extra strength Tylenol as needed for pain   Arrie Senate MD

## 2014-05-02 ENCOUNTER — Other Ambulatory Visit (INDEPENDENT_AMBULATORY_CARE_PROVIDER_SITE_OTHER): Payer: PRIVATE HEALTH INSURANCE

## 2014-05-02 DIAGNOSIS — N189 Chronic kidney disease, unspecified: Secondary | ICD-10-CM

## 2014-05-02 DIAGNOSIS — E119 Type 2 diabetes mellitus without complications: Secondary | ICD-10-CM

## 2014-05-02 DIAGNOSIS — K219 Gastro-esophageal reflux disease without esophagitis: Secondary | ICD-10-CM

## 2014-05-02 DIAGNOSIS — E785 Hyperlipidemia, unspecified: Secondary | ICD-10-CM

## 2014-05-02 DIAGNOSIS — E559 Vitamin D deficiency, unspecified: Secondary | ICD-10-CM

## 2014-05-02 DIAGNOSIS — E039 Hypothyroidism, unspecified: Secondary | ICD-10-CM

## 2014-05-02 DIAGNOSIS — I10 Essential (primary) hypertension: Secondary | ICD-10-CM

## 2014-05-02 LAB — POCT CBC
GRANULOCYTE PERCENT: 64.7 % (ref 37–80)
HCT, POC: 40.6 % (ref 37.7–47.9)
Hemoglobin: 13.4 g/dL (ref 12.2–16.2)
LYMPH, POC: 2 (ref 0.6–3.4)
MCH, POC: 30.7 pg (ref 27–31.2)
MCHC: 32.9 g/dL (ref 31.8–35.4)
MCV: 93.2 fL (ref 80–97)
MPV: 8.8 fL (ref 0–99.8)
PLATELET COUNT, POC: 216 10*3/uL (ref 142–424)
POC Granulocyte: 4.6 (ref 2–6.9)
POC LYMPH %: 28.4 % (ref 10–50)
RBC: 4.4 M/uL (ref 4.04–5.48)
RDW, POC: 14.4 %
WBC: 7.1 10*3/uL (ref 4.6–10.2)

## 2014-05-02 LAB — POCT UA - MICROALBUMIN: Microalbumin Ur, POC: 50 mg/L

## 2014-05-02 LAB — POCT GLYCOSYLATED HEMOGLOBIN (HGB A1C): Hemoglobin A1C: 7.3

## 2014-05-03 LAB — BMP8+EGFR
BUN / CREAT RATIO: 11 (ref 11–26)
BUN: 19 mg/dL (ref 8–27)
CHLORIDE: 103 mmol/L (ref 97–108)
CO2: 21 mmol/L (ref 18–29)
Calcium: 9.5 mg/dL (ref 8.7–10.3)
Creatinine, Ser: 1.76 mg/dL — ABNORMAL HIGH (ref 0.57–1.00)
GFR calc Af Amer: 32 mL/min/{1.73_m2} — ABNORMAL LOW (ref >59)
GFR calc non Af Amer: 28 mL/min/{1.73_m2} — ABNORMAL LOW (ref >59)
Glucose: 204 mg/dL — ABNORMAL HIGH (ref 65–99)
POTASSIUM: 5.4 mmol/L — AB (ref 3.5–5.2)
SODIUM: 141 mmol/L (ref 134–144)

## 2014-05-03 LAB — HEPATIC FUNCTION PANEL
ALK PHOS: 163 IU/L — AB (ref 39–117)
ALT: 65 IU/L — AB (ref 0–32)
AST: 70 IU/L — ABNORMAL HIGH (ref 0–40)
Albumin: 3.8 g/dL (ref 3.5–4.8)
Bilirubin, Direct: 0.11 mg/dL (ref 0.00–0.40)
Total Bilirubin: 0.3 mg/dL (ref 0.0–1.2)
Total Protein: 6.9 g/dL (ref 6.0–8.5)

## 2014-05-03 LAB — NMR, LIPOPROFILE
Cholesterol: 329 mg/dL — ABNORMAL HIGH (ref 100–199)
HDL Cholesterol by NMR: 51 mg/dL (ref >39)
HDL Particle Number: 37.7 umol/L (ref >=30.5)
LDL Particle Number: 1393 nmol/L — ABNORMAL HIGH (ref <1000)
LDL Size: 19.8 nm (ref >20.5)
LP-IR SCORE: 76 — AB (ref <=45)
Small LDL Particle Number: 183 nmol/L (ref <=527)
Triglycerides by NMR: 403 mg/dL — ABNORMAL HIGH (ref 0–149)

## 2014-05-03 LAB — MICROALBUMIN, URINE: Microalbumin, Urine: 3020.2 ug/mL — ABNORMAL HIGH (ref 0.0–17.0)

## 2014-05-03 LAB — THYROID PANEL WITH TSH
FREE THYROXINE INDEX: 2 (ref 1.2–4.9)
T3 Uptake Ratio: 26 % (ref 24–39)
T4 TOTAL: 7.7 ug/dL (ref 4.5–12.0)
TSH: 4.58 u[IU]/mL — AB (ref 0.450–4.500)

## 2014-05-03 LAB — VITAMIN D 25 HYDROXY (VIT D DEFICIENCY, FRACTURES): Vit D, 25-Hydroxy: 14 ng/mL — ABNORMAL LOW (ref 30.0–100.0)

## 2014-05-06 ENCOUNTER — Other Ambulatory Visit: Payer: Self-pay | Admitting: Family Medicine

## 2014-05-08 ENCOUNTER — Other Ambulatory Visit: Payer: Self-pay | Admitting: *Deleted

## 2014-05-08 MED ORDER — LEVOTHYROXINE SODIUM 50 MCG PO TABS: 50.0000 ug | ORAL_TABLET | Freq: Every day | ORAL | Status: DC

## 2014-05-08 NOTE — Telephone Encounter (Signed)
Patient last seen in office on 04-30-14. Rx last filled on 01-31-13 for #7. Please advise. If approved please route to Pool A so nurse can call in to pharmacy

## 2014-05-13 ENCOUNTER — Ambulatory Visit (INDEPENDENT_AMBULATORY_CARE_PROVIDER_SITE_OTHER): Payer: PRIVATE HEALTH INSURANCE | Admitting: Pharmacist

## 2014-05-13 ENCOUNTER — Encounter: Payer: Self-pay | Admitting: Pharmacist

## 2014-05-13 VITALS — BP 152/72 | HR 70 | Ht 61.5 in | Wt 189.0 lb

## 2014-05-13 DIAGNOSIS — N183 Chronic kidney disease, stage 3 unspecified: Secondary | ICD-10-CM

## 2014-05-13 DIAGNOSIS — M810 Age-related osteoporosis without current pathological fracture: Secondary | ICD-10-CM

## 2014-05-13 DIAGNOSIS — I10 Essential (primary) hypertension: Secondary | ICD-10-CM

## 2014-05-13 DIAGNOSIS — E119 Type 2 diabetes mellitus without complications: Secondary | ICD-10-CM

## 2014-05-13 NOTE — Progress Notes (Signed)
Diabetes Follow-Up Visit Chief Complaint:   Chief Complaint  Patient presents with  . Diabetes  . Hyperlipidemia  . Proteinuria     Filed Vitals:   05/13/14 1554  BP: 152/72  Pulse: 70   Filed Weights   05/13/14 1554  Weight: 189 lb (85.73 kg)    HPI: patient with difficult to control diabetes since metformin was discontinued secondary to elevated Scr.  Over the last 6 months BG has improved but A1C was still suboptimal.  She also continues to have elevated LDL and Tg.  However she is intolerant to statins and no a good candidate for fenofibrate due to low kidney function.     Current Diabetes Medications:  Humalog 50/50 insulin 62 units qam and 36 units qpm - we have tried increasing in past but patient had experienced nocturnal hypoglycemia         Humalog at lunch per sliding scale      71-120     5 units      121-175   7 units      176 - 200   8 units      201 -  225  10 units      226 - 250    11 units      251 or above  12 units    Home BG Monitoring:  Checking  2-3 times a day. Am readings = 139 - 222 Lunch time readings = 101-303 PM readings = 113-327  Current Diet:  Scrambled eggs, ham, cheerios + skim milk   Sweet tea (make with 2/3 cups sugar and 1/3 cup stevia)  Vegetable - green peas, corn, salads  Meats - chicken usually, ham and bacon  Assessment: 1.  Diabetes.  Uncontrolled but continues to improve - some concern about occ. hypogycemia 2. Dyslipidemia - intolerance to statins - elevated Tg and LDL 3.  HTN - elevated today though patient thinks it is white coat HTN, she has not been checking BP at home although she has a BP monitor.  4.  Obesity   Recommendations: 1.  Medication recommendations at this time are as follows:    Change Humalog 50/50 to 64 units qam and 38 units qpm    Call office if experience hypoglycemia/low blood glucose   Humalog continue to use sliding scale at lunch   71-120     5 units   121-175   7 units   176 - 200   8  units   201 -  225  10 units   226 - 250    11 units   251 or above  12 units  2.  Discussed diet in depth - patient to limit serving sizes of high CHO vegetables such as peas, corn and potatoes.  Also recommended that she switch to unsweetened tea.  These are small changes but might make a big difference in her BG and Tg control. Also discussed limiting high fat and high salt containing meats.  3.  Check BP at home - call if continue to get BP readings over 140 / 90. 4.  RTC in 4-6 weeks to recheck BP, weight and BG     Cherre Robins, PharmD, CPP

## 2014-05-13 NOTE — Patient Instructions (Addendum)
Increase Humalog 50/50 mix to 64 units each morning with breakfast and 38 units each evening with supper.  Check blood pressure once daily - bring readings into office  Diabetes and Standards of Medical Care  Diabetes is complicated. You may find that your diabetes team includes a dietitian, nurse, diabetes educator, eye doctor, and more. To help everyone know what is going on and to help you get the care you deserve, the following schedule of care was developed to help keep you on track. Below are the tests, exams, vaccines, medicines, education, and plans you will need.  Blood Glucose Goals Prior to meals = 80 - 130 Within 2 hours of the start of a meal = less than 180  HbA1c test (goal is less than 7.0% - your last value was 7.3%) This test shows how well you have controlled your glucose over the past 2  To 3 months. It is used to see if your diabetes management plan needs to be adjusted.   It is performed at least 2 times a year if you are meeting treatment goals.  It is performed 4 times a year if therapy has changed or if you are not meeting treatment goals.   Blood pressure test  This test is performed at every routine medical visit. The goal is less than 140/90 mmHg for most people, but 130/80 mmHg in some cases. Ask your health care provider about your goal. Dental exam  Follow up with the dentist regularly. Eye exam  If you are diagnosed with type 1 diabetes as a child, get an exam upon reaching the age of 81 years or older and have had diabetes for 3 to 5 years. Yearly eye exams are recommended after that initial eye exam.  If you are diagnosed with type 1 diabetes as an adult, get an exam within 5 years of diagnosis and then yearly.  If you are diagnosed with type 2 diabetes, get an exam as soon as possible after the diagnosis and then yearly. Foot care exam  Visual foot exams are performed at every routine medical visit. The exams check for cuts, injuries, or other  problems with the feet.  A comprehensive foot exam should be done yearly. This includes visual inspection as well as assessing foot pulses and testing for loss of sensation.  Check your feet nightly for cuts, injuries, or other problems with your feet. Tell your health care provider if anything is not healing. Kidney function test (urine microalbumin)  This test is performed once a year.  Type 1 diabetes: The first test is performed 5 years after diagnosis.  Type 2 diabetes: The first test is performed at the time of diagnosis.  A serum creatinine and estimated glomerular filtration rate (eGFR) test is done once a year to assess the level of chronic kidney disease (CKD), if present. Lipid profile (cholesterol, HDL, LDL, triglycerides)  Performed every 5 years for most people.  The goal for LDL is less than 100 mg/dL. If you are at high risk, the goal is less than 70 mg/dL.  The goal for HDL is 40 mg/dL 50 mg/dL for men and 50 mg/dL 60 mg/dL for women. An HDL cholesterol of 60 mg/dL or higher gives some protection against heart disease.  The goal for triglycerides is less than 150 mg/dL. Influenza vaccine, pneumococcal vaccine, and hepatitis B vaccine  The influenza vaccine is recommended yearly.  The pneumococcal vaccine is generally given once in a lifetime. However, there are some instances when  another vaccination is recommended. Check with your health care provider.  The hepatitis B vaccine is also recommended for adults with diabetes. Diabetes self-management education  Education is recommended at diagnosis and ongoing as needed. Treatment plan  Your treatment plan is reviewed at every medical visit. Document Released: 08/15/2009 Document Revised: 06/20/2013 Document Reviewed: 03/20/2013 [facility] Patient Information 2014 [place].

## 2014-05-14 ENCOUNTER — Other Ambulatory Visit: Payer: Self-pay | Admitting: General Practice

## 2014-05-14 LAB — MICROALBUMIN / CREATININE URINE RATIO
Creatinine, Ur: 120.2 mg/dL (ref 15.0–278.0)
MICROALB/CREAT RATIO: 1773.1 mg/g creat — ABNORMAL HIGH (ref 0.0–30.0)
Microalbumin, Urine: 2131.3 ug/mL — ABNORMAL HIGH (ref 0.0–17.0)

## 2014-05-14 LAB — PHOSPHORUS: PHOSPHORUS: 5 mg/dL — AB (ref 2.5–4.5)

## 2014-05-14 LAB — MAGNESIUM: MAGNESIUM: 2.2 mg/dL (ref 1.6–2.6)

## 2014-05-15 ENCOUNTER — Other Ambulatory Visit: Payer: Self-pay | Admitting: Pharmacist

## 2014-05-15 ENCOUNTER — Encounter: Payer: Self-pay | Admitting: Pharmacist

## 2014-05-15 ENCOUNTER — Telehealth: Payer: Self-pay | Admitting: Pharmacist

## 2014-05-15 DIAGNOSIS — R7989 Other specified abnormal findings of blood chemistry: Secondary | ICD-10-CM

## 2014-05-15 NOTE — Telephone Encounter (Signed)
Albumin was elevated as well as phospharus.  She also has elevated Scr and low vitamin D. Discussed with Dr Laurance Flatten about refer to nephrologist and PTH.  Will do both of these.  Patient's husband aware of recommendations.

## 2014-05-21 ENCOUNTER — Other Ambulatory Visit: Payer: Self-pay | Admitting: Pharmacist

## 2014-05-21 MED ORDER — METOPROLOL TARTRATE 25 MG PO TABS: 37.5000 mg | ORAL_TABLET | Freq: Two times a day (BID) | ORAL | Status: DC

## 2014-05-23 ENCOUNTER — Other Ambulatory Visit: Payer: Self-pay | Admitting: Family Medicine

## 2014-05-24 ENCOUNTER — Other Ambulatory Visit: Payer: Self-pay | Admitting: Family Medicine

## 2014-06-03 ENCOUNTER — Other Ambulatory Visit: Payer: Self-pay | Admitting: Family Medicine

## 2014-06-05 ENCOUNTER — Ambulatory Visit (INDEPENDENT_AMBULATORY_CARE_PROVIDER_SITE_OTHER): Payer: PRIVATE HEALTH INSURANCE | Admitting: Family Medicine

## 2014-06-05 ENCOUNTER — Encounter: Payer: Self-pay | Admitting: Family Medicine

## 2014-06-05 ENCOUNTER — Other Ambulatory Visit: Payer: Self-pay | Admitting: Family Medicine

## 2014-06-05 VITALS — BP 172/69 | HR 73 | Temp 97.7°F | Ht 61.5 in | Wt 189.0 lb

## 2014-06-05 DIAGNOSIS — M10072 Idiopathic gout, left ankle and foot: Secondary | ICD-10-CM

## 2014-06-05 DIAGNOSIS — M109 Gout, unspecified: Secondary | ICD-10-CM

## 2014-06-05 MED ORDER — METHYLPREDNISOLONE (PAK) 4 MG PO TABS: ORAL_TABLET | ORAL | Status: DC

## 2014-06-05 NOTE — Progress Notes (Signed)
   Subjective:    Patient ID: [name], female    DOB: 03-15-1936, 78 y.o.   MRN: [id number]  HPI  This 78 y.o. female presents for evaluation of severe pain in left first toe.  She reports no injury.  Review of Systems C/o left first toe pain   No chest pain, SOB, HA, dizziness, vision change, N/V, diarrhea, constipation, dysuria, urinary urgency or frequency, myalgias, arthralgias or rash.  Objective:   Physical Exam  Vital signs noted  Well developed well nourished female.  HEENT - Head atraumatic Normocephalic Respiratory - Lungs CTA bilateral Cardiac - RRR S1 and S2 without murmur  Left first toe with erythema and swelling and tender      Assessment & Plan:  Acute idiopathic gout of left foot Medrol dose pack as directed and Check Uric Acid level Take tramadol 50mg  for pain  Follow up prn  Lysbeth Penner FNP

## 2014-06-06 LAB — URIC ACID: Uric Acid: 10.6 mg/dL — ABNORMAL HIGH (ref 2.5–7.1)

## 2014-06-11 ENCOUNTER — Other Ambulatory Visit: Payer: Self-pay | Admitting: Family Medicine

## 2014-06-11 MED ORDER — ALLOPURINOL 100 MG PO TABS: 100.0000 mg | ORAL_TABLET | Freq: Every day | ORAL | Status: DC

## 2014-06-12 ENCOUNTER — Other Ambulatory Visit: Payer: Self-pay | Admitting: Family Medicine

## 2014-06-12 ENCOUNTER — Encounter: Payer: Self-pay | Admitting: Family Medicine

## 2014-06-19 ENCOUNTER — Encounter: Payer: Self-pay | Admitting: Family Medicine

## 2014-06-24 ENCOUNTER — Ambulatory Visit (INDEPENDENT_AMBULATORY_CARE_PROVIDER_SITE_OTHER): Payer: PRIVATE HEALTH INSURANCE | Admitting: Pharmacist

## 2014-06-24 ENCOUNTER — Encounter: Payer: Self-pay | Admitting: Pharmacist

## 2014-06-24 VITALS — BP 124/68 | HR 66 | Ht 61.5 in | Wt 189.0 lb

## 2014-06-24 DIAGNOSIS — M10072 Idiopathic gout, left ankle and foot: Secondary | ICD-10-CM

## 2014-06-24 DIAGNOSIS — I10 Essential (primary) hypertension: Secondary | ICD-10-CM

## 2014-06-24 DIAGNOSIS — M109 Gout, unspecified: Secondary | ICD-10-CM

## 2014-06-24 MED ORDER — PANTOPRAZOLE SODIUM 40 MG PO TBEC: 40.0000 mg | DELAYED_RELEASE_TABLET | Freq: Two times a day (BID) | ORAL | Status: DC

## 2014-06-24 MED ORDER — METHYLPREDNISOLONE (PAK) 4 MG PO TABS: ORAL_TABLET | ORAL | Status: DC

## 2014-06-24 NOTE — Progress Notes (Signed)
  Chief Complaint:   Chief Complaint  Patient presents with  . Hypertension     Filed Vitals:   06/24/14 1212  BP: 124/68  Pulse: 66   Filed Weights   06/24/14 1212  Weight: 189 lb (85.73 kg)    HPI: Patient is here for evaluation of uncontrolled hypertension.  At last visit 1 month ago BP was elevated and also serum creatine and urine protein were both elevated.  She has been evaluated by nephrologist and it ws determined that these were diabetic nephropathy changes.  Metoprolol was increased to current dose of 32m 1.5 tablets BID.    Osteoporosis - patient is also due to have Prolia injection but due to elevated serum creatine last month will postpone Prolia until can reassess SCR, calcium and magnesium.  Gout - patient was evaluated about 3 weeks ago and given 6 days methylprednisone packet.  She reports that pain is much better but still continues to have pain in left great toe.  She asks if she can have another dose pack.  BG readings did not increase significantly with 1st pack.  She was prescribed allopurinol 1024mqd but was told to hold off on taking.   Nausea - patient reports nausea which occurs mostly in am but also come in pm.  Feels like could be related to reflux.  She is taking omeprazole 2053mid and has done do for several years.   Assessment: 1. HTN - at goal today.  2 Gout 3. Nausea   Recommendations: 1.  Medication recommendations at this time are as follows:    Continue current BP regimen  Medrol dose pack x1 - OK by PCP  Change omeprazole to pantoprazole 56m28md  Start allopurinol 100mg38m 2.  Continue to  Check BP at home - call if continue to get BP readings over 140 / 90. 3.   Orders Placed This Encounter  Procedures  . BMP8+EGFR   4.  RTC in 4-6 weeks to recheck BP, weight and BG     TammyCherre RobinsrmD, CPP

## 2014-06-25 LAB — BMP8+EGFR
BUN/Creatinine Ratio: 11 (ref 11–26)
BUN: 19 mg/dL (ref 8–27)
CALCIUM: 10.1 mg/dL (ref 8.7–10.3)
CHLORIDE: 101 mmol/L (ref 97–108)
CO2: 22 mmol/L (ref 18–29)
CREATININE: 1.8 mg/dL — AB (ref 0.57–1.00)
GFR calc Af Amer: 31 mL/min/{1.73_m2} — ABNORMAL LOW (ref >59)
GFR calc non Af Amer: 27 mL/min/{1.73_m2} — ABNORMAL LOW (ref >59)
Glucose: 108 mg/dL — ABNORMAL HIGH (ref 65–99)
Potassium: 5.2 mmol/L (ref 3.5–5.2)
SODIUM: 140 mmol/L (ref 134–144)

## 2014-06-26 ENCOUNTER — Telehealth: Payer: Self-pay | Admitting: Pharmacist

## 2014-06-26 NOTE — Telephone Encounter (Signed)
Patient took first dose of methylprednisolone yesterday and BG was 516 last pm.   She gave 6 units of humalog and BG was down to 300's .  This morning 330 and gave regular insulin dose.  At 10am was down to 203.   Recommended that she continue to check BG frequently.  She is advised to use sliding scale for Humalog for elevated BG readings while taking methylprednisolone.

## 2014-06-28 ENCOUNTER — Encounter: Payer: Self-pay | Admitting: Pharmacist

## 2014-06-28 ENCOUNTER — Telehealth: Payer: Self-pay | Admitting: Pharmacist

## 2014-06-28 DIAGNOSIS — R7989 Other specified abnormal findings of blood chemistry: Secondary | ICD-10-CM

## 2014-06-28 NOTE — Telephone Encounter (Signed)
Patient notified of lab results.  Serum creatinine is trending up.  She has recently seen Dr Florene Glen  / nephrologist and was determined kidney chages are due to uncontrolled DM.  Recommend recheck BMET in 2- 4 weeks.  Also scheduled flu vaccine appt

## 2014-07-02 ENCOUNTER — Other Ambulatory Visit: Payer: Self-pay | Admitting: Family Medicine

## 2014-07-09 ENCOUNTER — Other Ambulatory Visit: Payer: Self-pay | Admitting: *Deleted

## 2014-07-09 DIAGNOSIS — R109 Unspecified abdominal pain: Secondary | ICD-10-CM

## 2014-07-09 DIAGNOSIS — R112 Nausea with vomiting, unspecified: Secondary | ICD-10-CM

## 2014-07-11 ENCOUNTER — Ambulatory Visit (HOSPITAL_COMMUNITY)
Admission: RE | Admit: 2014-07-11 | Discharge: 2014-07-11 | Disposition: A | Discharge status: Home / Self Care | Payer: Medicare Other | Source: Ambulatory Visit | Attending: Family Medicine | Admitting: Family Medicine

## 2014-07-11 DIAGNOSIS — R1011 Right upper quadrant pain: Secondary | ICD-10-CM | POA: Insufficient documentation

## 2014-07-11 DIAGNOSIS — R112 Nausea with vomiting, unspecified: Secondary | ICD-10-CM | POA: Diagnosis not present

## 2014-07-11 DIAGNOSIS — IMO0002 Reserved for concepts with insufficient information to code with codable children: Secondary | ICD-10-CM | POA: Diagnosis not present

## 2014-07-11 DIAGNOSIS — R109 Unspecified abdominal pain: Secondary | ICD-10-CM

## 2014-07-12 ENCOUNTER — Telehealth: Payer: Self-pay

## 2014-07-12 NOTE — Telephone Encounter (Signed)
Husband aware of CT results;

## 2014-07-12 NOTE — Telephone Encounter (Signed)
Message copied by Koren Bound on Fri Jul 12, 2014  8:45 AM ------      Message from: Chipper Herb      Created: Thu Jul 11, 2014  5:46 PM       As per radiology report------ please call the patient's husband with these results. Please make sure that a copy of this report is sent to the gastroenterologist that will be seeing her because of the continued abdominal pain and nausea ------

## 2014-07-23 ENCOUNTER — Other Ambulatory Visit: Payer: Self-pay | Admitting: Pharmacist

## 2014-07-25 ENCOUNTER — Other Ambulatory Visit: Payer: Self-pay | Admitting: *Deleted

## 2014-07-25 MED ORDER — METOPROLOL TARTRATE 25 MG PO TABS: 37.5000 mg | ORAL_TABLET | Freq: Two times a day (BID) | ORAL | Status: DC

## 2014-07-29 ENCOUNTER — Encounter: Payer: Self-pay | Admitting: Family Medicine

## 2014-07-29 ENCOUNTER — Ambulatory Visit (INDEPENDENT_AMBULATORY_CARE_PROVIDER_SITE_OTHER): Payer: PRIVATE HEALTH INSURANCE | Admitting: Family Medicine

## 2014-07-29 ENCOUNTER — Other Ambulatory Visit: Payer: PRIVATE HEALTH INSURANCE

## 2014-07-29 ENCOUNTER — Ambulatory Visit: Payer: PRIVATE HEALTH INSURANCE

## 2014-07-29 VITALS — BP 196/84 | HR 116 | Temp 97.1°F | Ht 61.5 in | Wt 189.0 lb

## 2014-07-29 DIAGNOSIS — I10 Essential (primary) hypertension: Secondary | ICD-10-CM

## 2014-07-29 DIAGNOSIS — E119 Type 2 diabetes mellitus without complications: Secondary | ICD-10-CM

## 2014-07-29 DIAGNOSIS — N183 Chronic kidney disease, stage 3 unspecified: Secondary | ICD-10-CM

## 2014-07-29 DIAGNOSIS — E785 Hyperlipidemia, unspecified: Secondary | ICD-10-CM

## 2014-07-29 DIAGNOSIS — H6500 Acute serous otitis media, unspecified ear: Secondary | ICD-10-CM

## 2014-07-29 DIAGNOSIS — H6122 Impacted cerumen, left ear: Secondary | ICD-10-CM

## 2014-07-29 DIAGNOSIS — H6120 Impacted cerumen, unspecified ear: Secondary | ICD-10-CM

## 2014-07-29 DIAGNOSIS — N390 Urinary tract infection, site not specified: Secondary | ICD-10-CM

## 2014-07-29 DIAGNOSIS — H6502 Acute serous otitis media, left ear: Secondary | ICD-10-CM

## 2014-07-29 DIAGNOSIS — R7989 Other specified abnormal findings of blood chemistry: Secondary | ICD-10-CM

## 2014-07-29 MED ORDER — AZITHROMYCIN 250 MG PO TABS
ORAL_TABLET | ORAL | Status: DC
Start: 2014-07-29 — End: 2014-08-13

## 2014-07-29 NOTE — Addendum Note (Signed)
Addended by: Earlene Plater on: 07/29/2014 11:04 AM   Modules accepted: Orders

## 2014-07-29 NOTE — Patient Instructions (Addendum)
Periodically, and use ear wax softener in the ear canals for 2 or 3 nights in a row to keep the ear wax softened--Debrox Take blood pressure medication regularly Take antibiotic as directed Use Flonase, over-the-counter 1-2 sprays each nostril daily

## 2014-07-29 NOTE — Progress Notes (Signed)
Subjective:    Patient ID: [name], female    DOB: Apr 16, 1936, 78 y.o.   MRN: [id number]  HPI Pt is here today for bilateral ear pressure (Left greater than Right) that has been going on for one month. The patient's blood pressure is elevated today but she forgot to take her medication this morning. A repeat blood pressure was 162/88.          Patient Active Problem List   Diagnosis Date Noted  . Vitamin D insufficiency 03/29/2013  . Chronic kidney disease 09/14/2011  . CVA (cerebral infarction), past history 09/14/2011  . 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 DIABETES MELLITUS, TYPE II 08/12/2008  . HYPERLIPIDEMIA 08/12/2008  . HYPERTENSION 08/12/2008  . GERD 08/12/2008  . OSTEOARTHRITIS, lumbar spine 08/12/2008  . OSTEOPOROSIS 08/12/2008  . KNEE REPLACEMENT, RIGHT, HX OF 08/12/2008   Outpatient Encounter Prescriptions as of 07/29/2014  Medication Sig  . acetaminophen (TYLENOL) 500 MG tablet Take 1,000 mg by mouth every 6 (six) hours as needed.   Marland Kitchen allopurinol (ZYLOPRIM) 100 MG tablet Take 1 tablet (100 mg total) by mouth daily.  Marland Kitchen amLODipine (NORVASC) 10 MG tablet TAKE 1 TABLET ONCE A DAY  . aspirin 81 MG tablet Take 81 mg by mouth daily.  . B Complex-C-Folic Acid (MULTIVITAMIN, STRESS FORMULA) tablet Take 1 tablet by mouth daily.    Marland Kitchen denosumab (PROLIA) 60 MG/ML SOLN injection Inject 60 mg into the skin every 6 (six) months. Administer in upper arm, thigh, or abdomen  . diazepam (VALIUM) 2 MG tablet TAKE 1 TABLET 1 HOUR BEFORE DENTAL APPOINTMENT  . fluticasone (FLONASE) 50 MCG/ACT nasal spray Place 2 sprays into both nostrils daily.  Marland Kitchen guaiFENesin (MUCINEX) 600 MG 12 hr tablet Take 1,200 mg by mouth 2 (two) times daily.  . insulin lispro (HUMALOG KWIKPEN) 100 UNIT/ML KiwkPen INJECT UP TO 12 UNITS BEFORE LUNCH AS DIRECTED BASED ON BLOOD GLUCOSE  . Insulin Lispro Prot & Lispro (HUMALOG MIX 50/50 KWIKPEN) (50-50) 100 UNIT/ML Kwikpen INJECT 64 UNITS EACH  MORNING AND 38 UNITS EACH EVENING  . Insulin Pen Needle (PEN NEEDLES 31GX5/16") 31G X 8 MM MISC USE AS DIRECTED  . Lancets (ONETOUCH ULTRASOFT) lancets CHECK BLOOD SUGAR 4 TIMES A DAY  . levothyroxine (SYNTHROID, LEVOTHROID) 50 MCG tablet Take 1 tablet (50 mcg total) by mouth daily.  Marland Kitchen loratadine (CLARITIN) 10 MG tablet Take 10 mg by mouth daily as needed for allergies.  . methylPREDNIsolone (MEDROL DOSPACK) 4 MG tablet follow package directions  . metoprolol tartrate (LOPRESSOR) 25 MG tablet Take 1.5 tablets (37.5 mg total) by mouth 2 (two) times daily.  . Multiple Vitamins-Minerals (SENTRY SENIOR) TABS Take 1 tablet by mouth daily.  Marland Kitchen omega-3 acid ethyl esters (LOVAZA) 1 G capsule TAKE (2) CAPSULES TWICE DAILY.  Marland Kitchen omeprazole (PRILOSEC) 20 MG capsule TAKE (1) TABLET TWICE A DAY.  Marland Kitchen ondansetron (ZOFRAN) 8 MG tablet Take 1 tablet (8 mg total) by mouth every 8 (eight) hours as needed for nausea or vomiting.  . ONE TOUCH ULTRA TEST test strip Check BS TID and [id number]  . pantoprazole (PROTONIX) 40 MG tablet Take 1 tablet (40 mg total) by mouth 2 (two) times daily.  . traMADol (ULTRAM) 50 MG tablet Take 1 tablet (50 mg total) by mouth every 6 (six) hours as needed.      Review of Systems  HENT: Positive for ear pain (ear pressure L>R x 1 mth). Negative for ear discharge.   Neurological: Negative for dizziness  and headaches.       Objective:   Physical Exam  Nursing note and vitals reviewed. Constitutional: She is oriented to person, place, and time. She appears well-developed and well-nourished. No distress.  HENT:  Head: Normocephalic and atraumatic.  Right Ear: External ear normal.  Nose: Nose normal.  Mouth/Throat: Oropharynx is clear and moist.  Minimal air cerumen left ear canal near the TM  Eyes: Conjunctivae and EOM are normal. Pupils are equal, round, and reactive to light. Right eye exhibits no discharge. Left eye exhibits no discharge. No scleral icterus.  Neck: Normal  range of motion. Neck supple. No thyromegaly present.  Abdominal: Bowel sounds are normal.  Musculoskeletal: Normal range of motion. She exhibits no edema.  Lymphadenopathy:    She has no cervical adenopathy.  Neurological: She is alert and oriented to person, place, and time.  Skin: Skin is warm and dry. No rash noted.  Psychiatric: She has a normal mood and affect. Her behavior is normal. Judgment and thought content normal.   BP 196/84  Pulse 116  Temp(Src) 97.1 F (36.2 C) (Oral)  Ht 5' 1.5" (1.562 m)  Wt 189 lb (85.73 kg)  BMI 35.14 kg/m2  Repeat blood pressure 162/84  After left ear canal irrigation, patient felt better. There did appear to be some serous otitis media with a layering behind the ear drum    Assessment & Plan:  1. DIABETES MELLITUS, TYPE II  2. HYPERLIPIDEMIA  3. HYPERTENSION  4. CRI (chronic renal insufficiency), stage 3 (moderate)  5. Excessive cerumen in ear canal, left -This was successfully removed with irrigation  6. Serous otitis media -Z-Pak -Flonase one to 2 sprays each nostril at bedtime  Meds ordered this encounter  Medications  . azithromycin (ZITHROMAX) 250 MG tablet    Sig: 2 pills the first day then one daily until completed    Dispense:  6 tablet    Refill:  0      Patient Instructions  Periodically, and use ear wax softener in the ear canals for 2 or 3 nights in a row to keep the ear wax softened--Debrox Take blood pressure medication regularly Take antibiotic as directed Use Flonase, over-the-counter 1-2 sprays each nostril daily    Arrie Senate MD

## 2014-07-30 LAB — BMP8+EGFR
BUN/Creatinine Ratio: 10 — ABNORMAL LOW (ref 11–26)
BUN: 19 mg/dL (ref 8–27)
CO2: 18 mmol/L (ref 18–29)
CREATININE: 1.81 mg/dL — AB (ref 0.57–1.00)
Calcium: 9.4 mg/dL (ref 8.7–10.3)
Chloride: 99 mmol/L (ref 97–108)
GFR calc Af Amer: 31 mL/min/{1.73_m2} — ABNORMAL LOW (ref >59)
GFR, EST NON AFRICAN AMERICAN: 27 mL/min/{1.73_m2} — AB (ref >59)
GLUCOSE: 335 mg/dL — AB (ref 65–99)
Potassium: 4.9 mmol/L (ref 3.5–5.2)
Sodium: 138 mmol/L (ref 134–144)

## 2014-07-30 LAB — PTH, INTACT AND CALCIUM: PTH: 36 pg/mL (ref 15–65)

## 2014-08-13 ENCOUNTER — Encounter: Payer: Self-pay | Admitting: Internal Medicine

## 2014-08-13 ENCOUNTER — Ambulatory Visit: Payer: PRIVATE HEALTH INSURANCE

## 2014-08-13 ENCOUNTER — Telehealth: Payer: Self-pay | Admitting: *Deleted

## 2014-08-13 ENCOUNTER — Ambulatory Visit (INDEPENDENT_AMBULATORY_CARE_PROVIDER_SITE_OTHER): Payer: Medicare Other | Admitting: Internal Medicine

## 2014-08-13 VITALS — BP 148/60 | HR 60 | Ht 61.5 in | Wt 189.2 lb

## 2014-08-13 DIAGNOSIS — R945 Abnormal results of liver function studies: Principal | ICD-10-CM

## 2014-08-13 DIAGNOSIS — R112 Nausea with vomiting, unspecified: Secondary | ICD-10-CM

## 2014-08-13 DIAGNOSIS — R1013 Epigastric pain: Secondary | ICD-10-CM

## 2014-08-13 DIAGNOSIS — R7989 Other specified abnormal findings of blood chemistry: Secondary | ICD-10-CM

## 2014-08-13 DIAGNOSIS — Z932 Ileostomy status: Secondary | ICD-10-CM

## 2014-08-13 DIAGNOSIS — K3184 Gastroparesis: Secondary | ICD-10-CM

## 2014-08-13 MED ORDER — METOCLOPRAMIDE HCL 5 MG PO TABS: ORAL_TABLET | ORAL | Status: DC

## 2014-08-13 NOTE — Progress Notes (Signed)
Patient ID: [name], female   DOB: 1936-09-01, 78 y.o.   MRN: [id number] HPI: [name] is a 78 year old female with a complex medical history who was previously followed by Dr. Verl Blalock who is seen for followup. She is here today with her husband. She has a history of diverticulitis requiring colonic resection, she has an end ileostomy. Her surgery was complicated by MRSA infection along with acute kidney injury requiring hemodialysis for some time. She has a history of now stage III kidney disease, diabetes, hypertension, hyperlipidemia, hypothyroidism, fatty liver. She continues to have issues with sharp and "jabbing" pain which occurs in her right upper quadrant and at times her epigastrium. This pain feels like a "catch" and seems to be worse with movement. It doesn't relate to eating or ostomy output. Her ostomy output has been normal and she denies blood or melena. Episodes of this pain occur at least daily and last approximately 5 minutes. No alleviating factor she is aware of. Separate from the pain she does report issues with nausea and occasional nonbloody nonbilious vomiting. Nausea is worse in the morning and late at night. She denies change in eating or appetite. Denies weight loss. No dysphagia or diet dysphagia. No significant heartburn. She does take pantoprazole 40 mg daily which was changed in the last several months from Prilosec wondering if this may help her pain and it has not made a difference, though it continues to help with heartburn. She also reports issues with bloating and also gas, she notes her bag fills up with gas frequently.  She has recently had CT scan of the abdomen and pelvis.  Earlier in the year she saw Dr. Sharlett Iles again for upper quadrant pain. HIDA scan was ordered and was normal  Past Medical History  Diagnosis Date  . Diverticulitis 2005    Colonoscopy--Dr. Sharlett Iles   . Diabetes mellitus without complication   . Hyperlipidemia   .  Hypertension   . Stroke     Past history  . Hepatic steatosis   . Anemia   . Arthritis   . Chronic kidney disease (CKD)     past dialysis  . CHF (congestive heart failure)   . Status post dilation of esophageal narrowing   . MRSA infection 07/2009    Past Surgical History  Procedure Laterality Date  . Bilateral oophorectomy    . Rotator cuff repair Bilateral   . Carpal tunnel repair Bilateral   . Laparoscopic colostomy      for diverticulitis  . Nasal sinus surgery    . Tonsillectomy    . Replacement total knee Right   . Abdominal hernia repair      with ilestomy    Outpatient Prescriptions Prior to Visit  Medication Sig Dispense Refill  . acetaminophen (TYLENOL) 500 MG tablet Take 1,000 mg by mouth every 6 (six) hours as needed.       Marland Kitchen amLODipine (NORVASC) 10 MG tablet TAKE 1 TABLET ONCE A DAY  90 tablet  0  . aspirin 81 MG tablet Take 81 mg by mouth daily.      . B Complex-C-Folic Acid (MULTIVITAMIN, STRESS FORMULA) tablet Take 1 tablet by mouth daily.        . diazepam (VALIUM) 2 MG tablet TAKE 1 TABLET 1 HOUR BEFORE DENTAL APPOINTMENT  7 tablet  0  . guaiFENesin (MUCINEX) 600 MG 12 hr tablet Take 1,200 mg by mouth 2 (two) times daily.      . insulin lispro (HUMALOG  KWIKPEN) 100 UNIT/ML KiwkPen INJECT UP TO 12 UNITS BEFORE LUNCH AS DIRECTED BASED ON BLOOD GLUCOSE      . Insulin Lispro Prot & Lispro (HUMALOG MIX 50/50 KWIKPEN) (50-50) 100 UNIT/ML Kwikpen INJECT 75 UNITS EACH MORNING AND 38 UNITS EACH EVENING      . Insulin Pen Needle (PEN NEEDLES 31GX5/16") 31G X 8 MM MISC USE AS DIRECTED  100 each  1  . Lancets (ONETOUCH ULTRASOFT) lancets CHECK BLOOD SUGAR 4 TIMES A DAY  100 each  1  . levothyroxine (SYNTHROID, LEVOTHROID) 50 MCG tablet Take 1 tablet (50 mcg total) by mouth daily.  30 tablet  3  . loratadine (CLARITIN) 10 MG tablet Take 10 mg by mouth daily as needed for allergies.      . methylPREDNIsolone (MEDROL DOSPACK) 4 MG tablet follow package directions  21 tablet   0  . metoprolol tartrate (LOPRESSOR) 25 MG tablet Take 1.5 tablets (37.5 mg total) by mouth 2 (two) times daily.  90 tablet  4  . Multiple Vitamins-Minerals (SENTRY SENIOR) TABS Take 1 tablet by mouth daily.      Marland Kitchen omega-3 acid ethyl esters (LOVAZA) 1 G capsule TAKE (2) CAPSULES TWICE DAILY.  120 capsule  4  . ondansetron (ZOFRAN) 8 MG tablet Take 1 tablet (8 mg total) by mouth every 8 (eight) hours as needed for nausea or vomiting.  20 tablet  1  . ONE TOUCH ULTRA TEST test strip Check BS TID and [id number]  100 each  11  . pantoprazole (PROTONIX) 40 MG tablet Take 1 tablet (40 mg total) by mouth 2 (two) times daily.  60 tablet  2  . traMADol (ULTRAM) 50 MG tablet Take 1 tablet (50 mg total) by mouth every 6 (six) hours as needed.  60 tablet  0  . allopurinol (ZYLOPRIM) 100 MG tablet Take 1 tablet (100 mg total) by mouth daily.  30 tablet  6  . azithromycin (ZITHROMAX) 250 MG tablet 2 pills the first day then one daily until completed  6 tablet  0  . denosumab (PROLIA) 60 MG/ML SOLN injection Inject 60 mg into the skin every 6 (six) months. Administer in upper arm, thigh, or abdomen      . fluticasone (FLONASE) 50 MCG/ACT nasal spray Place 2 sprays into both nostrils daily.  16 g  6  . omeprazole (PRILOSEC) 20 MG capsule TAKE (1) TABLET TWICE A DAY.  60 capsule  3   No facility-administered medications prior to visit.    Allergies  Allergen Reactions  . Ace Inhibitors   . Actos [Pioglitazone Hydrochloride]   . Aspirin     REACTION: GI upset  . Codeine     REACTION: hallucinations  . Hydromorphone Hcl   . Metformin And Related   . Penicillins     REACTION: "crazy feeling"  . Procaine Hcl   . Statins Other (See Comments)    myalgias  . Sulfa Antibiotics     Family History  Problem Relation Age of Onset  . Diabetes Mother   . Heart disease Father   . Diabetes Sister     History  Substance Use Topics  . Smoking status: Never Smoker   . Smokeless tobacco: Never Used  .  Alcohol Use: No    ROS: As per history of present illness, otherwise negative  BP 148/60  Pulse 60  Ht 5' 1.5" (1.562 m)  Wt 189 lb 3.2 oz (85.821 kg)  BMI 35.17 kg/m2 Constitutional: Well-developed and well-nourished elderly  female. No distress. HEENT: Normocephalic and atraumatic. Oropharynx is clear and moist. No oropharyngeal exudate. Conjunctivae are normal.  No scleral icterus. Neck: Neck supple. Trachea midline. Cardiovascular: Normal rate, regular rhythm and intact distal pulses.  Pulmonary/chest: Effort normal and breath sounds normal. No wheezing, rales or rhonchi. Abdominal: Soft, nontender, nondistended. Bowel sounds active throughout. Ileostomy left lower quadrant with peristomal hernia draining greenish brown stool. Extensive scarring in the mid abdomen with a wound that appears to have healed by secondary intention Extremities: no clubbing, cyanosis, or edema Neurological: Alert and oriented to person place and time. Skin: Skin is warm and dry. No rashes noted. Psychiatric: Normal mood and affect. Behavior is normal.  RELEVANT LABS AND IMAGING: CBC    Component Value Date/Time   WBC 7.1 05/02/2014 0920   WBC 10.1 05/27/2011 1856   RBC 4.4 05/02/2014 0920   RBC 3.93 05/27/2011 1856   HGB 13.4 05/02/2014 0920   HGB 12.1 05/27/2011 1856   HCT 40.6 05/02/2014 0920   HCT 36.4 05/27/2011 1856   PLT 236 05/27/2011 1856   MCV 93.2 05/02/2014 0920   MCV 92.6 05/27/2011 1856   MCH 30.7 05/02/2014 0920   MCH 30.8 05/27/2011 1856   MCHC 32.9 05/02/2014 0920   MCHC 33.2 05/27/2011 1856   RDW 13.8 05/27/2011 1856   LYMPHSABS 2.3 05/27/2011 1856   MONOABS 1.1* 05/27/2011 1856   EOSABS 0.2 05/27/2011 1856   BASOSABS 0.1 05/27/2011 1856    CMP     Component Value Date/Time   NA 138 07/29/2014 0911   NA 139 03/29/2013 1240   K 4.9 07/29/2014 0911   CL 99 07/29/2014 0911   CO2 18 07/29/2014 0911   GLUCOSE 335* 07/29/2014 0911   GLUCOSE 191* 03/29/2013 1240   BUN 19 07/29/2014 0911   BUN 17  03/29/2013 1240   CREATININE 1.81* 07/29/2014 0911   CREATININE 1.36* 03/29/2013 1240   CALCIUM 9.4 07/29/2014 0911   CALCIUM 8.1* 08/27/2009 1319   PROT 6.9 05/02/2014 0906   PROT 7.8 11/06/2013 0914   ALBUMIN 3.4* 11/06/2013 0914   AST 70* 05/02/2014 0906   ALT 65* 05/02/2014 0906   ALKPHOS 163* 05/02/2014 0906   BILITOT 0.3 05/02/2014 0906   GFRNONAA 27* 07/29/2014 0911   GFRNONAA 38* 03/29/2013 1240   GFRAA 31* 07/29/2014 0911   GFRAA 44* 03/29/2013 1240   Colonoscopy 10/2004 -- diverticulosis, no polyps -- Sharlett Iles  CLINICAL DATA:  Nausea and vomiting and right upper quadrant pain for several months   EXAM: CT ABDOMEN AND PELVIS WITHOUT CONTRAST   TECHNIQUE: Multidetector CT imaging of the abdomen and pelvis was performed following the standard protocol without IV contrast.   COMPARISON:  05/27/2011   FINDINGS: Lung bases are free of acute infiltrate or sizable effusion.   The liver, spleen, gallbladder, adrenal glands and pancreas are within normal limits. Diffuse fatty infiltration of the pancreas is noted. The kidneys are well visualized without evidence of renal calculi or hydronephrotic changes. The ureters are within normal limits bilaterally. The bladder is decompressed.   The appendix is not well visualized. No inflammatory changes to suggest appendicitis are noted. No pelvic mass lesion is noted. A persistent parastomal hernia is noted in the left anterior abdominal wall. No incarceration is identified. No free pelvic fluid is seen. No significant lymphadenopathy is noted. Diffuse aortoiliac calcifications are noted. Stable compression deformity of T12 is noted. No other focal bony abnormality is seen.   IMPRESSION: Stable parastomal hernia on the left.  No acute abnormality is seen.     Electronically Signed   By: Inez Catalina M.D.   On: 07/11/2014 14:48  NUCLEAR MEDICINE GASTRIC EMPTYING STUDY -- 2010  Technique: After oral ingestion of radiolabeled meal,  sequential  abdominal images were obtained for 120 minutes. Residual  percentage of activity remaining within the stomach was calculated  at 60 and 120 minutes.  Radiopharmaceutical: 2 mCi of technetium 61m sulfur colloid  Comparison: None  Findings: At 60 minutes no emptying of the stomach has occurred  with 100% retention of activity. At 120 minutes 80% of activity  remained within the stomach. The normal value is 30% or less  activity within the stomach at 120 minutes.  IMPRESSION:  At 120 minutes 80% of activity remained within the stomach. This is  abnormal The normal value is 30% or less activity within the  stomach at 120 minutes.  ASSESSMENT/PLAN: 78 year old female with a complex medical history who was previously followed by Dr. Verl Blalock who is seen for followup.  1. Epigastric/RUQ abd pain/nausea/vomiting -- the differential for her symptoms includes gastroparesis, which is certainly possible in the setting of diabetes. This could be nausea and vomiting and potentially the upper abdominal discomfort. She recalls prior gastric emptying study and on review of records she did have abnormal gastric emptying study in 2010 (80% of ingested material remained in the stomach at 120 minutes which is markedly abnormal).  Other possible causes such as ulcer disease/gastroduodenitis her felt more unlikely given that she has been maintained on PPI therapy. Her abdominal pain which comes and goes and lasts for a short period of time is more difficult to understand. Adhesive disease given her multiple abdominal surgeries is certainly possible. Liver pain is also thought unlikely given the sporadic nature to the pain. She previously had gallbladder evaluation with HIDA scan earlier this year which was normal. --My recommendation is to begin metoclopramide 5 mg in the morning and at bedtime to help with gastric emptying and also nausea/vomiting --I recommended an upper GI series with small bowel  follow-through to exclude stricture or obvious small bowel abnormality --Have also recommended probiotic with Align 1 capsule daily to help with gas and bloating --she will continue once daily PPI for her history of GERD --We did discuss upper endoscopy and the associated risk. We elected for radiology evaluation as above rather than endoscopy at this time which she understands and agrees with.  2.  elevated liver enzymes -- predominantly AST and ALT but also alkaline phosphatase. Her liver was grossly unremarkable on recent noncontrasted CT, though she is certainly at risk for fatty liver disease. Nonalcoholic fatty liver steatohepatitis may very well explain her small elevation in AST and ALT though complete evaluation has not been performed. There is no evidence for cirrhosis or chronic liver disease. Her liver enzymes have been elevated since at least 2012 but were normal in 2010. I have recommended further evaluation with ANA, IgG, viral hepatitis studies, iron studies, AMA, and ASMA.  Followup in 8 weeks.

## 2014-08-13 NOTE — Telephone Encounter (Signed)
[name], pt needs to return for the following labs: ANA, IgG, hep A total, hep B surface antigen and antibody, hep B core total ab, hep c ab, IBC + ferritin, AMA, and ASMA, ceruloplasmin, hepatic function panel                  [name]

## 2014-08-13 NOTE — Patient Instructions (Addendum)
You have been scheduled for an Upper GI Series and Small Bowel Follow Thru at [facility]. Your appointment is on 08/20/2014 at 10:30am. Please arrive 15 minutes prior to your test for registration. Make certain not to have anything to eat or drink after midnight on the night before your test. If you need to reschedule, please contact radiology at [phone]. --------------------------------------------------------------------------------------------------------------- An upper GI series uses x rays to help diagnose problems of the upper GI tract, which includes the esophagus, stomach, and duodenum. The duodenum is the first part of the small intestine. An upper GI series is conducted by a radiology technologist or a radiologist-a doctor who specializes in x-ray imaging-at a hospital or outpatient center. While sitting or standing in front of an x-ray machine, the patient drinks barium liquid, which is often white and has a chalky consistency and taste. The barium liquid coats the lining of the upper GI tract and makes signs of disease show up more clearly on x rays. X-ray video, called fluoroscopy, is used to view the barium liquid moving through the esophagus, stomach, and duodenum. Additional x rays and fluoroscopy are performed while the patient lies on an x-ray table. To fully coat the upper GI tract with barium liquid, the technologist or radiologist may press on the abdomen or ask the patient to change position. Patients hold still in various positions, allowing the technologist or radiologist to take x rays of the upper GI tract at different angles. If a technologist conducts the upper GI series, a radiologist will later examine the images to look for problems.  This test typically takes about 1 hour to complete --------------------------------------------------------------------------------------------------------------------------------------------- The Small Bowel Follow Thru examination is used to  visualize the entire small bowel (intestines); specifically the connection between the small and large intestine. You will be positioned on a flat x-ray table and an image of your abdomen taken. Then the technologist will show the x-ray to the radiologist. The radiologist will instruct your technologist how much (1-2 cups) barium sulfate you will drink and when to begin taking the timed x-rays, usually 15-30 minutes after you begin drinking. Barium is a harmless substance that will highlight your small intestine by absorbing x-ray. The taste is chalky and it feels very heavy both in the cup and in your stomach.  After the first x-ray is taken and shown to the radiologist, he/she will determine when the next image is to be taken. This is repeated until the barium has reached the end of the small intestine and enters the beginning of the colon (cecum). At such time when the barium spills into the colon, you will be positioned on the x-ray table once again. The radiologist will use a fluoroscopic camera to take some detailed pictures of the connection between your small intestine and colon. The fluoroscope is an x-ray unit that works with a television/computer screen. The radiologist will apply pressure to your abdomen with his/her hand and a lead glove, a plastic paddle, or a paddle with an inflated rubber balloon on the end. This is to spread apart your loops of intestine so he/she can see all areas.   This test typically takes around 1 hour to complete.   Drink plenty of water (8-10 cups/day) for a few days following the procedure to avoid constipation and blockage. The barium will make your stools white for a few days. -------------------------------------------------------    We are sending in a prescription of Reglan to your pharmacy Follow up in 8 weeks  10/11/2014 at 9am  We are giving you Align samples today

## 2014-08-13 NOTE — Telephone Encounter (Signed)
Message copied by Oda Kilts on Tue Aug 13, 2014  5:05 PM ------      Message from: Jerene Bears      Created: Tue Aug 13, 2014 12:48 PM       Shirlean Mylar, pt needs to return for the following labs:  ANA, IgG, hep A total, hep B surface antigen and antibody, hep B core total ab, hep c ab, IBC + ferritin, AMA, and ASMA, ceruloplasmin, hepatic function panel ------

## 2014-08-14 ENCOUNTER — Encounter: Payer: Self-pay | Admitting: Internal Medicine

## 2014-08-14 ENCOUNTER — Encounter: Payer: Self-pay | Admitting: Pharmacist

## 2014-08-14 ENCOUNTER — Other Ambulatory Visit (INDEPENDENT_AMBULATORY_CARE_PROVIDER_SITE_OTHER): Payer: Medicare Other

## 2014-08-14 ENCOUNTER — Other Ambulatory Visit: Payer: Self-pay | Admitting: Pharmacist

## 2014-08-14 ENCOUNTER — Telehealth: Payer: Self-pay | Admitting: Pharmacist

## 2014-08-14 DIAGNOSIS — R7989 Other specified abnormal findings of blood chemistry: Secondary | ICD-10-CM

## 2014-08-14 DIAGNOSIS — M810 Age-related osteoporosis without current pathological fracture: Secondary | ICD-10-CM

## 2014-08-14 DIAGNOSIS — R945 Abnormal results of liver function studies: Principal | ICD-10-CM

## 2014-08-14 LAB — FERRITIN: Ferritin: 122.2 ng/mL (ref 10.0–291.0)

## 2014-08-14 LAB — IBC PANEL
IRON: 91 ug/dL (ref 42–145)
SATURATION RATIOS: 24.7 % (ref 20.0–50.0)
TRANSFERRIN: 262.9 mg/dL (ref 212.0–360.0)

## 2014-08-14 NOTE — Telephone Encounter (Signed)
Patient coming in today for more lab work orders are in

## 2014-08-14 NOTE — Telephone Encounter (Signed)
Called patient about lab results from 07/2014 - serum creatinine is stable. PTH was WNL.  Patient's last DEXA 10/2012.   In light of GFR less than 30 and no history of fracture we are going to hold prolia.  Due to recheck BMD in 2 months. Order sent.

## 2014-08-15 ENCOUNTER — Ambulatory Visit (INDEPENDENT_AMBULATORY_CARE_PROVIDER_SITE_OTHER): Payer: PRIVATE HEALTH INSURANCE

## 2014-08-15 ENCOUNTER — Encounter: Payer: Self-pay | Admitting: Internal Medicine

## 2014-08-15 DIAGNOSIS — Z23 Encounter for immunization: Secondary | ICD-10-CM

## 2014-08-15 LAB — HEPATITIS C ANTIBODY: HCV AB: NEGATIVE

## 2014-08-15 LAB — HEPATITIS B CORE ANTIBODY, TOTAL: HEP B C TOTAL AB: NONREACTIVE

## 2014-08-15 LAB — HEPATITIS B SURFACE ANTIGEN: HEP B S AG: NEGATIVE

## 2014-08-15 LAB — ANTI-SMOOTH MUSCLE/MITOCHOND.
Mitochondrial Ab: 6.3 Units (ref 0.0–20.0)
Smooth Muscle Ab: 16 Units (ref 0–19)

## 2014-08-15 LAB — ANA: ANA: NEGATIVE

## 2014-08-15 LAB — HEPATITIS B SURFACE ANTIBODY,QUALITATIVE: Hep B S Ab: NEGATIVE

## 2014-08-15 LAB — HEPATITIS A ANTIBODY, TOTAL: Hep A Total Ab: REACTIVE — AB

## 2014-08-15 LAB — IGG: IgG (Immunoglobin G), Serum: 690 mg/dL (ref 690–1700)

## 2014-08-15 LAB — MITOCHONDRIAL ANTIBODIES: Mitochondrial M2 Ab, IgG: 0.24 (ref <0.91)

## 2014-08-17 ENCOUNTER — Other Ambulatory Visit: Payer: Self-pay | Admitting: Family Medicine

## 2014-08-19 ENCOUNTER — Encounter: Payer: Self-pay | Admitting: Internal Medicine

## 2014-08-19 NOTE — Telephone Encounter (Signed)
With gastroparesis, there can be good and bad days On particularly bad day she can use 10 mg of metoclopramide in the morning and at bedtime, she can also take an additional dose midday if needed for nausea/vomiting Please discuss UGI series and SBFT with the patient/or husband as it relates to her DM2 and diabetes medication

## 2014-08-20 ENCOUNTER — Ambulatory Visit (HOSPITAL_COMMUNITY)
Admission: RE | Admit: 2014-08-20 | Discharge: 2014-08-20 | Disposition: A | Discharge status: Home / Self Care | Payer: Medicare Other | Source: Ambulatory Visit | Attending: Internal Medicine | Admitting: Internal Medicine

## 2014-08-20 DIAGNOSIS — K3184 Gastroparesis: Secondary | ICD-10-CM | POA: Insufficient documentation

## 2014-08-20 DIAGNOSIS — Z932 Ileostomy status: Secondary | ICD-10-CM | POA: Diagnosis not present

## 2014-08-20 DIAGNOSIS — K5710 Diverticulosis of small intestine without perforation or abscess without bleeding: Secondary | ICD-10-CM | POA: Insufficient documentation

## 2014-08-20 DIAGNOSIS — R1032 Left lower quadrant pain: Secondary | ICD-10-CM | POA: Diagnosis present

## 2014-08-20 DIAGNOSIS — R1013 Epigastric pain: Secondary | ICD-10-CM

## 2014-08-20 DIAGNOSIS — K529 Noninfective gastroenteritis and colitis, unspecified: Secondary | ICD-10-CM | POA: Insufficient documentation

## 2014-08-20 DIAGNOSIS — Z9049 Acquired absence of other specified parts of digestive tract: Secondary | ICD-10-CM | POA: Insufficient documentation

## 2014-08-20 DIAGNOSIS — R112 Nausea with vomiting, unspecified: Secondary | ICD-10-CM

## 2014-08-26 ENCOUNTER — Encounter: Payer: Self-pay | Admitting: Family Medicine

## 2014-08-26 ENCOUNTER — Ambulatory Visit (INDEPENDENT_AMBULATORY_CARE_PROVIDER_SITE_OTHER): Payer: PRIVATE HEALTH INSURANCE | Admitting: Family Medicine

## 2014-08-26 VITALS — BP 148/72 | HR 91 | Temp 98.2°F | Wt 189.0 lb

## 2014-08-26 DIAGNOSIS — H60392 Other infective otitis externa, left ear: Secondary | ICD-10-CM

## 2014-08-26 MED ORDER — NEOMYCIN-POLYMYXIN-HC 3.5-10000-1 OT SOLN: 3.0000 [drp] | Freq: Four times a day (QID) | OTIC | Status: DC

## 2014-08-26 NOTE — Progress Notes (Signed)
   Subjective:    Patient ID: [name], female    DOB: 02/18/1936, 78 y.o.   MRN: [id number]  HPI This 78 y.o. female presents for evaluation of uri sx's and left ear discomfort.  She states she did have her ear washed out recently.   She states she was tx'd recently with abx's for URI and ear infection.   Review of Systems No chest pain, SOB, HA, dizziness, vision change, N/V, diarrhea, constipation, dysuria, urinary urgency or frequency, myalgias, arthralgias or rash.     Objective:   Physical Exam Vital signs noted  Well developed well nourished female.  HEENT - Head atraumatic Normocephalic                Eyes - PERRLA, Conjuctiva - clear Sclera- Clear EOMI                Ears - EAC's Wnl TM's Wnl Gross Hearing WNL                Nose - Nares patent                 Throat - oropharanx wnl Respiratory - Lungs CTA bilateral Cardiac - RRR S1 and S2 without murmur GI - Abdomen soft Nontender and bowel sounds active x 4 Extremities - No edema. Neuro - Grossly intact.       Assessment & Plan:  Otitis, externa, infective, left - Plan: neomycin-polymyxin-hydrocortisone (CORTISPORIN) otic solution  Lysbeth Penner FNP

## 2014-09-03 ENCOUNTER — Encounter: Payer: Self-pay | Admitting: Family Medicine

## 2014-09-03 ENCOUNTER — Ambulatory Visit (INDEPENDENT_AMBULATORY_CARE_PROVIDER_SITE_OTHER): Payer: PRIVATE HEALTH INSURANCE | Admitting: Family Medicine

## 2014-09-03 VITALS — BP 150/80 | HR 96 | Temp 97.6°F | Ht 61.5 in | Wt 189.0 lb

## 2014-09-03 DIAGNOSIS — R1013 Epigastric pain: Secondary | ICD-10-CM

## 2014-09-03 DIAGNOSIS — E118 Type 2 diabetes mellitus with unspecified complications: Secondary | ICD-10-CM

## 2014-09-03 DIAGNOSIS — R7989 Other specified abnormal findings of blood chemistry: Secondary | ICD-10-CM

## 2014-09-03 DIAGNOSIS — M2669 Other specified disorders of temporomandibular joint: Secondary | ICD-10-CM

## 2014-09-03 DIAGNOSIS — I10 Essential (primary) hypertension: Secondary | ICD-10-CM

## 2014-09-03 DIAGNOSIS — M26649 Arthritis of unspecified temporomandibular joint: Secondary | ICD-10-CM

## 2014-09-03 DIAGNOSIS — E039 Hypothyroidism, unspecified: Secondary | ICD-10-CM

## 2014-09-03 DIAGNOSIS — N183 Chronic kidney disease, stage 3 unspecified: Secondary | ICD-10-CM

## 2014-09-03 DIAGNOSIS — M810 Age-related osteoporosis without current pathological fracture: Secondary | ICD-10-CM

## 2014-09-03 DIAGNOSIS — E785 Hyperlipidemia, unspecified: Secondary | ICD-10-CM

## 2014-09-03 LAB — POCT CBC
GRANULOCYTE PERCENT: 70.8 % (ref 37–80)
HCT, POC: 36 % — AB (ref 37.7–47.9)
HEMOGLOBIN: 12.2 g/dL (ref 12.2–16.2)
Lymph, poc: 1.5 (ref 0.6–3.4)
MCH, POC: 31.6 pg — AB (ref 27–31.2)
MCHC: 33.7 g/dL (ref 31.8–35.4)
MCV: 93.5 fL (ref 80–97)
MPV: 9.7 fL (ref 0–99.8)
POC Granulocyte: 4.8 (ref 2–6.9)
POC LYMPH PERCENT: 22.4 %L (ref 10–50)
Platelet Count, POC: 222 10*3/uL (ref 142–424)
RBC: 3.9 M/uL — AB (ref 4.04–5.48)
RDW, POC: 14.2 %
WBC: 6.8 10*3/uL (ref 4.6–10.2)

## 2014-09-03 LAB — POCT GLYCOSYLATED HEMOGLOBIN (HGB A1C): Hemoglobin A1C: 8.5

## 2014-09-03 NOTE — Progress Notes (Signed)
Subjective:    Patient ID: [name], female    DOB: 1936/08/27, 78 y.o.   MRN: [id number]  HPI Pt here for follow up and management of chronic medical problems.the patient has appointments pending with gastroenterology for possible future endoscopy. She's having some problems with her left ear. We will continue to try to help her with getting better blood sugar control. She does bring in blood sugars for review today and these are consistently elevated in the 200 range. Her blood pressures are also somewhat elevated with a few down in the 130s over the 60 range.the patient continues to have a lot of back pain and she gets minimal exercise and walking in using her walker around the house. She is continued to be followed by the clinical pharmacists and the nephrologist and making all efforts possible to keep her blood sugar under control.         Patient Active Problem List   Diagnosis Date Noted  . Gastroparesis due to secondary diabetes 08/05/2014  . Vitamin D insufficiency 03/29/2013  . Chronic kidney disease 09/14/2011  . CVA (cerebral infarction), past history 09/14/2011  . 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 DIABETES MELLITUS, TYPE II 08/12/2008  . HYPERLIPIDEMIA 08/12/2008  . HYPERTENSION 08/12/2008  . GERD 08/12/2008  . OSTEOARTHRITIS, lumbar spine 08/12/2008  . Osteoporosis, postmenopausal 08/12/2008  . KNEE REPLACEMENT, RIGHT, HX OF 08/12/2008   Outpatient Encounter Prescriptions as of 09/03/2014  Medication Sig  . acetaminophen (TYLENOL) 500 MG tablet Take 1,000 mg by mouth every 6 (six) hours as needed.   Marland Kitchen amLODipine (NORVASC) 10 MG tablet TAKE 1 TABLET ONCE A DAY  . aspirin 81 MG tablet Take 81 mg by mouth daily.  . B Complex-C-Folic Acid (MULTIVITAMIN, STRESS FORMULA) tablet Take 1 tablet by mouth daily.    Marland Kitchen guaiFENesin (MUCINEX) 600 MG 12 hr tablet Take 1,200 mg by mouth 2 (two) times daily.  . insulin lispro (HUMALOG KWIKPEN) 100 UNIT/ML  KiwkPen INJECT UP TO 12 UNITS BEFORE LUNCH AS DIRECTED BASED ON BLOOD GLUCOSE  . Insulin Lispro Prot & Lispro (HUMALOG MIX 50/50 KWIKPEN) (50-50) 100 UNIT/ML Kwikpen INJECT 4 UNITS EACH MORNING AND 38 UNITS EACH EVENING  . Insulin Pen Needle (PEN NEEDLES 31GX5/16") 31G X 8 MM MISC USE AS DIRECTED  . Lancets (ONETOUCH ULTRASOFT) lancets CHECK BLOOD SUGAR 4 TIMES A DAY  . levothyroxine (SYNTHROID, LEVOTHROID) 50 MCG tablet Take 1 tablet (50 mcg total) by mouth daily.  Marland Kitchen loratadine (CLARITIN) 10 MG tablet Take 10 mg by mouth daily as needed for allergies.  . metoprolol tartrate (LOPRESSOR) 25 MG tablet Take 1.5 tablets (37.5 mg total) by mouth 2 (two) times daily.  . Multiple Vitamins-Minerals (SENTRY SENIOR) TABS Take 1 tablet by mouth daily.  Marland Kitchen omega-3 acid ethyl esters (LOVAZA) 1 G capsule TAKE (2) CAPSULES TWICE DAILY.  Marland Kitchen ondansetron (ZOFRAN) 8 MG tablet Take 1 tablet (8 mg total) by mouth every 8 (eight) hours as needed for nausea or vomiting.  . ONE TOUCH ULTRA TEST test strip Check BS TID and [id number]  . pantoprazole (PROTONIX) 40 MG tablet Take 1 tablet (40 mg total) by mouth 2 (two) times daily.  . traMADol (ULTRAM) 50 MG tablet Take 1 tablet (50 mg total) by mouth every 6 (six) hours as needed.  . [DISCONTINUED] metoCLOPramide (REGLAN) 5 MG tablet Use one in the morning and one at bedtime  . [DISCONTINUED] diazepam (VALIUM) 2 MG tablet TAKE 1 TABLET 1 HOUR BEFORE DENTAL  APPOINTMENT  . [DISCONTINUED] methylPREDNIsolone (MEDROL DOSPACK) 4 MG tablet follow package directions  . [DISCONTINUED] neomycin-polymyxin-hydrocortisone (CORTISPORIN) otic solution Place 3 drops into the left ear 4 (four) times daily.  . [DISCONTINUED] SF 5000 PLUS 1.1 % CREA dental cream     Review of Systems  Constitutional: Negative.   HENT: Negative.        Left ear - recent infection - check today  Eyes: Negative.   Respiratory: Negative.   Cardiovascular: Negative.   Gastrointestinal: Negative.     Endocrine: Negative.   Genitourinary: Negative.   Musculoskeletal: Negative.   Skin: Negative.   Allergic/Immunologic: Negative.   Neurological: Negative.   Hematological: Negative.   Psychiatric/Behavioral: Negative.        Objective:   Physical Exam  Constitutional: She is oriented to person, place, and time. She appears well-developed and well-nourished. No distress.  The patient comes to the visit today with her husband. She is a little bit more quiet than usual. She is alert and relates her medical history fairly well.  HENT:  Head: Normocephalic and atraumatic.  Right Ear: External ear normal.  Left Ear: External ear normal.  Mouth/Throat: Oropharynx is clear and moist.  Nasal congestion bilaterally. Both TMs are normal. Both ear canals are clear. There is some tenderness anterior to the left ear over the TM joint.  Eyes: Conjunctivae and EOM are normal. Pupils are equal, round, and reactive to light. Right eye exhibits no discharge. Left eye exhibits no discharge. No scleral icterus.  Neck: Normal range of motion. Neck supple. No thyromegaly present.  No carotid bruits  Cardiovascular: Normal rate, regular rhythm, normal heart sounds and intact distal pulses.  Exam reveals no gallop and no friction rub.   No murmur heard. At 84/m  Pulmonary/Chest: Effort normal and breath sounds normal. No respiratory distress. She has no wheezes. She has no rales. She exhibits no tenderness.  Abdominal: Soft. Bowel sounds are normal. She exhibits no mass. There is no tenderness. There is no rebound and no guarding.  The abdomen has slight epigastric tenderness and the ileostomy site appears to be functioning normally.  Musculoskeletal: Normal range of motion. She exhibits no edema or tenderness.  The patient does have stiffness in her back and has trouble laying down and sitting up and with standing initially.  Lymphadenopathy:    She has no cervical adenopathy.  Neurological: She is alert  and oriented to person, place, and time. She has normal reflexes. No cranial nerve deficit.  Skin: Skin is warm and dry. No rash noted. No erythema. No pallor.  Psychiatric: She has a normal mood and affect. Her behavior is normal. Judgment and thought content normal.  Nursing note and vitals reviewed.  BP 150/80 mmHg  Pulse 96  Temp(Src) 97.6 F (36.4 C) (Oral)  Ht 5' 1.5" (1.562 m)  Wt 189 lb (85.73 kg)  BMI 35.14 kg/m2        Assessment & Plan:  1. Osteoporosis, postmenopausal - POCT CBC - Vit D  25 hydroxy (rtn osteoporosis monitoring)  2. CRI (chronic renal insufficiency), stage 3 (moderate) - POCT CBC -continue follow-up with nephrologist  3. Essential hypertension, benign - POCT CBC - BMP8+EGFR - Hepatic function panel -continue to monitor blood pressures closely at home  4. Low serum vitamin D - POCT CBC - Vit D  25 hydroxy (rtn osteoporosis monitoring)  5. Hypothyroidism, unspecified hypothyroidism type - POCT CBC - Thyroid Panel With TSH  6. Hyperlipidemia - POCT CBC - NMR,  lipoprofile  7. Type 2 diabetes mellitus with complication - POCT CBC - POCT glycosylated hemoglobin (Hb A1C) -continue follow-up with clinical pharmacy  8. Arthritis of temporomandibular joint -use warm wet compresses as directed  9. Epigastric pain -follow-up with GI for possible endoscopy  Patient Instructions                       Medicare Annual Wellness Visit  [facility] and the medical providers at [facility] strive to bring you the best medical care.  In doing so we not only want to address your current medical conditions and concerns but also to detect new conditions early and prevent illness, disease and health-related problems.    Medicare offers a yearly Wellness Visit which allows our clinical staff to assess your need for preventative services including immunizations, lifestyle education, counseling to decrease risk of preventable diseases  and screening for fall risk and other medical concerns.    This visit is provided free of charge (no copay) for all Medicare recipients. The clinical pharmacists at [facility] have begun to conduct these Wellness Visits which will also include a thorough review of all your medications.    As you primary medical provider recommend that you make an appointment for your Annual Wellness Visit if you have not done so already this year.  You may set up this appointment before you leave today or you may call back ([phone]) and schedule an appointment.  Please make sure when you call that you mention that you are scheduling your Annual Wellness Visit with the clinical pharmacist so that the appointment may be made for the proper length of time.      Continue current medications. Continue good therapeutic lifestyle changes which include good diet and exercise. Fall precautions discussed with patient. If an FOBT was given today- please return it to our front desk. If you are over 65 years old - you may need Prevnar 39 or the adult Pneumonia vaccine.  Flu Shots will be available at our office starting mid- September. Please call and schedule a FLU CLINIC APPOINTMENT.   Use warm wet compresses to the area in front of the left ear and take Tylenol as needed for pain and avoid excessive chewing activity. Study is active physically with walking as possible continue to use her walker so that you do not fall We will call you with your lab work results once those results are available Continue to monitor blood sugars and check your feet regularly Continue follow-up with gastroenterologist as planned for your abdominal pain   Arrie Senate MD

## 2014-09-03 NOTE — Patient Instructions (Addendum)
Medicare Annual Wellness Visit  [facility] and the medical providers at [facility] strive to bring you the best medical care.  In doing so we not only want to address your current medical conditions and concerns but also to detect new conditions early and prevent illness, disease and health-related problems.    Medicare offers a yearly Wellness Visit which allows our clinical staff to assess your need for preventative services including immunizations, lifestyle education, counseling to decrease risk of preventable diseases and screening for fall risk and other medical concerns.    This visit is provided free of charge (no copay) for all Medicare recipients. The clinical pharmacists at [facility] have begun to conduct these Wellness Visits which will also include a thorough review of all your medications.    As you primary medical provider recommend that you make an appointment for your Annual Wellness Visit if you have not done so already this year.  You may set up this appointment before you leave today or you may call back ([phone]) and schedule an appointment.  Please make sure when you call that you mention that you are scheduling your Annual Wellness Visit with the clinical pharmacist so that the appointment may be made for the proper length of time.      Continue current medications. Continue good therapeutic lifestyle changes which include good diet and exercise. Fall precautions discussed with patient. If an FOBT was given today- please return it to our front desk. If you are over 59 years old - you may need Prevnar 39 or the adult Pneumonia vaccine.  Flu Shots will be available at our office starting mid- September. Please call and schedule a FLU CLINIC APPOINTMENT.   Use warm wet compresses to the area in front of the left ear and take Tylenol as needed for pain and avoid excessive chewing activity. Study is active  physically with walking as possible continue to use her walker so that you do not fall We will call you with your lab work results once those results are available Continue to monitor blood sugars and check your feet regularly Continue follow-up with gastroenterologist as planned for your abdominal pain

## 2014-09-04 ENCOUNTER — Other Ambulatory Visit: Payer: Self-pay

## 2014-09-04 DIAGNOSIS — R7989 Other specified abnormal findings of blood chemistry: Secondary | ICD-10-CM

## 2014-09-04 DIAGNOSIS — R945 Abnormal results of liver function studies: Principal | ICD-10-CM

## 2014-09-04 LAB — BMP8+EGFR
BUN / CREAT RATIO: 9 — AB (ref 11–26)
BUN: 17 mg/dL (ref 8–27)
CO2: 19 mmol/L (ref 18–29)
CREATININE: 1.79 mg/dL — AB (ref 0.57–1.00)
Calcium: 9.4 mg/dL (ref 8.7–10.3)
Chloride: 99 mmol/L (ref 97–108)
GFR calc Af Amer: 31 mL/min/{1.73_m2} — ABNORMAL LOW (ref >59)
GFR calc non Af Amer: 27 mL/min/{1.73_m2} — ABNORMAL LOW (ref >59)
GLUCOSE: 281 mg/dL — AB (ref 65–99)
Potassium: 4.3 mmol/L (ref 3.5–5.2)
Sodium: 138 mmol/L (ref 134–144)

## 2014-09-04 LAB — NMR, LIPOPROFILE
CHOLESTEROL: 332 mg/dL — AB (ref 100–199)
HDL CHOLESTEROL BY NMR: 58 mg/dL (ref >39)
HDL Particle Number: 37.6 umol/L (ref >=30.5)
LDL Particle Number: 1472 nmol/L — ABNORMAL HIGH (ref <1000)
LDL Size: 20.2 nm (ref >20.5)
LDL-C: 201 mg/dL — ABNORMAL HIGH (ref 0–99)
LP-IR Score: 54 — ABNORMAL HIGH (ref <=45)
SMALL LDL PARTICLE NUMBER: 214 nmol/L (ref <=527)
Triglycerides by NMR: 366 mg/dL — ABNORMAL HIGH (ref 0–149)

## 2014-09-04 LAB — THYROID PANEL WITH TSH
Free Thyroxine Index: 2.2 (ref 1.2–4.9)
T3 Uptake Ratio: 26 % (ref 24–39)
T4, Total: 8.3 ug/dL (ref 4.5–12.0)
TSH: 3.96 u[IU]/mL (ref 0.450–4.500)

## 2014-09-04 LAB — HEPATIC FUNCTION PANEL
ALBUMIN: 3.6 g/dL (ref 3.5–4.8)
ALK PHOS: 277 IU/L — AB (ref 39–117)
ALT: 80 IU/L — ABNORMAL HIGH (ref 0–32)
AST: 113 IU/L — AB (ref 0–40)
BILIRUBIN TOTAL: 0.4 mg/dL (ref 0.0–1.2)
Bilirubin, Direct: 0.17 mg/dL (ref 0.00–0.40)
TOTAL PROTEIN: 6.6 g/dL (ref 6.0–8.5)

## 2014-09-04 LAB — VITAMIN D 25 HYDROXY (VIT D DEFICIENCY, FRACTURES): VIT D 25 HYDROXY: 15.1 ng/mL — AB (ref 30.0–100.0)

## 2014-09-06 ENCOUNTER — Telehealth: Payer: Self-pay | Admitting: *Deleted

## 2014-09-06 ENCOUNTER — Other Ambulatory Visit: Payer: Self-pay | Admitting: Family Medicine

## 2014-09-06 ENCOUNTER — Telehealth: Payer: Self-pay | Admitting: Hematology and Oncology

## 2014-09-06 ENCOUNTER — Encounter: Payer: Self-pay | Admitting: Hematology and Oncology

## 2014-09-06 NOTE — Telephone Encounter (Signed)
S/W PATIENT AND GAVE NP APPT FOR 11/23 @ 9:45 W/DR. [place].

## 2014-09-06 NOTE — Telephone Encounter (Signed)
Responded to patient's email. Clarified checking in with financial counselor vs registration

## 2014-09-11 ENCOUNTER — Ambulatory Visit (AMBULATORY_SURGERY_CENTER): Payer: Self-pay | Admitting: *Deleted

## 2014-09-11 VITALS — Ht 61.5 in | Wt 190.0 lb

## 2014-09-11 DIAGNOSIS — R1013 Epigastric pain: Secondary | ICD-10-CM

## 2014-09-11 NOTE — Progress Notes (Signed)
Patient denies any allergies to eggs or soy. Patient denies any problems with anesthesia/sedation. Patient denies any oxygen use at home and does not take any diet/weight loss medications. EMMI education assisgned to patient on EGD, this was explained and instructions given to patient. 

## 2014-09-17 ENCOUNTER — Encounter: Payer: Self-pay | Admitting: Internal Medicine

## 2014-09-17 ENCOUNTER — Ambulatory Visit (AMBULATORY_SURGERY_CENTER): Payer: Medicare Other | Admitting: Internal Medicine

## 2014-09-17 VITALS — BP 136/59 | HR 61 | Temp 97.2°F | Resp 17 | Ht 61.5 in | Wt 190.0 lb

## 2014-09-17 DIAGNOSIS — R1013 Epigastric pain: Secondary | ICD-10-CM

## 2014-09-17 DIAGNOSIS — R112 Nausea with vomiting, unspecified: Secondary | ICD-10-CM

## 2014-09-17 DIAGNOSIS — K317 Polyp of stomach and duodenum: Secondary | ICD-10-CM

## 2014-09-17 DIAGNOSIS — R933 Abnormal findings on diagnostic imaging of other parts of digestive tract: Secondary | ICD-10-CM

## 2014-09-17 LAB — GLUCOSE, CAPILLARY
Glucose-Capillary: 281 mg/dL — ABNORMAL HIGH (ref 70–99)
Glucose-Capillary: 236 mg/dL — ABNORMAL HIGH (ref 70–99)

## 2014-09-17 MED ORDER — SODIUM CHLORIDE 0.9 % IV SOLN: 500.0000 mL | INTRAVENOUS | Status: DC

## 2014-09-17 NOTE — Progress Notes (Signed)
Pt. Has ileostomy to LLQ, bag intact.

## 2014-09-17 NOTE — Progress Notes (Signed)
Report to PACU, RN, vss, BBS= Clear.  

## 2014-09-17 NOTE — Progress Notes (Signed)
Called to room to assist during endoscopic procedure.  Patient ID and intended procedure confirmed with present staff. Received instructions for my participation in the procedure from the performing physician.  

## 2014-09-17 NOTE — Op Note (Signed)
[facility]  [address] [place], [address]   ENDOSCOPY PROCEDURE REPORT  PATIENT: Kelicia, Youtz  MR#: [id number] BIRTHDATE: 25-May-1936 , 78  yrs. old GENDER: female ENDOSCOPIST: Jerene Bears, MD REFERRED BY:  Redge Gainer, M.D. PROCEDURE DATE:  09/17/2014 PROCEDURE:  EGD w/ biopsy ASA CLASS:     Class III INDICATIONS:  epigastric pain, nausea, and abnormal UGI series results. MEDICATIONS: Monitored anesthesia care and Propofol 150 mg IV, lidocaine 120 mg IV TOPICAL ANESTHETIC: none  DESCRIPTION OF PROCEDURE: After the risks benefits and alternatives of the procedure were thoroughly explained, informed consent was obtained.  The LB [id number] endoscope was introduced through the mouth and advanced to the second portion of the duodenum , Without limitations.  The instrument was slowly withdrawn as the mucosa was fully examined.   ESOPHAGUS: The mucosa of the esophagus appeared normal.  STOMACH: Multiple sessile polyps measuring 2-10 mm in size were found in the gastric body, gastric fundus, and cardia.  Multiple sampling biopsies were performed using cold forceps.  Most of these polyps are fundic gland in appearance. Several of these polyps were more erythematous appearing and appeared inflammatory in nature. One particular polyp measuring approximately 10 mm in size, located on the lesser curve was biopsied separately and placed into jar 2. No gastric tumor seen.  DUODENUM: The duodenal mucosa showed no abnormalities in the bulb and 2nd part of the duodenum.  Retroflexed views revealed as previously described.     The scope was then withdrawn from the patient and the procedure completed.  COMPLICATIONS: There were no immediate complications.    ENDOSCOPIC IMPRESSION: 1.   The mucosa of the esophagus appeared normal 2.   Multiple sessile polyps measuring 2 mm in size were found in the gastric body, gastric fundus, and cardia; multiple  sampling biopsies were performed 3.   The duodenal mucosa showed no abnormalities in the bulb and 2nd part of the duodenum  RECOMMENDATIONS: 1.  Await biopsy results 2.  Continue current medications  eSigned:  Jerene Bears, MD 09/17/2014 11:03 AM    EZ:MOQHUT Laurance Flatten, MD and The Patient  PATIENT NAME:  Alisi, Lupien MR#: [id number]

## 2014-09-17 NOTE — Patient Instructions (Signed)
YOU HAD AN ENDOSCOPIC PROCEDURE TODAY AT THE [facility]: Refer to the procedure report that was given to you for any specific questions about what was found during the examination.  If the procedure report does not answer your questions, please call your gastroenterologist to clarify.  If you requested that your care partner not be given the details of your procedure findings, then the procedure report has been included in a sealed envelope for you to review at your convenience later.  YOU SHOULD EXPECT: Some feelings of bloating in the abdomen. Passage of more gas than usual.  Walking can help get rid of the air that was put into your GI tract during the procedure and reduce the bloating. If you had a lower endoscopy (such as a colonoscopy or flexible sigmoidoscopy) you may notice spotting of blood in your stool or on the toilet paper. If you underwent a bowel prep for your procedure, then you may not have a normal bowel movement for a few days.  DIET: Your first meal following the procedure should be a light meal and then it is ok to progress to your normal diet.  A half-sandwich or bowl of soup is an example of a good first meal.  Heavy or fried foods are harder to digest and may make you feel nauseous or bloated.  Likewise meals heavy in dairy and vegetables can cause extra gas to form and this can also increase the bloating.  Drink plenty of fluids but you should avoid alcoholic beverages for 24 hours.  ACTIVITY: Your care partner should take you home directly after the procedure.  You should plan to take it easy, moving slowly for the rest of the day.  You can resume normal activity the day after the procedure however you should NOT DRIVE or use heavy machinery for 24 hours (because of the sedation medicines used during the test).    SYMPTOMS TO REPORT IMMEDIATELY: A gastroenterologist can be reached at any hour.  During normal business hours, 8:30 AM to 5:00 PM Monday through Friday,  call [phone].  After hours and on weekends, please call the GI answering service at [phone] who will take a message and have the physician on call contact you.   Following upper endoscopy (EGD)  Vomiting of blood or coffee ground material  New chest pain or pain under the shoulder blades  Painful or persistently difficult swallowing  New shortness of breath  Fever of 100F or higher  Black, tarry-looking stools  FOLLOW UP: If any biopsies were taken you will be contacted by phone or by letter within the next 1-3 weeks.  Call your gastroenterologist if you have not heard about the biopsies in 3 weeks.  Our staff will call the home number listed on your records the next business day following your procedure to check on you and address any questions or concerns that you may have at that time regarding the information given to you following your procedure. This is a courtesy call and so if there is no answer at the home number and we have not heard from you through the emergency physician on call, we will assume that you have returned to your regular daily activities without incident.  SIGNATURES/CONFIDENTIALITY: You and/or your care partner have signed paperwork which will be entered into your electronic medical record.  These signatures attest to the fact that that the information above on your After Visit Summary has been reviewed and is understood.  Full responsibility   of the confidentiality of this discharge information lies with you and/or your care-partner.  Resume medications. 

## 2014-09-18 ENCOUNTER — Telehealth: Payer: Self-pay | Admitting: *Deleted

## 2014-09-18 NOTE — Telephone Encounter (Signed)
  Follow up Call-  Call back number 09/17/2014  Post procedure Call Back phone  # [phone]  Permission to leave phone message Yes     Patient questions:  Do you have a fever, pain , or abdominal swelling? No. Pain Score  0 *  Have you tolerated food without any problems? Yes.    Have you been able to return to your normal activities? Yes.    Do you have any questions about your discharge instructions: Diet   No. Medications  No. Follow up visit  No.  Do you have questions or concerns about your Care? No.  Actions: * If pain score is 4 or above: No action needed, pain <4.  Pt. Stated thank everyone for being so kind.

## 2014-09-19 ENCOUNTER — Other Ambulatory Visit: Payer: Self-pay | Admitting: Family Medicine

## 2014-09-23 ENCOUNTER — Ambulatory Visit (HOSPITAL_BASED_OUTPATIENT_CLINIC_OR_DEPARTMENT_OTHER): Payer: 59

## 2014-09-23 ENCOUNTER — Telehealth: Payer: Self-pay | Admitting: Hematology and Oncology

## 2014-09-23 ENCOUNTER — Encounter: Payer: Self-pay | Admitting: Hematology and Oncology

## 2014-09-23 ENCOUNTER — Telehealth: Payer: Self-pay

## 2014-09-23 ENCOUNTER — Ambulatory Visit (HOSPITAL_BASED_OUTPATIENT_CLINIC_OR_DEPARTMENT_OTHER): Payer: 59 | Admitting: Hematology and Oncology

## 2014-09-23 ENCOUNTER — Ambulatory Visit: Payer: Medicare Other | Admitting: Internal Medicine

## 2014-09-23 VITALS — BP 149/52 | HR 56 | Temp 98.0°F | Resp 21 | Ht 61.5 in | Wt 183.8 lb

## 2014-09-23 DIAGNOSIS — R809 Proteinuria, unspecified: Secondary | ICD-10-CM

## 2014-09-23 DIAGNOSIS — N183 Chronic kidney disease, stage 3 unspecified: Secondary | ICD-10-CM

## 2014-09-23 DIAGNOSIS — N189 Chronic kidney disease, unspecified: Secondary | ICD-10-CM

## 2014-09-23 HISTORY — DX: Proteinuria, unspecified: R80.9

## 2014-09-23 LAB — COMPREHENSIVE METABOLIC PANEL (CC13)
ALT: 65 U/L — ABNORMAL HIGH (ref 0–55)
AST: 89 U/L — ABNORMAL HIGH (ref 5–34)
Albumin: 2.9 g/dL — ABNORMAL LOW (ref 3.5–5.0)
Alkaline Phosphatase: 231 U/L — ABNORMAL HIGH (ref 40–150)
Anion Gap: 12 meq/L — ABNORMAL HIGH (ref 3–11)
BUN: 22.9 mg/dL (ref 7.0–26.0)
CO2: 21 meq/L — ABNORMAL LOW (ref 22–29)
Calcium: 10 mg/dL (ref 8.4–10.4)
Chloride: 103 meq/L (ref 98–109)
Creatinine: 2.2 mg/dL — ABNORMAL HIGH (ref 0.6–1.1)
Glucose: 383 mg/dL — ABNORMAL HIGH (ref 70–140)
Potassium: 5.3 meq/L — ABNORMAL HIGH (ref 3.5–5.1)
Sodium: 136 meq/L (ref 136–145)
Total Bilirubin: 0.34 mg/dL (ref 0.20–1.20)
Total Protein: 7.3 g/dL (ref 6.4–8.3)

## 2014-09-23 LAB — CBC WITH DIFFERENTIAL/PLATELET
BASO%: 0.8 % (ref 0.0–2.0)
BASOS ABS: 0.1 10*3/uL (ref 0.0–0.1)
EOS%: 2.8 % (ref 0.0–7.0)
Eosinophils Absolute: 0.2 10*3/uL (ref 0.0–0.5)
HEMATOCRIT: 37.9 % (ref 34.8–46.6)
HEMOGLOBIN: 12.8 g/dL (ref 11.6–15.9)
LYMPH#: 1.1 10*3/uL (ref 0.9–3.3)
LYMPH%: 17.5 % (ref 14.0–49.7)
MCH: 31.8 pg (ref 25.1–34.0)
MCHC: 33.8 g/dL (ref 31.5–36.0)
MCV: 94.3 fL (ref 79.5–101.0)
MONO#: 0.6 10*3/uL (ref 0.1–0.9)
MONO%: 8.6 % (ref 0.0–14.0)
NEUT#: 4.6 10*3/uL (ref 1.5–6.5)
NEUT%: 70.3 % (ref 38.4–76.8)
PLATELETS: 209 10*3/uL (ref 145–400)
RBC: 4.02 10*6/uL (ref 3.70–5.45)
RDW: 13.6 % (ref 11.2–14.5)
WBC: 6.5 10*3/uL (ref 3.9–10.3)

## 2014-09-23 NOTE — Telephone Encounter (Signed)
pt called to r/s appt..done...pt aware of new d.t °

## 2014-09-23 NOTE — Progress Notes (Signed)
Checked in new pt with no financial concerns at this time.  Pt informed me that she is here for a hematology concern so financial assistance may not be needed but she has [facility]'s card for any questions or concerns.

## 2014-09-23 NOTE — Progress Notes (Signed)
[place] CONSULT NOTE  Patient Care Team: Chipper Herb, MD as PCP - General (Family Medicine)  CHIEF COMPLAINTS/PURPOSE OF CONSULTATION:  Proteinuria, chronic kidney disease and anemia  HISTORY OF PRESENTING ILLNESS:  [name] 78 y.o. female is here because of proteinuria, chronic kidney disease, and anemia, concern for monoclonal paraproteinemia She denies history of abnormal bone pain or bone fracture. She has chronic back pain from degenerative arthritis.  Patient denies history of recurrent infection or atypical infections such as shingles of meningitis. Denies chills, night sweats, anorexia or abnormal weight loss. The patient has poorly controlled diabetes. She had renal dysfunction many years ago and was on temporary hemodialysis. The patient have colostomy long-term.  MEDICAL HISTORY:  Past Medical History  Diagnosis Date  . Diverticulitis 2005    Colonoscopy--Dr. Sharlett Iles   . Diabetes mellitus without complication   . Hyperlipidemia   . Hypertension   . Stroke     Past history  . Hepatic steatosis   . Anemia   . Arthritis   . Chronic kidney disease (CKD)     past dialysis  . CHF (congestive heart failure)   . Status post dilation of esophageal narrowing   . MRSA infection 07/2009  . Proteinuria 09/23/2014    SURGICAL HISTORY: Past Surgical History  Procedure Laterality Date  . Bilateral oophorectomy    . Rotator cuff repair Bilateral   . Carpal tunnel repair Bilateral   . Laparoscopic colostomy      for diverticulitis  . Nasal sinus surgery    . Tonsillectomy    . Replacement total knee Right   . Abdominal hernia repair      with ilestomy  . Partial colectomy      SOCIAL HISTORY: History   Social History  . Marital Status: Married    Spouse Name: N/A    Number of Children: 1  . Years of Education: N/A   Occupational History  . retired    Social History Main Topics  . Smoking status: Never Smoker   . Smokeless  tobacco: Never Used  . Alcohol Use: No  . Drug Use: No  . Sexual Activity: Not on file   Other Topics Concern  . Not on file   Social History Narrative    FAMILY HISTORY: Family History  Problem Relation Age of Onset  . Diabetes Mother   . Heart disease Father   . Diabetes Sister   . Colon cancer Neg Hx     ALLERGIES:  is allergic to ace inhibitors; actos; aspirin; codeine; hydromorphone hcl; metformin and related; penicillins; procaine hcl; statins; and sulfa antibiotics.  MEDICATIONS:  Current Outpatient Prescriptions  Medication Sig Dispense Refill  . acetaminophen (TYLENOL) 500 MG tablet Take 1,000 mg by mouth every 6 (six) hours as needed.     Marland Kitchen amLODipine (NORVASC) 10 MG tablet TAKE 1 TABLET ONCE A DAY 90 tablet 0  . aspirin 81 MG tablet Take 81 mg by mouth daily.    . B Complex-C-Folic Acid (MULTIVITAMIN, STRESS FORMULA) tablet Take 1 tablet by mouth daily.      Marland Kitchen guaiFENesin (MUCINEX) 600 MG 12 hr tablet Take 1,200 mg by mouth 2 (two) times daily.    . insulin lispro (HUMALOG KWIKPEN) 100 UNIT/ML KiwkPen INJECT UP TO 12 UNITS BEFORE LUNCH AS DIRECTED BASED ON BLOOD GLUCOSE    . Insulin Lispro Prot & Lispro (HUMALOG MIX 50/50 KWIKPEN) (50-50) 100 UNIT/ML Kwikpen INJECT 64 UNITS EACH MORNING AND 38 UNITS  EACH EVENING    . Insulin Pen Needle (PEN NEEDLES 31GX5/16") 31G X 8 MM MISC USE AS DIRECTED 100 each 1  . Lancets (ONETOUCH ULTRASOFT) lancets CHECK BLOOD SUGAR 4 TIMES A DAY 100 each 1  . levothyroxine (SYNTHROID, LEVOTHROID) 50 MCG tablet TAKE 1 TABLET DAILY 30 tablet 11  . loratadine (CLARITIN) 10 MG tablet Take 10 mg by mouth daily as needed for allergies.    . metoprolol tartrate (LOPRESSOR) 25 MG tablet Take 1.5 tablets (37.5 mg total) by mouth 2 (two) times daily. 90 tablet 4  . Multiple Vitamins-Minerals (SENTRY SENIOR) TABS Take 1 tablet by mouth daily.    Marland Kitchen omega-3 acid ethyl esters (LOVAZA) 1 G capsule TAKE (2) CAPSULES TWICE DAILY. 120 capsule 5  .  ondansetron (ZOFRAN) 8 MG tablet Take 1 tablet (8 mg total) by mouth every 8 (eight) hours as needed for nausea or vomiting. 20 tablet 1  . ONE TOUCH ULTRA TEST test strip Check BS TID and PRN.DX.250.0 100 each 11  . pantoprazole (PROTONIX) 40 MG tablet Take 1 tablet (40 mg total) by mouth 2 (two) times daily. 60 tablet 2  . traMADol (ULTRAM) 50 MG tablet Take 1 tablet (50 mg total) by mouth every 6 (six) hours as needed. 60 tablet 0   No current facility-administered medications for this visit.    REVIEW OF SYSTEMS:   Eyes: Denies blurriness of vision, double vision or watery eyes Ears, nose, mouth, throat, and face: Denies mucositis or sore throat Respiratory: Denies cough, dyspnea or wheezes Cardiovascular: Denies palpitation, chest discomfort or lower extremity swelling Gastrointestinal:  Denies nausea, heartburn or change in bowel habits Skin: Denies abnormal skin rashes Lymphatics: Denies new lymphadenopathy or easy bruising Neurological:Denies numbness, tingling or new weaknesses Behavioral/Psych: Mood is stable, no new changes  All other systems were reviewed with the patient and are negative.  PHYSICAL EXAMINATION: ECOG PERFORMANCE STATUS: 2 - Symptomatic, <50% confined to bed  Filed Vitals:   09/23/14 0947  BP: 149/52  Pulse: 56  Temp: 98 F (36.7 C)  Resp: 21   Filed Weights   09/23/14 0947  Weight: 183 lb 12.8 oz (83.371 kg)    GENERAL:alert, no distress and comfortable. She is morbidly obese SKIN: skin color, texture, turgor are normal, no rashes or significant lesions EYES: normal, conjunctiva are pink and non-injected, sclera clear OROPHARYNX:no exudate, no erythema and lips, buccal mucosa, and tongue normal  NECK: supple, thyroid normal size, non-tender, without nodularity LYMPH:  no palpable lymphadenopathy in the cervical, axillary or inguinal LUNGS: clear to auscultation and percussion with normal breathing effort HEART: regular rate & rhythm and no  murmurs and no lower extremity edema ABDOMEN:abdomen soft, non-tender and normal bowel sounds. Colostomy in situ. Musculoskeletal:no cyanosis of digits and no clubbing  PSYCH: alert & oriented x 3 with fluent speech NEURO: no focal motor/sensory deficits  LABORATORY DATA:  I have reviewed the data as listed Lab Results  Component Value Date   WBC 6.5 09/23/2014   HGB 12.8 09/23/2014   HCT 37.9 09/23/2014   MCV 94.3 09/23/2014   PLT 209 09/23/2014    ASSESSMENT & PLAN:  Proteinuria I suspect her proteinuria is not related to monoclonal paraproteinemia, rather, likely related to poorly controlled diabetes. I will order additional workup to exclude monoclonal paraproteinemia.  Chronic kidney disease This is likely due to diabetic nephropathy.     Orders Placed This Encounter  Procedures  . DG Bone Survey Met    Standing Status: Future  Number of Occurrences:      Standing Expiration Date: 11/23/2015    Order Specific Question:  Reason for Exam (SYMPTOM  OR DIAGNOSIS REQUIRED)    Answer:  staging myeloma    Order Specific Question:  Preferred imaging location?    Answer:  [facility]  . Comprehensive metabolic panel    Standing Status: Future     Number of Occurrences: 1     Standing Expiration Date: 11/23/2015  . CBC with Differential    Standing Status: Future     Number of Occurrences: 1     Standing Expiration Date: 11/23/2015  . SPEP & IFE with QIG    Standing Status: Future     Number of Occurrences: 1     Standing Expiration Date: 11/23/2015  . Kappa/lambda light chains    Standing Status: Future     Number of Occurrences: 1     Standing Expiration Date: 11/23/2015  . Beta 2 microglobulin, serum    Standing Status: Future     Number of Occurrences: 1     Standing Expiration Date: 11/23/2015  . IFE, Urine (with Tot Prot)    Standing Status: Future     Number of Occurrences: 1     Standing Expiration Date: 11/23/2015    All questions were answered.  The patient knows to call the clinic with any problems, questions or concerns. I spent 30 minutes counseling the patient face to face. The total time spent in the appointment was 55 minutes and more than 50% was on counseling.     [facility], [place], MD 09/23/2014 1:07 PM

## 2014-09-23 NOTE — Assessment & Plan Note (Signed)
I suspect her proteinuria is not related to monoclonal paraproteinemia, rather, likely related to poorly controlled diabetes. I will order additional workup to exclude monoclonal paraproteinemia.

## 2014-09-23 NOTE — Assessment & Plan Note (Signed)
This is likely due to diabetic nephropathy.

## 2014-09-23 NOTE — Telephone Encounter (Signed)
Pt aware.

## 2014-09-23 NOTE — Telephone Encounter (Signed)
-----   Message from Maury Dus, RN sent at 09/04/2014  3:03 PM EST ----- Regarding: Labs Needs labs in 3 weeks, orders in epic.

## 2014-09-23 NOTE — Telephone Encounter (Signed)
gv and printed appt sched and avs fo rpt for DEC...sent back to lab

## 2014-09-24 ENCOUNTER — Encounter: Payer: Self-pay | Admitting: Internal Medicine

## 2014-09-24 ENCOUNTER — Other Ambulatory Visit (INDEPENDENT_AMBULATORY_CARE_PROVIDER_SITE_OTHER): Payer: 59

## 2014-09-24 DIAGNOSIS — Z79899 Other long term (current) drug therapy: Secondary | ICD-10-CM

## 2014-09-24 DIAGNOSIS — R945 Abnormal results of liver function studies: Principal | ICD-10-CM

## 2014-09-24 DIAGNOSIS — R7989 Other specified abnormal findings of blood chemistry: Secondary | ICD-10-CM

## 2014-09-24 DIAGNOSIS — Z5181 Encounter for therapeutic drug level monitoring: Secondary | ICD-10-CM

## 2014-09-24 LAB — HEPATIC FUNCTION PANEL
ALK PHOS: 202 U/L — AB (ref 39–117)
ALT: 68 U/L — ABNORMAL HIGH (ref 0–35)
AST: 101 U/L — AB (ref 0–37)
Albumin: 3.3 g/dL — ABNORMAL LOW (ref 3.5–5.2)
Bilirubin, Direct: 0 mg/dL (ref 0.0–0.3)
Total Bilirubin: 0.4 mg/dL (ref 0.2–1.2)
Total Protein: 7.3 g/dL (ref 6.0–8.3)

## 2014-09-24 LAB — PROTIME-INR
INR: 1 ratio (ref 0.8–1.0)
Prothrombin Time: 11 s (ref 9.6–13.1)

## 2014-09-25 LAB — SPEP & IFE WITH QIG
ALBUMIN ELP: 49.3 % — AB (ref 55.8–66.1)
ALPHA-2-GLOBULIN: 17.5 % — AB (ref 7.1–11.8)
Alpha-1-Globulin: 4.4 % (ref 2.9–4.9)
Beta 2: 10.2 % — ABNORMAL HIGH (ref 3.2–6.5)
Beta Globulin: 7 % (ref 4.7–7.2)
Gamma Globulin: 11.6 % (ref 11.1–18.8)
IGM, SERUM: 138 mg/dL (ref 52–322)
IgA: 526 mg/dL — ABNORMAL HIGH (ref 69–380)
IgG (Immunoglobin G), Serum: 768 mg/dL (ref 690–1700)
M-Spike, %: 0.48 g/dL
Total Protein, Serum Electrophoresis: 6.8 g/dL (ref 6.0–8.3)

## 2014-09-25 LAB — KAPPA/LAMBDA LIGHT CHAINS
KAPPA FREE LGHT CHN: 4.19 mg/dL — AB (ref 0.33–1.94)
KAPPA LAMBDA RATIO: 1.96 — AB (ref 0.26–1.65)
Lambda Free Lght Chn: 2.14 mg/dL (ref 0.57–2.63)

## 2014-09-25 LAB — BETA 2 MICROGLOBULIN, SERUM: Beta-2 Microglobulin: 6.23 mg/L — ABNORMAL HIGH (ref <=2.51)

## 2014-09-27 ENCOUNTER — Telehealth: Payer: Self-pay | Admitting: Pharmacist

## 2014-09-27 ENCOUNTER — Other Ambulatory Visit: Payer: Self-pay | Admitting: Family Medicine

## 2014-09-27 NOTE — Telephone Encounter (Signed)
Called patient to follow up on BG control / diabetes.   Am readings in 140's to 200's Lunchtime 160's to 200's Evening readings 150's to 300.  Recommended Increase Humalog 50/50 mix to 66 units qam and 42 units each evening.

## 2014-10-02 ENCOUNTER — Encounter: Payer: Medicare Other | Admitting: Internal Medicine

## 2014-10-02 LAB — KAPPA/LAMBDA LIGHT CHAINS, FREE, WITH RATIO, 24HR. URINE
KAPPA LIGHT CHAIN, FREE U: 167 mg/L — AB (ref 1.35–24.19)
Kappa/Lambda, Free Ratio: 10.12 (ref 2.04–10.37)
LAMBDA LIGHT CHAIN, FREE U: 16.5 mg/L — AB (ref 0.24–6.66)

## 2014-10-03 ENCOUNTER — Ambulatory Visit (HOSPITAL_COMMUNITY)
Admission: RE | Admit: 2014-10-03 | Discharge: 2014-10-03 | Disposition: A | Discharge status: Home / Self Care | Payer: 59 | Source: Ambulatory Visit | Attending: Hematology and Oncology | Admitting: Hematology and Oncology

## 2014-10-03 DIAGNOSIS — N183 Chronic kidney disease, stage 3 unspecified: Secondary | ICD-10-CM

## 2014-10-03 DIAGNOSIS — Z01818 Encounter for other preprocedural examination: Secondary | ICD-10-CM | POA: Diagnosis present

## 2014-10-03 DIAGNOSIS — M5032 Other cervical disc degeneration, mid-cervical region: Secondary | ICD-10-CM | POA: Insufficient documentation

## 2014-10-03 DIAGNOSIS — M5137 Other intervertebral disc degeneration, lumbosacral region: Secondary | ICD-10-CM | POA: Insufficient documentation

## 2014-10-03 DIAGNOSIS — R809 Proteinuria, unspecified: Secondary | ICD-10-CM

## 2014-10-03 DIAGNOSIS — C9000 Multiple myeloma not having achieved remission: Secondary | ICD-10-CM | POA: Diagnosis present

## 2014-10-04 LAB — UIFE/LIGHT CHAINS/TP QN, 24-HR UR
ALBUMIN, U: DETECTED
Alpha 1, Urine: DETECTED — AB
Alpha 2, Urine: DETECTED — AB
Beta, Urine: DETECTED — AB
GAMMA UR: DETECTED — AB
TIME-UPE24: 24 h
TOTAL PROTEIN, URINE-UPE24: 192 mg/dL — AB (ref 5–24)
Total Protein, Urine-Ur/day: 1728 mg/d — ABNORMAL HIGH (ref <150)
Volume, Urine: 900 mL

## 2014-10-08 ENCOUNTER — Ambulatory Visit: Payer: 59 | Admitting: Hematology and Oncology

## 2014-10-08 ENCOUNTER — Other Ambulatory Visit: Payer: Self-pay | Admitting: Family Medicine

## 2014-10-10 ENCOUNTER — Telehealth: Payer: Self-pay | Admitting: Hematology and Oncology

## 2014-10-10 ENCOUNTER — Ambulatory Visit (HOSPITAL_BASED_OUTPATIENT_CLINIC_OR_DEPARTMENT_OTHER): Payer: 59 | Admitting: Hematology and Oncology

## 2014-10-10 ENCOUNTER — Encounter: Payer: Self-pay | Admitting: Hematology and Oncology

## 2014-10-10 VITALS — BP 159/56 | HR 66 | Temp 98.3°F | Resp 18 | Ht 61.5 in | Wt 191.6 lb

## 2014-10-10 DIAGNOSIS — N183 Chronic kidney disease, stage 3 unspecified: Secondary | ICD-10-CM

## 2014-10-10 DIAGNOSIS — D472 Monoclonal gammopathy: Secondary | ICD-10-CM | POA: Insufficient documentation

## 2014-10-10 HISTORY — DX: Monoclonal gammopathy: D47.2

## 2014-10-10 NOTE — Telephone Encounter (Signed)
Gave avs & cal for June 2016. °

## 2014-10-10 NOTE — Assessment & Plan Note (Signed)
I discussed with her the natural history of MGUS. Her current condition with renal failure is not associated with MGUS. The detectable M spike is of small amount and she has no evidence of anemia or hypercalcemia. Skeletal survey show chronic degenerative disease and old compression fractures that are not related to this. I recommend repeat bloodwork and follow-up next year.

## 2014-10-10 NOTE — Assessment & Plan Note (Signed)
This is likely due to diabetic nephropathy. She will continue close follow-up with her nephrologist.

## 2014-10-10 NOTE — Progress Notes (Signed)
[place] OFFICE PROGRESS NOTE  Patient Care Team: Chipper Herb, MD as PCP - General (Family Medicine) Estanislado Emms, MD as Consulting Physician (Nephrology) Heath Lark, MD as Consulting Physician (Hematology and Oncology)  SUMMARY OF ONCOLOGIC HISTORY: [name] 78 y.o. female is here because of proteinuria, chronic kidney disease, and anemia, concern for monoclonal paraproteinemia She denies history of abnormal bone pain or bone fracture. She has chronic back pain from degenerative arthritis.  Patient denies history of recurrent infection or atypical infections such as shingles of meningitis. Denies chills, night sweats, anorexia or abnormal weight loss. The patient has poorly controlled diabetes. She had renal dysfunction many years ago and was on temporary hemodialysis. The patient have colostomy long-term. She was seen in November 2015 with extensive workup which showed she have IgA kappa MGUS. She is observed. INTERVAL HISTORY: Please see below for problem oriented charting. She returns to review test results. She has no new symptoms.  REVIEW OF SYSTEMS:   All other systems were reviewed with the patient and are negative.  I have reviewed the past medical history, past surgical history, social history and family history with the patient and they are unchanged from previous note.  ALLERGIES:  is allergic to ace inhibitors; actos; aspirin; codeine; hydromorphone hcl; metformin and related; penicillins; procaine hcl; statins; and sulfa antibiotics.  MEDICATIONS:  Current Outpatient Prescriptions  Medication Sig Dispense Refill  . acetaminophen (TYLENOL) 500 MG tablet Take 1,000 mg by mouth every 6 (six) hours as needed.     Marland Kitchen amLODipine (NORVASC) 10 MG tablet TAKE 1 TABLET ONCE A DAY 90 tablet 0  . aspirin 81 MG tablet Take 81 mg by mouth daily.    . B Complex-C-Folic Acid (MULTIVITAMIN, STRESS FORMULA) tablet Take 1 tablet by mouth daily.      Marland Kitchen  guaiFENesin (MUCINEX) 600 MG 12 hr tablet Take 1,200 mg by mouth 2 (two) times daily.    Marland Kitchen HUMALOG MIX 50/50 KWIKPEN (50-50) 100 UNIT/ML Kwikpen INJECT 58 UNITS SQ EVERY MORNING AND 38 UNITS EVERY EVENING (Patient taking differently: INJECT 66 UNITS SQ EVERY MORNING AND 42 UNITS EVERY EVENING) 30 mL 2  . insulin lispro (HUMALOG KWIKPEN) 100 UNIT/ML KiwkPen INJECT UP TO 12 UNITS BEFORE LUNCH AS DIRECTED BASED ON BLOOD GLUCOSE    . Insulin Lispro Prot & Lispro (HUMALOG MIX 50/50 KWIKPEN) (50-50) 100 UNIT/ML Kwikpen Inject 66 units each morning and 42 units each evening. 15 mL   . Insulin Pen Needle (PEN NEEDLES 31GX5/16") 31G X 8 MM MISC USE AS DIRECTED 100 each 1  . Lancets (ONETOUCH ULTRASOFT) lancets CHECK BLOOD SUGAR 4 TIMES A DAY 100 each 1  . levothyroxine (SYNTHROID, LEVOTHROID) 50 MCG tablet TAKE 1 TABLET DAILY 30 tablet 11  . loratadine (CLARITIN) 10 MG tablet Take 10 mg by mouth daily as needed for allergies.    Marland Kitchen metoCLOPramide (REGLAN) 5 MG tablet Take 5 mg by mouth 2 (two) times daily.    . metoprolol tartrate (LOPRESSOR) 25 MG tablet Take 1.5 tablets (37.5 mg total) by mouth 2 (two) times daily. 90 tablet 4  . Multiple Vitamins-Minerals (SENTRY SENIOR) TABS Take 1 tablet by mouth daily.    . mupirocin ointment (BACTROBAN) 2 % APPLY TO AFFECTED AREA AS DIRECTED 22 g PRN  . omega-3 acid ethyl esters (LOVAZA) 1 G capsule TAKE (2) CAPSULES TWICE DAILY. 120 capsule 5  . ondansetron (ZOFRAN) 8 MG tablet Take 1 tablet (8 mg total) by mouth every  8 (eight) hours as needed for nausea or vomiting. 20 tablet 1  . ONE TOUCH ULTRA TEST test strip Check BS TID and PRN.DX.250.0 100 each 11  . pantoprazole (PROTONIX) 40 MG tablet Take 1 tablet (40 mg total) by mouth 2 (two) times daily. 60 tablet 2  . traMADol (ULTRAM) 50 MG tablet Take 1 tablet (50 mg total) by mouth every 6 (six) hours as needed. 60 tablet 0   No current facility-administered medications for this visit.    PHYSICAL  EXAMINATION: ECOG PERFORMANCE STATUS: 2 - Symptomatic, <50% confined to bed  Filed Vitals:   10/10/14 1517  BP: 159/56  Pulse: 66  Temp: 98.3 F (36.8 C)  Resp: 18   Filed Weights   10/10/14 1517  Weight: 191 lb 9.6 oz (86.909 kg)    GENERAL:alert, no distress and comfortable. She is debilitated. SKIN: skin color, texture, turgor are normal, no rashes or significant lesions Musculoskeletal:no cyanosis of digits and no clubbing  NEURO: alert & oriented x 3 with fluent speech LABORATORY DATA:  I have reviewed the data as listed    Component Value Date/Time   NA 136 09/23/2014 1037   NA 138 09/03/2014 0831   NA 139 03/29/2013 1240   K 5.3* 09/23/2014 1037   K 4.3 09/03/2014 0831   CL 99 09/03/2014 0831   CO2 21* 09/23/2014 1037   CO2 19 09/03/2014 0831   GLUCOSE 383* 09/23/2014 1037   GLUCOSE 281* 09/03/2014 0831   GLUCOSE 191* 03/29/2013 1240   BUN 22.9 09/23/2014 1037   BUN 17 09/03/2014 0831   BUN 17 03/29/2013 1240   CREATININE 2.2* 09/23/2014 1037   CREATININE 1.79* 09/03/2014 0831   CREATININE 1.36* 03/29/2013 1240   CALCIUM 10.0 09/23/2014 1037   CALCIUM 9.4 09/03/2014 0831   CALCIUM 8.1* 08/27/2009 1319   PROT 7.3 09/24/2014 1256   PROT 7.3 09/23/2014 1037   PROT 6.6 09/03/2014 0831   ALBUMIN 3.3* 09/24/2014 1256   ALBUMIN 2.9* 09/23/2014 1037   AST 101* 09/24/2014 1256   AST 89* 09/23/2014 1037   ALT 68* 09/24/2014 1256   ALT 65* 09/23/2014 1037   ALKPHOS 202* 09/24/2014 1256   ALKPHOS 231* 09/23/2014 1037   BILITOT 0.4 09/24/2014 1256   BILITOT 0.34 09/23/2014 1037   GFRNONAA 27* 09/03/2014 0831   GFRNONAA 38* 03/29/2013 1240   GFRAA 31* 09/03/2014 0831   GFRAA 44* 03/29/2013 1240    No results found for: SPEP, UPEP  Lab Results  Component Value Date   WBC 6.5 09/23/2014   NEUTROABS 4.6 09/23/2014   HGB 12.8 09/23/2014   HCT 37.9 09/23/2014   MCV 94.3 09/23/2014   PLT 209 09/23/2014      Chemistry      Component Value Date/Time   NA  136 09/23/2014 1037   NA 138 09/03/2014 0831   NA 139 03/29/2013 1240   K 5.3* 09/23/2014 1037   K 4.3 09/03/2014 0831   CL 99 09/03/2014 0831   CO2 21* 09/23/2014 1037   CO2 19 09/03/2014 0831   BUN 22.9 09/23/2014 1037   BUN 17 09/03/2014 0831   BUN 17 03/29/2013 1240   CREATININE 2.2* 09/23/2014 1037   CREATININE 1.79* 09/03/2014 0831   CREATININE 1.36* 03/29/2013 1240      Component Value Date/Time   CALCIUM 10.0 09/23/2014 1037   CALCIUM 9.4 09/03/2014 0831   CALCIUM 8.1* 08/27/2009 1319   ALKPHOS 202* 09/24/2014 1256   ALKPHOS 231* 09/23/2014 1037  AST 101* 09/24/2014 1256   AST 89* 09/23/2014 1037   ALT 68* 09/24/2014 1256   ALT 65* 09/23/2014 1037   BILITOT 0.4 09/24/2014 1256   BILITOT 0.34 09/23/2014 1037       RADIOGRAPHIC STUDIES: Skeletal survey showed no lytic lesions I have personally reviewed the radiological images as listed and agreed with the findings in the report.  ASSESSMENT & PLAN:  MGUS (monoclonal gammopathy of unknown significance) I discussed with her the natural history of MGUS. Her current condition with renal failure is not associated with MGUS. The detectable M spike is of small amount and she has no evidence of anemia or hypercalcemia. Skeletal survey show chronic degenerative disease and old compression fractures that are not related to this. I recommend repeat bloodwork and follow-up next year.  Chronic kidney disease This is likely due to diabetic nephropathy. She will continue close follow-up with her nephrologist.     Orders Placed This Encounter  Procedures  . CBC with Differential    Standing Status: Future     Number of Occurrences:      Standing Expiration Date: 11/14/2015  . Comprehensive metabolic panel    Standing Status: Future     Number of Occurrences:      Standing Expiration Date: 11/14/2015  . Lactate dehydrogenase    Standing Status: Future     Number of Occurrences:      Standing Expiration Date: 11/14/2015   . SPEP & IFE with QIG    Standing Status: Future     Number of Occurrences:      Standing Expiration Date: 11/14/2015  . Kappa/lambda light chains    Standing Status: Future     Number of Occurrences:      Standing Expiration Date: 11/14/2015  . Beta 2 microglobulin, serum    Standing Status: Future     Number of Occurrences:      Standing Expiration Date: 11/14/2015   All questions were answered. The patient knows to call the clinic with any problems, questions or concerns. No barriers to learning was detected. I spent 15 minutes counseling the patient face to face. The total time spent in the appointment was 20 minutes and more than 50% was on counseling and review of test results     [facility], [place], MD 10/10/2014 8:47 PM

## 2014-10-11 ENCOUNTER — Ambulatory Visit (INDEPENDENT_AMBULATORY_CARE_PROVIDER_SITE_OTHER): Payer: Medicare Other | Admitting: Internal Medicine

## 2014-10-11 ENCOUNTER — Encounter: Payer: Self-pay | Admitting: Internal Medicine

## 2014-10-11 VITALS — BP 160/60 | HR 76 | Ht 65.0 in | Wt 191.0 lb

## 2014-10-11 DIAGNOSIS — K317 Polyp of stomach and duodenum: Secondary | ICD-10-CM | POA: Insufficient documentation

## 2014-10-11 DIAGNOSIS — K3184 Gastroparesis: Secondary | ICD-10-CM

## 2014-10-11 DIAGNOSIS — K219 Gastro-esophageal reflux disease without esophagitis: Secondary | ICD-10-CM

## 2014-10-11 DIAGNOSIS — N189 Chronic kidney disease, unspecified: Secondary | ICD-10-CM

## 2014-10-11 DIAGNOSIS — R140 Abdominal distension (gaseous): Secondary | ICD-10-CM

## 2014-10-11 DIAGNOSIS — D472 Monoclonal gammopathy: Secondary | ICD-10-CM

## 2014-10-11 MED ORDER — ALIGN PO CAPS: 1.0000 | ORAL_CAPSULE | Freq: Every day | ORAL | Status: DC

## 2014-10-11 MED ORDER — METOCLOPRAMIDE HCL 5 MG PO TABS: 5.0000 mg | ORAL_TABLET | Freq: Two times a day (BID) | ORAL | Status: DC

## 2014-10-11 NOTE — Patient Instructions (Addendum)
We have sent the following medications to your pharmacy for you to pick up at your convenience:  Reglan    Continue your current medications.  Begin taking Align, which you can purchase over the counter. This puts good bacteria back into your colon. You should take 1 capsule by mouth once daily.    Please follow up in 4 months

## 2014-10-11 NOTE — Progress Notes (Signed)
Subjective:    Patient ID: [name], female    DOB: [birth year], 78 y.o.   MRN: [id number]  HPI [name] is a 78 yo female with complex PMH including but not limited to history of diverticulitis with resection and ileostomy in place, gastroparesis, gastric polyps, fatty liver, elevated LFTs, MGUS, CRI, DM, HTN, HL who is seen for follow-up. She was having issues with epigastric abdominal discomfort as well as nausea. She had an upper GI series which showed concern of gastric thickening and she came for EGD on 09/17/2014. This showed normal esophagus, multiple sessile polyps ranging from 2-10 mm in the gastric body, cardia and fundus. These were sampled. Several appeared erythematous and inflammatory in nature. No gastric tumor seen. Normal-appearing duodenum. Pathology results were consistent with fundic gland and hyperplastic polyps. There was no evidence of metaplasia or dysplasia. No H. Pylori. She is been taking Reglan 5 mg twice daily along with her PPI, pantoprazole 40 mg twice daily.  Recently from a GI standpoint she has been feeling fairly well. Her abdominal pain and nausea have resolved. She is having gas and occasional bloating which will fill her ostomy bag and sometimes interferes with the fit of her ostomy bag. She is eating more normal meals. She reports her blood sugars have been more controlled recently. She has recently been seen by oncology and diagnosed with MGUS, bone scan showed no concerning bony lesions. Her renal function has been watched by nephrology, Dr. Florene Glen. She denies right upper quadrant pain, jaundice, abdominal or lower extremity swelling.   Review of Systems As per HPI, otherwise negative  Current Medications, Allergies, Past Medical History, Past Surgical History, Family History and Social History were reviewed in [employer] record.      Objective:   Physical Exam BP 160/60 mmHg  Pulse 76  Ht '5\' 5"'  (1.651 m)  Wt 191 lb  (86.637 kg)  BMI 31.78 kg/m2 Constitutional: Well-developed and well-nourished elderly female. No distress. HEENT: Normocephalic and atraumatic. Oropharynx is clear and moist. No oropharyngeal exudate. Conjunctivae are normal. No scleral icterus. Neck: Neck supple. Trachea midline. Cardiovascular: Normal rate, regular rhythm and intact distal pulses.  Pulmonary/chest: Effort normal and breath sounds normal. No wheezing, rales or rhonchi. Abdominal: Soft, nontender, nondistended. Bowel sounds active throughout. Ileostomy left lower quadrant with peristomal hernia, Extensive scarring in the mid abdomen  Extremities: no clubbing, cyanosis, or edema Neurological: Alert and oriented to person place and time. Skin: Skin is warm and dry. No rashes noted. Psychiatric: Normal mood and affect. Behavior is normal  CMP     Component Value Date/Time   NA 136 09/23/2014 1037   NA 138 09/03/2014 0831   NA 139 03/29/2013 1240   K 5.3* 09/23/2014 1037   K 4.3 09/03/2014 0831   CL 99 09/03/2014 0831   CO2 21* 09/23/2014 1037   CO2 19 09/03/2014 0831   GLUCOSE 383* 09/23/2014 1037   GLUCOSE 281* 09/03/2014 0831   GLUCOSE 191* 03/29/2013 1240   BUN 22.9 09/23/2014 1037   BUN 17 09/03/2014 0831   BUN 17 03/29/2013 1240   CREATININE 2.2* 09/23/2014 1037   CREATININE 1.79* 09/03/2014 0831   CREATININE 1.36* 03/29/2013 1240   CALCIUM 10.0 09/23/2014 1037   CALCIUM 9.4 09/03/2014 0831   CALCIUM 8.1* 08/27/2009 1319   PROT 7.3 09/24/2014 1256   PROT 7.3 09/23/2014 1037   PROT 6.6 09/03/2014 0831   ALBUMIN 3.3* 09/24/2014 1256   ALBUMIN 2.9* 09/23/2014 1037  AST 101* 09/24/2014 1256   AST 89* 09/23/2014 1037   ALT 68* 09/24/2014 1256   ALT 65* 09/23/2014 1037   ALKPHOS 202* 09/24/2014 1256   ALKPHOS 231* 09/23/2014 1037   BILITOT 0.4 09/24/2014 1256   BILITOT 0.34 09/23/2014 1037   GFRNONAA 27* 09/03/2014 0831   GFRNONAA 38* 03/29/2013 1240   GFRAA 31* 09/03/2014 0831   GFRAA 44*  03/29/2013 1240   ANA, IgG, ASMA, AMA - neg Iron studies -- not elevated  Recent abd CT reviewed    Assessment & Plan:  78 yo female with complex PMH including but not limited to history of diverticulitis with resection and ileostomy in place, gastroparesis, gastric polyps, fatty liver, elevated LFTs, MGUS, CRI, DM, HTN, HL who is seen for follow-up.   1. Gastroparesis/GERD/gastric polyps -- fortunately much of her upper abdominal pain with nausea and vomiting have resolved with promotility agent, Reglan. She is taking a low-dose 5 mg twice daily. I have continued to recommend he gastroparesis diet. We discussed EGD and pathology results. These polyps were benign in nature without dysplasia. Hyperplastic polyps rarely can have precancer potential, but fortunately there was no dysplasia recently. I would not expect these polyps to ever bother her. She will continue PPI and Reglan at current dose, as both are helping and well tolerated  2. Elevated liver enzymes -- predominantly hepatocellular in nature but also elevated alkaline phosphatase. Liver grossly normal on recent CT. I expect there is an element of fatty liver disease. Other workup for autoimmune, viral, and iron storage disease negative. Will follow for now, avoid hepatotoxins. Evidence for decompensated liver disease  3. Gas and bloating -- trial of probiotic with align 1 capsule daily.  4. Chronic renal insufficiency -- followed by nephrology  5. MGUS -- no evidence for multiple myeloma, but this will be monitored and followed by Dr. Alvy Bimler with heme onc.  I'll see her back in 3-4 months, sooner if necessary, she also follows with Dr. Laurance Flatten at [facility]

## 2014-10-15 ENCOUNTER — Other Ambulatory Visit: Payer: Self-pay | Admitting: Family Medicine

## 2014-10-15 ENCOUNTER — Telehealth: Payer: Self-pay | Admitting: Family Medicine

## 2014-10-15 NOTE — Telephone Encounter (Signed)
Patient spoke with

## 2014-10-15 NOTE — Telephone Encounter (Signed)
Refill per protocol till next visit in March

## 2014-10-16 ENCOUNTER — Telehealth: Payer: Self-pay | Admitting: Pharmacist

## 2014-10-16 NOTE — Telephone Encounter (Signed)
Called patient to follow up on BG control / diabetes.  Current insulin dose is Humalog 50/50 mix to 66 units qam and 42 units each evening. Am readings 213, 150, 177 Lunchtime 229, 238, 291,  Evening readings - patient did not have readings written done to report Recommended Increase Humalog 50/50 mix to 70 units qam and 45 units each evening.

## 2014-10-16 NOTE — Telephone Encounter (Signed)
-----   Message from [place], [place] sent at 09/27/2014 12:42 PM EST ----- Cal to follow up BG

## 2014-11-06 ENCOUNTER — Ambulatory Visit (INDEPENDENT_AMBULATORY_CARE_PROVIDER_SITE_OTHER): Payer: Medicare Other | Admitting: Pharmacist

## 2014-11-06 ENCOUNTER — Encounter: Payer: Self-pay | Admitting: Pharmacist

## 2014-11-06 ENCOUNTER — Ambulatory Visit (INDEPENDENT_AMBULATORY_CARE_PROVIDER_SITE_OTHER): Payer: Medicare Other

## 2014-11-06 VITALS — BP 150/60 | HR 68 | Ht 61.0 in | Wt 192.0 lb

## 2014-11-06 DIAGNOSIS — M810 Age-related osteoporosis without current pathological fracture: Secondary | ICD-10-CM

## 2014-11-06 LAB — HM DEXA SCAN

## 2014-11-06 NOTE — Patient Instructions (Signed)
Fall Prevention and Home Safety Falls cause injuries and can affect all age groups. It is possible to use preventive measures to significantly decrease the likelihood of falls. There are many simple measures which can make your home safer and prevent falls. OUTDOORS  Repair cracks and edges of walkways and driveways.  Remove high doorway thresholds.  Trim shrubbery on the main path into your home.  Have good outside lighting.  Clear walkways of tools, rocks, debris, and clutter.  Check that handrails are not broken and are securely fastened. Both sides of steps should have handrails.  Have leaves, snow, and ice cleared regularly.  Use sand or salt on walkways during winter months.  In the garage, clean up grease or oil spills. BATHROOM  Install night lights.  Install grab bars by the toilet and in the tub and shower.  Use non-skid mats or decals in the tub or shower.  Place a plastic non-slip stool in the shower to sit on, if needed.  Keep floors dry and clean up all water on the floor immediately.  Remove soap buildup in the tub or shower on a regular basis.  Secure bath mats with non-slip, double-sided rug tape.  Remove throw rugs and tripping hazards from the floors. BEDROOMS  Install night lights.  Make sure a bedside light is easy to reach.  Do not use oversized bedding.  Keep a telephone by your bedside.  Have a firm chair with side arms to use for getting dressed.  Remove throw rugs and tripping hazards from the floor. KITCHEN  Keep handles on pots and pans turned toward the center of the stove. Use back burners when possible.  Clean up spills quickly and allow time for drying.  Avoid walking on wet floors.  Avoid hot utensils and knives.  Position shelves so they are not too high or low.  Place commonly used objects within easy reach.  If necessary, use a sturdy step stool with a grab bar when reaching.  Keep electrical cables out of the  way.  Do not use floor polish or wax that makes floors slippery. If you must use wax, use non-skid floor wax.  Remove throw rugs and tripping hazards from the floor. STAIRWAYS  Never leave objects on stairs.  Place handrails on both sides of stairways and use them. Fix any loose handrails. Make sure handrails on both sides of the stairways are as long as the stairs.  Check carpeting to make sure it is firmly attached along stairs. Make repairs to worn or loose carpet promptly.  Avoid placing throw rugs at the top or bottom of stairways, or properly secure the rug with carpet tape to prevent slippage. Get rid of throw rugs, if possible.  Have an electrician put in a light switch at the top and bottom of the stairs. OTHER FALL PREVENTION TIPS  Wear low-heel or rubber-soled shoes that are supportive and fit well. Wear closed toe shoes.  When using a stepladder, make sure it is fully opened and both spreaders are firmly locked. Do not climb a closed stepladder.  Add color or contrast paint or tape to grab bars and handrails in your home. Place contrasting color strips on first and last steps.  Learn and use mobility aids as needed. Install an electrical emergency response system.  Turn on lights to avoid dark areas. Replace light bulbs that burn out immediately. Get light switches that glow.  Arrange furniture to create clear pathways. Keep furniture in the same place.    Firmly attach carpet with non-skid or double-sided tape.  Eliminate uneven floor surfaces.  Select a carpet pattern that does not visually hide the edge of steps.  Be aware of all pets. OTHER HOME SAFETY TIPS  Set the water temperature for 120 F (48.8 C).  Keep emergency numbers on or near the telephone.  Keep smoke detectors on every level of the home and near sleeping areas. Document Released: 10/08/2002 Document Revised: 04/18/2012 Document Reviewed: 01/07/2012 Exit[employer] Patient Information 2015  [employer]. This information is not intended to replace advice given to you by your health care provider. Make sure you discuss any questions you have with your health care provider.                Exercise for Strong Bones  Exercise is important to build and maintain strong bones / bone density.  There are 2 types of exercises that are important to building and maintaining strong bones:  Weight- bearing and muscle-stregthening.  Weight-bearing Exercises  These exercises include activities that make you move against gravity while staying upright. Weight-bearing exercises can be high-impact or low-impact.  High-impact weight-bearing exercises help build bones and keep them strong. If you have broken a bone due to osteoporosis or are at risk of breaking a bone, you may need to avoid high-impact exercises. If you're not sure, you should check with your healthcare provider.  Examples of high-impact weight-bearing exercises are: Dancing  Doing high-impact aerobics  Hiking  Jogging/running  Jumping Rope  Stair climbing  Tennis  Low-impact weight-bearing exercises can also help keep bones strong and are a safe alternative if you cannot do high-impact exercises.   Examples of low-impact weight-bearing exercises are: Using elliptical training machines  Doing low-impact aerobics  Using stair-step machines  Fast walking on a treadmill or outside   Muscle-Strengthening Exercises These exercises include activities where you move your body, a weight or some other resistance against gravity. They are also known as resistance exercises and include: Lifting weights  Using elastic exercise bands  Using weight machines  Lifting your own body weight  Functional movements, such as standing and rising up on your toes  Yoga and Pilates can also improve strength, balance and flexibility. However, certain positions may not be safe for people with osteoporosis or those at increased risk of broken  bones. For example, exercises that have you bend forward may increase the chance of breaking a bone in the spine.   Non-Impact Exercises There are other types of exercises that can help prevent falls.  Non-impact exercises can help you to improve balance, posture and how well you move in everyday activities. Some of these exercises include: Balance exercises that strengthen your legs and test your balance, such as Tai Chi, can decrease your risk of falls.  Posture exercises that improve your posture and reduce rounded or "sloping" shoulders can help you decrease the chance of breaking a bone, especially in the spine.  Functional exercises that improve how well you move can help you with everyday activities and decrease your chance of falling and breaking a bone. For example, if you have trouble getting up from a chair or climbing stairs, you should do these activities as exercises.   **A physical therapist can teach you balance, posture and functional exercises. He/she can also help you learn which exercises are safe and appropriate for you.  [name] has a physical therapy office in [place] in front of our office and referrals can be made for assessments   and treatment as needed and strength and balance training.  If you would like to have an assessment with [name] and our physical therapy team please let a nurse or provider know.    

## 2014-11-06 NOTE — Progress Notes (Signed)
Patient ID: [name], female   DOB: [birth year], 79 y.o.   MRN: [id number]  [facility] Current Height: Height: 5\' 1"  (154.9 cm)      Max Lifetime Height:  5' 1.5" Current Weight: Weight: 192 lb (87.091 kg)       Ethnicity:Caucasian  BP: BP: (!) 150/60 mmHg     HR:  Pulse Rate: 68      HPI: Patient with osteoporosis for several years.   She also has chronic kidney failure and MGUS, type 2 DM requiring insulin, HTN, gastroparesis, OA, hyperlipidemia, elevated LFTS (likely fatty liver), vitamin D insufficiency.  History of partial colectomy and CVA.  Back Pain?  Yes       Kyphosis?  No Prior fracture?  No Med(s) for Osteoporosis/Osteopenia:  none Med(s) previously tried for Osteoporosis/Osteopenia:  prolia (last dose which was due 06/2014 was held due to worsening renal function. Last dose was 12/2013)                                                             PMH: Age at menopause:  77's Hysterectomy?  Yes Oophorectomy?  Yes HRT? No Steroid Use?  No Thyroid med?  Yes History of cancer?  No History of digestive disorders (ie Crohn's)?  Yes Current or previous eating disorders?  No Last Vitamin D Result:  15.1 (09/03/2014) Last GFR Result: 31 (09/03/2014)    FH/SH: Family history of osteoporosis?  No Parent with history of hip fracture?  No Family history of breast cancer?  No Exercise?  No Smoking?  No Alcohol?  No    Calcium Assessment Calcium Intake  # of servings/day  Calcium mg  Milk (8 oz) 0.5  x  300  = 150mg   Yogurt (4 oz) 0 x  200 = 0  Cheese (1 oz) 1 x  200 = 200mg   Other Calcium sources   250mg   Ca supplement MVI = 400mg    Estimated calcium intake per day 1000mg     DEXA Results Date of Test T-Score for AP Spine L1-L4 T-Score for Total Left Hip T-Score for Total Right Hip  11/06/2014 -0.7 -2.6 -2.7  10/18/2012 -1.0 -2.3 -2.4  09/23/2010 -1.1 -2.2 -2.6  09/05/2006 -0.9 -1.4 -1.9     Assessment: Osteoporosis   Recommendations: 1.   Discussed pros and cons of many treatment options - bisphosphonates - not recommended with decreases renal function;  Prolia - caution use in decreased renal function and I am cautious to use Prolia or Forteo with her diagnosis of MGUS.  Due to history of CVA - Evista is not good choice.  2.  recommend calcium 1200mg  daily through supplementation or diet.  3.  recommend weight bearing exercise and core strengthening exercise as able to tolerate 4.  Counseled and educated about fall risk and prevention. 5.   Discussed BMD  / DEXA results and discussed fracture risk.   Recheck DEXA:  1 year  Time spent counseling patient:  40 minutes  Cherre Robins, PharmD, CPP

## 2014-11-08 ENCOUNTER — Other Ambulatory Visit: Payer: Self-pay | Admitting: Family Medicine

## 2014-12-20 ENCOUNTER — Other Ambulatory Visit: Payer: Self-pay | Admitting: Pharmacist

## 2014-12-25 ENCOUNTER — Telehealth: Payer: Self-pay | Admitting: Internal Medicine

## 2014-12-25 NOTE — Telephone Encounter (Signed)
Pt states she is still having problems with nausea. Requests to see Dr. Hilarie Fredrickson. Pt scheduled to see Dr. Hilarie Fredrickson 02/13/15@11am . Pt aware of appt.

## 2014-12-26 ENCOUNTER — Telehealth: Payer: Self-pay

## 2014-12-26 NOTE — Telephone Encounter (Signed)
Jerene Bears, MD at 12/25/2014 12:19 PM     Status: Signed       Expand All Collapse All   Mr. Kearley, I think this warrants an office visit. I'm going to bring her into see one of our advanced practitioner's on a day when I am in the office due to the lack of availability to see me directly You should hear from my office soon  Vaughan Basta, please schedule her to see Amy on a day when I am in clinic       Pt scheduled to see Nicoletta Ba PA 01/13/15@9 :30am.

## 2015-01-02 ENCOUNTER — Other Ambulatory Visit: Payer: Self-pay | Admitting: Family Medicine

## 2015-01-13 ENCOUNTER — Ambulatory Visit (INDEPENDENT_AMBULATORY_CARE_PROVIDER_SITE_OTHER): Payer: Medicare Other | Admitting: Physician Assistant

## 2015-01-13 ENCOUNTER — Encounter: Payer: Self-pay | Admitting: Physician Assistant

## 2015-01-13 VITALS — BP 146/60 | HR 64 | Ht 61.0 in | Wt 192.0 lb

## 2015-01-13 DIAGNOSIS — R1011 Right upper quadrant pain: Secondary | ICD-10-CM

## 2015-01-13 DIAGNOSIS — K219 Gastro-esophageal reflux disease without esophagitis: Secondary | ICD-10-CM

## 2015-01-13 DIAGNOSIS — K3184 Gastroparesis: Secondary | ICD-10-CM

## 2015-01-13 DIAGNOSIS — R110 Nausea: Secondary | ICD-10-CM

## 2015-01-13 MED ORDER — ONDANSETRON HCL 8 MG PO TABS: ORAL_TABLET | ORAL | Status: DC

## 2015-01-13 NOTE — Patient Instructions (Signed)
We sent a prescription for Zofran ( Ondansetron) to [facility]. Take 1/2 to 1 tab 3 times daily as needed.  Take 1st dose early morning before breakfast.  Increase the Reglan ( Meltoclopramide) 5 mg, 3 times daily, Take 1 hour before breakfast and dinner.

## 2015-01-13 NOTE — Progress Notes (Addendum)
Patient ID: [name], female   DOB: 11-04-1935, 79 y.o.   MRN: [id number]   Subjective:    Patient ID: [name], female    DOB: 15-Aug-1936, 79 y.o.   MRN: [id number]  HPI [name] is a  Very nice 79 yo female known to Dr.Pyrtle. She has multiple medical problems including adult-onset diabetes mellitus, hyperlipidemia, hypertension, chronic GERD, and gastroparesis. She also has chronic kidney disease a prior CVA a monoclonal gammopathy of unknown significance and a remote ileostomy secondary to complicated diverticulitis. She was last seen in November 2015 at which time she had undergone upper endoscopy. She was found to have multiple tiny gastric polyps, biopsies were taken and these were consistent with fundic gland polyps. There was also one polyp which was hyperplastic. She has had chronic problems with nausea and upper abdominal pain. She comes in today because of an increase in nausea over the past couple of months. He says her symptoms seem to be worse in the morning Shiley she'll have upper abdominal discomfort and right upper quadrant discomfort and early in the mornings as well this does not seem to be affected by by mouth intake and she says the pain sometimes feels more like a musculoskeletal pain especially on her right side. She does not have any vomiting but says she is frequently queasy throughout most days again worse in the morning. She had been taking her Reglan after breakfast and again after her evening meal. She is also on twice a day PPI. She had not been taking any anti-emetic recently because she felt that it made her sleepy.  Review of Systems Pertinent positive and negative review of systems were noted in the above HPI section.  All other review of systems was otherwise negative.  Outpatient Encounter Prescriptions as of 01/13/2015  Medication Sig  . acetaminophen (TYLENOL) 500 MG tablet Take 1,000 mg by mouth every 6 (six) hours as needed.   Marland Kitchen amLODipine (NORVASC) 10  MG tablet TAKE 1 TABLET ONCE A DAY  . aspirin 81 MG tablet Take 81 mg by mouth daily.  . B Complex-C-Folic Acid (MULTIVITAMIN, STRESS FORMULA) tablet Take 1 tablet by mouth daily.    . bifidobacterium infantis (ALIGN) capsule Take 1 capsule by mouth daily.  Marland Kitchen guaiFENesin (MUCINEX) 600 MG 12 hr tablet Take 1,200 mg by mouth 2 (two) times daily.  . insulin lispro (HUMALOG) 100 UNIT/ML injection Inject 100 Units into the skin. INJECT UP TO 12 UNITS BEFORE LUNCH AS DIRECTED BASED ON BLOOD GLUCOSE  . Insulin Lispro Prot & Lispro (HUMALOG MIX 50/50 KWIKPEN) (50-50) 100 UNIT/ML Kwikpen Inject 70 units qam with breakfast and 45 units qpm with supper  . Insulin Pen Needle (PEN NEEDLES 31GX5/16") 31G X 8 MM MISC Use to give insulin bid  . Lancets (ONETOUCH ULTRASOFT) lancets CHECK BLOOD SUGAR 4 TIMES A DAY  . levothyroxine (SYNTHROID, LEVOTHROID) 50 MCG tablet TAKE 1 TABLET DAILY  . loratadine (CLARITIN) 10 MG tablet Take 10 mg by mouth daily as needed for allergies.  Marland Kitchen metoCLOPramide (REGLAN) 5 MG tablet Take 1 tablet (5 mg total) by mouth 2 (two) times daily.  . metoprolol tartrate (LOPRESSOR) 25 MG tablet Take 1.5 tablets (37.5 mg total) by mouth 2 (two) times daily.  . mupirocin ointment (BACTROBAN) 2 % APPLY TO AFFECTED AREA AS DIRECTED  . omega-3 acid ethyl esters (LOVAZA) 1 G capsule TAKE (2) CAPSULES TWICE DAILY.  Marland Kitchen ondansetron (ZOFRAN) 8 MG tablet Take 1/2 to 1 tab by mouth  3 times daily as needed. Take 1 dose early AM before breakfast.  . ONE TOUCH ULTRA TEST test strip Check BS TID and [id number]  . pantoprazole (PROTONIX) 40 MG tablet TAKE  (1)  TABLET TWICE A DAY.  . traMADol (ULTRAM) 50 MG tablet Take 1 tablet (50 mg total) by mouth every 6 (six) hours as needed.  . [DISCONTINUED] ondansetron (ZOFRAN) 8 MG tablet Take 1 tablet (8 mg total) by mouth every 8 (eight) hours as needed for nausea or vomiting.   Allergies  Allergen Reactions  . Ace Inhibitors   . Actos [Pioglitazone  Hydrochloride]   . Aspirin     REACTION: GI upset  . Codeine     REACTION: hallucinations  . Hydromorphone Hcl   . Metformin And Related   . Penicillins     REACTION: "crazy feeling"  . Procaine Hcl   . Statins Other (See Comments)    myalgias  . Sulfa Antibiotics    Patient Active Problem List   Diagnosis Date Noted  . Gastric polyps 10/11/2014  . MGUS (monoclonal gammopathy of unknown significance) 10/10/2014  . Proteinuria 09/23/2014  . Gastroparesis due to secondary diabetes 08/05/2014  . Vitamin D insufficiency 03/29/2013  . Chronic kidney disease 09/14/2011  . CVA (cerebral infarction), past history 09/14/2011  . 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 DIABETES MELLITUS, TYPE II 08/12/2008  . HYPERLIPIDEMIA 08/12/2008  . HYPERTENSION 08/12/2008  . GERD 08/12/2008  . OSTEOARTHRITIS, lumbar spine 08/12/2008  . Osteoporosis, postmenopausal 08/12/2008  . KNEE REPLACEMENT, RIGHT, HX OF 08/12/2008   History   Social History  . Marital Status: Married    Spouse Name: N/A  . Number of Children: 1  . Years of Education: N/A   Occupational History  . retired    Social History Main Topics  . Smoking status: Never Smoker   . Smokeless tobacco: Never Used  . Alcohol Use: No  . Drug Use: No  . Sexual Activity: Not on file   Other Topics Concern  . Not on file   Social History Narrative    [name]'s family history includes Diabetes in her mother and sister; Heart disease in her father. There is no history of Colon cancer.      Objective:    Filed Vitals:   01/13/15 0923  BP: 146/60  Pulse: 64    Physical Exam  well-developed elderly white female in no acute distress, accompanied by her husband. Ambulates with walker, Blood pressure 146/60 pulse 64 height 5 foot 1 weight 192. HEENT; nontraumatic normocephalic EOMI PERRLA sclera anicteric, Supple; no JVD, Cardiovascular; regular rate and rhythm with S1-S2 no murmur or gallop, Pulmonary ;clear  bilaterally, Abdomen; obese soft no focal tenderness she does have multiple incisional scars and has a colostomy in the left lower quadrant guarding or rebound no palpable mass or hepatosplenomegaly, Rectal; exam not done, Extremities ;no clubbing cyanosis or edema skin warm and dry, Psych ;mood and affect appropriate       Assessment & Plan:   #1 79 yo female with chronic nausea secondary to gastroparesis. Prior GB workup negative  #2  gastric polyps-fundic gland and  hyperplastic #3 AODM #4 MGUS #5 chronic GERD #6 HX of CVA #7HTN #8 CKD  Plan;Continue Protonix twice daily  Increase Reglan to 5 mg three times daily and dose one hour before breakfast and dinner and QHS Restart Zofran at lower dose of 4 mg, advised to take one qam  on arising and the q6-8 prn  thereafter. Follow up with Dr Hilarie Fredrickson in 3 months    Amy Genia Harold PA-C 01/13/2015   Cc: Chipper Herb, MD  Addendum: Reviewed and agree with ongoing management. Jerene Bears, MD

## 2015-01-16 ENCOUNTER — Encounter: Payer: Self-pay | Admitting: Family Medicine

## 2015-01-16 ENCOUNTER — Ambulatory Visit (INDEPENDENT_AMBULATORY_CARE_PROVIDER_SITE_OTHER): Payer: Medicare Other | Admitting: Family Medicine

## 2015-01-16 VITALS — BP 193/77 | HR 71 | Temp 97.3°F | Ht 61.0 in | Wt 192.0 lb

## 2015-01-16 DIAGNOSIS — K219 Gastro-esophageal reflux disease without esophagitis: Secondary | ICD-10-CM

## 2015-01-16 DIAGNOSIS — E118 Type 2 diabetes mellitus with unspecified complications: Secondary | ICD-10-CM | POA: Diagnosis not present

## 2015-01-16 DIAGNOSIS — I10 Essential (primary) hypertension: Secondary | ICD-10-CM

## 2015-01-16 DIAGNOSIS — K3184 Gastroparesis: Secondary | ICD-10-CM

## 2015-01-16 DIAGNOSIS — R7989 Other specified abnormal findings of blood chemistry: Secondary | ICD-10-CM

## 2015-01-16 DIAGNOSIS — E785 Hyperlipidemia, unspecified: Secondary | ICD-10-CM

## 2015-01-16 DIAGNOSIS — E039 Hypothyroidism, unspecified: Secondary | ICD-10-CM

## 2015-01-16 DIAGNOSIS — N183 Chronic kidney disease, stage 3 unspecified: Secondary | ICD-10-CM

## 2015-01-16 DIAGNOSIS — Z23 Encounter for immunization: Secondary | ICD-10-CM

## 2015-01-16 DIAGNOSIS — N2889 Other specified disorders of kidney and ureter: Secondary | ICD-10-CM

## 2015-01-16 DIAGNOSIS — E1143 Type 2 diabetes mellitus with diabetic autonomic (poly)neuropathy: Secondary | ICD-10-CM

## 2015-01-16 LAB — POCT CBC
Granulocyte percent: 70.3 %G (ref 37–80)
HCT, POC: 39.3 % (ref 37.7–47.9)
Hemoglobin: 12.2 g/dL (ref 12.2–16.2)
LYMPH, POC: 1.6 (ref 0.6–3.4)
MCH: 29 pg (ref 27–31.2)
MCHC: 31 g/dL — AB (ref 31.8–35.4)
MCV: 93.5 fL (ref 80–97)
MPV: 9.9 fL (ref 0–99.8)
PLATELET COUNT, POC: 230 10*3/uL (ref 142–424)
POC Granulocyte: 5.1 (ref 2–6.9)
POC LYMPH %: 22.1 % (ref 10–50)
RBC: 4.21 M/uL (ref 4.04–5.48)
RDW, POC: 14.8 %
WBC: 7.3 10*3/uL (ref 4.6–10.2)

## 2015-01-16 LAB — POCT GLYCOSYLATED HEMOGLOBIN (HGB A1C)

## 2015-01-16 NOTE — Patient Instructions (Addendum)
Medicare Annual Wellness Visit  [facility] and the medical providers at [facility] strive to bring you the best medical care.  In doing so we not only want to address your current medical conditions and concerns but also to detect new conditions early and prevent illness, disease and health-related problems.    Medicare offers a yearly Wellness Visit which allows our clinical staff to assess your need for preventative services including immunizations, lifestyle education, counseling to decrease risk of preventable diseases and screening for fall risk and other medical concerns.    This visit is provided free of charge (no copay) for all Medicare recipients. The clinical pharmacists at [facility] have begun to conduct these Wellness Visits which will also include a thorough review of all your medications.    As you primary medical provider recommend that you make an appointment for your Annual Wellness Visit if you have not done so already this year.  You may set up this appointment before you leave today or you may call back ([phone]) and schedule an appointment.  Please make sure when you call that you mention that you are scheduling your Annual Wellness Visit with the clinical pharmacist so that the appointment may be made for the proper length of time.     Continue current medications. Continue good therapeutic lifestyle changes which include good diet and exercise. Fall precautions discussed with patient. If an FOBT was given today- please return it to our front desk. If you are over 78 years old - you may need Prevnar 59 or the adult Pneumonia vaccine.  Flu Shots are still available at our office. If you still haven't had one please call to set up a nurse visit to get one.   After your visit with [name] today you will receive a survey in the mail or online from [employer] regarding your care with [name]. Please take a moment to  fill this out. Your feedback is very important to [name] as you can help [name] better understand your patient needs as well as improve your experience and satisfaction. WE CARE ABOUT YOU!!!   continue to drink plenty of fluids  Continue regular follow-ups with cardiology and gastroenterology  Exercise as much as possible by walking around the house  Decrease sugar in diet as much as possible

## 2015-01-16 NOTE — Addendum Note (Signed)
Addended by: Zannie Cove on: 01/16/2015 10:55 AM   Modules accepted: Orders

## 2015-01-16 NOTE — Progress Notes (Signed)
Subjective:    Patient ID: [name], female    DOB: 1936/09/28, 79 y.o.   MRN: [id number]  HPI Pt here for follow up and management of chronic medical problems which includes hypertension, hyperlipidemia, hypothyroid, and diabetes. She is taking medications regularly. The patient recently saw the gastroenterologist for continued GI problems and discomfort. She has GERD and gastroparesis likely associated with her diabetes.The patient was in good spirits with no specific complaints.         Patient Active Problem List   Diagnosis Date Noted  . Gastric polyps 10/11/2014  . MGUS (monoclonal gammopathy of unknown significance) 10/10/2014  . Proteinuria 09/23/2014  . Gastroparesis due to secondary diabetes 08/05/2014  . Vitamin D insufficiency 03/29/2013  . Chronic kidney disease 09/14/2011  . CVA (cerebral infarction), past history 09/14/2011  . 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 DIABETES MELLITUS, TYPE II 08/12/2008  . HYPERLIPIDEMIA 08/12/2008  . HYPERTENSION 08/12/2008  . GERD 08/12/2008  . OSTEOARTHRITIS, lumbar spine 08/12/2008  . Osteoporosis, postmenopausal 08/12/2008  . KNEE REPLACEMENT, RIGHT, HX OF 08/12/2008   Outpatient Encounter Prescriptions as of 01/16/2015  Medication Sig  . acetaminophen (TYLENOL) 500 MG tablet Take 1,000 mg by mouth every 6 (six) hours as needed.   Marland Kitchen amLODipine (NORVASC) 10 MG tablet TAKE 1 TABLET ONCE A DAY  . aspirin 81 MG tablet Take 81 mg by mouth daily.  . B Complex-C-Folic Acid (MULTIVITAMIN, STRESS FORMULA) tablet Take 1 tablet by mouth daily.    . bifidobacterium infantis (ALIGN) capsule Take 1 capsule by mouth daily.  Marland Kitchen guaiFENesin (MUCINEX) 600 MG 12 hr tablet Take 1,200 mg by mouth 2 (two) times daily.  . insulin lispro (HUMALOG) 100 UNIT/ML injection Inject 100 Units into the skin. INJECT UP TO 12 UNITS BEFORE LUNCH AS DIRECTED BASED ON BLOOD GLUCOSE  . Insulin Lispro Prot & Lispro (HUMALOG MIX 50/50 KWIKPEN)  (50-50) 100 UNIT/ML Kwikpen Inject 70 units qam with breakfast and 45 units qpm with supper  . Insulin Pen Needle (PEN NEEDLES 31GX5/16") 31G X 8 MM MISC Use to give insulin bid  . Lancets (ONETOUCH ULTRASOFT) lancets CHECK BLOOD SUGAR 4 TIMES A DAY  . levothyroxine (SYNTHROID, LEVOTHROID) 50 MCG tablet TAKE 1 TABLET DAILY  . loratadine (CLARITIN) 10 MG tablet Take 10 mg by mouth daily as needed for allergies.  Marland Kitchen metoCLOPramide (REGLAN) 5 MG tablet Take 1 tablet (5 mg total) by mouth 2 (two) times daily.  . metoprolol tartrate (LOPRESSOR) 25 MG tablet Take 1.5 tablets (37.5 mg total) by mouth 2 (two) times daily.  . mupirocin ointment (BACTROBAN) 2 % APPLY TO AFFECTED AREA AS DIRECTED  . omega-3 acid ethyl esters (LOVAZA) 1 G capsule TAKE (2) CAPSULES TWICE DAILY.  Marland Kitchen ondansetron (ZOFRAN) 8 MG tablet Take 1/2 to 1 tab by mouth 3 times daily as needed. Take 1 dose early AM before breakfast.  . ONE TOUCH ULTRA TEST test strip Check BS TID and PRN.DX.[id number]  . pantoprazole (PROTONIX) 40 MG tablet TAKE  (1)  TABLET TWICE A DAY.  . traMADol (ULTRAM) 50 MG tablet Take 1 tablet (50 mg total) by mouth every 6 (six) hours as needed.    Review of Systems  Constitutional: Negative.   HENT: Negative.   Eyes: Negative.   Respiratory: Negative.   Cardiovascular: Negative.   Gastrointestinal: Negative.   Endocrine: Negative.   Genitourinary: Negative.   Musculoskeletal: Negative.   Skin: Negative.   Allergic/Immunologic: Negative.   Neurological: Negative.  Hematological: Negative.   Psychiatric/Behavioral: Negative.        Objective:   Physical Exam  Constitutional: She is oriented to person, place, and time. She appears well-developed and well-nourished. No distress.  HENT:  Head: Normocephalic and atraumatic.  Right Ear: External ear normal.  Left Ear: External ear normal.  Nose: Nose normal.  Mouth/Throat: Oropharynx is clear and moist.  Eyes: Conjunctivae and EOM are normal. Pupils are  equal, round, and reactive to light. Right eye exhibits no discharge. Left eye exhibits no discharge. No scleral icterus.  Neck: Normal range of motion. Neck supple. No thyromegaly present.  Cardiovascular: Normal rate, regular rhythm, normal heart sounds and intact distal pulses.  Exam reveals no gallop and no friction rub.   No murmur heard. At 72/m  Pulmonary/Chest: Effort normal and breath sounds normal. No respiratory distress. She has no wheezes. She has no rales. She exhibits no tenderness.  Abdominal: Soft. Bowel sounds are normal. She exhibits no mass. There is no tenderness. There is no rebound and no guarding.  Masses or tenderness. Patient has colostomy bag in place  Musculoskeletal: Normal range of motion. She exhibits no edema or tenderness.  Lymphadenopathy:    She has no cervical adenopathy.  Neurological: She is alert and oriented to person, place, and time. She has normal reflexes. No cranial nerve deficit.  Skin: Skin is warm and dry. No rash noted.  Psychiatric: She has a normal mood and affect. Her behavior is normal. Judgment and thought content normal.  Nursing note and vitals reviewed.  BP 193/77 mmHg  Pulse 71  Temp(Src) 97.3 F (36.3 C) (Oral)  Ht '5\' 1"'  (1.549 m)  Wt 192 lb (87.091 kg)  BMI 36.30 kg/m2        Assessment & Plan:  1. Essential hypertension, benign -The patient's blood pressure was elevated this morning and she has not been checking blood pressure readings at home. The second reading was improved from the first but was still elevated on the systolic side. They will monitor blood pressures at home and bring readings back to me in a couple weeks for review and they should bring these readings to every visit. - POCT CBC - BMP8+EGFR - Hepatic function panel  2. Low serum vitamin D -Any changes in her vitamin D intake will be determined by her vitamin D level today. - POCT CBC - Vit D  25 hydroxy (rtn osteoporosis monitoring)  3.  Hypothyroidism, unspecified hypothyroidism type -She should continue with her current thyroid replacement until lab work is returned from current visit - POCT CBC  4. Hyperlipidemia -Continue with current medication pending lab work results today - POCT CBC - NMR, lipoprofile  5.  diabetes mellitus2 with chronic kidney disease stage III -Home blood sugars according to the patient and her husband have been somewhat better running between 130 and 150. She should continue with current treatment pending results of lab work being done today - POCT CBC - POCT glycosylated hemoglobin (Hb A1C) - BMP8+EGFR  6. CRI (chronic renal insufficiency), stage 3 (moderate) -We will continue to monitor her blood sugar and renal function regularly and she will follow-up when necessary with nephrology. She usually sees the nephrologist every 6 months. - POCT CBC - BMP8+EGFR  7. Gastroparesis due to DM -She will continue to be followed by gastroenterology  8. Gastroesophageal reflux disease without esophagitis -Continue follow-up with gastroenterology as she is asymptomatic with this today.  Patient Instructions  Medicare Annual Wellness Visit  [facility] and the medical providers at [facility] strive to bring you the best medical care.  In doing so we not only want to address your current medical conditions and concerns but also to detect new conditions early and prevent illness, disease and health-related problems.    Medicare offers a yearly Wellness Visit which allows our clinical staff to assess your need for preventative services including immunizations, lifestyle education, counseling to decrease risk of preventable diseases and screening for fall risk and other medical concerns.    This visit is provided free of charge (no copay) for all Medicare recipients. The clinical pharmacists at [facility] have begun to conduct these  Wellness Visits which will also include a thorough review of all your medications.    As you primary medical provider recommend that you make an appointment for your Annual Wellness Visit if you have not done so already this year.  You may set up this appointment before you leave today or you may call back ([phone]) and schedule an appointment.  Please make sure when you call that you mention that you are scheduling your Annual Wellness Visit with the clinical pharmacist so that the appointment may be made for the proper length of time.     Continue current medications. Continue good therapeutic lifestyle changes which include good diet and exercise. Fall precautions discussed with patient. If an FOBT was given today- please return it to our front desk. If you are over 70 years old - you may need Prevnar 48 or the adult Pneumonia vaccine.  Flu Shots are still available at our office. If you still haven't had one please call to set up a nurse visit to get one.   After your visit with [name] today you will receive a survey in the mail or online from [employer] regarding your care with [name]. Please take a moment to fill this out. Your feedback is very important to [name] as you can help [name] better understand your patient needs as well as improve your experience and satisfaction. WE CARE ABOUT YOU!!!   continue to drink plenty of fluids  Continue regular follow-ups with cardiology and gastroenterology  Exercise as much as possible by walking around the house  Decrease sugar in diet as much as possible    Arrie Senate MD

## 2015-01-17 LAB — BMP8+EGFR
BUN / CREAT RATIO: 11 (ref 11–26)
BUN: 26 mg/dL (ref 8–27)
CALCIUM: 9.7 mg/dL (ref 8.7–10.3)
CHLORIDE: 102 mmol/L (ref 97–108)
CO2: 19 mmol/L (ref 18–29)
CREATININE: 2.27 mg/dL — AB (ref 0.57–1.00)
GFR calc non Af Amer: 20 mL/min/{1.73_m2} — ABNORMAL LOW (ref >59)
GFR, EST AFRICAN AMERICAN: 23 mL/min/{1.73_m2} — AB (ref >59)
Glucose: 212 mg/dL — ABNORMAL HIGH (ref 65–99)
POTASSIUM: 5.5 mmol/L — AB (ref 3.5–5.2)
Sodium: 139 mmol/L (ref 134–144)

## 2015-01-17 LAB — VITAMIN D 25 HYDROXY (VIT D DEFICIENCY, FRACTURES): Vit D, 25-Hydroxy: 13.4 ng/mL — ABNORMAL LOW (ref 30.0–100.0)

## 2015-01-17 LAB — NMR, LIPOPROFILE
CHOLESTEROL: 303 mg/dL — AB (ref 100–199)
HDL Cholesterol by NMR: 60 mg/dL (ref >39)
HDL PARTICLE NUMBER: 34.2 umol/L (ref >=30.5)
LDL Particle Number: 1157 nmol/L — ABNORMAL HIGH (ref <1000)
LDL Size: 20.9 nm (ref >20.5)
LDL-C: 178 mg/dL — ABNORMAL HIGH (ref 0–99)
LP-IR Score: 47 — ABNORMAL HIGH (ref <=45)
Small LDL Particle Number: 136 nmol/L (ref <=527)
TRIGLYCERIDES BY NMR: 324 mg/dL — AB (ref 0–149)

## 2015-01-17 LAB — HEPATIC FUNCTION PANEL
ALK PHOS: 258 IU/L — AB (ref 39–117)
ALT: 44 IU/L — ABNORMAL HIGH (ref 0–32)
AST: 58 IU/L — ABNORMAL HIGH (ref 0–40)
Albumin: 3.3 g/dL — ABNORMAL LOW (ref 3.5–4.8)
Bilirubin Total: 0.3 mg/dL (ref 0.0–1.2)
Bilirubin, Direct: 0.12 mg/dL (ref 0.00–0.40)
Total Protein: 6.6 g/dL (ref 6.0–8.5)

## 2015-01-20 ENCOUNTER — Other Ambulatory Visit: Payer: Self-pay | Admitting: Family Medicine

## 2015-02-07 ENCOUNTER — Other Ambulatory Visit: Payer: Medicare Other

## 2015-02-07 DIAGNOSIS — R799 Abnormal finding of blood chemistry, unspecified: Secondary | ICD-10-CM

## 2015-02-08 LAB — BMP8+EGFR
BUN / CREAT RATIO: 12 (ref 11–26)
BUN: 27 mg/dL (ref 8–27)
CALCIUM: 9.4 mg/dL (ref 8.7–10.3)
CHLORIDE: 103 mmol/L (ref 97–108)
CO2: 14 mmol/L — ABNORMAL LOW (ref 18–29)
CREATININE: 2.32 mg/dL — AB (ref 0.57–1.00)
GFR calc non Af Amer: 20 mL/min/{1.73_m2} — ABNORMAL LOW (ref >59)
GFR, EST AFRICAN AMERICAN: 23 mL/min/{1.73_m2} — AB (ref >59)
Glucose: 210 mg/dL — ABNORMAL HIGH (ref 65–99)
Potassium: 5.6 mmol/L — ABNORMAL HIGH (ref 3.5–5.2)
SODIUM: 137 mmol/L (ref 134–144)

## 2015-02-09 ENCOUNTER — Encounter: Payer: Self-pay | Admitting: Family Medicine

## 2015-02-09 ENCOUNTER — Encounter: Payer: Self-pay | Admitting: Hematology and Oncology

## 2015-02-11 ENCOUNTER — Encounter: Payer: Self-pay | Admitting: Hematology and Oncology

## 2015-02-13 ENCOUNTER — Ambulatory Visit: Payer: Self-pay | Admitting: Internal Medicine

## 2015-02-20 ENCOUNTER — Telehealth: Payer: Self-pay

## 2015-02-20 ENCOUNTER — Telehealth: Payer: Self-pay | Admitting: Hematology and Oncology

## 2015-02-20 NOTE — Telephone Encounter (Signed)
Answering my chart messages from husband: S/w husband, don mccarver, about Dr Alvy Bimler advice of labs next week and appt with her on 5/5 at 1pm. He would like an afternoon on Thursday lab appt. POF sent.

## 2015-02-20 NOTE — Telephone Encounter (Signed)
Aware of new appointments per pof

## 2015-02-27 ENCOUNTER — Other Ambulatory Visit (HOSPITAL_BASED_OUTPATIENT_CLINIC_OR_DEPARTMENT_OTHER): Payer: Medicare Other

## 2015-02-27 DIAGNOSIS — N183 Chronic kidney disease, stage 3 unspecified: Secondary | ICD-10-CM

## 2015-02-27 DIAGNOSIS — D472 Monoclonal gammopathy: Secondary | ICD-10-CM

## 2015-02-27 LAB — COMPREHENSIVE METABOLIC PANEL (CC13)
ALBUMIN: 2.8 g/dL — AB (ref 3.5–5.0)
ALT: 55 U/L (ref 0–55)
ANION GAP: 16 meq/L — AB (ref 3–11)
AST: 88 U/L — ABNORMAL HIGH (ref 5–34)
Alkaline Phosphatase: 248 U/L — ABNORMAL HIGH (ref 40–150)
BUN: 28.3 mg/dL — ABNORMAL HIGH (ref 7.0–26.0)
CALCIUM: 9.6 mg/dL (ref 8.4–10.4)
CO2: 17 meq/L — AB (ref 22–29)
Chloride: 108 mEq/L (ref 98–109)
Creatinine: 2.5 mg/dL — ABNORMAL HIGH (ref 0.6–1.1)
EGFR: 18 mL/min/{1.73_m2} — ABNORMAL LOW (ref >90)
GLUCOSE: 101 mg/dL (ref 70–140)
POTASSIUM: 4.4 meq/L (ref 3.5–5.1)
Sodium: 141 mEq/L (ref 136–145)
Total Bilirubin: 0.24 mg/dL (ref 0.20–1.20)
Total Protein: 7.2 g/dL (ref 6.4–8.3)

## 2015-02-27 LAB — CBC WITH DIFFERENTIAL/PLATELET
BASO%: 1.3 % (ref 0.0–2.0)
Basophils Absolute: 0.1 10*3/uL (ref 0.0–0.1)
EOS%: 4 % (ref 0.0–7.0)
Eosinophils Absolute: 0.3 10*3/uL (ref 0.0–0.5)
HCT: 35.8 % (ref 34.8–46.6)
HGB: 11.8 g/dL (ref 11.6–15.9)
LYMPH%: 20.6 % (ref 14.0–49.7)
MCH: 30.7 pg (ref 25.1–34.0)
MCHC: 32.8 g/dL (ref 31.5–36.0)
MCV: 93.5 fL (ref 79.5–101.0)
MONO#: 0.9 10*3/uL (ref 0.1–0.9)
MONO%: 11.4 % (ref 0.0–14.0)
NEUT%: 62.7 % (ref 38.4–76.8)
NEUTROS ABS: 5 10*3/uL (ref 1.5–6.5)
Platelets: 230 10*3/uL (ref 145–400)
RBC: 3.83 10*6/uL (ref 3.70–5.45)
RDW: 14.5 % (ref 11.2–14.5)
WBC: 8 10*3/uL (ref 3.9–10.3)
lymph#: 1.6 10*3/uL (ref 0.9–3.3)

## 2015-02-27 LAB — LACTATE DEHYDROGENASE (CC13): LDH: 190 U/L (ref 125–245)

## 2015-02-28 ENCOUNTER — Other Ambulatory Visit: Payer: Self-pay | Admitting: Family Medicine

## 2015-03-03 LAB — SPEP & IFE WITH QIG
Abnormal Protein Band1: 0.5 g/dL
Albumin ELP: 3.1 g/dL — ABNORMAL LOW (ref 3.8–4.8)
Alpha-1-Globulin: 0.3 g/dL (ref 0.2–0.3)
Alpha-2-Globulin: 1.1 g/dL — ABNORMAL HIGH (ref 0.5–0.9)
BETA 2: 0.7 g/dL — AB (ref 0.2–0.5)
BETA GLOBULIN: 0.4 g/dL (ref 0.4–0.6)
Gamma Globulin: 0.8 g/dL (ref 0.8–1.7)
IGG (IMMUNOGLOBIN G), SERUM: 887 mg/dL (ref 690–1700)
IgA: 584 mg/dL — ABNORMAL HIGH (ref 69–380)
IgM, Serum: 146 mg/dL (ref 52–322)
TOTAL PROTEIN, SERUM ELECTROPHOR: 6.4 g/dL (ref 6.1–8.1)

## 2015-03-03 LAB — KAPPA/LAMBDA LIGHT CHAINS
KAPPA FREE LGHT CHN: 12.5 mg/dL — AB (ref 0.33–1.94)
Kappa:Lambda Ratio: 2.94 — ABNORMAL HIGH (ref 0.26–1.65)
LAMBDA FREE LGHT CHN: 4.25 mg/dL — AB (ref 0.57–2.63)

## 2015-03-03 LAB — BETA 2 MICROGLOBULIN, SERUM: BETA 2 MICROGLOBULIN: 7.67 mg/L — AB (ref <=2.51)

## 2015-03-06 ENCOUNTER — Other Ambulatory Visit: Payer: Medicare Other

## 2015-03-06 ENCOUNTER — Ambulatory Visit (HOSPITAL_BASED_OUTPATIENT_CLINIC_OR_DEPARTMENT_OTHER): Payer: Medicare Other | Admitting: Hematology and Oncology

## 2015-03-06 ENCOUNTER — Telehealth: Payer: Self-pay | Admitting: Hematology and Oncology

## 2015-03-06 ENCOUNTER — Encounter: Payer: Self-pay | Admitting: Hematology and Oncology

## 2015-03-06 VITALS — BP 156/51 | HR 51 | Temp 97.8°F | Resp 18 | Ht 61.0 in | Wt 193.4 lb

## 2015-03-06 DIAGNOSIS — G8929 Other chronic pain: Secondary | ICD-10-CM

## 2015-03-06 DIAGNOSIS — D472 Monoclonal gammopathy: Secondary | ICD-10-CM

## 2015-03-06 DIAGNOSIS — E1165 Type 2 diabetes mellitus with hyperglycemia: Secondary | ICD-10-CM

## 2015-03-06 DIAGNOSIS — N184 Chronic kidney disease, stage 4 (severe): Secondary | ICD-10-CM

## 2015-03-06 DIAGNOSIS — I10 Essential (primary) hypertension: Secondary | ICD-10-CM

## 2015-03-06 DIAGNOSIS — M545 Low back pain: Secondary | ICD-10-CM

## 2015-03-06 DIAGNOSIS — N189 Chronic kidney disease, unspecified: Secondary | ICD-10-CM | POA: Diagnosis not present

## 2015-03-06 DIAGNOSIS — E1140 Type 2 diabetes mellitus with diabetic neuropathy, unspecified: Secondary | ICD-10-CM

## 2015-03-06 NOTE — Assessment & Plan Note (Signed)
The IgA kappa is only marginally elevated compared to prior visit. Clinically, he had no signs of organ damage except the chronic kidney disease which I believe is unrelated to the MGUS. The patient is not even anemic. 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

## 2015-03-06 NOTE — Telephone Encounter (Signed)
Gave and printed appt sched and avs fo rpt for OCT °

## 2015-03-06 NOTE — Progress Notes (Signed)
[facility] OFFICE PROGRESS NOTE  Patient Care Team: Chipper Herb, MD as PCP - General (Family Medicine) Estanislado Emms, MD as Consulting Physician (Nephrology) Heath Lark, MD as Consulting Physician (Hematology and Oncology)  SUMMARY OF ONCOLOGIC HISTORY: [name] is here because of proteinuria, chronic kidney disease, and anemia, concern for monoclonal paraproteinemia She denies history of abnormal bone pain or bone fracture. She has chronic back pain from degenerative arthritis.  Patient denies history of recurrent infection or atypical infections such as shingles of meningitis. Denies chills, night sweats, anorexia or abnormal weight loss. The patient has poorly controlled diabetes. She had renal dysfunction many years ago and was on temporary hemodialysis. The patient have colostomy long-term. She was seen in November 2015 with extensive workup which showed she have IgA kappa MGUS. She is observed.  INTERVAL HISTORY: Please see below for problem oriented charting. She was referred back here because of mildly worsening kidney function, with fear that she may be developing multiple myeloma. Her symptoms are about the same. She continues to have intermittent back pain. She complained of feeling cold and fatigue. Her blood pressure is poorly controlled at home. She denies recent weight loss or night sweats. Denies recent infection.  REVIEW OF SYSTEMS:   Constitutional: Denies fevers, chills or abnormal weight loss Eyes: Denies blurriness of vision Ears, nose, mouth, throat, and face: Denies mucositis or sore throat Respiratory: Denies cough, dyspnea or wheezes Cardiovascular: Denies palpitation, chest discomfort or lower extremity swelling Gastrointestinal:  Denies nausea, heartburn or change in bowel habits Skin: Denies abnormal skin rashes Lymphatics: Denies new lymphadenopathy or easy bruising Neurological:Denies numbness, tingling or new  weaknesses Behavioral/Psych: Mood is stable, no new changes  All other systems were reviewed with the patient and are negative.  I have reviewed the past medical history, past surgical history, social history and family history with the patient and they are unchanged from previous note.  ALLERGIES:  is allergic to ace inhibitors; actos; aspirin; codeine; hydromorphone hcl; metformin and related; penicillins; procaine hcl; statins; and sulfa antibiotics.  MEDICATIONS:  Current Outpatient Prescriptions  Medication Sig Dispense Refill  . acetaminophen (TYLENOL) 500 MG tablet Take 1,000 mg by mouth every 6 (six) hours as needed.     Marland Kitchen amLODipine (NORVASC) 10 MG tablet TAKE 1 TABLET ONCE A DAY 90 tablet 1  . aspirin 81 MG tablet Take 81 mg by mouth daily.    . B Complex-C-Folic Acid (MULTIVITAMIN, STRESS FORMULA) tablet Take 1 tablet by mouth daily.      . bifidobacterium infantis (ALIGN) capsule Take 1 capsule by mouth daily. 14 capsule 0  . guaiFENesin (MUCINEX) 600 MG 12 hr tablet Take 1,200 mg by mouth 2 (two) times daily.    Marland Kitchen HUMALOG MIX 50/50 KWIKPEN (50-50) 100 UNIT/ML Kwikpen INJECT 58 UNITS SQ EVERY MORNING AND 38 UNITS EVERY EVENING 30 mL 4  . insulin lispro (HUMALOG) 100 UNIT/ML injection Inject 100 Units into the skin. INJECT UP TO 12 UNITS BEFORE LUNCH AS DIRECTED BASED ON BLOOD GLUCOSE    . Insulin Lispro Prot & Lispro (HUMALOG MIX 50/50 KWIKPEN) (50-50) 100 UNIT/ML Kwikpen Inject 70 units qam with breakfast and 45 units qpm with supper 40 mL 0  . Insulin Pen Needle (PEN NEEDLES 31GX5/16") 31G X 8 MM MISC Use to give insulin bid 100 each 2  . Lancets (ONETOUCH ULTRASOFT) lancets CHECK BLOOD SUGAR 4 TIMES A DAY 100 each 1  . levothyroxine (SYNTHROID, LEVOTHROID) 50 MCG tablet TAKE  1 TABLET DAILY 30 tablet 11  . loratadine (CLARITIN) 10 MG tablet Take 10 mg by mouth daily as needed for allergies.    Marland Kitchen metoCLOPramide (REGLAN) 5 MG tablet Take 1 tablet (5 mg total) by mouth 2 (two)  times daily. 60 tablet 5  . metoprolol tartrate (LOPRESSOR) 25 MG tablet Take 1.5 tablets (37.5 mg total) by mouth 2 (two) times daily. 90 tablet 4  . mupirocin ointment (BACTROBAN) 2 % APPLY TO AFFECTED AREA AS DIRECTED 22 g PRN  . omega-3 acid ethyl esters (LOVAZA) 1 G capsule TAKE (2) CAPSULES TWICE DAILY. 120 capsule 5  . ondansetron (ZOFRAN) 8 MG tablet Take 1/2 to 1 tab by mouth 3 times daily as needed. Take 1 dose early AM before breakfast. 40 tablet 1  . ONE TOUCH ULTRA TEST test strip Check BS TID and PRN.DX.250.0 100 each 11  . pantoprazole (PROTONIX) 40 MG tablet TAKE  (1)  TABLET TWICE A DAY. 60 tablet 2  . traMADol (ULTRAM) 50 MG tablet Take 1 tablet (50 mg total) by mouth every 6 (six) hours as needed. 60 tablet 0   No current facility-administered medications for this visit.    PHYSICAL EXAMINATION: ECOG PERFORMANCE STATUS: 1 - Symptomatic but completely ambulatory  Filed Vitals:   03/06/15 1229  BP: 156/51  Pulse: 51  Temp: 97.8 F (36.6 C)  Resp: 18   Filed Weights   03/06/15 1229  Weight: 193 lb 6.4 oz (87.726 kg)    GENERAL:alert, no distress and comfortable SKIN: skin color, texture, turgor are normal, no rashes or significant lesions EYES: normal, Conjunctiva are pink and non-injected, sclera clear ABDOMEN:abdomen soft, non-tender and normal bowel sounds Musculoskeletal:no cyanosis of digits and no clubbing  NEURO: alert & oriented x 3 with fluent speech, no focal motor/sensory deficits  LABORATORY DATA:  I have reviewed the data as listed    Component Value Date/Time   NA 141 02/27/2015 1350   NA 137 02/07/2015 1106   NA 139 03/29/2013 1240   K 4.4 02/27/2015 1350   K 5.6* 02/07/2015 1106   CL 103 02/07/2015 1106   CO2 17* 02/27/2015 1350   CO2 14* 02/07/2015 1106   GLUCOSE 101 02/27/2015 1350   GLUCOSE 210* 02/07/2015 1106   GLUCOSE 191* 03/29/2013 1240   BUN 28.3* 02/27/2015 1350   BUN 27 02/07/2015 1106   BUN 17 03/29/2013 1240    CREATININE 2.5* 02/27/2015 1350   CREATININE 2.32* 02/07/2015 1106   CREATININE 1.36* 03/29/2013 1240   CALCIUM 9.6 02/27/2015 1350   CALCIUM 9.4 02/07/2015 1106   CALCIUM 8.1* 08/27/2009 1319   PROT 7.2 02/27/2015 1350   PROT 6.6 01/16/2015 0937   PROT 7.3 09/24/2014 1256   ALBUMIN 2.8* 02/27/2015 1350   ALBUMIN 3.3* 09/24/2014 1256   AST 88* 02/27/2015 1350   AST 58* 01/16/2015 0937   ALT 55 02/27/2015 1350   ALT 44* 01/16/2015 0937   ALKPHOS 248* 02/27/2015 1350   ALKPHOS 258* 01/16/2015 0937   BILITOT 0.24 02/27/2015 1350   BILITOT 0.3 01/16/2015 0937   BILITOT 0.4 09/24/2014 1256   GFRNONAA 20* 02/07/2015 1106   GFRNONAA 38* 03/29/2013 1240   GFRAA 23* 02/07/2015 1106   GFRAA 44* 03/29/2013 1240    No results found for: SPEP, UPEP  Lab Results  Component Value Date   WBC 8.0 02/27/2015   NEUTROABS 5.0 02/27/2015   HGB 11.8 02/27/2015   HCT 35.8 02/27/2015   MCV 93.5 02/27/2015   PLT  230 02/27/2015      Chemistry      Component Value Date/Time   NA 141 02/27/2015 1350   NA 137 02/07/2015 1106   NA 139 03/29/2013 1240   K 4.4 02/27/2015 1350   K 5.6* 02/07/2015 1106   CL 103 02/07/2015 1106   CO2 17* 02/27/2015 1350   CO2 14* 02/07/2015 1106   BUN 28.3* 02/27/2015 1350   BUN 27 02/07/2015 1106   BUN 17 03/29/2013 1240   CREATININE 2.5* 02/27/2015 1350   CREATININE 2.32* 02/07/2015 1106   CREATININE 1.36* 03/29/2013 1240      Component Value Date/Time   CALCIUM 9.6 02/27/2015 1350   CALCIUM 9.4 02/07/2015 1106   CALCIUM 8.1* 08/27/2009 1319   ALKPHOS 248* 02/27/2015 1350   ALKPHOS 258* 01/16/2015 0937   AST 88* 02/27/2015 1350   AST 58* 01/16/2015 0937   ALT 55 02/27/2015 1350   ALT 44* 01/16/2015 0937   BILITOT 0.24 02/27/2015 1350   BILITOT 0.3 01/16/2015 0937   BILITOT 0.4 09/24/2014 1256      ASSESSMENT & PLAN:  MGUS (monoclonal gammopathy of unknown significance) The IgA kappa is only marginally elevated compared to prior  visit. Clinically, he had no signs of organ damage except the chronic kidney disease which I believe is unrelated to the MGUS. The patient is not even anemic. 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   Chronic kidney disease This is likely due to diabetic nephropathy and poorly controlled hypertension She will continue close follow-up with her nephrologist.        Her husband had multiple questions regarding concerns about her having fatigue and feeling cold. The patient is quite deconditioned with very poor baseline performance status. She sleeps for most part of the day. The patient is not anemic. Her last thyroid function tests were within normal limits. Again, I do not believe her symptoms are related to multiple myeloma. The incidence of MGUS at her age range is quite high, could be up to 10%. Only 1% per year of patients will progress to the point of needing treatment. He also asked about cancer insurance policy. MGUS could be consider a follow-up malignancy. I'll be happy to help her fill out some paperwork if to see if she will qualify to get compensation from the cancer insurance policy.  Orders Placed This Encounter  Procedures  . DG Bone Survey Met    Standing Status: Future     Number of Occurrences:      Standing Expiration Date: 05/05/2016    Order Specific Question:  Reason for Exam (SYMPTOM  OR DIAGNOSIS REQUIRED)    Answer:  myeloma staging    Order Specific Question:  Preferred imaging location?    Answer:  [facility]  . Beta 2 microglobulin, serum    Standing Status: Future     Number of Occurrences:      Standing Expiration Date: 05/05/2016  . Immunofixation interpretive, urine    Standing Status: Future     Number of Occurrences:      Standing Expiration Date: 05/05/2016  . IFE, Urine (with Tot Prot)    Standing Status: Future     Number of Occurrences:      Standing  Expiration Date: 05/05/2016  . SPEP & IFE with QIG    Standing Status: Future     Number of Occurrences:      Standing Expiration  Date: 05/05/2016  . Kappa/lambda light chains    Standing Status: Future     Number of Occurrences:      Standing Expiration Date: 05/05/2016  . Protein electrophoresis, serum    Standing Status: Future     Number of Occurrences:      Standing Expiration Date: 05/05/2016  . Protein Electro, 24-Hour Urine    Standing Status: Future     Number of Occurrences:      Standing Expiration Date: 05/05/2016  . CBC with Differential/Platelet    Standing Status: Future     Number of Occurrences:      Standing Expiration Date: 05/05/2016  . Comprehensive metabolic panel    Standing Status: Future     Number of Occurrences:      Standing Expiration Date: 05/05/2016  . Lactate dehydrogenase    Standing Status: Future     Number of Occurrences:      Standing Expiration Date: 05/05/2016   All questions were answered. The patient knows to call the clinic with any problems, questions or concerns. No barriers to learning was detected. I spent 25 minutes counseling the patient face to face. The total time spent in the appointment was 30 minutes and more than 50% was on counseling and review of test results     [facility], [place], MD 03/06/2015 1:31 PM

## 2015-03-06 NOTE — Assessment & Plan Note (Signed)
This is likely due to diabetic nephropathy and poorly controlled hypertension She will continue close follow-up with her nephrologist.

## 2015-03-09 ENCOUNTER — Encounter (HOSPITAL_COMMUNITY): Payer: Self-pay

## 2015-03-09 ENCOUNTER — Emergency Department (HOSPITAL_COMMUNITY)
Admission: EM | Admit: 2015-03-09 | Discharge: 2015-03-09 | Disposition: A | Discharge status: Home / Self Care | Payer: Medicare Other | Attending: Emergency Medicine | Admitting: Emergency Medicine

## 2015-03-09 ENCOUNTER — Other Ambulatory Visit: Payer: Self-pay

## 2015-03-09 ENCOUNTER — Emergency Department (HOSPITAL_COMMUNITY): Payer: Medicare Other

## 2015-03-09 DIAGNOSIS — M1990 Unspecified osteoarthritis, unspecified site: Secondary | ICD-10-CM | POA: Diagnosis not present

## 2015-03-09 DIAGNOSIS — Z8673 Personal history of transient ischemic attack (TIA), and cerebral infarction without residual deficits: Secondary | ICD-10-CM | POA: Diagnosis not present

## 2015-03-09 DIAGNOSIS — I129 Hypertensive chronic kidney disease with stage 1 through stage 4 chronic kidney disease, or unspecified chronic kidney disease: Secondary | ICD-10-CM | POA: Diagnosis not present

## 2015-03-09 DIAGNOSIS — I509 Heart failure, unspecified: Secondary | ICD-10-CM | POA: Diagnosis not present

## 2015-03-09 DIAGNOSIS — Z7982 Long term (current) use of aspirin: Secondary | ICD-10-CM | POA: Insufficient documentation

## 2015-03-09 DIAGNOSIS — Z79899 Other long term (current) drug therapy: Secondary | ICD-10-CM | POA: Insufficient documentation

## 2015-03-09 DIAGNOSIS — Z794 Long term (current) use of insulin: Secondary | ICD-10-CM | POA: Diagnosis not present

## 2015-03-09 DIAGNOSIS — R079 Chest pain, unspecified: Secondary | ICD-10-CM

## 2015-03-09 DIAGNOSIS — Z8614 Personal history of Methicillin resistant Staphylococcus aureus infection: Secondary | ICD-10-CM | POA: Insufficient documentation

## 2015-03-09 DIAGNOSIS — R112 Nausea with vomiting, unspecified: Secondary | ICD-10-CM

## 2015-03-09 DIAGNOSIS — N189 Chronic kidney disease, unspecified: Secondary | ICD-10-CM | POA: Diagnosis not present

## 2015-03-09 DIAGNOSIS — Z8739 Personal history of other diseases of the musculoskeletal system and connective tissue: Secondary | ICD-10-CM | POA: Insufficient documentation

## 2015-03-09 DIAGNOSIS — R5383 Other fatigue: Secondary | ICD-10-CM | POA: Insufficient documentation

## 2015-03-09 DIAGNOSIS — Z9889 Other specified postprocedural states: Secondary | ICD-10-CM | POA: Insufficient documentation

## 2015-03-09 DIAGNOSIS — E119 Type 2 diabetes mellitus without complications: Secondary | ICD-10-CM | POA: Insufficient documentation

## 2015-03-09 DIAGNOSIS — Z880 Allergy status to penicillin: Secondary | ICD-10-CM | POA: Diagnosis not present

## 2015-03-09 DIAGNOSIS — Z862 Personal history of diseases of the blood and blood-forming organs and certain disorders involving the immune mechanism: Secondary | ICD-10-CM | POA: Insufficient documentation

## 2015-03-09 DIAGNOSIS — Z8719 Personal history of other diseases of the digestive system: Secondary | ICD-10-CM | POA: Insufficient documentation

## 2015-03-09 DIAGNOSIS — R0602 Shortness of breath: Secondary | ICD-10-CM | POA: Insufficient documentation

## 2015-03-09 LAB — BASIC METABOLIC PANEL
Anion gap: 8 (ref 5–15)
BUN: 26 mg/dL — ABNORMAL HIGH (ref 6–20)
CALCIUM: 8.7 mg/dL — AB (ref 8.9–10.3)
CO2: 16 mmol/L — AB (ref 22–32)
Chloride: 112 mmol/L — ABNORMAL HIGH (ref 101–111)
Creatinine, Ser: 2.49 mg/dL — ABNORMAL HIGH (ref 0.44–1.00)
GFR calc Af Amer: 20 mL/min — ABNORMAL LOW (ref >60)
GFR, EST NON AFRICAN AMERICAN: 18 mL/min — AB (ref >60)
Glucose, Bld: 180 mg/dL — ABNORMAL HIGH (ref 70–99)
Potassium: 4.8 mmol/L (ref 3.5–5.1)
SODIUM: 136 mmol/L (ref 135–145)

## 2015-03-09 LAB — URINALYSIS, ROUTINE W REFLEX MICROSCOPIC
Bilirubin Urine: NEGATIVE
GLUCOSE, UA: 100 mg/dL — AB
Ketones, ur: NEGATIVE mg/dL
LEUKOCYTES UA: NEGATIVE
Nitrite: NEGATIVE
Protein, ur: >300 mg/dL — AB
SPECIFIC GRAVITY, URINE: 1.013 (ref 1.005–1.030)
Urobilinogen, UA: 0.2 mg/dL (ref 0.0–1.0)
pH: 5 (ref 5.0–8.0)

## 2015-03-09 LAB — I-STAT TROPONIN, ED: TROPONIN I, POC: 0.03 ng/mL (ref 0.00–0.08)

## 2015-03-09 LAB — CBC
HCT: 32.4 % — ABNORMAL LOW (ref 36.0–46.0)
Hemoglobin: 10.7 g/dL — ABNORMAL LOW (ref 12.0–15.0)
MCH: 31.2 pg (ref 26.0–34.0)
MCHC: 33 g/dL (ref 30.0–36.0)
MCV: 94.5 fL (ref 78.0–100.0)
Platelets: 212 10*3/uL (ref 150–400)
RBC: 3.43 MIL/uL — AB (ref 3.87–5.11)
RDW: 14.1 % (ref 11.5–15.5)
WBC: 17.1 10*3/uL — AB (ref 4.0–10.5)

## 2015-03-09 LAB — BRAIN NATRIURETIC PEPTIDE: B NATRIURETIC PEPTIDE 5: 257.8 pg/mL — AB (ref 0.0–100.0)

## 2015-03-09 LAB — URINE MICROSCOPIC-ADD ON

## 2015-03-09 MED ORDER — ONDANSETRON HCL 4 MG/2ML IJ SOLN
4.0000 mg | Freq: Once | INTRAMUSCULAR | Status: AC
  Administered 2015-03-09: 4 mg via INTRAVENOUS
  Filled 2015-03-09: qty 2

## 2015-03-09 MED ORDER — ASPIRIN 325 MG PO TABS: 325.0000 mg | ORAL_TABLET | Freq: Once | ORAL | Status: DC

## 2015-03-09 MED ORDER — ASPIRIN EC 325 MG PO TBEC
325.0000 mg | DELAYED_RELEASE_TABLET | Freq: Once | ORAL | Status: AC
  Administered 2015-03-09: 325 mg via ORAL
  Filled 2015-03-09: qty 1

## 2015-03-09 MED ORDER — NITROGLYCERIN 0.4 MG SL SUBL: 0.4000 mg | SUBLINGUAL_TABLET | SUBLINGUAL | Status: DC | PRN

## 2015-03-09 MED ORDER — GI COCKTAIL ~~LOC~~
30.0000 mL | Freq: Once | ORAL | Status: AC
  Administered 2015-03-09: 30 mL via ORAL
  Filled 2015-03-09: qty 30

## 2015-03-09 NOTE — ED Notes (Signed)
Patient arrives via EMS from home with complaints of chest pain.  Reports she had a fever at home as well.  She says she has vomited several times since pain started, which is normal for her.

## 2015-03-09 NOTE — ED Provider Notes (Signed)
CSN: [id number]     Arrival date & time 03/09/15  0131 History   First MD Initiated Contact with Patient 03/09/15 [phone]     Chief Complaint  Patient presents with  . Chest Pain     (Consider location/radiation/quality/duration/timing/severity/associated sxs/prior Treatment) Patient is a 79 y.o. female presenting with chest pain.  Chest Pain Pain location:  Substernal area, L chest and R chest Pain quality: dull   Pain radiates to:  Does not radiate Pain radiates to the back: no   Pain severity:  Moderate Onset quality:  Sudden Duration:  7 hours Timing:  Constant Progression:  Unchanged Chronicity:  New Context: at rest   Relieved by:  Nothing Worsened by:  Deep breathing (palpation) Ineffective treatments:  None tried Associated symptoms: fatigue, nausea, shortness of breath and vomiting   Associated symptoms: no abdominal pain, no cough and no fever     Past Medical History  Diagnosis Date  . Diverticulitis 2005    Colonoscopy--Dr. Sharlett Iles   . Diabetes mellitus without complication   . Hyperlipidemia   . Hypertension   . Stroke     Past history  . Hepatic steatosis   . Anemia   . Arthritis   . Chronic kidney disease (CKD)     past dialysis  . CHF (congestive heart failure)   . Status post dilation of esophageal narrowing   . MRSA infection 07/2009  . Proteinuria 09/23/2014  . MGUS (monoclonal gammopathy of unknown significance) 10/10/2014   Past Surgical History  Procedure Laterality Date  . Bilateral oophorectomy    . Rotator cuff repair Bilateral   . Carpal tunnel repair Bilateral   . Laparoscopic colostomy      for diverticulitis  . Nasal sinus surgery    . Tonsillectomy    . Replacement total knee Right   . Abdominal hernia repair      with ilestomy  . Partial colectomy     Family History  Problem Relation Age of Onset  . Diabetes Mother   . Heart disease Father   . Diabetes Sister   . Colon cancer Neg Hx    History  Substance Use Topics   . Smoking status: Never Smoker   . Smokeless tobacco: Never Used  . Alcohol Use: No   OB History    No data available     Review of Systems  Constitutional: Positive for fatigue. Negative for fever.  Respiratory: Positive for shortness of breath. Negative for cough.   Cardiovascular: Positive for chest pain.  Gastrointestinal: Positive for nausea and vomiting. Negative for abdominal pain.  All other systems reviewed and are negative.     Allergies  Ace inhibitors; Actos; Aspirin; Codeine; Hydromorphone hcl; Metformin and related; Penicillins; Procaine hcl; Statins; and Sulfa antibiotics  Home Medications   Prior to Admission medications   Medication Sig Start Date End Date Taking? Authorizing Provider  acetaminophen (TYLENOL) 500 MG tablet Take 1,000 mg by mouth every 6 (six) hours as needed for moderate pain.    Yes Historical Provider, MD  amLODipine (NORVASC) 10 MG tablet TAKE 1 TABLET ONCE A DAY 01/20/15  Yes Chipper Herb, MD  aspirin EC 81 MG tablet Take 81 mg by mouth daily.   Yes Historical Provider, MD  B Complex-C-Folic Acid (MULTIVITAMIN, STRESS FORMULA) tablet Take 1 tablet by mouth daily.     Yes Historical Provider, MD  bifidobacterium infantis (ALIGN) capsule Take 1 capsule by mouth daily. 10/11/14  Yes Jerene Bears, MD  guaiFENesin (  MUCINEX) 600 MG 12 hr tablet Take 1,200 mg by mouth 2 (two) times daily as needed for cough or to loosen phlegm.    Yes Historical Provider, MD  insulin lispro (HUMALOG) 100 UNIT/ML injection Inject 5-12 Units into the skin daily. Sliding scale   Yes Historical Provider, MD  Insulin Lispro Prot & Lispro (HUMALOG MIX 50/50 KWIKPEN) (50-50) 100 UNIT/ML Kwikpen Inject 70 units qam with breakfast and 45 units qpm with supper 10/16/14  Yes Tammy Eckard, PHARMD  levothyroxine (SYNTHROID, LEVOTHROID) 50 MCG tablet TAKE 1 TABLET DAILY 09/19/14  Yes Chipper Herb, MD  metoCLOPramide (REGLAN) 5 MG tablet Take 1 tablet (5 mg total) by mouth 2  (two) times daily. 10/11/14  Yes Jerene Bears, MD  metoprolol tartrate (LOPRESSOR) 25 MG tablet Take 1.5 tablets (37.5 mg total) by mouth 2 (two) times daily. 07/25/14  Yes Lysbeth Penner, FNP  mupirocin ointment (BACTROBAN) 2 % APPLY TO AFFECTED AREA AS DIRECTED 10/08/14  Yes Chipper Herb, MD  omega-3 acid ethyl esters (LOVAZA) 1 G capsule TAKE (2) CAPSULES TWICE DAILY. 09/06/14  Yes Chipper Herb, MD  ondansetron (ZOFRAN) 8 MG tablet Take 1/2 to 1 tab by mouth 3 times daily as needed. Take 1 dose early AM before breakfast. Patient taking differently: Take 4-8 mg by mouth every 8 (eight) hours as needed for nausea. Take 1/2 to 1 tab by mouth 3 times daily as needed. Take 1 dose early AM before breakfast. 01/13/15  Yes Amy S Esterwood, PA-C  pantoprazole (PROTONIX) 40 MG tablet TAKE  (1)  TABLET TWICE A DAY. 12/20/14  Yes Chipper Herb, MD  Polyethyl Glycol-Propyl Glycol 0.4-0.3 % SOLN Apply 1 drop to eye 3 (three) times daily as needed (dry eyes).   Yes Historical Provider, MD  traMADol (ULTRAM) 50 MG tablet Take 1 tablet (50 mg total) by mouth every 6 (six) hours as needed. Patient taking differently: Take 50 mg by mouth every 6 (six) hours as needed for moderate pain.  11/06/13  Yes Sable Feil, MD  HUMALOG MIX 50/50 KWIKPEN (50-50) 100 UNIT/ML Kwikpen INJECT 89 UNITS SQ EVERY MORNING AND 38 UNITS EVERY EVENING Patient not taking: Reported on 03/09/2015 03/01/15   Chipper Herb, MD  Insulin Pen Needle (PEN NEEDLES 31GX5/16") 31G X 8 MM MISC Use to give insulin bid 01/02/15   Chipper Herb, MD  Lancets [facility] ULTRASOFT) lancets CHECK BLOOD SUGAR 4 TIMES A DAY 08/19/14   Chipper Herb, MD  ONE TOUCH ULTRA TEST test strip Check BS TID and [id number]    Chipper Herb, MD   BP 181/55 mmHg  Pulse 76  Temp(Src) 99.7 F (37.6 C) (Oral)  Resp 20  SpO2 95% Physical Exam  Constitutional: She is oriented to person, place, and time. She appears well-developed and well-nourished.  HENT:  Head:  Normocephalic and atraumatic.  Right Ear: External ear normal.  Left Ear: External ear normal.  Eyes: Conjunctivae and EOM are normal. Pupils are equal, round, and reactive to light.  Neck: Normal range of motion. Neck supple.  Cardiovascular: Normal rate, regular rhythm, normal heart sounds and intact distal pulses.   Pulmonary/Chest: Effort normal and breath sounds normal.  Abdominal: Soft. Bowel sounds are normal. There is no tenderness.  Musculoskeletal: Normal range of motion.  Neurological: She is alert and oriented to person, place, and time.  Skin: Skin is warm and dry.  Vitals reviewed.   ED Course  Procedures (including critical care time)  Labs Review Labs Reviewed  CBC - Abnormal; Notable for the following:    WBC 17.1 (*)    RBC 3.43 (*)    Hemoglobin 10.7 (*)    HCT 32.4 (*)    All other components within normal limits  BASIC METABOLIC PANEL - Abnormal; Notable for the following:    Chloride 112 (*)    CO2 16 (*)    Glucose, Bld 180 (*)    BUN 26 (*)    Creatinine, Ser 2.49 (*)    Calcium 8.7 (*)    GFR calc non Af Amer 18 (*)    GFR calc Af Amer 20 (*)    All other components within normal limits  BRAIN NATRIURETIC PEPTIDE - Abnormal; Notable for the following:    B Natriuretic Peptide 257.8 (*)    All other components within normal limits  URINALYSIS, ROUTINE W REFLEX MICROSCOPIC - Abnormal; Notable for the following:    Glucose, UA 100 (*)    Hgb urine dipstick TRACE (*)    Protein, ur >300 (*)    All other components within normal limits  URINE MICROSCOPIC-ADD ON - Abnormal; Notable for the following:    Casts GRANULAR CAST (*)    All other components within normal limits  Randolm Idol, ED    Imaging Review Dg Chest Port 1 View  03/09/2015   CLINICAL DATA:  Chest pain.  EXAM: PORTABLE CHEST - 1 VIEW  COMPARISON:  09/03/2013  FINDINGS: A single AP portable view of the chest demonstrates no focal airspace consolidation or alveolar edema. The lungs  are grossly clear. There is no large effusion or pneumothorax. Cardiac and mediastinal contours appear unremarkable.  IMPRESSION: No active disease.   Electronically Signed   By: Andreas Newport M.D.   On: 03/09/2015 02:44     EKG Interpretation None      MDM   Final diagnoses:  Chest pain, unspecified chest pain type  Non-intractable vomiting with nausea, vomiting of unspecified type    79 y.o. female with pertinent PMH of CHF (nonischemic), CKD st 3, MGUS, HTN, CVA (no residual deficits), DM presents with chest pain, n/v x 9.  Patient has a recurrent history of identical symptoms in the past which have been attributed to diabetic gastroparesis. On arrival vitals and physical exam as above.  Patient in minimal to no pain, states her back is uncomfortable from laying in the bed. She denies tearing sensation, states that she intermittently has identical symptoms, was just concerned because they lasted longer than normal this time.  Workup today as above unremarkable. Symptoms completely resolved with GI cocktail. First troponin greater than 6 hours from onset of symptoms. Although history is concerning, unremarkable workup now with combination of similar symptoms in the past make ACS, other emergent pathology such as aortic dissection unlikely. I discussed potential admission, with shared decision-making we agreed to discharge home.  Patient was given strict return precautions for any worsening symptoms, voiced understanding and agreed to follow-up.  I have reviewed all laboratory and imaging studies if ordered as above  1. Chest pain, unspecified chest pain type   2. Non-intractable vomiting with nausea, vomiting of unspecified type         Debby Freiberg, MD 03/09/15 [phone]

## 2015-03-09 NOTE — ED Notes (Signed)
Bed: EN40 Expected date:  Expected time:  Means of arrival:  Comments: EMS 79yo F chest pain

## 2015-03-09 NOTE — Discharge Instructions (Signed)
Chest Pain (Nonspecific) °It is often hard to give a specific diagnosis for the cause of chest pain. There is always a chance that your pain could be related to something serious, such as a heart attack or a blood clot in the lungs. You need to follow up with your health care provider for further evaluation. °CAUSES  °· Heartburn. °· Pneumonia or bronchitis. °· Anxiety or stress. °· Inflammation around your heart (pericarditis) or lung (pleuritis or pleurisy). °· A blood clot in the lung. °· A collapsed lung (pneumothorax). It can develop suddenly on its own (spontaneous pneumothorax) or from trauma to the chest. °· Shingles infection (herpes zoster virus). °The chest wall is composed of bones, muscles, and cartilage. Any of these can be the source of the pain. °· The bones can be bruised by injury. °· The muscles or cartilage can be strained by coughing or overwork. °· The cartilage can be affected by inflammation and become sore (costochondritis). °DIAGNOSIS  °Lab tests or other studies may be needed to find the cause of your pain. Your health care provider may have you take a test called an ambulatory electrocardiogram (ECG). An ECG records your heartbeat patterns over a 24-hour period. You may also have other tests, such as: °· Transthoracic echocardiogram (TTE). During echocardiography, sound waves are used to evaluate how blood flows through your heart. °· Transesophageal echocardiogram (TEE). °· Cardiac monitoring. This allows your health care provider to monitor your heart rate and rhythm in real time. °· Holter monitor. This is a portable device that records your heartbeat and can help diagnose heart arrhythmias. It allows your health care provider to track your heart activity for several days, if needed. °· Stress tests by exercise or by giving medicine that makes the heart beat faster. °TREATMENT  °· Treatment depends on what may be causing your chest pain. Treatment may include: °· Acid blockers for  heartburn. °· Anti-inflammatory medicine. °· Pain medicine for inflammatory conditions. °· Antibiotics if an infection is present. °· You may be advised to change lifestyle habits. This includes stopping smoking and avoiding alcohol, caffeine, and chocolate. °· You may be advised to keep your head raised (elevated) when sleeping. This reduces the chance of acid going backward from your stomach into your esophagus. °Most of the time, nonspecific chest pain will improve within 2-3 days with rest and mild pain medicine.  °HOME CARE INSTRUCTIONS  °· If antibiotics were prescribed, take them as directed. Finish them even if you start to feel better. °· For the next few days, avoid physical activities that bring on chest pain. Continue physical activities as directed. °· Do not use any tobacco products, including cigarettes, chewing tobacco, or electronic cigarettes. °· Avoid drinking alcohol. °· Only take medicine as directed by your health care provider. °· Follow your health care provider's suggestions for further testing if your chest pain does not go away. °· Keep any follow-up appointments you made. If you do not go to an appointment, you could develop lasting (chronic) problems with pain. If there is any problem keeping an appointment, call to reschedule. °SEEK MEDICAL CARE IF:  °· Your chest pain does not go away, even after treatment. °· You have a rash with blisters on your chest. °· You have a fever. °SEEK IMMEDIATE MEDICAL CARE IF:  °· You have increased chest pain or pain that spreads to your arm, neck, jaw, back, or abdomen. °· You have shortness of breath. °· You have an increasing cough, or you cough   up blood.  You have severe back or abdominal pain.  You feel nauseous or vomit.  You have severe weakness.  You faint.  You have chills. This is an emergency. Do not wait to see if the pain will go away. Get medical help at once. Call your local emergency services (911 in [place] Do not drive  yourself to the hospital. MAKE SURE YOU:   Understand these instructions.  Will watch your condition.  Will get help right away if you are not doing well or get worse. Document Released: 07/28/2005 Document Revised: 10/23/2013 Document Reviewed: 05/23/2008 [facility] Patient Information 2015 [place], [place]. This information is not intended to replace advice given to you by your health care provider. Make sure you discuss any questions you have with your health care provider. Gastritis, Adult Gastritis is soreness and swelling (inflammation) of the lining of the stomach. Gastritis can develop as a sudden onset (acute) or long-term (chronic) condition. If gastritis is not treated, it can lead to stomach bleeding and ulcers. CAUSES  Gastritis occurs when the stomach lining is weak or damaged. Digestive juices from the stomach then inflame the weakened stomach lining. The stomach lining may be weak or damaged due to viral or bacterial infections. One common bacterial infection is the Helicobacter pylori infection. Gastritis can also result from excessive alcohol consumption, taking certain medicines, or having too much acid in the stomach.  SYMPTOMS  In some cases, there are no symptoms. When symptoms are present, they may include:  Pain or a burning sensation in the upper abdomen.  Nausea.  Vomiting.  An uncomfortable feeling of fullness after eating. DIAGNOSIS  Your caregiver may suspect you have gastritis based on your symptoms and a physical exam. To determine the cause of your gastritis, your caregiver may perform the following:  Blood or stool tests to check for the H pylori bacterium.  Gastroscopy. A thin, flexible tube (endoscope) is passed down the esophagus and into the stomach. The endoscope has a light and camera on the end. Your caregiver uses the endoscope to view the inside of the stomach.  Taking a tissue sample (biopsy) from the stomach to examine under a  microscope. TREATMENT  Depending on the cause of your gastritis, medicines may be prescribed. If you have a bacterial infection, such as an H pylori infection, antibiotics may be given. If your gastritis is caused by too much acid in the stomach, H2 blockers or antacids may be given. Your caregiver may recommend that you stop taking aspirin, ibuprofen, or other nonsteroidal anti-inflammatory drugs (NSAIDs). HOME CARE INSTRUCTIONS  Only take over-the-counter or prescription medicines as directed by your caregiver.  If you were given antibiotic medicines, take them as directed. Finish them even if you start to feel better.  Drink enough fluids to keep your urine clear or pale yellow.  Avoid foods and drinks that make your symptoms worse, such as:  Caffeine or alcoholic drinks.  Chocolate.  Peppermint or mint flavorings.  Garlic and onions.  Spicy foods.  Citrus fruits, such as oranges, lemons, or limes.  Tomato-based foods such as sauce, chili, salsa, and pizza.  Fried and fatty foods.  Eat small, frequent meals instead of large meals. SEEK IMMEDIATE MEDICAL CARE IF:   You have black or dark red stools.  You vomit blood or material that looks like coffee grounds.  You are unable to keep fluids down.  Your abdominal pain gets worse.  You have a fever.  You do not feel better after  1 week.  You have any other questions or concerns. MAKE SURE YOU:  Understand these instructions.  Will watch your condition.  Will get help right away if you are not doing well or get worse. Document Released: 10/12/2001 Document Revised: 04/18/2012 Document Reviewed: 12/01/2011 [facility] Patient Information 2015 [place], [place]. This information is not intended to replace advice given to you by your health care provider. Make sure you discuss any questions you have with your health care provider.

## 2015-03-09 NOTE — ED Notes (Signed)
Dr. Gentry at bedside. 

## 2015-03-10 ENCOUNTER — Encounter: Payer: Self-pay | Admitting: Internal Medicine

## 2015-03-19 ENCOUNTER — Encounter: Payer: Self-pay | Admitting: Family Medicine

## 2015-03-20 ENCOUNTER — Encounter: Payer: Self-pay | Admitting: *Deleted

## 2015-03-20 ENCOUNTER — Ambulatory Visit (INDEPENDENT_AMBULATORY_CARE_PROVIDER_SITE_OTHER): Payer: Medicare Other | Admitting: Family Medicine

## 2015-03-20 VITALS — BP 157/68 | HR 66 | Temp 97.4°F | Ht 61.0 in | Wt 193.0 lb

## 2015-03-20 DIAGNOSIS — N184 Chronic kidney disease, stage 4 (severe): Secondary | ICD-10-CM | POA: Diagnosis not present

## 2015-03-20 DIAGNOSIS — R531 Weakness: Secondary | ICD-10-CM

## 2015-03-20 DIAGNOSIS — R52 Pain, unspecified: Secondary | ICD-10-CM | POA: Diagnosis not present

## 2015-03-20 DIAGNOSIS — B349 Viral infection, unspecified: Secondary | ICD-10-CM | POA: Diagnosis not present

## 2015-03-20 DIAGNOSIS — D72829 Elevated white blood cell count, unspecified: Secondary | ICD-10-CM

## 2015-03-20 DIAGNOSIS — R300 Dysuria: Secondary | ICD-10-CM

## 2015-03-20 LAB — POCT UA - MICROSCOPIC ONLY
BACTERIA, U MICROSCOPIC: NEGATIVE
CASTS, UR, LPF, POC: NEGATIVE
CRYSTALS, UR, HPF, POC: NEGATIVE
Yeast, UA: NEGATIVE

## 2015-03-20 LAB — POCT URINALYSIS DIPSTICK
Bilirubin, UA: NEGATIVE
Glucose, UA: NEGATIVE
KETONES UA: NEGATIVE
NITRITE UA: NEGATIVE
PH UA: 5
Spec Grav, UA: 1.025
UROBILINOGEN UA: NEGATIVE

## 2015-03-20 LAB — POCT CBC
GRANULOCYTE PERCENT: 68.3 % (ref 37–80)
HEMATOCRIT: 37.5 % — AB (ref 37.7–47.9)
HEMOGLOBIN: 11.8 g/dL — AB (ref 12.2–16.2)
Lymph, poc: 2.2 (ref 0.6–3.4)
MCH: 29.5 pg (ref 27–31.2)
MCHC: 31.5 g/dL — AB (ref 31.8–35.4)
MCV: 93.7 fL (ref 80–97)
MPV: 8.1 fL (ref 0–99.8)
PLATELET COUNT, POC: 275 10*3/uL (ref 142–424)
POC Granulocyte: 6.5 (ref 2–6.9)
POC LYMPH PERCENT: 22.9 %L (ref 10–50)
RBC: 4 M/uL — AB (ref 4.04–5.48)
RDW, POC: 14.3 %
WBC: 9.5 10*3/uL (ref 4.6–10.2)

## 2015-03-20 NOTE — Patient Instructions (Signed)
The patient will continue to try to drink plenty of fluids and get as much rest as possible over the next few days to give her body a chance to recover from what appears to be a viral type syndrome this weekend. She has chronic kidney disease and she must continued to follow-up with the nephrologist and with the hematologist as planned. We will ask her to come back in 2-3 weeks and get another BMP. The CBC that was repeated today showed improvement in the white blood cell count and confirms that you had some kind of viral type syndrome this weekend because the elevated white blood cell count and the weakness and nausea and vomiting The urine specimen without symptoms is within normal limits today also.

## 2015-03-20 NOTE — Progress Notes (Signed)
Subjective:    Patient ID: [name], female    DOB: 1936/08/11, 79 y.o.   MRN: [id number]  HPI Patient here today for general body aches, weakness and nausea. She is accompanied today by her husband. Procedure note this morning from her husband indicating that he is taking his wife to the emergency room this weekend and it was decided that she would be better off if she went back home. He took her because of increased somnolence, fatigue, nausea and weakness. Lab work and x-rays from the hospital visit were reviewed before the visit today. All of this was rediscussed with the patient and her husband. The biggest problems with [name] are that she has chronic kidney disease and she had this most recent what appears to be viral infection that we can her over the weekend and she'll still hasn't recovered from this completely. She also has problems with her blood sugar going up and down and the blood sugar was low last night and she had to take extra arch used to bring this up. She denies cough or shortness of breath or any further GI symptoms at this time. She also denies any problems passing her water.       Patient Active Problem List   Diagnosis Date Noted  . Gastroparesis due to DM 01/16/2015  . Gastric polyps 10/11/2014  . MGUS (monoclonal gammopathy of unknown significance) 10/10/2014  . Proteinuria 09/23/2014  . Gastroparesis due to secondary diabetes 08/05/2014  . Vitamin D insufficiency 03/29/2013  . Chronic kidney disease 09/14/2011  . CVA (cerebral infarction), past history 09/14/2011  . 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 DIABETES MELLITUS, TYPE II 08/12/2008  . HYPERLIPIDEMIA 08/12/2008  . HYPERTENSION 08/12/2008  . GERD 08/12/2008  . OSTEOARTHRITIS, lumbar spine 08/12/2008  . Osteoporosis, postmenopausal 08/12/2008  . KNEE REPLACEMENT, RIGHT, HX OF 08/12/2008   Outpatient Encounter Prescriptions as of 03/20/2015  Medication Sig  . acetaminophen  (TYLENOL) 500 MG tablet Take 1,000 mg by mouth every 6 (six) hours as needed for moderate pain.   Marland Kitchen amLODipine (NORVASC) 10 MG tablet TAKE 1 TABLET ONCE A DAY  . aspirin EC 81 MG tablet Take 81 mg by mouth daily.  . B Complex-C-Folic Acid (MULTIVITAMIN, STRESS FORMULA) tablet Take 1 tablet by mouth daily.    . bifidobacterium infantis (ALIGN) capsule Take 1 capsule by mouth daily.  Marland Kitchen guaiFENesin (MUCINEX) 600 MG 12 hr tablet Take 1,200 mg by mouth 2 (two) times daily as needed for cough or to loosen phlegm.   Marland Kitchen HUMALOG MIX 50/50 KWIKPEN (50-50) 100 UNIT/ML Kwikpen INJECT 58 UNITS SQ EVERY MORNING AND 38 UNITS EVERY EVENING  . insulin lispro (HUMALOG) 100 UNIT/ML injection Inject 5-12 Units into the skin daily. Sliding scale  . Insulin Lispro Prot & Lispro (HUMALOG MIX 50/50 KWIKPEN) (50-50) 100 UNIT/ML Kwikpen Inject 70 units qam with breakfast and 45 units qpm with supper  . Insulin Pen Needle (PEN NEEDLES 31GX5/16") 31G X 8 MM MISC Use to give insulin bid  . Lancets (ONETOUCH ULTRASOFT) lancets CHECK BLOOD SUGAR 4 TIMES A DAY  . levothyroxine (SYNTHROID, LEVOTHROID) 50 MCG tablet TAKE 1 TABLET DAILY  . metoCLOPramide (REGLAN) 5 MG tablet Take 1 tablet (5 mg total) by mouth 2 (two) times daily.  . metoprolol tartrate (LOPRESSOR) 25 MG tablet Take 1.5 tablets (37.5 mg total) by mouth 2 (two) times daily.  . mupirocin ointment (BACTROBAN) 2 % APPLY TO AFFECTED AREA AS DIRECTED  . omega-3 acid ethyl  esters (LOVAZA) 1 G capsule TAKE (2) CAPSULES TWICE DAILY.  Marland Kitchen ondansetron (ZOFRAN) 8 MG tablet Take 1/2 to 1 tab by mouth 3 times daily as needed. Take 1 dose early AM before breakfast. (Patient taking differently: Take 4-8 mg by mouth every 8 (eight) hours as needed for nausea. Take 1/2 to 1 tab by mouth 3 times daily as needed. Take 1 dose early AM before breakfast.)  . ONE TOUCH ULTRA TEST test strip Check BS TID and [id number]  . pantoprazole (PROTONIX) 40 MG tablet TAKE  (1)  TABLET TWICE A DAY.    Vladimir Faster Glycol-Propyl Glycol 0.4-0.3 % SOLN Apply 1 drop to eye 3 (three) times daily as needed (dry eyes).  . traMADol (ULTRAM) 50 MG tablet Take 1 tablet (50 mg total) by mouth every 6 (six) hours as needed. (Patient taking differently: Take 50 mg by mouth every 6 (six) hours as needed for moderate pain. )   No facility-administered encounter medications on file as of 03/20/2015.      Review of Systems  Constitutional: Positive for fatigue.  HENT: Negative.   Eyes: Negative.   Respiratory: Negative.  Negative for chest tightness.   Cardiovascular: Negative.   Gastrointestinal: Positive for nausea.  Endocrine: Positive for cold intolerance.  Genitourinary: Negative.   Musculoskeletal: Positive for myalgias.  Skin: Negative.   Allergic/Immunologic: Negative.   Neurological: Positive for weakness.  Hematological: Negative.   Psychiatric/Behavioral: Positive for sleep disturbance (more sleep than usual).       Objective:   Physical Exam  Constitutional: She is oriented to person, place, and time. No distress.  Somewhat more elderly than her age. Her mind was good today and her memory was fairly good with recounting how she is been feeling.  HENT:  Head: Normocephalic and atraumatic.  Right Ear: External ear normal.  Left Ear: External ear normal.  Nose: Nose normal.  Mouth/Throat: Oropharynx is clear and moist.  Eyes: Conjunctivae and EOM are normal. Pupils are equal, round, and reactive to light. Right eye exhibits no discharge. Left eye exhibits no discharge. No scleral icterus.  Neck: Normal range of motion. Neck supple. No thyromegaly present.  Cardiovascular: Normal rate, regular rhythm and normal heart sounds.   No murmur heard. At 72/m  Pulmonary/Chest: Effort normal and breath sounds normal. No respiratory distress. She has no wheezes. She has no rales. She exhibits no tenderness.  The chest was clear anteriorly and posteriorly  Abdominal: Soft. Bowel sounds are  normal. She exhibits no mass. There is no tenderness. There is no rebound and no guarding.  No abdominal or suprapubic tenderness  Musculoskeletal: She exhibits no edema.  Lymphadenopathy:    She has no cervical adenopathy.  Neurological: She is alert and oriented to person, place, and time.  Skin: Skin is warm and dry. No rash noted.  Psychiatric: She has a normal mood and affect. Her behavior is normal. Judgment and thought content normal.  Nursing note and vitals reviewed.  BP 157/68 mmHg  Pulse 66  Temp(Src) 97.4 F (36.3 C) (Oral)  Ht '5\' 1"'  (1.549 m)  Wt 193 lb (87.544 kg)  BMI 36.49 kg/m2  Results for orders placed or performed in visit on 03/20/15  POCT UA - Microscopic Only  Result Value Ref Range   WBC, Ur, HPF, POC 5-7    RBC, urine, microscopic 1-5    Bacteria, U Microscopic neg    Mucus, UA moderate    Epithelial cells, urine per micros rare  Crystals, Ur, HPF, POC neg    Casts, Ur, LPF, POC neg    Yeast, UA neg   POCT urinalysis dipstick  Result Value Ref Range   Color, UA yellow    Clarity, UA cloudy    Glucose, UA neg    Bilirubin, UA neg    Ketones, UA neg    Spec Grav, UA 1.025    Blood, UA trace    pH, UA 5.0    Protein, UA 4+    Urobilinogen, UA negative    Nitrite, UA neg    Leukocytes, UA Trace   POCT CBC  Result Value Ref Range   WBC 9.5 4.6 - 10.2 K/uL   Lymph, poc 2.2 0.6 - 3.4   POC LYMPH PERCENT 22.9 10 - 50 %L   POC Granulocyte 6.5 2 - 6.9   Granulocyte percent 68.3 37 - 80 %G   RBC 4.00 (A) 4.04 - 5.48 M/uL   Hemoglobin 11.8 (A) 12.2 - 16.2 g/dL   HCT, POC 37.5 (A) 37.7 - 47.9 %   MCV 93.7 80 - 97 fL   MCH, POC 29.5 27 - 31.2 pg   MCHC 31.5 (A) 31.8 - 35.4 g/dL   RDW, POC 14.3 %   Platelet Count, POC 275 142 - 424 K/uL   MPV 8.1 0 - 99.8 fL         Assessment & Plan:  1. Body aches -Continue Tylenol as needed for aches and pains and continue to drink plenty of fluids - POCT UA - Microscopic Only - POCT urinalysis  dipstick - POCT CBC - BMP8+EGFR - Thyroid Panel With TSH - Hepatic function panel  2. Weakness -Continue getting rest and increasing fluid intake - POCT UA - Microscopic Only - POCT urinalysis dipstick - POCT CBC - BMP8+EGFR - Thyroid Panel With TSH - Hepatic function panel  3. Chronic kidney disease (CKD), stage IV (severe) -Continue follow-up with nephrologist  4. Elevated white blood cell count -Lab work was reviewed before patient left the office and her white blood cell count was back down to a normal range  5. Viral syndrome -All of the patient's symptoms sorafenib a viral syndrome coupled with her chronic kidney disease and subsequent weakness from this.  Patient Instructions  The patient will continue to try to drink plenty of fluids and get as much rest as possible over the next few days to give her body a chance to recover from what appears to be a viral type syndrome this weekend. She has chronic kidney disease and she must continued to follow-up with the nephrologist and with the hematologist as planned. We will ask her to come back in 2-3 weeks and get another BMP. The CBC that was repeated today showed improvement in the white blood cell count and confirms that you had some kind of viral type syndrome this weekend because the elevated white blood cell count and the weakness and nausea and vomiting The urine specimen without symptoms is within normal limits today also.   Arrie Senate MD

## 2015-03-20 NOTE — Addendum Note (Signed)
Addended by: Zannie Cove on: 03/20/2015 04:42 PM   Modules accepted: Orders

## 2015-03-21 ENCOUNTER — Other Ambulatory Visit: Payer: Self-pay

## 2015-03-21 LAB — BMP8+EGFR
BUN/Creatinine Ratio: 9 — ABNORMAL LOW (ref 11–26)
BUN: 21 mg/dL (ref 8–27)
CHLORIDE: 103 mmol/L (ref 97–108)
CO2: 19 mmol/L (ref 18–29)
Calcium: 9.1 mg/dL (ref 8.7–10.3)
Creatinine, Ser: 2.24 mg/dL — ABNORMAL HIGH (ref 0.57–1.00)
GFR calc Af Amer: 24 mL/min/{1.73_m2} — ABNORMAL LOW (ref >59)
GFR calc non Af Amer: 21 mL/min/{1.73_m2} — ABNORMAL LOW (ref >59)
GLUCOSE: 162 mg/dL — AB (ref 65–99)
Potassium: 5.2 mmol/L (ref 3.5–5.2)
Sodium: 140 mmol/L (ref 134–144)

## 2015-03-21 LAB — HEPATIC FUNCTION PANEL
ALT: 58 IU/L — AB (ref 0–32)
AST: 88 IU/L — AB (ref 0–40)
Albumin: 3.5 g/dL (ref 3.5–4.8)
Alkaline Phosphatase: 274 IU/L — ABNORMAL HIGH (ref 39–117)
Bilirubin, Direct: 0.1 mg/dL (ref 0.00–0.40)
TOTAL PROTEIN: 7.1 g/dL (ref 6.0–8.5)

## 2015-03-21 LAB — THYROID PANEL WITH TSH
FREE THYROXINE INDEX: 1.6 (ref 1.2–4.9)
T3 Uptake Ratio: 28 % (ref 24–39)
T4, Total: 5.8 ug/dL (ref 4.5–12.0)
TSH: 3.32 u[IU]/mL (ref 0.450–4.500)

## 2015-03-24 ENCOUNTER — Other Ambulatory Visit: Payer: Self-pay

## 2015-03-24 DIAGNOSIS — R945 Abnormal results of liver function studies: Principal | ICD-10-CM

## 2015-03-24 DIAGNOSIS — R7989 Other specified abnormal findings of blood chemistry: Secondary | ICD-10-CM

## 2015-03-26 ENCOUNTER — Other Ambulatory Visit: Payer: Self-pay | Admitting: Family Medicine

## 2015-03-27 NOTE — Telephone Encounter (Signed)
Last seen 03/20/15 DW[facility]   This med not on EPIC list  If approved route to nurse to call into [facility]

## 2015-03-31 ENCOUNTER — Encounter: Payer: Self-pay | Admitting: Family Medicine

## 2015-04-01 ENCOUNTER — Other Ambulatory Visit: Payer: Self-pay | Admitting: Family Medicine

## 2015-04-03 ENCOUNTER — Ambulatory Visit (HOSPITAL_COMMUNITY)
Admission: RE | Admit: 2015-04-03 | Discharge: 2015-04-03 | Disposition: A | Discharge status: Home / Self Care | Payer: Medicare Other | Source: Ambulatory Visit | Attending: Internal Medicine | Admitting: Internal Medicine

## 2015-04-03 DIAGNOSIS — R7989 Other specified abnormal findings of blood chemistry: Secondary | ICD-10-CM | POA: Diagnosis present

## 2015-04-03 DIAGNOSIS — R945 Abnormal results of liver function studies: Secondary | ICD-10-CM

## 2015-04-04 ENCOUNTER — Other Ambulatory Visit: Payer: 59

## 2015-04-04 ENCOUNTER — Ambulatory Visit: Payer: 59 | Admitting: Hematology and Oncology

## 2015-04-07 ENCOUNTER — Other Ambulatory Visit: Payer: Self-pay | Admitting: Family Medicine

## 2015-04-07 ENCOUNTER — Encounter: Payer: Self-pay | Admitting: Family Medicine

## 2015-04-08 ENCOUNTER — Other Ambulatory Visit: Payer: Self-pay | Admitting: *Deleted

## 2015-04-08 ENCOUNTER — Other Ambulatory Visit (INDEPENDENT_AMBULATORY_CARE_PROVIDER_SITE_OTHER): Payer: Medicare Other

## 2015-04-08 ENCOUNTER — Ambulatory Visit (INDEPENDENT_AMBULATORY_CARE_PROVIDER_SITE_OTHER): Payer: Medicare Other

## 2015-04-08 ENCOUNTER — Other Ambulatory Visit: Payer: Self-pay | Admitting: Family Medicine

## 2015-04-08 ENCOUNTER — Other Ambulatory Visit: Payer: Medicare Other

## 2015-04-08 DIAGNOSIS — B349 Viral infection, unspecified: Secondary | ICD-10-CM

## 2015-04-08 DIAGNOSIS — R52 Pain, unspecified: Secondary | ICD-10-CM

## 2015-04-08 DIAGNOSIS — N184 Chronic kidney disease, stage 4 (severe): Secondary | ICD-10-CM | POA: Diagnosis not present

## 2015-04-08 DIAGNOSIS — M79644 Pain in right finger(s): Secondary | ICD-10-CM

## 2015-04-08 NOTE — Progress Notes (Signed)
Lab only 

## 2015-04-09 LAB — BMP8+EGFR
BUN/Creatinine Ratio: 8 — ABNORMAL LOW (ref 11–26)
BUN: 22 mg/dL (ref 8–27)
CHLORIDE: 105 mmol/L (ref 97–108)
CO2: 18 mmol/L (ref 18–29)
CREATININE: 2.78 mg/dL — AB (ref 0.57–1.00)
Calcium: 9 mg/dL (ref 8.7–10.3)
GFR calc Af Amer: 18 mL/min/{1.73_m2} — ABNORMAL LOW (ref >59)
GFR calc non Af Amer: 16 mL/min/{1.73_m2} — ABNORMAL LOW (ref >59)
Glucose: 182 mg/dL — ABNORMAL HIGH (ref 65–99)
Potassium: 5 mmol/L (ref 3.5–5.2)
SODIUM: 139 mmol/L (ref 134–144)

## 2015-04-10 ENCOUNTER — Telehealth: Payer: Self-pay | Admitting: *Deleted

## 2015-04-10 NOTE — Telephone Encounter (Signed)
Aware of wife's lab results. Labs forwarded to nephrologist.

## 2015-04-11 ENCOUNTER — Ambulatory Visit: Payer: 59 | Admitting: Hematology and Oncology

## 2015-04-17 ENCOUNTER — Other Ambulatory Visit (INDEPENDENT_AMBULATORY_CARE_PROVIDER_SITE_OTHER): Payer: Medicare Other

## 2015-04-17 ENCOUNTER — Encounter: Payer: Self-pay | Admitting: Internal Medicine

## 2015-04-17 ENCOUNTER — Telehealth: Payer: Self-pay | Admitting: *Deleted

## 2015-04-17 ENCOUNTER — Ambulatory Visit (INDEPENDENT_AMBULATORY_CARE_PROVIDER_SITE_OTHER): Payer: Medicare Other | Admitting: Internal Medicine

## 2015-04-17 VITALS — BP 146/58 | HR 56 | Ht 60.5 in | Wt 188.5 lb

## 2015-04-17 DIAGNOSIS — Z5181 Encounter for therapeutic drug level monitoring: Secondary | ICD-10-CM

## 2015-04-17 DIAGNOSIS — R110 Nausea: Secondary | ICD-10-CM | POA: Diagnosis not present

## 2015-04-17 DIAGNOSIS — K219 Gastro-esophageal reflux disease without esophagitis: Secondary | ICD-10-CM

## 2015-04-17 DIAGNOSIS — K3184 Gastroparesis: Secondary | ICD-10-CM

## 2015-04-17 DIAGNOSIS — R748 Abnormal levels of other serum enzymes: Secondary | ICD-10-CM

## 2015-04-17 DIAGNOSIS — R5383 Other fatigue: Secondary | ICD-10-CM

## 2015-04-17 DIAGNOSIS — G4730 Sleep apnea, unspecified: Secondary | ICD-10-CM

## 2015-04-17 DIAGNOSIS — K760 Fatty (change of) liver, not elsewhere classified: Secondary | ICD-10-CM

## 2015-04-17 DIAGNOSIS — Z79899 Other long term (current) drug therapy: Secondary | ICD-10-CM

## 2015-04-17 DIAGNOSIS — K317 Polyp of stomach and duodenum: Secondary | ICD-10-CM

## 2015-04-17 LAB — FERRITIN: Ferritin: 65.7 ng/mL (ref 10.0–291.0)

## 2015-04-17 LAB — PROTIME-INR
INR: 1 ratio (ref 0.8–1.0)
PROTHROMBIN TIME: 11 s (ref 9.6–13.1)

## 2015-04-17 LAB — HEPATIC FUNCTION PANEL
ALBUMIN: 3.7 g/dL (ref 3.5–5.2)
ALT: 73 U/L — ABNORMAL HIGH (ref 0–35)
AST: 94 U/L — ABNORMAL HIGH (ref 0–37)
Alkaline Phosphatase: 288 U/L — ABNORMAL HIGH (ref 39–117)
Bilirubin, Direct: 0.1 mg/dL (ref 0.0–0.3)
Total Bilirubin: 0.4 mg/dL (ref 0.2–1.2)
Total Protein: 7.7 g/dL (ref 6.0–8.3)

## 2015-04-17 LAB — IBC PANEL
Iron: 156 ug/dL — ABNORMAL HIGH (ref 42–145)
SATURATION RATIOS: 41.7 % (ref 20.0–50.0)
TRANSFERRIN: 267 mg/dL (ref 212.0–360.0)

## 2015-04-17 LAB — VITAMIN B12: Vitamin B-12: 506 pg/mL (ref 211–911)

## 2015-04-17 LAB — TSH: TSH: 5 u[IU]/mL — ABNORMAL HIGH (ref 0.35–4.50)

## 2015-04-17 MED ORDER — METOCLOPRAMIDE HCL 5 MG PO TABS: ORAL_TABLET | ORAL | Status: DC

## 2015-04-17 MED ORDER — ONDANSETRON 8 MG PO TBDP: 4.0000 mg | ORAL_TABLET | Freq: Three times a day (TID) | ORAL | Status: DC | PRN

## 2015-04-17 NOTE — Addendum Note (Signed)
Addended by: Gerlean Ren on: 04/17/2015 01:49 PM   Modules accepted: Orders

## 2015-04-17 NOTE — Progress Notes (Signed)
Subjective:    Patient ID: [name], female    DOB: 12/13/1935, 79 y.o.   MRN: [id number]  HPI [name] is a 79 year old female with a very complex past medical history including but not limited to GERD, fundic gland and hyperplastic gastric polyps, gastroparesis, diverticulitis with resection of most of her colon with ileostomy in place, fatty liver, elevated liver enzymes, MGUS, chronic renal insufficiency, diabetes, hypertension and hyperlipidemia who is here for follow-up. She's here today with her husband. Nausea continues to be the predominant problem for her on a daily basis. It starts in the morning and can persist throughout the day. She is not vomiting but feels like she needs to. She reports a good appetite and is able to eat solid food 3 meals a day. She reports bowel movements are liquid as always and brown without blood or melena through her ostomy. She denies abdominal pain. She was in the ER on 03/09/2015 with acute nausea and vomiting felt to be secondary to viral gastroenteritis. She got IV fluids and antinausea medicines. She is no longer taking the Reglan and she is unsure why, she wonders if she was having headaches. She is using Zofran 8 mg tablets one half tablet each morning usually once in the afternoon and occasionally at bedtime. She is taking pantoprazole 40 mg twice daily.  Occasionally when her back hurts it causes nausea but overall she denies pain today and still has nausea. She does feel occasional shortness of breath with any activity. She reports extreme fatigue. Her husband notes that she spends her entire day in a recliner and gets up to go to the bathroom. He serves her meals in the recliner. She wishes she had more energy   Review of Systems Per history of present illness, otherwise negative  Current Medications, Allergies, Past Medical History, Past Surgical History, Family History and Social History were reviewed in [employer] record.     Objective:   Physical Exam BP 146/58 mmHg  Pulse 56  Ht 5' 0.5" (1.537 m)  Wt 188 lb 8 oz (85.503 kg)  BMI 36.19 kg/m2 Constitutional: Well-developed and well-nourished. No distress. HEENT: Normocephalic and atraumatic. Oropharynx is clear and moist. No oropharyngeal exudate. Conjunctivae are normal.  No scleral icterus. Neck: Neck supple. Trachea midline. Cardiovascular: Normal rate, regular rhythm and intact distal pulses.  Pulmonary/chest: Effort normal and breath sounds normal. No wheezing, rales or rhonchi. Abdominal: Soft, obese, nontender, nondistended. Bowel sounds active throughout. Ostomy left lower quadrant draining liquid brown stool with parastomal hernia nontender, multiple well-healed abdominal scars Extremities: no clubbing, cyanosis, or edema Neurological: Alert and oriented to person place and time. Skin: Skin is warm and dry. No rashes noted. Psychiatric: Normal mood and affect. Behavior is normal.  Results for [name], [name] (MRN [id number]) as of 04/17/2015 12:54  Ref. Range 09/24/2014 12:56 01/16/2015 09:37 02/07/2015 11:06 02/27/2015 13:48 02/27/2015 13:49 02/27/2015 13:50 03/09/2015 02:24 03/09/2015 02:59 03/20/2015 15:10  Alkaline Phosphatase Latest Ref Range: 39-117 IU/L 202 (H) 258 (H)    248 (H)   274 (H)  Albumin Latest Ref Range: 3.5-4.8 g/dL 3.3 (L) 3.3 (L)    2.8 (L)   3.5  AST Latest Ref Range: 0-40 IU/L 101 (H) 58 (H)    88 (H)   88 (H)  ALT Latest Ref Range: 0-32 IU/L 68 (H) 44 (H)    55   58 (H)  Total Protein Latest Ref Range: 6.4-8.3 g/dL 7.3  7.2     Bilirubin, Direct Latest Ref Range: 0.0-0.3 mg/dL 0.0           US ABDOMEN LIMITED - RIGHT UPPER QUADRANT   COMPARISON:  None   FINDINGS: Gallbladder:   Normally distended without stones or wall thickening.   No pericholecystic fluid or sonographic Murphy sign.   Common bile duct:   Diameter: Normal caliber 2 mm diameter   Liver:   Coarsened probably slightly increased  echogenicity raising question of fatty infiltration. No definite mass or nodularity. Hepatopetal portal venous flow.   No RIGHT upper quadrant ascites.   IMPRESSION: Question fatty infiltration of liver.   Otherwise negative exam.     Electronically Signed   By: Lavonia Dana M.D.   On: 04/03/2015 09:41  HIDA - normal Jan 2015     Assessment & Plan:  79 year old female with a very complex past medical history including but not limited to GERD, fundic gland and hyperplastic gastric polyps, gastroparesis, diverticulitis with resection of most of her colon with ileostomy in place, fatty liver, elevated liver enzymes, MGUS, chronic renal insufficiency, diabetes, hypertension and hyperlipidemia who is here for follow-up.  1. Nausea/gastroparesis/GERD/history of gastric polyps -- nausea is felt to be multifactorial. She previously had a really nice response from a nausea perspective to Reglan. It is unclear why this was stopped. She is willing to re-try it and document any troublesome symptoms. I would like her to take 5 mg in the morning, midday and at bedtime. Dose can be increased if necessary. We have discussed the long-term side effects which she understands but is willing to accept the risk if it helps with nausea. Will change Zofran to oral disintegrating variety 8 mg 1/2-1 tab 3 times a day when necessary. I'm decreasing her pantoprazole to 40 mg once daily because she's not having heartburn. Gastric polyps not felt to cause nausea. Previously biopsied and benign without dysplasia. Hyperplastic polyps rarely have precancerous potential which we have previously discussed.  2. Elevated liver enzymes -- persistent, mild elevation in AST/ALT. Repeat ultrasound recently shows fatty liver but was otherwise normal. HIDA in 2015 negative. This is most likely related to fatty liver disease, nonalcoholic. She is able to lose meaningful weight, which is unlikely this will likely persist. I recommended  adding vitamin E 800 international units daily. It is possible that low-grade liver inflammation may be contributing to nausea, though this is felt less likely. There is no evidence for cirrhosis or decompensated liver disease. Repeat liver enzymes and INR today  3. Fatigue/malaise -- likely deconditioning. Her husband feels she may have sleep apnea. Referral for sleep apnea evaluation to be placed.  Also check B12, TSH and iron studies  Follow-up in 3-4 months, sooner if necessary 45 minutes spent with the patient and husband today

## 2015-04-17 NOTE — Patient Instructions (Addendum)
Your physician has requested that you go to the basement for lab work before leaving today  We have sent the following medications to your pharmacy for you to pick up at your convenience: Zofran and Reglan  Please purchase the following medications over the counter and take as directed: Vit E 800 units daily  Sleep study Referral: [facility] Dr. Elsworth Soho [place]  [phone] Aug. 24 at 2:30   Please call our office or message Dr. Hilarie Fredrickson if not feeling any better  Please follow up in 4 months

## 2015-04-17 NOTE — Telephone Encounter (Signed)
Spoke to patient regarding referral to Pulmonary for sleep study. Appt made for Aug 24 at 2:30 with Dr. Elsworth Soho for sleep study consult. Pt made aware of appt detail and verbalized understanding. Pt to call back with any question or concerns. [facility]  [address] [phone] Dr. Elsworth Soho (Aug 24 at 2:30)

## 2015-04-21 ENCOUNTER — Other Ambulatory Visit: Payer: Self-pay | Admitting: Family Medicine

## 2015-04-24 ENCOUNTER — Other Ambulatory Visit: Payer: Self-pay | Admitting: Family Medicine

## 2015-04-29 ENCOUNTER — Other Ambulatory Visit: Payer: Self-pay | Admitting: Family Medicine

## 2015-04-30 ENCOUNTER — Ambulatory Visit: Payer: Medicare Other | Admitting: Internal Medicine

## 2015-05-21 ENCOUNTER — Encounter: Payer: Self-pay | Admitting: Cardiology

## 2015-05-21 ENCOUNTER — Ambulatory Visit (INDEPENDENT_AMBULATORY_CARE_PROVIDER_SITE_OTHER): Payer: Medicare Other | Admitting: Cardiology

## 2015-05-21 VITALS — BP 120/60 | HR 50 | Ht 61.0 in | Wt 189.0 lb

## 2015-05-21 DIAGNOSIS — I10 Essential (primary) hypertension: Secondary | ICD-10-CM | POA: Diagnosis not present

## 2015-05-21 DIAGNOSIS — R072 Precordial pain: Secondary | ICD-10-CM

## 2015-05-21 NOTE — Patient Instructions (Signed)
Medication Instructions:  Your physician recommends that you continue on your current medications as directed. Please refer to the Current Medication list given to you today.  Follow-Up: As needed.  Thank you for choosing [facility]!!

## 2015-05-21 NOTE — Progress Notes (Signed)
Cardiology Office Note   Date:  05/21/2015   ID:  [name], DOB 1936-05-09, MRN [id number]  PCP:  Redge Gainer, MD  Cardiologist:   Minus Breeding, MD   Chief Complaint  Patient presents with  . Chest Pain      History of Present Illness: [name] is a 79 y.o. female who presents for evaluation of tiredness and excessive somnolence. She also had chest pain and I did review and ER record in May when she was there with this. She said she was nauseated but the ER doctor mentions mostly chest discomfort that he felt was somewhat atypical. She is limited in activities by back pain. She also has had extensive abdominal surgeries and ileostomy. She's not had any cardiac history however. I don't see any previous echocardiograms or stress test. She doesn't have any acute cardiovascular symptoms. The patient denies any new symptoms such as chest discomfort, neck or arm discomfort. There has been no new shortness of breath, PND or orthopnea. There have been no reported palpitations, presyncope or syncope.  She only gets around slowly and with a walker.  Past Medical History  Diagnosis Date  . Diverticulitis 2005    Colonoscopy--Dr. Sharlett Iles   . Diabetes mellitus without complication   . Hyperlipidemia   . Hypertension   . Stroke     Past history  . Hepatic steatosis   . Anemia   . Arthritis   . Chronic kidney disease (CKD)     past dialysis  . Status post dilation of esophageal narrowing   . MRSA infection 07/2009  . Proteinuria 09/23/2014  . MGUS (monoclonal gammopathy of unknown significance) 10/10/2014  . Hyperplastic colon polyp   . Fundic gland polyps of stomach, benign   . GERD (gastroesophageal reflux disease)   . Gastroparesis   . CVA (cerebral vascular accident)     Past Surgical History  Procedure Laterality Date  . Bilateral oophorectomy    . Rotator cuff repair Bilateral   . Carpal tunnel repair Bilateral   . Laparoscopic colostomy      for  diverticulitis  . Nasal sinus surgery    . Tonsillectomy    . Replacement total knee Right   . Abdominal hernia repair      with ilestomy  . Partial colectomy       Current Outpatient Prescriptions  Medication Sig Dispense Refill  . acetaminophen (TYLENOL) 500 MG tablet Take 1,000 mg by mouth every 6 (six) hours as needed for moderate pain.     Marland Kitchen amLODipine (NORVASC) 10 MG tablet TAKE 1 TABLET ONCE A DAY 90 tablet 1  . aspirin EC 81 MG tablet Take 81 mg by mouth daily.    . B Complex-C-Folic Acid (MULTIVITAMIN, STRESS FORMULA) tablet Take 1 tablet by mouth daily.      . bifidobacterium infantis (ALIGN) capsule Take 1 capsule by mouth daily. 14 capsule 0  . diazepam (VALIUM) 2 MG tablet TAKE 1 TABLET 1 HOUR BEFORE DENTAL APPOINTMENT 7 tablet 0  . guaiFENesin (MUCINEX) 600 MG 12 hr tablet Take 1,200 mg by mouth 2 (two) times daily as needed for cough or to loosen phlegm.     Marland Kitchen HUMALOG KWIKPEN 100 UNIT/ML KiwkPen INJECT UP TO 8 UNITS BEFORE LUNCH AS DIRECTED BASED ON BLOOD GLUCOSE 15 mL 2  . insulin lispro (HUMALOG) 100 UNIT/ML injection Inject 5-12 Units into the skin daily. Sliding scale    . insulin lispro protamine-lispro (HUMALOG MIX 50/50) (50-50) 100  UNIT/ML SUSP injection Inject into the skin. Take 70 units every morning with breakfast and 45 units every evening with supper    . levothyroxine (SYNTHROID, LEVOTHROID) 50 MCG tablet TAKE 1 TABLET DAILY 30 tablet 11  . metoCLOPramide (REGLAN) 5 MG tablet Take 1 tab by mouth in the morning, mid day and in the evening. 90 tablet 3  . metoprolol tartrate (LOPRESSOR) 25 MG tablet TAKE 1 & 1/2 TABLETS TWICE A DAY 90 tablet 2  . mupirocin ointment (BACTROBAN) 2 % APPLY TO AFFECTED AREA AS DIRECTED 22 g PRN  . omega-3 acid ethyl esters (LOVAZA) 1 G capsule TAKE (2) CAPSULES TWICE DAILY. 120 capsule 5  . ondansetron (ZOFRAN ODT) 8 MG disintegrating tablet Take 0.5-1 tablets (4-8 mg total) by mouth 3 (three) times daily as needed for nausea or  vomiting. 90 tablet 0  . pantoprazole (PROTONIX) 40 MG tablet TAKE (1) TABLET TWICE A DAY. 60 tablet 2  . Polyethyl Glycol-Propyl Glycol 0.4-0.3 % SOLN Apply 1 drop to eye 3 (three) times daily as needed (dry eyes).    . traMADol (ULTRAM) 50 MG tablet Take 1 tablet (50 mg total) by mouth every 6 (six) hours as needed. (Patient taking differently: Take 50 mg by mouth every 6 (six) hours as needed for moderate pain. ) 60 tablet 0  . Insulin Pen Needle (PEN NEEDLES 31GX5/16") 31G X 8 MM MISC USE AS DIRECTED TWICE A DAY 100 each 0  . Lancets (ONETOUCH ULTRASOFT) lancets CHECK BLOOD SUGAR 4 TIMES A DAY 100 each 1  . ONE TOUCH ULTRA TEST test strip Check BS TID and PRN.DX.250.0 100 each 11   No current facility-administered medications for this visit.    Allergies:   Ace inhibitors; Actos; Aspirin; Codeine; Hydromorphone hcl; Metformin and related; Penicillins; Procaine hcl; Statins; and Sulfa antibiotics    Social History:  The patient  reports that she has never smoked. She has never used smokeless tobacco. She reports that she does not drink alcohol or use illicit drugs.   Family History:  The patient's family history includes Diabetes in her mother and sister; Heart disease (age of onset: 31) in her father. There is no history of Colon cancer.    ROS:  Please see the history of present illness.   Otherwise, review of systems are positive for none.   All other systems are reviewed and negative.    PHYSICAL EXAM: VS:  BP 120/60 mmHg  Pulse 50  Ht 5\' 1"  (1.549 m)  Wt 189 lb (85.73 kg)  BMI 35.73 kg/m2 , BMI Body mass index is 35.73 kg/(m^2). GENERAL:  Well appearing HEENT:  Pupils equal round and reactive, fundi not visualized, oral mucosa unremarkable NECK:  No jugular venous distention, waveform within normal limits, carotid upstroke brisk and symmetric, no bruits, no thyromegaly LYMPHATICS:  No cervical, inguinal adenopathy LUNGS:  Clear to auscultation bilaterally BACK:  No CVA  tenderness CHEST:  Unremarkable HEART:  PMI not displaced or sustained,S1 and S2 within normal limits, no S3, no S4, no clicks, no rubs, no murmurs ABD:  Flat, positive bowel sounds normal in frequency in pitch, no bruits, no rebound, no guarding, no midline pulsatile mass, no hepatomegaly, no splenomegaly, , multiple surgical scars with ileostomy on the left EXT:  2 plus pulses throughout, no edema, no cyanosis no clubbing SKIN:  No rashes no nodules NEURO:  Cranial nerves II through XII grossly intact, motor grossly intact throughout PSYCH:  Cognitively intact, oriented to person place and time  EKG:  EKG is not ordered today. The ekg ordered 5/8 demonstrates Sinus rhythm, rate 77, axis within normal limits, intervals within normal limits, no acute ST-T wave changes.   Recent Labs: 03/09/2015: B Natriuretic Peptide 257.8*; Platelets 212 03/20/2015: Hemoglobin 11.8* 04/08/2015: BUN 22; Creatinine, Ser 2.78*; Potassium 5.0; Sodium 139 04/17/2015: ALT 73*; TSH 5.00*    Lipid Panel Lab Results  Component Value Date   CHOL 303* 01/16/2015   TRIG 324* 01/16/2015   HDL 60 01/16/2015   [facility] Comment 05/02/2014     Wt Readings from Last 3 Encounters:  05/21/15 189 lb (85.73 kg)  04/17/15 188 lb 8 oz (85.503 kg)  03/20/15 193 lb (87.544 kg)      Other studies Reviewed: Additional studies/ records that were reviewed today include: ED records. Review of the above records demonstrates:  Please see elsewhere in the note.     ASSESSMENT AND PLAN:  FATIGUE:  I suspect that this is multifactorial. I did review recent labs in her thyroid and CBC were normal. I would not see a cardiac etiology to this. Despite the fact that she has very poorly controlled diabetes there is no other indication that would suggest she needs screening for obstructive coronary disease or structural heart disease.  DM:  Her A1c is as above. She understands the importance of continuing to work with Dr. Laurance Flatten to  try to achieve better control of this.  CHEST PAIN:  I reviewed emergency room records. This was an atypical pain no objective evidence of ischemia. She's not describing this now. No further workup is suggested.  HYPERSOMNOLENCE:  The patient does have an evaluation with pulmonary to consider possible sleep apnea.   Current medicines are reviewed at length with the patient today.  The patient does not have concerns regarding medicines.  The following changes have been made:  no change  Labs/ tests ordered today include: None  No orders of the defined types were placed in this encounter.     Disposition:   FU with me as needed.      Signed, Minus Breeding, MD  05/21/2015 10:56 AM    [facility]

## 2015-05-27 ENCOUNTER — Ambulatory Visit (INDEPENDENT_AMBULATORY_CARE_PROVIDER_SITE_OTHER): Payer: Medicare Other | Admitting: Family Medicine

## 2015-05-27 ENCOUNTER — Other Ambulatory Visit: Payer: Self-pay | Admitting: Family Medicine

## 2015-05-27 ENCOUNTER — Encounter: Payer: Self-pay | Admitting: Family Medicine

## 2015-05-27 VITALS — BP 178/68 | HR 51 | Temp 97.4°F | Ht 61.0 in | Wt 187.0 lb

## 2015-05-27 DIAGNOSIS — E118 Type 2 diabetes mellitus with unspecified complications: Secondary | ICD-10-CM

## 2015-05-27 DIAGNOSIS — N189 Chronic kidney disease, unspecified: Secondary | ICD-10-CM

## 2015-05-27 DIAGNOSIS — R112 Nausea with vomiting, unspecified: Secondary | ICD-10-CM

## 2015-05-27 DIAGNOSIS — R1012 Left upper quadrant pain: Secondary | ICD-10-CM | POA: Diagnosis not present

## 2015-05-27 DIAGNOSIS — Z933 Colostomy status: Secondary | ICD-10-CM

## 2015-05-27 MED ORDER — GABAPENTIN 100 MG PO CAPS: 100.0000 mg | ORAL_CAPSULE | Freq: Two times a day (BID) | ORAL | Status: DC

## 2015-05-27 MED ORDER — "PEN NEEDLES 5/16"" 31G X 8 MM MISC": Status: DC

## 2015-05-27 NOTE — Addendum Note (Signed)
Addended by: Zannie Cove on: 05/27/2015 11:41 AM   Modules accepted: Orders

## 2015-05-27 NOTE — Patient Instructions (Signed)
We will try gabapentin 5200 mg twice daily at lunch and supper. If the gabapentin makes her to sleepy this should be discontinued. The patient was made aware of this as well as her husband. For the persistent nausea we will have the patient and her husband come back and meet with the clinical pharmacists and bring all other medications as well as their list of medications to review and how they are taking the medication. This potentially could help resolve some of the nausea. We will not know until we reviewed the medications with her.

## 2015-05-27 NOTE — Progress Notes (Signed)
Subjective:    Patient ID: [name], female    DOB: 1936-04-22, 79 y.o.   MRN: [id number]  HPI  Patient here today with complaints of lower left abdominal pain, fatigue, and nausea. Patient comes to the visit today with her husband. It is important to note that she also has a history of chronic kidney disease and diabetes. The patient has had this persistent nausea feeling and has seen the gastroenterologist for a workup of this. She does take Zofran. The pain that she is having is worse above the colostomy site when she is sitting and better when she is laying. She does not have a rash nor has she ever had the shingles in this area.      Patient Active Problem List   Diagnosis Date Noted  . Precordial chest pain 05/21/2015  . Gastroparesis due to DM 01/16/2015  . Gastric polyps 10/11/2014  . MGUS (monoclonal gammopathy of unknown significance) 10/10/2014  . Proteinuria 09/23/2014  . Vitamin D insufficiency 03/29/2013  . Chronic kidney disease 09/14/2011  . CVA (cerebral infarction), past history 09/14/2011  . 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 DIABETES MELLITUS, TYPE II 08/12/2008  . HYPERLIPIDEMIA 08/12/2008  . Essential hypertension 08/12/2008  . GERD 08/12/2008  . OSTEOARTHRITIS, lumbar spine 08/12/2008  . Osteoporosis, postmenopausal 08/12/2008  . KNEE REPLACEMENT, RIGHT, HX OF 08/12/2008   Outpatient Encounter Prescriptions as of 05/27/2015  Medication Sig  . acetaminophen (TYLENOL) 500 MG tablet Take 1,000 mg by mouth every 6 (six) hours as needed for moderate pain.   Marland Kitchen amLODipine (NORVASC) 10 MG tablet TAKE 1 TABLET ONCE A DAY  . aspirin EC 81 MG tablet Take 81 mg by mouth daily.  . B Complex-C-Folic Acid (MULTIVITAMIN, STRESS FORMULA) tablet Take 1 tablet by mouth daily.    . bifidobacterium infantis (ALIGN) capsule Take 1 capsule by mouth daily.  . diazepam (VALIUM) 2 MG tablet TAKE 1 TABLET 1 HOUR BEFORE DENTAL APPOINTMENT  . guaiFENesin  (MUCINEX) 600 MG 12 hr tablet Take 1,200 mg by mouth 2 (two) times daily as needed for cough or to loosen phlegm.   Marland Kitchen HUMALOG KWIKPEN 100 UNIT/ML KiwkPen INJECT UP TO 8 UNITS BEFORE LUNCH AS DIRECTED BASED ON BLOOD GLUCOSE  . insulin lispro (HUMALOG) 100 UNIT/ML injection Inject 5-12 Units into the skin daily. Sliding scale  . insulin lispro protamine-lispro (HUMALOG MIX 50/50) (50-50) 100 UNIT/ML SUSP injection Inject into the skin. Take 70 units every morning with breakfast and 45 units every evening with supper  . Insulin Pen Needle (PEN NEEDLES 31GX5/16") 31G X 8 MM MISC USE AS DIRECTED TWICE A DAY  . Lancets (ONETOUCH ULTRASOFT) lancets CHECK BLOOD SUGAR 4 TIMES A DAY  . levothyroxine (SYNTHROID, LEVOTHROID) 50 MCG tablet TAKE 1 TABLET DAILY  . metoCLOPramide (REGLAN) 5 MG tablet Take 1 tab by mouth in the morning, mid day and in the evening.  . metoprolol tartrate (LOPRESSOR) 25 MG tablet TAKE 1 & 1/2 TABLETS TWICE A DAY  . mupirocin ointment (BACTROBAN) 2 % APPLY TO AFFECTED AREA AS DIRECTED  . omega-3 acid ethyl esters (LOVAZA) 1 G capsule TAKE (2) CAPSULES TWICE DAILY.  Marland Kitchen ondansetron (ZOFRAN ODT) 8 MG disintegrating tablet Take 0.5-1 tablets (4-8 mg total) by mouth 3 (three) times daily as needed for nausea or vomiting.  . ONE TOUCH ULTRA TEST test strip Check BS TID and PRN.DX.250.0  . pantoprazole (PROTONIX) 40 MG tablet TAKE (1) TABLET TWICE A DAY.  Vladimir Faster Glycol-Propyl  Glycol 0.4-0.3 % SOLN Apply 1 drop to eye 3 (three) times daily as needed (dry eyes).  . traMADol (ULTRAM) 50 MG tablet Take 1 tablet (50 mg total) by mouth every 6 (six) hours as needed. (Patient taking differently: Take 50 mg by mouth every 6 (six) hours as needed for moderate pain. )   No facility-administered encounter medications on file as of 05/27/2015.        Review of Systems  Constitutional: Positive for fatigue.  HENT: Negative.   Eyes: Negative.   Respiratory: Negative.   Cardiovascular:  Negative.   Gastrointestinal: Positive for nausea and abdominal pain (lower left).  Endocrine: Negative.   Genitourinary: Negative.   Musculoskeletal: Negative.   Skin: Negative.   Allergic/Immunologic: Negative.   Neurological: Negative.   Hematological: Negative.   Psychiatric/Behavioral: Negative.        Objective:   Physical Exam  Constitutional: She is oriented to person, place, and time. She appears well-developed and well-nourished. No distress.  HENT:  Head: Normocephalic.  Eyes: Conjunctivae and EOM are normal. Pupils are equal, round, and reactive to light. Right eye exhibits no discharge. Left eye exhibits no discharge. No scleral icterus.  Neck: Normal range of motion.  Abdominal: Soft. Bowel sounds are normal. She exhibits no distension and no mass. There is tenderness. There is no rebound and no guarding.  Musculoskeletal: Normal range of motion.  Neurological: She is alert and oriented to person, place, and time.  Skin: Skin is warm and dry. No rash noted.  Psychiatric: Her behavior is normal. Judgment and thought content normal.  Somewhat depressed mood due to her medical circumstances  Vitals reviewed.   BP 178/68 mmHg  Pulse 51  Temp(Src) 97.4 F (36.3 C)  Ht 5\' 1"  (1.549 m)  Wt 187 lb (84.823 kg)  BMI 35.35 kg/m2         Assessment & Plan:  1. Abdominal pain, left upper quadrant -Continue follow-up with gastroenterology -Try gabapentin 5200 mg once or twice daily to see if this helps any  2. Nausea and vomiting, vomiting of unspecified type -Appointment with clinical pharmacy to review medication list and how she is taking the medication and with what she is taking the medication  3. CRI (chronic renal insufficiency), unspecified stage -Continue follow-up with nephrology  4. Colostomy in place -Continue follow-up with gastroenterology  5.  diabetes mellitus2 with chronic kidney disease stage III -The patient should continue to watch her diet  and exercise as regularly as possible  Take gabapentin 50-100 mg at lunch and supper and patient and her husband were instructed that if this makes her too drowsy to discontinue it. This is to help control some of the pain she is having in her abdomen.  Patient Instructions  We will try gabapentin 5200 mg twice daily at lunch and supper. If the gabapentin makes her to sleepy this should be discontinued. The patient was made aware of this as well as her husband. For the persistent nausea we will have the patient and her husband come back and meet with the clinical pharmacists and bring all other medications as well as their list of medications to review and how they are taking the medication. This potentially could help resolve some of the nausea. We will not know until we reviewed the medications with her.   Arrie Senate MD

## 2015-06-10 ENCOUNTER — Encounter: Payer: Self-pay | Admitting: Family Medicine

## 2015-06-10 ENCOUNTER — Ambulatory Visit (INDEPENDENT_AMBULATORY_CARE_PROVIDER_SITE_OTHER): Payer: Medicare Other | Admitting: Family Medicine

## 2015-06-10 VITALS — BP 172/63 | HR 68 | Temp 97.2°F | Ht 61.0 in | Wt 189.0 lb

## 2015-06-10 DIAGNOSIS — I10 Essential (primary) hypertension: Secondary | ICD-10-CM

## 2015-06-10 DIAGNOSIS — R110 Nausea: Secondary | ICD-10-CM | POA: Diagnosis not present

## 2015-06-10 DIAGNOSIS — E118 Type 2 diabetes mellitus with unspecified complications: Secondary | ICD-10-CM | POA: Diagnosis not present

## 2015-06-10 DIAGNOSIS — E785 Hyperlipidemia, unspecified: Secondary | ICD-10-CM | POA: Diagnosis not present

## 2015-06-10 DIAGNOSIS — N184 Chronic kidney disease, stage 4 (severe): Secondary | ICD-10-CM | POA: Diagnosis not present

## 2015-06-10 DIAGNOSIS — E039 Hypothyroidism, unspecified: Secondary | ICD-10-CM

## 2015-06-10 DIAGNOSIS — R1012 Left upper quadrant pain: Secondary | ICD-10-CM | POA: Diagnosis not present

## 2015-06-10 DIAGNOSIS — R7989 Other specified abnormal findings of blood chemistry: Secondary | ICD-10-CM | POA: Diagnosis not present

## 2015-06-10 LAB — POCT GLYCOSYLATED HEMOGLOBIN (HGB A1C): Hemoglobin A1C: 7.3

## 2015-06-10 NOTE — Progress Notes (Signed)
Subjective:    Patient ID: [name], female    DOB: 01-20-1936, 79 y.o.   MRN: [id number]  HPI Pt here for follow up and management of chronic medical problems which includes hypertension, diabetes, and hyperlipidemia. She is taking medications regularly. The patient continues to have ongoing nausea. She has seen the gastroenterologist for this. She continues to take Zofran and pantoprazole. She also continues to have some left-sided abdominal pain. She will get lab work today. We will do her routine diabetic foot check today also. The patient says her nausea is worse in the morning. She is currently taking her proton next after supper every night. She says she takes her medicines with a large glass of water. She was supposed to have a visit with the clinical pharmacist to review how she is taking her medications and working with that to see if there's something we can do to reduce the nausea that she is experiencing. She denies chest pain shortness of breath blood in the stool or changing in the colostomy movements. She is having no trouble passing her water.     Patient Active Problem List   Diagnosis Date Noted  . Precordial chest pain 05/21/2015  . Gastroparesis due to DM 01/16/2015  . Gastric polyps 10/11/2014  . MGUS (monoclonal gammopathy of unknown significance) 10/10/2014  . Proteinuria 09/23/2014  . Vitamin D insufficiency 03/29/2013  . Chronic kidney disease 09/14/2011  . CVA (cerebral infarction), past history 09/14/2011  . 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 DIABETES MELLITUS, TYPE II 08/12/2008  . HYPERLIPIDEMIA 08/12/2008  . Essential hypertension 08/12/2008  . GERD 08/12/2008  . OSTEOARTHRITIS, lumbar spine 08/12/2008  . Osteoporosis, postmenopausal 08/12/2008  . KNEE REPLACEMENT, RIGHT, HX OF 08/12/2008   Outpatient Encounter Prescriptions as of 06/10/2015  Medication Sig  . acetaminophen (TYLENOL) 500 MG tablet Take 1,000 mg by mouth every 6  (six) hours as needed for moderate pain.   Marland Kitchen amLODipine (NORVASC) 10 MG tablet TAKE 1 TABLET ONCE A DAY  . aspirin EC 81 MG tablet Take 81 mg by mouth daily.  . B Complex-C-Folic Acid (MULTIVITAMIN, STRESS FORMULA) tablet Take 1 tablet by mouth daily.    . bifidobacterium infantis (ALIGN) capsule Take 1 capsule by mouth daily.  . diazepam (VALIUM) 2 MG tablet TAKE 1 TABLET 1 HOUR BEFORE DENTAL APPOINTMENT  . gabapentin (NEURONTIN) 100 MG capsule Take 1 capsule (100 mg total) by mouth 2 (two) times daily.  Marland Kitchen guaiFENesin (MUCINEX) 600 MG 12 hr tablet Take 1,200 mg by mouth 2 (two) times daily as needed for cough or to loosen phlegm.   Marland Kitchen HUMALOG KWIKPEN 100 UNIT/ML KiwkPen INJECT UP TO 8 UNITS BEFORE LUNCH AS DIRECTED BASED ON BLOOD GLUCOSE  . insulin lispro (HUMALOG) 100 UNIT/ML injection Inject 5-12 Units into the skin daily. Sliding scale  . insulin lispro protamine-lispro (HUMALOG MIX 50/50) (50-50) 100 UNIT/ML SUSP injection Inject into the skin. Take 70 units every morning with breakfast and 45 units every evening with supper  . Insulin Pen Needle (PEN NEEDLES 31GX5/16") 31G X 8 MM MISC USE AS DIRECTED TWICE A DAY  . Lancets (ONETOUCH ULTRASOFT) lancets CHECK BLOOD SUGAR 4 TIMES A DAY  . levothyroxine (SYNTHROID, LEVOTHROID) 50 MCG tablet TAKE 1 TABLET DAILY  . metoCLOPramide (REGLAN) 5 MG tablet Take 1 tab by mouth in the morning, mid day and in the evening.  . metoprolol tartrate (LOPRESSOR) 25 MG tablet TAKE 1 & 1/2 TABLETS TWICE A DAY  .  mupirocin ointment (BACTROBAN) 2 % APPLY TO AFFECTED AREA AS DIRECTED  . omega-3 acid ethyl esters (LOVAZA) 1 G capsule TAKE (2) CAPSULES TWICE DAILY.  Marland Kitchen ondansetron (ZOFRAN ODT) 8 MG disintegrating tablet Take 0.5-1 tablets (4-8 mg total) by mouth 3 (three) times daily as needed for nausea or vomiting.  . ONE TOUCH ULTRA TEST test strip Check BS TID and PRN.DX.250.0  . pantoprazole (PROTONIX) 40 MG tablet TAKE (1) TABLET TWICE A DAY.  Vladimir Faster  Glycol-Propyl Glycol 0.4-0.3 % SOLN Apply 1 drop to eye 3 (three) times daily as needed (dry eyes).  . traMADol (ULTRAM) 50 MG tablet Take 1 tablet (50 mg total) by mouth every 6 (six) hours as needed. (Patient taking differently: Take 50 mg by mouth every 6 (six) hours as needed for moderate pain. )   No facility-administered encounter medications on file as of 06/10/2015.     Review of Systems  Constitutional: Negative.   HENT: Negative.   Eyes: Negative.   Respiratory: Negative.   Cardiovascular: Negative.   Gastrointestinal: Positive for nausea. Negative for vomiting and abdominal pain.  Endocrine: Negative.   Genitourinary: Negative.   Musculoskeletal: Negative.   Skin: Negative.   Allergic/Immunologic: Negative.   Neurological: Negative.   Hematological: Negative.   Psychiatric/Behavioral: Negative.        Objective:   Physical Exam  Constitutional: She is oriented to person, place, and time. She appears well-developed and well-nourished. She appears distressed.  The patient is pleasant and alert but feeling very nauseated this morning even to the point that she was almost not going to come to the doctor's office  HENT:  Head: Normocephalic and atraumatic.  Right Ear: External ear normal.  Left Ear: External ear normal.  Nose: Nose normal.  Mouth/Throat: Oropharynx is clear and moist. No oropharyngeal exudate.  Eyes: Conjunctivae and EOM are normal. Pupils are equal, round, and reactive to light. Right eye exhibits no discharge. Left eye exhibits no discharge. No scleral icterus.  Neck: Normal range of motion. Neck supple. No thyromegaly present.  Cardiovascular: Normal rate, regular rhythm, normal heart sounds and intact distal pulses.  Exam reveals no gallop and no friction rub.   No murmur heard. At 60/m  Pulmonary/Chest: Effort normal and breath sounds normal. No respiratory distress. She has no wheezes. She has no rales. She exhibits no tenderness.  Abdominal: Soft.  Bowel sounds are normal. She exhibits no mass. There is tenderness. There is no rebound and no guarding.  Colostomy site appears good and there was tenderness in the left upper quadrant above the colostomy site.  Musculoskeletal: She exhibits no edema or tenderness.  The patient uses a walker for ambulation  Lymphadenopathy:    She has no cervical adenopathy.  Neurological: She is alert and oriented to person, place, and time. She has normal reflexes. No cranial nerve deficit.  Skin: Skin is warm and dry. No rash noted.  Psychiatric: She has a normal mood and affect. Her behavior is normal. Judgment and thought content normal.  Nursing note and vitals reviewed.  BP 172/63 mmHg  Pulse 68  Temp(Src) 97.2 F (36.2 C) (Oral)  Ht '5\' 1"'  (1.549 m)  Wt 189 lb (85.73 kg)  BMI 35.73 kg/m2        Assessment & Plan:  1.  diabetes mellitus2 with chronic kidney disease stage III -Lab work is pending any change in medication will be undertaken once that is returned - POCT glycosylated hemoglobin (Hb A1C) - CBC with Differential/Platelet  2. Chronic kidney disease (CKD), stage IV (severe) -There is no edema and no apparent changes and kidney function based on the exam today. Lab work is pending - BMP8+EGFR - CBC with Differential/Platelet  3. Essential hypertension, benign -The systolic blood pressure is elevated today but the patient is very nauseated the diastolic is good and we will make no changes with her medication today - BMP8+EGFR - Hepatic function panel - CBC with Differential/Platelet  4. Hypothyroidism, unspecified hypothyroidism type -Continue current treatment pending results of lab work - Thyroid Panel With TSH - CBC with Differential/Platelet  5. Low serum vitamin D -The patient is or has renal insufficiency and no changes will be made with vitamin D replacement - Vit D  25 hydroxy (rtn osteoporosis monitoring) - CBC with Differential/Platelet  6.  Hyperlipidemia -The patient is statin intolerant we'll have to continue with her current opiate lifestyle change - Lipid panel - CBC with Differential/Platelet  7. Nausea -This is been going on on for several weeks and we have not been able to control this. She has seen the gastroenterologist. He thinks that she has gastroparesis because of her diabetes.  8. Abdominal pain, left upper quadrant -The patient is slightly tender in the left upper quadrant and this is been the case in the past and no changes will be made with her treatment  Patient Instructions                       Medicare Annual Wellness Visit  [facility] and the medical providers at [facility] strive to bring you the best medical care.  In doing so we not only want to address your current medical conditions and concerns but also to detect new conditions early and prevent illness, disease and health-related problems.    Medicare offers a yearly Wellness Visit which allows our clinical staff to assess your need for preventative services including immunizations, lifestyle education, counseling to decrease risk of preventable diseases and screening for fall risk and other medical concerns.    This visit is provided free of charge (no copay) for all Medicare recipients. The clinical pharmacists at [facility] have begun to conduct these Wellness Visits which will also include a thorough review of all your medications.    As you primary medical provider recommend that you make an appointment for your Annual Wellness Visit if you have not done so already this year.  You may set up this appointment before you leave today or you may call back ([phone]) and schedule an appointment.  Please make sure when you call that you mention that you are scheduling your Annual Wellness Visit with the clinical pharmacist so that the appointment may be made for the proper length of time.     Continue  current medications. Continue good therapeutic lifestyle changes which include good diet and exercise. Fall precautions discussed with patient. If an FOBT was given today- please return it to our front desk. If you are over 34 years old - you may need Prevnar 30 or the adult Pneumonia vaccine.   After your visit with [name] today you will receive a survey in the mail or online from [employer] regarding your care with [name]. Please take a moment to fill this out. Your feedback is very important to [name] as you can help [name] better understand your patient needs as well as improve your experience and satisfaction. WE CARE ABOUT YOU!!!   The patient  should change the time of the evening pro time next are pantoprazole to taking it at bedtime. She should also take a Zofran one half pill at bedtime. She should make sure that she drinks plenty of water to wash these pills down. She should stay as active as possible She should keep an appointment with the clinical pharmacist to review the way she is taking her medicines to make sure that this is not creating problems with nausea   Arrie Senate MD

## 2015-06-10 NOTE — Patient Instructions (Addendum)
Medicare Annual Wellness Visit  [facility] and the medical providers at [facility] strive to bring you the best medical care.  In doing so we not only want to address your current medical conditions and concerns but also to detect new conditions early and prevent illness, disease and health-related problems.    Medicare offers a yearly Wellness Visit which allows our clinical staff to assess your need for preventative services including immunizations, lifestyle education, counseling to decrease risk of preventable diseases and screening for fall risk and other medical concerns.    This visit is provided free of charge (no copay) for all Medicare recipients. The clinical pharmacists at [facility] have begun to conduct these Wellness Visits which will also include a thorough review of all your medications.    As you primary medical provider recommend that you make an appointment for your Annual Wellness Visit if you have not done so already this year.  You may set up this appointment before you leave today or you may call back ([phone]) and schedule an appointment.  Please make sure when you call that you mention that you are scheduling your Annual Wellness Visit with the clinical pharmacist so that the appointment may be made for the proper length of time.     Continue current medications. Continue good therapeutic lifestyle changes which include good diet and exercise. Fall precautions discussed with patient. If an FOBT was given today- please return it to our front desk. If you are over 67 years old - you may need Prevnar 80 or the adult Pneumonia vaccine.   After your visit with [name] today you will receive a survey in the mail or online from [employer] regarding your care with [name]. Please take a moment to fill this out. Your feedback is very important to [name] as you can help [name] better understand your patient needs as well as  improve your experience and satisfaction. WE CARE ABOUT YOU!!!   The patient should change the time of the evening pro time next are pantoprazole to taking it at bedtime. She should also take a Zofran one half pill at bedtime. She should make sure that she drinks plenty of water to wash these pills down. She should stay as active as possible She should keep an appointment with the clinical pharmacist to review the way she is taking her medicines to make sure that this is not creating problems with nausea

## 2015-06-11 LAB — CBC WITH DIFFERENTIAL/PLATELET
BASOS: 0 %
Basophils Absolute: 0 10*3/uL (ref 0.0–0.2)
EOS (ABSOLUTE): 0.2 10*3/uL (ref 0.0–0.4)
EOS: 3 %
HEMOGLOBIN: 11.6 g/dL (ref 11.1–15.9)
Hematocrit: 35 % (ref 34.0–46.6)
IMMATURE GRANS (ABS): 0 10*3/uL (ref 0.0–0.1)
IMMATURE GRANULOCYTES: 0 %
LYMPHS: 18 %
Lymphocytes Absolute: 1.3 10*3/uL (ref 0.7–3.1)
MCH: 30.9 pg (ref 26.6–33.0)
MCHC: 33.1 g/dL (ref 31.5–35.7)
MCV: 93 fL (ref 79–97)
Monocytes Absolute: 0.7 10*3/uL (ref 0.1–0.9)
Monocytes: 10 %
Neutrophils Absolute: 4.7 10*3/uL (ref 1.4–7.0)
Neutrophils: 69 %
Platelets: 222 10*3/uL (ref 150–379)
RBC: 3.75 x10E6/uL — AB (ref 3.77–5.28)
RDW: 15.2 % (ref 12.3–15.4)
WBC: 6.9 10*3/uL (ref 3.4–10.8)

## 2015-06-11 LAB — HEPATIC FUNCTION PANEL
ALT: 48 IU/L — AB (ref 0–32)
AST: 61 IU/L — AB (ref 0–40)
Albumin: 3.8 g/dL (ref 3.5–4.8)
Alkaline Phosphatase: 249 IU/L — ABNORMAL HIGH (ref 39–117)
Bilirubin Total: 0.3 mg/dL (ref 0.0–1.2)
Bilirubin, Direct: 0.1 mg/dL (ref 0.00–0.40)
Total Protein: 6.8 g/dL (ref 6.0–8.5)

## 2015-06-11 LAB — BMP8+EGFR
BUN/Creatinine Ratio: 10 — ABNORMAL LOW (ref 11–26)
BUN: 24 mg/dL (ref 8–27)
CO2: 16 mmol/L — AB (ref 18–29)
Calcium: 9.6 mg/dL (ref 8.7–10.3)
Chloride: 102 mmol/L (ref 97–108)
Creatinine, Ser: 2.52 mg/dL — ABNORMAL HIGH (ref 0.57–1.00)
GFR, EST AFRICAN AMERICAN: 21 mL/min/{1.73_m2} — AB (ref >59)
GFR, EST NON AFRICAN AMERICAN: 18 mL/min/{1.73_m2} — AB (ref >59)
Glucose: 280 mg/dL — ABNORMAL HIGH (ref 65–99)
Potassium: 5.4 mmol/L — ABNORMAL HIGH (ref 3.5–5.2)
SODIUM: 137 mmol/L (ref 134–144)

## 2015-06-11 LAB — VITAMIN D 25 HYDROXY (VIT D DEFICIENCY, FRACTURES): Vit D, 25-Hydroxy: 11.5 ng/mL — ABNORMAL LOW (ref 30.0–100.0)

## 2015-06-11 LAB — LIPID PANEL
Chol/HDL Ratio: 6.7 ratio units — ABNORMAL HIGH (ref 0.0–4.4)
Cholesterol, Total: 380 mg/dL — ABNORMAL HIGH (ref 100–199)
HDL: 57 mg/dL (ref >39)
LDL CALC: 246 mg/dL — AB (ref 0–99)
TRIGLYCERIDES: 384 mg/dL — AB (ref 0–149)
VLDL Cholesterol Cal: 77 mg/dL — ABNORMAL HIGH (ref 5–40)

## 2015-06-11 LAB — THYROID PANEL WITH TSH
FREE THYROXINE INDEX: 1.6 (ref 1.2–4.9)
T3 Uptake Ratio: 24 % (ref 24–39)
T4, Total: 6.7 ug/dL (ref 4.5–12.0)
TSH: 3.18 u[IU]/mL (ref 0.450–4.500)

## 2015-06-16 ENCOUNTER — Ambulatory Visit (INDEPENDENT_AMBULATORY_CARE_PROVIDER_SITE_OTHER): Payer: Medicare Other | Admitting: Pharmacist

## 2015-06-16 ENCOUNTER — Encounter: Payer: Self-pay | Admitting: Pharmacist

## 2015-06-16 VITALS — BP 160/68 | HR 60 | Ht 61.0 in | Wt 188.0 lb

## 2015-06-16 DIAGNOSIS — R110 Nausea: Secondary | ICD-10-CM

## 2015-06-16 DIAGNOSIS — K3184 Gastroparesis: Secondary | ICD-10-CM

## 2015-06-16 DIAGNOSIS — R5383 Other fatigue: Secondary | ICD-10-CM | POA: Diagnosis not present

## 2015-06-16 DIAGNOSIS — Z79899 Other long term (current) drug therapy: Secondary | ICD-10-CM

## 2015-06-16 MED ORDER — GLUCAGON (RDNA) 1 MG IJ KIT: PACK | INTRAMUSCULAR | Status: AC

## 2015-06-16 NOTE — Progress Notes (Signed)
Patient ID: [name], female   DOB: [birth year], 79 y.o.   MRN: [id number]    Subjective:   [name] is a 79 y.o. female who is referred by Dr Laurance Flatten for review of medications and to advise patient about timing of adminstration.  Patient was in to see Dr Laurance Flatten about 1 week ago for nausea.  She was experiencing nausea in the morning upon awaking and before eating.  It would last from 1 to several hours and eating did not seem to improve or worsen.   She was advised to take protonix 60m bid and to take 1/2 tablet of ondansetron 811mat bedtime.  She states that she feel that taking the ondansetron at bedtime made the nausea worse and she stopped after a few days.  She also reports that she is not taking gabapentin anymore because she was becoming very drowsy.  She denies any current neuropathy today.   Current Medications (verified) Outpatient Encounter Prescriptions as of 06/16/2015  Medication Sig  . acetaminophen (TYLENOL) 500 MG tablet Take 1,000 mg by mouth every 6 (six) hours as needed for moderate pain.   . Marland KitchenmLODipine (NORVASC) 10 MG tablet TAKE 1 TABLET ONCE A DAY  . aspirin EC 81 MG tablet Take 81 mg by mouth daily.  . B Complex-C-Folic Acid (MULTIVITAMIN, STRESS FORMULA) tablet Take 1 tablet by mouth daily.    . insulin lispro (HUMALOG KWIKPEN) 100 UNIT/ML KiwkPen Inject 5 to 12 units subcutaneously with  lunch based on blood glucose reading.  . insulin lispro protamine-lispro (HUMALOG MIX 50/50) (50-50) 100 UNIT/ML SUSP injection Inject into the skin. Take 70 units every morning with breakfast and 45 units every evening with supper  . Insulin Pen Needle (PEN NEEDLES 31GX5/16") 31G X 8 MM MISC USE AS DIRECTED TWICE A DAY  . Lancets (ONETOUCH ULTRASOFT) lancets CHECK BLOOD SUGAR 4 TIMES A DAY  . levothyroxine (SYNTHROID, LEVOTHROID) 50 MCG tablet TAKE 1 TABLET DAILY  . metoCLOPramide (REGLAN) 5 MG tablet Take 1 tab by mouth tid   . metoprolol tartrate (LOPRESSOR) 25 MG  tablet TAKE 1 & 1/2 TABLETS TWICE A DAY  . mupirocin ointment (BACTROBAN) 2 % APPLY TO AFFECTED AREA AS DIRECTED  . omega-3 acid ethyl esters (LOVAZA) 1 G capsule TAKE (2) CAPSULES TWICE DAILY.  . Marland Kitchenndansetron (ZOFRAN ODT) 8 MG disintegrating tablet Take 0.5-1 tablets (4-8 mg total) by mouth 3 (three) times daily as needed for nausea or vomiting.  . ONE TOUCH ULTRA TEST test strip Check BS TID and PRN.DX.[id number]  . pantoprazole (PROTONIX) 40 MG tablet TAKE (1) TABLET TWICE A DAY.  . Vladimir Fasterlycol-Propyl Glycol 0.4-0.3 % SOLN Apply 1 drop to eye 3 (three) times daily as needed (dry eyes).  . Ruthell RummageWIKPEN 100 UNIT/ML KiwkPen INJECT UP TO 8 UNITS BEFORE LUNCH AS DIRECTED BASED ON BLOOD GLUCOSE  .  metoCLOPramide (REGLAN) 5 MG tablet Take 1 tab by mouth in the morning, mid day and in the evening.  . diazepam (VALIUM) 2 MG tablet TAKE 1 TABLET 1 HOUR BEFORE DENTAL APPOINTMENT (Patient not taking: Reported on 06/16/2015)  . glucagon (GLUCAGON EMERGENCY) 1 MG injection Inject into the muscle as needed for severe low BG  . guaiFENesin (MUCINEX) 600 MG 12 hr tablet Take 1,200 mg by mouth 2 (two) times daily as needed for cough or to loosen phlegm.   . traMADol (ULTRAM) 50 MG tablet Take 1 tablet (50 mg total) by mouth every 6 (six) hours as needed. (Patient  not taking: Reported on 06/16/2015)  .  bifidobacterium infantis (ALIGN) capsule Take 1 capsule by mouth daily. (Patient not taking: Reported on 06/16/2015)  . gabapentin (NEURONTIN) 100 MG capsule Take 1 capsule (100 mg total) by mouth 2 (two) times daily. (Patient not taking: Reported on 06/16/2015)  .     No facility-administered encounter medications on file as of 06/16/2015.    Allergies (verified) Ace inhibitors; Actos; Aspirin; Codeine; Hydromorphone hcl; Metformin and related; Penicillins; Procaine hcl; Statins; and Sulfa antibiotics   History: Past Medical History  Diagnosis Date  . Diverticulitis 2005    Colonoscopy--Dr. Sharlett Iles   .  Diabetes mellitus without complication   . Hyperlipidemia   . Hypertension   . Stroke     Past history  . Hepatic steatosis   . Anemia   . Arthritis   . Chronic kidney disease (CKD)     past dialysis  . Status post dilation of esophageal narrowing   . MRSA infection 07/2009  . Proteinuria 09/23/2014  . MGUS (monoclonal gammopathy of unknown significance) 10/10/2014  . Hyperplastic colon polyp   . Fundic gland polyps of stomach, benign   . GERD (gastroesophageal reflux disease)   . Gastroparesis   . CVA (cerebral vascular accident)    Past Surgical History  Procedure Laterality Date  . Bilateral oophorectomy    . Rotator cuff repair Bilateral   . Carpal tunnel repair Bilateral   . Laparoscopic colostomy      for diverticulitis  . Nasal sinus surgery    . Tonsillectomy    . Replacement total knee Right   . Abdominal hernia repair      with ilestomy  . Partial colectomy     Family History  Problem Relation Age of Onset  . Diabetes Mother   . Heart disease Father 80    Died after bypass  . Diabetes Sister   . Colon cancer Neg Hx    Social History   Occupational History  . retired    Social History Main Topics  . Smoking status: Never Smoker   . Smokeless tobacco: Never Used  . Alcohol Use: No  . Drug Use: No  . Sexual Activity: Not on file      Objective:    Today's Vitals   06/16/15 1005  BP: 160/68  Pulse: 60  Height: 5' 1" (1.549 m)  Weight: 188 lb (85.276 kg)   Body mass index is 35.54 kg/(m^2).     Assessment:    Medication Review HTN nausea    Plan:   1.  Advised patient to administer insulin prior to meals instead of after meals.  2.  Also advised patient to administer metoclopramide prior to each meal instead of after meals - this might improve nausea 3.  Moved ASA 49m 1 tablet daily and amlodipine 178m1 tablet daily fto evening.  4.  Remind off gabapentin and also advised patient to stop Align as with her ileostomy it is unlike  that is of benefit. 5.  rx for glucogaon kit to be used as needed for unresponsive hypoglycemic event.  If need to use should also call 911. 6.  RTC in 1 month - recheck BP/medication / nausea  Below is medication list provided to patient  Medication List for MaMaebel MarascoDOB: [birth year]ast Update 06/16/2015 Medication Name Reason for Taking Prior to Breakfast Prior to Lunch Prior to Supper Bedtime  Humalog Mix 50/50 Diabetes 70 units  45 units   Humalog  Diabetes  5 to 12 units    Vitamin B Complex  1 tablet     Mucinex / guiafeniesin 612m Cough / mucus 1 tablet     Levothyroxine 546m Thyroid 1 tablet     Metoclopramide 94m6m Reglan Stomach 1 tablet 1 tablet 1 tablet   Metoprolol 294m63mLopressor Blood pressure / heart 1 and  tablets  1 and  tablets   Fish Oil / Lovaza Triglycerides 2 capsules  2 capsules   Pantoprazole / Protonix 40mg79mmach / reflux 1 tablet   1 tablet  Aspirin 81mg 50mke prevention    1 tablet  Amlodipine 10mg /19mvasc Blood pressure    1 tablet  Mupirocin ointment For infection Apply as  directed     As Needed Medications  Acetaminophen / Tylenol 500mg Pa33make 2 tablets up to every 8 hours as needed for pain  Ondansetron / Zofran 8mg Naus3mTake  to 1 tablet up to every 8 hours as needed for nausea  Tramadol / Ultram 50mg More31mere pain Take 1 tablet up to every 6 hours as needed  Glucagon Kit For severe low blood glucose Use as needed for low blood glucose if not responsive and call 911.    Murice Barbar, TaCherre Robins 8[facility]16

## 2015-06-19 ENCOUNTER — Other Ambulatory Visit: Payer: Self-pay | Admitting: Family Medicine

## 2015-06-25 ENCOUNTER — Institutional Professional Consult (permissible substitution): Payer: Medicare Other | Admitting: Pulmonary Disease

## 2015-06-27 ENCOUNTER — Other Ambulatory Visit: Payer: Self-pay | Admitting: Family Medicine

## 2015-06-30 NOTE — Telephone Encounter (Signed)
Last seen 06/10/15 DWM   If approved route to nurse to call into [facility]

## 2015-07-01 ENCOUNTER — Emergency Department (HOSPITAL_COMMUNITY): Payer: Medicare Other

## 2015-07-01 ENCOUNTER — Emergency Department (HOSPITAL_COMMUNITY)
Admission: EM | Admit: 2015-07-01 | Discharge: 2015-07-01 | Disposition: A | Discharge status: Home / Self Care | Payer: Medicare Other | Attending: Emergency Medicine | Admitting: Emergency Medicine

## 2015-07-01 ENCOUNTER — Telehealth: Payer: Self-pay | Admitting: Internal Medicine

## 2015-07-01 ENCOUNTER — Encounter (HOSPITAL_COMMUNITY): Payer: Self-pay | Admitting: Emergency Medicine

## 2015-07-01 DIAGNOSIS — Z8614 Personal history of Methicillin resistant Staphylococcus aureus infection: Secondary | ICD-10-CM | POA: Diagnosis not present

## 2015-07-01 DIAGNOSIS — Z794 Long term (current) use of insulin: Secondary | ICD-10-CM | POA: Diagnosis not present

## 2015-07-01 DIAGNOSIS — D649 Anemia, unspecified: Secondary | ICD-10-CM | POA: Diagnosis not present

## 2015-07-01 DIAGNOSIS — R1110 Vomiting, unspecified: Secondary | ICD-10-CM | POA: Diagnosis present

## 2015-07-01 DIAGNOSIS — E119 Type 2 diabetes mellitus without complications: Secondary | ICD-10-CM | POA: Insufficient documentation

## 2015-07-01 DIAGNOSIS — N189 Chronic kidney disease, unspecified: Secondary | ICD-10-CM | POA: Diagnosis not present

## 2015-07-01 DIAGNOSIS — Z7982 Long term (current) use of aspirin: Secondary | ICD-10-CM | POA: Diagnosis not present

## 2015-07-01 DIAGNOSIS — I129 Hypertensive chronic kidney disease with stage 1 through stage 4 chronic kidney disease, or unspecified chronic kidney disease: Secondary | ICD-10-CM | POA: Insufficient documentation

## 2015-07-01 DIAGNOSIS — R509 Fever, unspecified: Secondary | ICD-10-CM | POA: Diagnosis not present

## 2015-07-01 DIAGNOSIS — Z79899 Other long term (current) drug therapy: Secondary | ICD-10-CM | POA: Diagnosis not present

## 2015-07-01 DIAGNOSIS — Z8673 Personal history of transient ischemic attack (TIA), and cerebral infarction without residual deficits: Secondary | ICD-10-CM | POA: Insufficient documentation

## 2015-07-01 DIAGNOSIS — R05 Cough: Secondary | ICD-10-CM | POA: Insufficient documentation

## 2015-07-01 DIAGNOSIS — Z792 Long term (current) use of antibiotics: Secondary | ICD-10-CM | POA: Insufficient documentation

## 2015-07-01 DIAGNOSIS — M1990 Unspecified osteoarthritis, unspecified site: Secondary | ICD-10-CM | POA: Insufficient documentation

## 2015-07-01 DIAGNOSIS — Z880 Allergy status to penicillin: Secondary | ICD-10-CM | POA: Diagnosis not present

## 2015-07-01 DIAGNOSIS — R110 Nausea: Secondary | ICD-10-CM

## 2015-07-01 DIAGNOSIS — Z86010 Personal history of colonic polyps: Secondary | ICD-10-CM | POA: Insufficient documentation

## 2015-07-01 DIAGNOSIS — N390 Urinary tract infection, site not specified: Secondary | ICD-10-CM | POA: Diagnosis not present

## 2015-07-01 LAB — URINALYSIS, ROUTINE W REFLEX MICROSCOPIC
Bilirubin Urine: NEGATIVE
Glucose, UA: 100 mg/dL — AB
KETONES UR: NEGATIVE mg/dL
NITRITE: NEGATIVE
Protein, ur: >300 mg/dL — AB
SPECIFIC GRAVITY, URINE: 1.02 (ref 1.005–1.030)
Urobilinogen, UA: 0.2 mg/dL (ref 0.0–1.0)
pH: 5 (ref 5.0–8.0)

## 2015-07-01 LAB — COMPREHENSIVE METABOLIC PANEL
ALT: 36 U/L (ref 14–54)
AST: 51 U/L — AB (ref 15–41)
Albumin: 3.5 g/dL (ref 3.5–5.0)
Alkaline Phosphatase: 181 U/L — ABNORMAL HIGH (ref 38–126)
Anion gap: 11 (ref 5–15)
BUN: 23 mg/dL — ABNORMAL HIGH (ref 6–20)
CHLORIDE: 109 mmol/L (ref 101–111)
CO2: 16 mmol/L — ABNORMAL LOW (ref 22–32)
Calcium: 9.2 mg/dL (ref 8.9–10.3)
Creatinine, Ser: 2.87 mg/dL — ABNORMAL HIGH (ref 0.44–1.00)
GFR, EST AFRICAN AMERICAN: 17 mL/min — AB (ref >60)
GFR, EST NON AFRICAN AMERICAN: 15 mL/min — AB (ref >60)
Glucose, Bld: 194 mg/dL — ABNORMAL HIGH (ref 65–99)
POTASSIUM: 4.6 mmol/L (ref 3.5–5.1)
Sodium: 136 mmol/L (ref 135–145)
Total Bilirubin: 0.4 mg/dL (ref 0.3–1.2)
Total Protein: 7.4 g/dL (ref 6.5–8.1)

## 2015-07-01 LAB — CBC
HEMATOCRIT: 38 % (ref 36.0–46.0)
Hemoglobin: 12.5 g/dL (ref 12.0–15.0)
MCH: 30.7 pg (ref 26.0–34.0)
MCHC: 32.9 g/dL (ref 30.0–36.0)
MCV: 93.4 fL (ref 78.0–100.0)
Platelets: 235 10*3/uL (ref 150–400)
RBC: 4.07 MIL/uL (ref 3.87–5.11)
RDW: 14 % (ref 11.5–15.5)
WBC: 8 10*3/uL (ref 4.0–10.5)

## 2015-07-01 LAB — I-STAT TROPONIN, ED: Troponin i, poc: 0.05 ng/mL (ref 0.00–0.08)

## 2015-07-01 LAB — URINE MICROSCOPIC-ADD ON

## 2015-07-01 LAB — LIPASE, BLOOD

## 2015-07-01 MED ORDER — CEPHALEXIN 500 MG PO CAPS: 500.0000 mg | ORAL_CAPSULE | Freq: Two times a day (BID) | ORAL | Status: DC

## 2015-07-01 MED ORDER — DIPHENHYDRAMINE HCL 50 MG/ML IJ SOLN
12.5000 mg | Freq: Once | INTRAMUSCULAR | Status: AC
  Administered 2015-07-01: 12.5 mg via INTRAVENOUS
  Filled 2015-07-01: qty 1

## 2015-07-01 MED ORDER — CEPHALEXIN 500 MG PO CAPS
500.0000 mg | ORAL_CAPSULE | Freq: Once | ORAL | Status: AC
  Administered 2015-07-01: 500 mg via ORAL
  Filled 2015-07-01: qty 1

## 2015-07-01 MED ORDER — METOCLOPRAMIDE HCL 5 MG/ML IJ SOLN
5.0000 mg | Freq: Once | INTRAMUSCULAR | Status: AC
  Administered 2015-07-01: 5 mg via INTRAVENOUS
  Filled 2015-07-01: qty 2

## 2015-07-01 MED ORDER — SODIUM CHLORIDE 0.9 % IV BOLUS (SEPSIS)
1000.0000 mL | Freq: Once | INTRAVENOUS | Status: AC
  Administered 2015-07-01: 1000 mL via INTRAVENOUS

## 2015-07-01 NOTE — Telephone Encounter (Signed)
Ok, let's followup the ED visit and schedule followup as appropriate

## 2015-07-01 NOTE — ED Provider Notes (Signed)
CSN: [id number]     Arrival date & time 07/01/15  1545 History   First MD Initiated Contact with Patient 07/01/15 1749     Chief Complaint  Patient presents with  . Emesis     (Consider location/radiation/quality/duration/timing/severity/associated sxs/prior Treatment) HPI  79 year old female with a history of chronic nausea from gastroparesis presents with worsening nausea and more frequent nausea over the past 3 days. Called her gastroenterologist, Dr. Elmo Putt, recommended coming into the ER. Patient has not had vomiting but has had occasional spitting up. Has had pain around her ileostomy site on the left but has not had any decreased stool output. She has had pain in this area intermittently for "years". Patient states that the nausea is more frequent and stronger than typical. She has been taking her Zofran with temporary relief. Last took her Reglan yesterday. Denies any chest pain or shortness of breath. Had a temperature of 99 but denies any new symptoms of infection such as dysuria. She states she has a chronic cough that is not worse right now.  Past Medical History  Diagnosis Date  . Diverticulitis 2005    Colonoscopy--Dr. Sharlett Iles   . Diabetes mellitus without complication   . Hyperlipidemia   . Hypertension   . Stroke     Past history  . Hepatic steatosis   . Anemia   . Arthritis   . Chronic kidney disease (CKD)     past dialysis  . Status post dilation of esophageal narrowing   . MRSA infection 07/2009  . Proteinuria 09/23/2014  . MGUS (monoclonal gammopathy of unknown significance) 10/10/2014  . Hyperplastic colon polyp   . Fundic gland polyps of stomach, benign   . GERD (gastroesophageal reflux disease)   . Gastroparesis   . CVA (cerebral vascular accident)    Past Surgical History  Procedure Laterality Date  . Bilateral oophorectomy    . Rotator cuff repair Bilateral   . Carpal tunnel repair Bilateral   . Laparoscopic colostomy      for diverticulitis  .  Nasal sinus surgery    . Tonsillectomy    . Replacement total knee Right   . Abdominal hernia repair      with ilestomy  . Partial colectomy     Family History  Problem Relation Age of Onset  . Diabetes Mother   . Heart disease Father 57    Died after bypass  . Diabetes Sister   . Colon cancer Neg Hx    Social History  Substance Use Topics  . Smoking status: Never Smoker   . Smokeless tobacco: Never Used  . Alcohol Use: No   OB History    No data available     Review of Systems  Constitutional: Positive for fever (99 max).  Respiratory: Positive for cough. Negative for shortness of breath.   Cardiovascular: Negative for chest pain.  Gastrointestinal: Positive for nausea and abdominal pain. Negative for vomiting, diarrhea and constipation.  All other systems reviewed and are negative.     Allergies  Ace inhibitors; Actos; Aspirin; Codeine; Hydromorphone hcl; Metformin and related; Penicillins; Procaine hcl; Statins; and Sulfa antibiotics  Home Medications   Prior to Admission medications   Medication Sig Start Date End Date Taking? Authorizing Provider  acetaminophen (TYLENOL) 500 MG tablet Take 1,000 mg by mouth every 6 (six) hours as needed for moderate pain.    Yes Historical Provider, MD  amLODipine (NORVASC) 10 MG tablet TAKE 1 TABLET ONCE A DAY 01/20/15  Yes Estella Husk  Laurance Flatten, MD  aspirin EC 81 MG tablet Take 81 mg by mouth daily.   Yes Historical Provider, MD  B Complex-C-Folic Acid (MULTIVITAMIN, STRESS FORMULA) tablet Take 1 tablet by mouth daily.     Yes Historical Provider, MD  diazepam (VALIUM) 2 MG tablet TAKE 1 TABLET 1 HOUR BEFORE DENTAL APPOINTMENT 06/30/15  Yes Chipper Herb, MD  glucagon [facility] EMERGENCY) 1 MG injection Inject into the muscle as needed for severe low BG 06/16/15  Yes Tammy Eckard, PHARMD  guaiFENesin (MUCINEX) 600 MG 12 hr tablet Take 1,200 mg by mouth 2 (two) times daily as needed for cough or to loosen phlegm.    Yes Historical  Provider, MD  insulin lispro (HUMALOG KWIKPEN) 100 UNIT/ML KiwkPen Inject 5 to 12 units subcutaneously prior to lunch based on blood glucose reading. 06/16/15  Yes Tammy Eckard, PHARMD  insulin lispro protamine-lispro (HUMALOG MIX 50/50) (50-50) 100 UNIT/ML SUSP injection Inject 45-70 Units into the skin 2 (two) times daily before a meal. Take 70 units every morning with breakfast and 45 units every evening with supper   Yes Historical Provider, MD  Insulin Pen Needle (PEN NEEDLES 31GX5/16") 31G X 8 MM MISC USE AS DIRECTED TWICE A DAY 05/27/15  Yes Chipper Herb, MD  Lancets [facility] ULTRASOFT) lancets CHECK BLOOD SUGAR 4 TIMES A DAY 04/07/15  Yes Chipper Herb, MD  levothyroxine (SYNTHROID, LEVOTHROID) 50 MCG tablet TAKE 1 TABLET DAILY 09/19/14  Yes Chipper Herb, MD  metoCLOPramide (REGLAN) 5 MG tablet Take 1 tab by mouth tid prior to breakfast, lunch and dinner / supper 06/16/15  Yes Tammy Eckard, PHARMD  metoprolol tartrate (LOPRESSOR) 25 MG tablet TAKE 1 & 1/2 TABLETS TWICE A DAY 04/01/15  Yes Siddalee-Margaret Hassell Done, FNP  mupirocin ointment (BACTROBAN) 2 % APPLY TO AFFECTED AREA AS DIRECTED 10/08/14  Yes Chipper Herb, MD  omega-3 acid ethyl esters (LOVAZA) 1 G capsule TAKE (2) CAPSULES TWICE DAILY. 09/06/14  Yes Chipper Herb, MD  ondansetron (ZOFRAN ODT) 8 MG disintegrating tablet Take 0.5-1 tablets (4-8 mg total) by mouth 3 (three) times daily as needed for nausea or vomiting. 04/17/15  Yes Jerene Bears, MD  ONE TOUCH ULTRA TEST test strip Test blood sugar tid. DX E11.8 06/20/15  Yes Chipper Herb, MD  pantoprazole (PROTONIX) 40 MG tablet TAKE (1) TABLET TWICE A DAY. 04/29/15  Yes Chipper Herb, MD  Polyethyl Glycol-Propyl Glycol 0.4-0.3 % SOLN Apply 1 drop to eye 3 (three) times daily as needed (dry eyes).   Yes Historical Provider, MD  traMADol (ULTRAM) 50 MG tablet Take 1 tablet (50 mg total) by mouth every 6 (six) hours as needed. Patient not taking: Reported on 06/16/2015 11/06/13   Sable Feil, MD   BP 163/132 mmHg  Pulse 100  Temp(Src) 98.4 F (36.9 C) (Oral)  Resp 16  SpO2 99% Physical Exam  Constitutional: She is oriented to person, place, and time. She appears well-developed and well-nourished. No distress.  HENT:  Head: Normocephalic and atraumatic.  Right Ear: External ear normal.  Left Ear: External ear normal.  Nose: Nose normal.  Eyes: Right eye exhibits no discharge. Left eye exhibits no discharge.  Cardiovascular: Normal rate, regular rhythm and normal heart sounds.   Pulmonary/Chest: Effort normal and breath sounds normal.  Abdominal: Soft. There is tenderness.    Neurological: She is alert and oriented to person, place, and time.  Skin: Skin is warm and dry. She is not diaphoretic.  Nursing  note and vitals reviewed.   ED Course  Procedures (including critical care time) Labs Review Labs Reviewed  LIPASE, BLOOD - Abnormal; Notable for the following:    Lipase <10 (*)    All other components within normal limits  COMPREHENSIVE METABOLIC PANEL - Abnormal; Notable for the following:    CO2 16 (*)    Glucose, Bld 194 (*)    BUN 23 (*)    Creatinine, Ser 2.87 (*)    AST 51 (*)    Alkaline Phosphatase 181 (*)    GFR calc non Af Amer 15 (*)    GFR calc Af Amer 17 (*)    All other components within normal limits  URINALYSIS, ROUTINE W REFLEX MICROSCOPIC (NOT AT [facility]) - Abnormal; Notable for the following:    APPearance CLOUDY (*)    Glucose, UA 100 (*)    Hgb urine dipstick SMALL (*)    Protein, ur >300 (*)    Leukocytes, UA SMALL (*)    All other components within normal limits  URINE MICROSCOPIC-ADD ON - Abnormal; Notable for the following:    Bacteria, UA FEW (*)    Casts HYALINE CASTS (*)    All other components within normal limits  URINE CULTURE  CBC  I-STAT TROPOININ, ED    Imaging Review Dg Abd Acute W/chest  07/01/2015   CLINICAL DATA:  Patient with left-sided abdominal pain above colostomy bag. Persistent cough for  multiple weeks.  EXAM: DG ABDOMEN ACUTE W/ 1V CHEST  COMPARISON:  Chest radiograph 03/09/2015  FINDINGS: Monitoring leads overlie the patient. Stable cardiac and mediastinal contours. No consolidative pulmonary opacities. No pleural effusion or pneumothorax.  Gas is demonstrated within nondilated loops of large and small bowel within the abdomen. The entire left hemi abdomen is not included on this radiograph however there may potentially be a left parastomal hernia. Lumbar spine degenerative changes.  IMPRESSION: The entire left hemi abdomen is not included on current radiograph however there may be a left parastomal hernia. No evidence for small bowel obstruction.  No acute cardiopulmonary process.   Electronically Signed   By: Lovey Newcomer M.D.   On: 07/01/2015 18:48   I have personally reviewed and evaluated these images and lab results as part of my medical decision-making.   EKG Interpretation   Date/Time:  Tuesday July 01 2015 16:10:19 EDT Ventricular Rate:  95 PR Interval:  188 QRS Duration: 67 QT Interval:  338 QTC Calculation: 425 R Axis:   4 Text Interpretation:  Sinus rhythm Left atrial enlargement Left  ventricular hypertrophy Nonspecific T abnormalities, lateral leads  nonsepcific T wave changes new compared to May 2016 Confirmed by Regenia Skeeter   MD, McLean ([id number]) on 07/01/2015 6:20:44 PM      MDM   Final diagnoses:  Nausea in adult  UTI (lower urinary tract infection)    Patient's nausea is better with IV reglan. No vomiting. EKG with nonspecific T-wave changes but no cardiac symptoms. Negative troponin. Given 3 days of symptoms I do not feel repeat testing is needed. I have low suspicion this is ACS related and feel this is more likely related to her chronic gastroparesis. Given that she is having an increase in nausea however with 11-20 white blood cells in her urine I will treat her for possible UTI. No vomiting in the emergency department. Follow-up closely with her  gastroenterologist.  Sherwood Gambler, MD 07/02/15 0005

## 2015-07-01 NOTE — Telephone Encounter (Signed)
Pts husband states the pt is having more nausea and just not feeling well. States she has a fever and would like for her to be seen today. States pt temp is 99 and her normal is 97. Explained there were no appts available for today but that we could see the pt on Thursday. Pt states she cannot wait and states she will go to [facility] ER. Dr. Hilarie Fredrickson notified.

## 2015-07-01 NOTE — ED Notes (Signed)
Pt states that she has had N/V for a week or more. Hx of GI problems that she is being followed by her PCP on. Alert and oriented.

## 2015-07-02 ENCOUNTER — Telehealth: Payer: Self-pay | Admitting: *Deleted

## 2015-07-02 ENCOUNTER — Encounter: Payer: Self-pay | Admitting: Internal Medicine

## 2015-07-02 ENCOUNTER — Encounter: Payer: Self-pay | Admitting: Family Medicine

## 2015-07-02 MED ORDER — NITROFURANTOIN MACROCRYSTAL 100 MG PO CAPS: 100.0000 mg | ORAL_CAPSULE | Freq: Two times a day (BID) | ORAL | Status: DC

## 2015-07-02 NOTE — Telephone Encounter (Signed)
Pt has been scheduled to see Cecille Rubin Hvozdovic, PA-C 07/18/15@10 :15am.

## 2015-07-02 NOTE — Telephone Encounter (Signed)
[facility] at [facility] called and [name] was in ER last night for uti. They prescribed keflex, they have her allergic to this in the past, look over allergy list and find a better alternative

## 2015-07-02 NOTE — Telephone Encounter (Signed)
See previous note - what can we change it to? [facility] to address

## 2015-07-02 NOTE — Telephone Encounter (Signed)
Try Macrodantin 100 mg twice daily with food for 7 days-----check urinalysis when completed

## 2015-07-02 NOTE — Telephone Encounter (Signed)
Pharm called

## 2015-07-03 ENCOUNTER — Other Ambulatory Visit: Payer: Self-pay | Admitting: Physician Assistant

## 2015-07-03 LAB — URINE CULTURE

## 2015-07-04 ENCOUNTER — Telehealth: Payer: Self-pay | Admitting: Internal Medicine

## 2015-07-04 NOTE — Telephone Encounter (Signed)
Rx was already sent for Zofran yesterday. Patient advised. Pharmacy states rx is ready.

## 2015-07-05 ENCOUNTER — Encounter: Payer: Self-pay | Admitting: Internal Medicine

## 2015-07-11 ENCOUNTER — Telehealth: Payer: Self-pay | Admitting: Pharmacist

## 2015-07-11 ENCOUNTER — Other Ambulatory Visit: Payer: Self-pay

## 2015-07-11 MED ORDER — ONETOUCH ULTRA BLUE VI STRP: ORAL_STRIP | Status: DC

## 2015-07-11 NOTE — Telephone Encounter (Signed)
Patient with type 2 DM requiring multiple insulin injections per day.  She reports that she has been checking BG more often because she had has more N/V and as a results hypoglycemic events.  She is adjusting insulin accordingly.   Rx sent to [facility] with new directions to check BG up to qid prn.

## 2015-07-14 ENCOUNTER — Inpatient Hospital Stay (HOSPITAL_COMMUNITY): Payer: Medicare Other

## 2015-07-14 ENCOUNTER — Emergency Department (HOSPITAL_COMMUNITY): Payer: Medicare Other

## 2015-07-14 ENCOUNTER — Encounter: Payer: Self-pay | Admitting: Internal Medicine

## 2015-07-14 ENCOUNTER — Inpatient Hospital Stay (HOSPITAL_COMMUNITY)
Admission: EM | Admit: 2015-07-14 | Discharge: 2015-07-21 | DRG: 682 | Disposition: A | Discharge status: Skilled Nursing Facility | Payer: Medicare Other | Attending: Internal Medicine | Admitting: Internal Medicine

## 2015-07-14 ENCOUNTER — Telehealth: Payer: Self-pay | Admitting: Internal Medicine

## 2015-07-14 ENCOUNTER — Encounter (HOSPITAL_COMMUNITY): Payer: Self-pay

## 2015-07-14 DIAGNOSIS — G8929 Other chronic pain: Secondary | ICD-10-CM | POA: Diagnosis present

## 2015-07-14 DIAGNOSIS — I639 Cerebral infarction, unspecified: Secondary | ICD-10-CM | POA: Diagnosis present

## 2015-07-14 DIAGNOSIS — I63312 Cerebral infarction due to thrombosis of left middle cerebral artery: Secondary | ICD-10-CM

## 2015-07-14 DIAGNOSIS — E860 Dehydration: Secondary | ICD-10-CM | POA: Diagnosis present

## 2015-07-14 DIAGNOSIS — I159 Secondary hypertension, unspecified: Secondary | ICD-10-CM

## 2015-07-14 DIAGNOSIS — K2950 Unspecified chronic gastritis without bleeding: Secondary | ICD-10-CM | POA: Diagnosis present

## 2015-07-14 DIAGNOSIS — E785 Hyperlipidemia, unspecified: Secondary | ICD-10-CM | POA: Insufficient documentation

## 2015-07-14 DIAGNOSIS — R110 Nausea: Secondary | ICD-10-CM | POA: Diagnosis not present

## 2015-07-14 DIAGNOSIS — E440 Moderate protein-calorie malnutrition: Secondary | ICD-10-CM | POA: Insufficient documentation

## 2015-07-14 DIAGNOSIS — I10 Essential (primary) hypertension: Secondary | ICD-10-CM | POA: Diagnosis not present

## 2015-07-14 DIAGNOSIS — R1110 Vomiting, unspecified: Secondary | ICD-10-CM

## 2015-07-14 DIAGNOSIS — R112 Nausea with vomiting, unspecified: Secondary | ICD-10-CM | POA: Insufficient documentation

## 2015-07-14 DIAGNOSIS — Z882 Allergy status to sulfonamides status: Secondary | ICD-10-CM

## 2015-07-14 DIAGNOSIS — R109 Unspecified abdominal pain: Secondary | ICD-10-CM

## 2015-07-14 DIAGNOSIS — E11649 Type 2 diabetes mellitus with hypoglycemia without coma: Secondary | ICD-10-CM | POA: Diagnosis present

## 2015-07-14 DIAGNOSIS — K3184 Gastroparesis: Secondary | ICD-10-CM | POA: Diagnosis present

## 2015-07-14 DIAGNOSIS — T426X5A Adverse effect of other antiepileptic and sedative-hypnotic drugs, initial encounter: Secondary | ICD-10-CM | POA: Diagnosis not present

## 2015-07-14 DIAGNOSIS — Z833 Family history of diabetes mellitus: Secondary | ICD-10-CM

## 2015-07-14 DIAGNOSIS — Z8249 Family history of ischemic heart disease and other diseases of the circulatory system: Secondary | ICD-10-CM | POA: Diagnosis not present

## 2015-07-14 DIAGNOSIS — Y92239 Unspecified place in hospital as the place of occurrence of the external cause: Secondary | ICD-10-CM | POA: Diagnosis not present

## 2015-07-14 DIAGNOSIS — K219 Gastro-esophageal reflux disease without esophagitis: Secondary | ICD-10-CM | POA: Diagnosis present

## 2015-07-14 DIAGNOSIS — N179 Acute kidney failure, unspecified: Principal | ICD-10-CM | POA: Diagnosis present

## 2015-07-14 DIAGNOSIS — K317 Polyp of stomach and duodenum: Secondary | ICD-10-CM | POA: Diagnosis not present

## 2015-07-14 DIAGNOSIS — M1990 Unspecified osteoarthritis, unspecified site: Secondary | ICD-10-CM | POA: Diagnosis present

## 2015-07-14 DIAGNOSIS — E1122 Type 2 diabetes mellitus with diabetic chronic kidney disease: Secondary | ICD-10-CM | POA: Diagnosis present

## 2015-07-14 DIAGNOSIS — Z880 Allergy status to penicillin: Secondary | ICD-10-CM | POA: Diagnosis not present

## 2015-07-14 DIAGNOSIS — R809 Proteinuria, unspecified: Secondary | ICD-10-CM

## 2015-07-14 DIAGNOSIS — Z87440 Personal history of urinary (tract) infections: Secondary | ICD-10-CM | POA: Diagnosis not present

## 2015-07-14 DIAGNOSIS — R4701 Aphasia: Secondary | ICD-10-CM | POA: Diagnosis not present

## 2015-07-14 DIAGNOSIS — Z8673 Personal history of transient ischemic attack (TIA), and cerebral infarction without residual deficits: Secondary | ICD-10-CM | POA: Diagnosis not present

## 2015-07-14 DIAGNOSIS — R51 Headache: Secondary | ICD-10-CM

## 2015-07-14 DIAGNOSIS — I129 Hypertensive chronic kidney disease with stage 1 through stage 4 chronic kidney disease, or unspecified chronic kidney disease: Secondary | ICD-10-CM | POA: Diagnosis present

## 2015-07-14 DIAGNOSIS — R519 Headache, unspecified: Secondary | ICD-10-CM

## 2015-07-14 DIAGNOSIS — Z794 Long term (current) use of insulin: Secondary | ICD-10-CM | POA: Diagnosis not present

## 2015-07-14 DIAGNOSIS — N189 Chronic kidney disease, unspecified: Secondary | ICD-10-CM

## 2015-07-14 DIAGNOSIS — Z885 Allergy status to narcotic agent status: Secondary | ICD-10-CM

## 2015-07-14 DIAGNOSIS — E1159 Type 2 diabetes mellitus with other circulatory complications: Secondary | ICD-10-CM | POA: Insufficient documentation

## 2015-07-14 DIAGNOSIS — Z79899 Other long term (current) drug therapy: Secondary | ICD-10-CM | POA: Diagnosis not present

## 2015-07-14 DIAGNOSIS — R0600 Dyspnea, unspecified: Secondary | ICD-10-CM | POA: Diagnosis not present

## 2015-07-14 DIAGNOSIS — R4182 Altered mental status, unspecified: Secondary | ICD-10-CM | POA: Diagnosis not present

## 2015-07-14 DIAGNOSIS — N184 Chronic kidney disease, stage 4 (severe): Secondary | ICD-10-CM | POA: Diagnosis present

## 2015-07-14 DIAGNOSIS — Z7982 Long term (current) use of aspirin: Secondary | ICD-10-CM

## 2015-07-14 DIAGNOSIS — M810 Age-related osteoporosis without current pathological fracture: Secondary | ICD-10-CM

## 2015-07-14 DIAGNOSIS — Z9889 Other specified postprocedural states: Secondary | ICD-10-CM

## 2015-07-14 DIAGNOSIS — E1143 Type 2 diabetes mellitus with diabetic autonomic (poly)neuropathy: Secondary | ICD-10-CM | POA: Diagnosis present

## 2015-07-14 DIAGNOSIS — D472 Monoclonal gammopathy: Secondary | ICD-10-CM

## 2015-07-14 DIAGNOSIS — Z888 Allergy status to other drugs, medicaments and biological substances status: Secondary | ICD-10-CM | POA: Diagnosis not present

## 2015-07-14 DIAGNOSIS — Z933 Colostomy status: Secondary | ICD-10-CM

## 2015-07-14 DIAGNOSIS — E559 Vitamin D deficiency, unspecified: Secondary | ICD-10-CM

## 2015-07-14 DIAGNOSIS — Z886 Allergy status to analgesic agent status: Secondary | ICD-10-CM | POA: Diagnosis not present

## 2015-07-14 DIAGNOSIS — M79605 Pain in left leg: Secondary | ICD-10-CM | POA: Diagnosis not present

## 2015-07-14 DIAGNOSIS — T424X5A Adverse effect of benzodiazepines, initial encounter: Secondary | ICD-10-CM | POA: Diagnosis not present

## 2015-07-14 DIAGNOSIS — R072 Precordial pain: Secondary | ICD-10-CM

## 2015-07-14 DIAGNOSIS — N289 Disorder of kidney and ureter, unspecified: Secondary | ICD-10-CM

## 2015-07-14 LAB — CBC
HCT: 38.6 % (ref 36.0–46.0)
Hemoglobin: 13 g/dL (ref 12.0–15.0)
MCH: 32 pg (ref 26.0–34.0)
MCHC: 33.7 g/dL (ref 30.0–36.0)
MCV: 95.1 fL (ref 78.0–100.0)
PLATELETS: 221 10*3/uL (ref 150–400)
RBC: 4.06 MIL/uL (ref 3.87–5.11)
RDW: 14.2 % (ref 11.5–15.5)
WBC: 9.9 10*3/uL (ref 4.0–10.5)

## 2015-07-14 LAB — URINALYSIS, ROUTINE W REFLEX MICROSCOPIC
BILIRUBIN URINE: NEGATIVE
GLUCOSE, UA: NEGATIVE mg/dL
Hgb urine dipstick: NEGATIVE
KETONES UR: NEGATIVE mg/dL
NITRITE: NEGATIVE
PH: 5 (ref 5.0–8.0)
Protein, ur: >300 mg/dL — AB
Specific Gravity, Urine: 1.021 (ref 1.005–1.030)
Urobilinogen, UA: 0.2 mg/dL (ref 0.0–1.0)

## 2015-07-14 LAB — LIPASE, BLOOD: Lipase: 13 U/L — ABNORMAL LOW (ref 22–51)

## 2015-07-14 LAB — GLUCOSE, CAPILLARY: GLUCOSE-CAPILLARY: 154 mg/dL — AB (ref 65–99)

## 2015-07-14 LAB — URINE MICROSCOPIC-ADD ON

## 2015-07-14 LAB — COMPREHENSIVE METABOLIC PANEL
ALK PHOS: 172 U/L — AB (ref 38–126)
ALT: 37 U/L (ref 14–54)
AST: 51 U/L — AB (ref 15–41)
Albumin: 3.7 g/dL (ref 3.5–5.0)
Anion gap: 10 (ref 5–15)
BILIRUBIN TOTAL: 0.4 mg/dL (ref 0.3–1.2)
BUN: 33 mg/dL — AB (ref 6–20)
CHLORIDE: 108 mmol/L (ref 101–111)
CO2: 16 mmol/L — ABNORMAL LOW (ref 22–32)
CREATININE: 3.37 mg/dL — AB (ref 0.44–1.00)
Calcium: 9.5 mg/dL (ref 8.9–10.3)
GFR calc Af Amer: 14 mL/min — ABNORMAL LOW (ref >60)
GFR, EST NON AFRICAN AMERICAN: 12 mL/min — AB (ref >60)
Glucose, Bld: 70 mg/dL (ref 65–99)
Potassium: 4.9 mmol/L (ref 3.5–5.1)
Sodium: 134 mmol/L — ABNORMAL LOW (ref 135–145)
TOTAL PROTEIN: 7.8 g/dL (ref 6.5–8.1)

## 2015-07-14 LAB — MAGNESIUM: MAGNESIUM: 2.2 mg/dL (ref 1.7–2.4)

## 2015-07-14 LAB — I-STAT TROPONIN, ED: Troponin i, poc: 0.09 ng/mL (ref 0.00–0.08)

## 2015-07-14 LAB — CREATININE, SERUM
CREATININE: 3.24 mg/dL — AB (ref 0.44–1.00)
GFR calc non Af Amer: 13 mL/min — ABNORMAL LOW (ref >60)
GFR, EST AFRICAN AMERICAN: 15 mL/min — AB (ref >60)

## 2015-07-14 LAB — CBG MONITORING, ED: Glucose-Capillary: 60 mg/dL — ABNORMAL LOW (ref 65–99)

## 2015-07-14 LAB — TROPONIN I: TROPONIN I: 0.04 ng/mL — AB (ref <0.031)

## 2015-07-14 MED ORDER — ACETAMINOPHEN 325 MG PO TABS
325.0000 mg | ORAL_TABLET | Freq: Once | ORAL | Status: AC
  Administered 2015-07-14: 325 mg via ORAL
  Filled 2015-07-14: qty 1

## 2015-07-14 MED ORDER — ASPIRIN EC 81 MG PO TBEC
81.0000 mg | DELAYED_RELEASE_TABLET | Freq: Every day | ORAL | Status: DC
  Administered 2015-07-14 – 2015-07-19 (×6): 81 mg via ORAL
  Filled 2015-07-14 (×6): qty 1

## 2015-07-14 MED ORDER — SODIUM CHLORIDE 0.9 % IJ SOLN
3.0000 mL | Freq: Two times a day (BID) | INTRAMUSCULAR | Status: DC
  Administered 2015-07-16 – 2015-07-21 (×10): 3 mL via INTRAVENOUS

## 2015-07-14 MED ORDER — HYDRALAZINE HCL 20 MG/ML IJ SOLN
10.0000 mg | Freq: Four times a day (QID) | INTRAMUSCULAR | Status: DC | PRN
  Administered 2015-07-15 – 2015-07-17 (×2): 10 mg via INTRAVENOUS
  Filled 2015-07-14 (×3): qty 1

## 2015-07-14 MED ORDER — IOHEXOL 300 MG/ML  SOLN
50.0000 mL | Freq: Once | INTRAMUSCULAR | Status: AC | PRN
  Administered 2015-07-14: 25 mL via ORAL

## 2015-07-14 MED ORDER — DEXTROSE-NACL 5-0.9 % IV SOLN
INTRAVENOUS | Status: DC
  Administered 2015-07-14 – 2015-07-15 (×2): via INTRAVENOUS

## 2015-07-14 MED ORDER — AMLODIPINE BESYLATE 10 MG PO TABS: 10.0000 mg | ORAL_TABLET | Freq: Every day | ORAL | Status: DC

## 2015-07-14 MED ORDER — ALBUTEROL SULFATE (2.5 MG/3ML) 0.083% IN NEBU
2.5000 mg | INHALATION_SOLUTION | Freq: Four times a day (QID) | RESPIRATORY_TRACT | Status: DC
  Administered 2015-07-14: 2.5 mg via RESPIRATORY_TRACT
  Filled 2015-07-14: qty 3

## 2015-07-14 MED ORDER — ACETAMINOPHEN 325 MG PO TABS
650.0000 mg | ORAL_TABLET | Freq: Once | ORAL | Status: AC
  Administered 2015-07-14: 650 mg via ORAL
  Filled 2015-07-14: qty 2

## 2015-07-14 MED ORDER — LEVOTHYROXINE SODIUM 50 MCG PO TABS
50.0000 ug | ORAL_TABLET | Freq: Every day | ORAL | Status: DC
  Administered 2015-07-15 – 2015-07-21 (×7): 50 ug via ORAL
  Filled 2015-07-14: qty 2
  Filled 2015-07-14: qty 1
  Filled 2015-07-14: qty 2
  Filled 2015-07-14: qty 1
  Filled 2015-07-14: qty 2
  Filled 2015-07-14 (×2): qty 1

## 2015-07-14 MED ORDER — INSULIN ASPART 100 UNIT/ML ~~LOC~~ SOLN
0.0000 [IU] | Freq: Three times a day (TID) | SUBCUTANEOUS | Status: DC
  Administered 2015-07-15: 2 [IU] via SUBCUTANEOUS
  Administered 2015-07-15: 3 [IU] via SUBCUTANEOUS
  Administered 2015-07-16 – 2015-07-17 (×3): 2 [IU] via SUBCUTANEOUS
  Administered 2015-07-17: 3 [IU] via SUBCUTANEOUS
  Administered 2015-07-17: 2 [IU] via SUBCUTANEOUS
  Administered 2015-07-18: 3 [IU] via SUBCUTANEOUS
  Administered 2015-07-18: 5 [IU] via SUBCUTANEOUS
  Administered 2015-07-18 – 2015-07-19 (×2): 2 [IU] via SUBCUTANEOUS
  Administered 2015-07-19: 3 [IU] via SUBCUTANEOUS
  Administered 2015-07-19 – 2015-07-20 (×3): 2 [IU] via SUBCUTANEOUS
  Administered 2015-07-20: 3 [IU] via SUBCUTANEOUS
  Administered 2015-07-21: 1 [IU] via SUBCUTANEOUS
  Administered 2015-07-21: 3 [IU] via SUBCUTANEOUS

## 2015-07-14 MED ORDER — TRAMADOL HCL 50 MG PO TABS: 50.0000 mg | ORAL_TABLET | Freq: Four times a day (QID) | ORAL | Status: DC | PRN

## 2015-07-14 MED ORDER — ONDANSETRON HCL 4 MG/2ML IJ SOLN
4.0000 mg | Freq: Once | INTRAMUSCULAR | Status: AC
  Administered 2015-07-14: 4 mg via INTRAVENOUS
  Filled 2015-07-14: qty 2

## 2015-07-14 MED ORDER — HEPARIN SODIUM (PORCINE) 5000 UNIT/ML IJ SOLN
5000.0000 [IU] | Freq: Three times a day (TID) | INTRAMUSCULAR | Status: AC
  Administered 2015-07-14 – 2015-07-15 (×4): 5000 [IU] via SUBCUTANEOUS
  Filled 2015-07-14 (×6): qty 1

## 2015-07-14 MED ORDER — PANTOPRAZOLE SODIUM 40 MG IV SOLR
40.0000 mg | Freq: Two times a day (BID) | INTRAVENOUS | Status: DC
  Administered 2015-07-14 – 2015-07-18 (×8): 40 mg via INTRAVENOUS
  Filled 2015-07-14 (×8): qty 40

## 2015-07-14 MED ORDER — METOPROLOL TARTRATE 25 MG PO TABS
25.0000 mg | ORAL_TABLET | Freq: Two times a day (BID) | ORAL | Status: DC
  Administered 2015-07-14 – 2015-07-21 (×14): 25 mg via ORAL
  Filled 2015-07-14 (×14): qty 1

## 2015-07-14 MED ORDER — GUAIFENESIN ER 600 MG PO TB12: 1200.0000 mg | ORAL_TABLET | Freq: Two times a day (BID) | ORAL | Status: DC | PRN

## 2015-07-14 MED ORDER — AMLODIPINE BESYLATE 10 MG PO TABS
10.0000 mg | ORAL_TABLET | Freq: Every day | ORAL | Status: DC
  Administered 2015-07-14 – 2015-07-21 (×8): 10 mg via ORAL
  Filled 2015-07-14 (×9): qty 1

## 2015-07-14 MED ORDER — SODIUM CHLORIDE 0.9 % IV BOLUS (SEPSIS)
1000.0000 mL | Freq: Once | INTRAVENOUS | Status: AC
  Administered 2015-07-14: 1000 mL via INTRAVENOUS

## 2015-07-14 MED ORDER — SODIUM CHLORIDE 0.9 % IV SOLN: Freq: Once | INTRAVENOUS | Status: DC

## 2015-07-14 MED ORDER — METOCLOPRAMIDE HCL 5 MG/ML IJ SOLN
5.0000 mg | Freq: Four times a day (QID) | INTRAMUSCULAR | Status: DC
  Administered 2015-07-14 – 2015-07-18 (×15): 5 mg via INTRAVENOUS
  Filled 2015-07-14 (×15): qty 2

## 2015-07-14 MED ORDER — ALBUTEROL SULFATE (2.5 MG/3ML) 0.083% IN NEBU: 2.5000 mg | INHALATION_SOLUTION | Freq: Four times a day (QID) | RESPIRATORY_TRACT | Status: DC | PRN

## 2015-07-14 NOTE — ED Notes (Signed)
Pt ambulates to bathroom with assistance.

## 2015-07-14 NOTE — ED Notes (Signed)
I attempted to collect labs and was unsuccessful. 

## 2015-07-14 NOTE — H&P (Addendum)
[facility] History and Physical  [name]:[id number] DOB: January 20, 1936 DOA: 07/14/2015  Referring physician: PCP: Redge Gainer, MD   Chief Complaint: Intractable nausea   HPI:  79 year old female with a history of diabetes with gastroparesis, hypertension, CVA, chronic kidney disease stage III, monoclonal gammopathy, gastroesophageal reflux disease,fundic gland and hyperplastic gastric polyps, gastroparesis, diverticulitis with resection of most of her colon with ileostomy in place, fatty liver, elevated liver enzymes, followed by Dr. Hilarie Fredrickson for nausea who presents with chronic abdominal pain and nausea for about 2 months. She was last seen by GI on 04/17/15 for nausea that persists throughout the day, not associated with vomiting.She reports poor appetite and is unable to eat solid food 3 meals a day. She reports bowel movements are liquid as always and brown without blood or melena through her ostomy. She complains of dull but chronic abdominal pain.   She is using Zofran and Reglan and is also on pantoprazole 40 mg twice daily.  In her own words she states that she is eating 25% of what she normally eats for the last 2 months. CBG was found to be 60. She also reports multiple hypoglycemic events at home. She denies any diarrhea. Patient is on  Reglan 3 times a day. CT abdomen pelvis without contrast negative for obstruction. Is a history of recent EGD on 11/15 which showed   gastric polyps, which were benign without dysplasia.  Seen on 8/30 in the ER for nausea which improved with IV Reglan, cardiac enzymes were checked and were found to be negative. Symptoms were felt to be secondary to chronic gastroparesis. Patient was also found to have a urinary tract infection 8/30 with 11-20 white blood cells in her urine, recently completed a course of Macrodantin.            Review of Systems: negative for the following  Constitutional: Positive for appetite change and fatigue.  Negative for fever and chills.  Respiratory: Negative for cough, chest tightness and shortness of breath.  Cardiovascular: Negative for chest pain, palpitations and leg swelling.  Gastrointestinal: Positive for nausea and abdominal pain. Negative for vomiting and diarrhea.Genitourinary: Denies dysuria, urgency, frequency, hematuria, flank pain and difficulty urinating.  Musculoskeletal: Denies myalgias, back pain, joint swelling, arthralgias and gait problem.  Skin: Denies pallor, rash and wound.  Neurological: Denies dizziness, seizures, syncope, weakness, light-headedness, numbness and headaches.  Hematological: Denies adenopathy. Easy bruising, personal or family bleeding history  Psychiatric/Behavioral: Denies suicidal ideation, mood changes, confusion, nervousness, sleep disturbance and agitation       Past Medical History  Diagnosis Date  . Diverticulitis 2005    Colonoscopy--Dr. Sharlett Iles   . Diabetes mellitus without complication   . Hyperlipidemia   . Hypertension   . Stroke     Past history  . Hepatic steatosis   . Anemia   . Arthritis   . Chronic kidney disease (CKD)     past dialysis  . Status post dilation of esophageal narrowing   . MRSA infection 07/2009  . Proteinuria 09/23/2014  . MGUS (monoclonal gammopathy of unknown significance) 10/10/2014  . Hyperplastic colon polyp   . Fundic gland polyps of stomach, benign   . GERD (gastroesophageal reflux disease)   . Gastroparesis   . CVA (cerebral vascular accident)      Past Surgical History  Procedure Laterality Date  . Bilateral oophorectomy    . Rotator cuff repair Bilateral   . Carpal tunnel repair Bilateral   . Laparoscopic colostomy  for diverticulitis  . Nasal sinus surgery    . Tonsillectomy    . Replacement total knee Right   . Abdominal hernia repair      with ilestomy  . Partial colectomy        Social History:  reports that she has never smoked. She has never used smokeless tobacco.  She reports that she does not drink alcohol or use illicit drugs.    Allergies  Allergen Reactions  . Ace Inhibitors Other (See Comments)    unknown  . Actos [Pioglitazone Hydrochloride] Other (See Comments)    Jitters   . Aspirin     REACTION: GI upset  . Codeine     REACTION: hallucinations  . Hydromorphone Hcl Nausea And Vomiting  . Metformin And Related Nausea And Vomiting  . Penicillins     REACTION: "crazy feeling"  . Procaine Hcl Other (See Comments)    Pt goes crazy   . Statins Other (See Comments)    myalgias  . Sulfa Antibiotics     Family History  Problem Relation Age of Onset  . Diabetes Mother   . Heart disease Father 46    Died after bypass  . Diabetes Sister   . Colon cancer Neg Hx         Prior to Admission medications   Medication Sig Start Date End Date Taking? Authorizing Provider  acetaminophen (TYLENOL) 500 MG tablet Take 1,000 mg by mouth every 6 (six) hours as needed for moderate pain.    Yes Historical Provider, MD  amLODipine (NORVASC) 10 MG tablet TAKE 1 TABLET ONCE A DAY 01/20/15  Yes Chipper Herb, MD  aspirin EC 81 MG tablet Take 81 mg by mouth daily.   Yes Historical Provider, MD  B Complex-C-Folic Acid (MULTIVITAMIN, STRESS FORMULA) tablet Take 1 tablet by mouth daily.     Yes Historical Provider, MD  diazepam (VALIUM) 2 MG tablet TAKE 1 TABLET 1 HOUR BEFORE DENTAL APPOINTMENT 06/30/15  Yes Chipper Herb, MD  glucagon [facility]) 1 MG injection Inject into the muscle as needed for severe low BG 06/16/15  Yes Tammy Eckard, PHARMD  guaiFENesin (MUCINEX) 600 MG 12 hr tablet Take 1,200 mg by mouth 2 (two) times daily as needed for cough or to loosen phlegm.    Yes Historical Provider, MD  insulin lispro (HUMALOG KWIKPEN) 100 UNIT/ML KiwkPen Inject 5 to 12 units subcutaneously prior to lunch based on blood glucose reading. 06/16/15  Yes Tammy Eckard, PHARMD  insulin lispro protamine-lispro (HUMALOG MIX 50/50) (50-50) 100 UNIT/ML SUSP  injection Inject 45-70 Units into the skin 2 (two) times daily before a meal. Take 70 units every morning with breakfast and 45 units every evening with supper   Yes Historical Provider, MD  Insulin Pen Needle (PEN NEEDLES 31GX5/16") 31G X 8 MM MISC USE AS DIRECTED TWICE A DAY Patient taking differently: 1 each by Other route 3 (three) times daily. USE AS DIRECTED TWICE A DAY 05/27/15  Yes Chipper Herb, MD  Lancets [facility] ULTRASOFT) lancets CHECK BLOOD SUGAR 4 TIMES A DAY 04/07/15  Yes Chipper Herb, MD  levothyroxine (SYNTHROID, LEVOTHROID) 50 MCG tablet TAKE 1 TABLET DAILY 09/19/14  Yes Chipper Herb, MD  metoCLOPramide (REGLAN) 5 MG tablet Take 1 tab by mouth tid prior to breakfast, lunch and dinner / supper 06/16/15  Yes Tammy Eckard, PHARMD  metoprolol tartrate (LOPRESSOR) 25 MG tablet TAKE 1 & 1/2 TABLETS TWICE A DAY 04/01/15  Yes Deandria-Margaret Hassell Done,  FNP  mupirocin ointment (BACTROBAN) 2 % APPLY TO AFFECTED AREA AS DIRECTED 10/08/14  Yes Chipper Herb, MD  omega-3 acid ethyl esters (LOVAZA) 1 G capsule TAKE (2) CAPSULES TWICE DAILY. 09/06/14  Yes Chipper Herb, MD  ondansetron (ZOFRAN) 8 MG tablet TAKE 1/2 TO 1 TABLET THREE TIMES DAILY AS NEEDED. FIRST DOSE EARLY BEFORE BREAKFAST 07/04/15  Yes Jerene Bears, MD  ONE TOUCH ULTRA TEST test strip Test blood sugar qid prn. DX E11.8 07/11/15  Yes Chipper Herb, MD  pantoprazole (PROTONIX) 40 MG tablet TAKE (1) TABLET TWICE A DAY. 04/29/15  Yes Chipper Herb, MD  Polyethyl Glycol-Propyl Glycol 0.4-0.3 % SOLN Apply 1 drop to eye 3 (three) times daily as needed (dry eyes).   Yes Historical Provider, MD  cephALEXin (KEFLEX) 500 MG capsule Take 1 capsule (500 mg total) by mouth 2 (two) times daily. Patient not taking: Reported on 07/14/2015 07/01/15   Sherwood Gambler, MD  nitrofurantoin (MACRODANTIN) 100 MG capsule Take 1 capsule (100 mg total) by mouth 2 (two) times daily. Patient not taking: Reported on 07/14/2015 07/02/15   Chipper Herb, MD  ondansetron  [facility] ODT) 8 MG disintegrating tablet Take 0.5-1 tablets (4-8 mg total) by mouth 3 (three) times daily as needed for nausea or vomiting. Patient not taking: Reported on 07/14/2015 04/17/15   Jerene Bears, MD  traMADol (ULTRAM) 50 MG tablet Take 1 tablet (50 mg total) by mouth every 6 (six) hours as needed. Patient not taking: Reported on 06/16/2015 11/06/13   Sable Feil, MD     Physical Exam: Filed Vitals:   07/14/15 1700 07/14/15 1720 07/14/15 1800 07/14/15 1929  BP: 191/53 191/53 168/101   Pulse: 54 63 57   Temp:      TempSrc:      Resp: 18 18 23    SpO2: 97% 99% 99% 100%     Constitutional: Vital signs reviewed. Patient is  morbidly obese  in no acute distress and cooperative with exam. Alert and oriented x3.  Head: Normocephalic and atraumatic  Ear: TM normal bilaterally  Mouth: no erythema or exudates,dry oral mucosa  Eyes: PERRL, EOMI, conjunctivae normal, No scleral icterus.  Neck: Supple, Trachea midline normal ROM, No JVD, mass, thyromegaly, or carotid bruit present.  Cardiovascular: RRR, S1 normal, S2 normal, no MRG, pulses symmetric and intact bilaterally  Pulmonary/Chest: CTAB, no wheezes, rales, or rhonchi  Abdominal: Epigastric, RLQ, LUQ tenderness non-distended, bowel sounds are normal, no masses, organomegaly, or guarding present.  GU: no CVA tenderness Musculoskeletal: No joint deformities, erythema, or stiffness, ROM full and no nontender Ext: no edema and no cyanosis, pulses palpable bilaterally (DP and PT)  Hematology: no cervical, inginal, or axillary adenopathy.  Neurological: A&O x3, Strenght is normal and symmetric bilaterally, cranial nerve II-XII are grossly intact, no focal motor deficit, sensory intact to light touch bilaterally.  Skin: Warm, dry and intact. No rash, cyanosis, or clubbing.  Psychiatric: Normal mood and affect. speech and behavior is normal. Judgment and thought content normal. Cognition and memory are normal.      Data Review    Micro Results No results found for this or any previous visit (from the past 240 hour(s)).  Radiology Reports Ct Abdomen Pelvis Wo Contrast  07/14/2015   CLINICAL DATA:  Abdominal pain with nausea for 3 weeks.  EXAM: CT ABDOMEN AND PELVIS WITHOUT CONTRAST  TECHNIQUE: Multidetector CT imaging of the abdomen and pelvis was performed following the standard protocol without IV contrast.  COMPARISON:  Multiple exams, including 07/01/2015 and 07/11/2014  FINDINGS: Lower chest: Coronary artery atherosclerotic calcification along with calcification of the upper portion of the mitral valve. Faint calcification along the tracheobronchial tree at the lung bases, likely incidental.  Hepatobiliary: Unremarkable  Pancreas: Severely atrophic pancreas, with little in the way of recognizable pancreatic parenchymal tissue. This is a chronic appearance.  Spleen: Unremarkable  Adrenals/Urinary Tract: Mild bilateral renal atrophy, otherwise normal.  Stomach/Bowel: The ligament of Treitz appears to be lax, with the proximal jejunum extending caudad into the large peristomal hernia which contains multiple loops of bowel along with accompanying mesentery, in addition to the ostomy site. The colon appears to be entirely intra-abdominal. Anastomotic site at the rectosigmoid junction.  Vascular/Lymphatic: Aortoiliac atherosclerotic vascular disease.  Reproductive: Unremarkable  Other: No supplemental non-categorized findings.  Musculoskeletal: Articular cartilage thinning in both hips. Stable appearance of 70% compression fracture at T12. No change in the low intervertebral disc height at L5-S1.  IMPRESSION: 1. The patient has a large peristomal hernia on the left containing multiple loops of small bowel and mesentery, but this not appreciably changed compared to last year at this time, and there no findings of strangulation or obstruction. The the ligament of Treitz is lax. 2. Other imaging findings of potential clinical  significance: Coronary atherosclerosis; calcified mitral valve; chronically atrophic pancreas ; mild bilateral renal atrophy; aortoiliac atherosclerosis; mild degenerative cartilage thinning in both hips; chronic 70% compression fracture at T12.   Electronically Signed   By: Van Clines M.D.   On: 07/14/2015 18:00   Portable Chest 1 View  07/14/2015   CLINICAL DATA:  79 year old female with abdominal pain and nausea  EXAM: PORTABLE CHEST - 1 VIEW  COMPARISON:  Radiograph dated 07/01/2015  FINDINGS: Single-view of the chest demonstrate emphysematous changes of the lungs. No focal consolidation, pleural effusion, or pneumothorax. Stable cardiac silhouette. The osseous structures appear unremarkable.  IMPRESSION: No active disease.   Electronically Signed   By: Anner Crete M.D.   On: 07/14/2015 19:08   Dg Abd Acute W/chest  07/01/2015   CLINICAL DATA:  Patient with left-sided abdominal pain above colostomy bag. Persistent cough for multiple weeks.  EXAM: DG ABDOMEN ACUTE W/ 1V CHEST  COMPARISON:  Chest radiograph 03/09/2015  FINDINGS: Monitoring leads overlie the patient. Stable cardiac and mediastinal contours. No consolidative pulmonary opacities. No pleural effusion or pneumothorax.  Gas is demonstrated within nondilated loops of large and small bowel within the abdomen. The entire left hemi abdomen is not included on this radiograph however there may potentially be a left parastomal hernia. Lumbar spine degenerative changes.  IMPRESSION: The entire left hemi abdomen is not included on current radiograph however there may be a left parastomal hernia. No evidence for small bowel obstruction.  No acute cardiopulmonary process.   Electronically Signed   By: Lovey Newcomer M.D.   On: 07/01/2015 18:48     CBC  Recent Labs Lab 07/14/15 1435  WBC 9.9  HGB 13.0  HCT 38.6  PLT 221  MCV 95.1  MCH 32.0  MCHC 33.7  RDW 14.2    Chemistries   Recent Labs Lab 07/14/15 1435 07/14/15 1854  NA  134*  --   K 4.9  --   CL 108  --   CO2 16*  --   GLUCOSE 70  --   BUN 33*  --   CREATININE 3.37* 3.24*  CALCIUM 9.5  --   MG  --  2.2  AST 51*  --  ALT 37  --   ALKPHOS 172*  --   BILITOT 0.4  --    ------------------------------------------------------------------------------------------------------------------ CrCl cannot be calculated (Unknown ideal weight.). ------------------------------------------------------------------------------------------------------------------ No results for input(s): HGBA1C in the last 72 hours. ------------------------------------------------------------------------------------------------------------------ No results for input(s): CHOL, HDL, LDLCALC, TRIG, CHOLHDL, LDLDIRECT in the last 72 hours. ------------------------------------------------------------------------------------------------------------------ No results for input(s): TSH, T4TOTAL, T3FREE, THYROIDAB in the last 72 hours.  Invalid input(s): FREET3 ------------------------------------------------------------------------------------------------------------------ No results for input(s): VITAMINB12, FOLATE, FERRITIN, TIBC, IRON, RETICCTPCT in the last 72 hours.  Coagulation profile No results for input(s): INR, PROTIME in the last 168 hours.  No results for input(s): DDIMER in the last 72 hours.  Cardiac Enzymes  Recent Labs Lab 07/14/15 1854  TROPONINI 0.04*   ------------------------------------------------------------------------------------------------------------------ Invalid input(s): POCBNP   CBG:  Recent Labs Lab 07/14/15 1404  GLUCAP 60*       EKG: Independently reviewed.    Assessment/Plan Principal Problem:   Acute on chronic kidney failure-likely secondary to dehydration, patient started on D5 normal saline, given recent episodes of hypoglycemia, will repeat labs in the morning if continues to worsen, consider nephrology consultation, patient sees  Dr. Florene Glen  Nausea/gastroparesis/gastroesophageal reflux disease/history of gastric polyps-could have been exacerbated due to recent urinary tract infection, no recent history of viral gastroenteritis. continue IV Reglan, clear liquid diet, will consult gastroenterology in the morning given exacerbation of symptoms for the last 3 weeks, patient. May need an EGD in the morning. Continue patient on IV Protonix twice a day.    Essential hypertension-uncontrolled in the ER, continue home medications, prn hydralazine     Chronic kidney disease-stage 3-4-baseline creatinine around 2.5    CVA (cerebral infarction), past history-currently on no antiplatelets therapy    Ileostomy secondary to diverticulitis-monitor    Gastroparesis due to DM-continue IV Reglan, please request GI consultation in the morning, will keep nothing by mouth after midnight for possible EGD   Diabetes mellitus, started on D5, check Accu-Cheks every 4 hours and sliding scale insulin, patient uses Humalog 50/50 at home.  Recent UTI-UA negative this admission  Code Status:   full Family Communication: bedside Disposition Plan: admit   Total time spent 55 minutes.Greater than 50% of this time was spent in counseling, explanation of diagnosis, planning of further management, and coordination of care  [facility] Pager [phone]  If 7PM-7AM, please contact night-coverage [email or web address] Password [email or web address] 07/14/2015, 8:16 PM

## 2015-07-14 NOTE — ED Notes (Signed)
Abdominal pain with nausea no vomiting x 3 weeks.  No diarrhea.  No fever.  No change in urination. Pt has an ileostomy.

## 2015-07-14 NOTE — ED Provider Notes (Signed)
CSN: [id number]     Arrival date & time 07/14/15  1251 History   First MD Initiated Contact with Patient 07/14/15 1503     Chief Complaint  Patient presents with  . Abdominal Pain  . Nausea     (Consider location/radiation/quality/duration/timing/severity/associated sxs/prior Treatment) HPI [name] is a 79 y.o. female history of diverticulitis, diabetes, hypertension, CVA, anemia, chronic kidney disease with past dialysis, presents to emergency department complaining of nausea, weakness, abdominal pain. Patient states that she has had similar episode 4 months ago and it improved. She states the symptoms started approximately a month ago. She was seen here 2 weeks ago and was discharged home. Since then she states she hasn't been eating. States she has no appetite. She reports nausea, upper abdominal pain. She denies eating making her symptoms worse. She states that nothing that she can think of making her symptoms better or worse. Patient does have ileostomy states that her output has not changed, stool color is normal. Denies any blood in stool. Patient states that she has been trying to get in with her gastroenterologist but they haven't been able to get her in. Denies fever or chills. Denies any other complaints.  Past Medical History  Diagnosis Date  . Diverticulitis 2005    Colonoscopy--Dr. Sharlett Iles   . Diabetes mellitus without complication   . Hyperlipidemia   . Hypertension   . Stroke     Past history  . Hepatic steatosis   . Anemia   . Arthritis   . Chronic kidney disease (CKD)     past dialysis  . Status post dilation of esophageal narrowing   . MRSA infection 07/2009  . Proteinuria 09/23/2014  . MGUS (monoclonal gammopathy of unknown significance) 10/10/2014  . Hyperplastic colon polyp   . Fundic gland polyps of stomach, benign   . GERD (gastroesophageal reflux disease)   . Gastroparesis   . CVA (cerebral vascular accident)    Past Surgical History   Procedure Laterality Date  . Bilateral oophorectomy    . Rotator cuff repair Bilateral   . Carpal tunnel repair Bilateral   . Laparoscopic colostomy      for diverticulitis  . Nasal sinus surgery    . Tonsillectomy    . Replacement total knee Right   . Abdominal hernia repair      with ilestomy  . Partial colectomy     Family History  Problem Relation Age of Onset  . Diabetes Mother   . Heart disease Father 35    Died after bypass  . Diabetes Sister   . Colon cancer Neg Hx    Social History  Substance Use Topics  . Smoking status: Never Smoker   . Smokeless tobacco: Never Used  . Alcohol Use: No   OB History    No data available     Review of Systems  Constitutional: Positive for appetite change and fatigue. Negative for fever and chills.  Respiratory: Negative for cough, chest tightness and shortness of breath.   Cardiovascular: Negative for chest pain, palpitations and leg swelling.  Gastrointestinal: Positive for nausea and abdominal pain. Negative for vomiting and diarrhea.  Genitourinary: Negative for dysuria, flank pain and pelvic pain.  Musculoskeletal: Negative for myalgias, arthralgias, neck pain and neck stiffness.  Skin: Negative for rash.  Neurological: Positive for weakness. Negative for dizziness and headaches.  All other systems reviewed and are negative.     Allergies  Ace inhibitors; Actos; Aspirin; Codeine; Hydromorphone hcl; Metformin and related;  Penicillins; Procaine hcl; Statins; and Sulfa antibiotics  Home Medications   Prior to Admission medications   Medication Sig Start Date End Date Taking? Authorizing Provider  acetaminophen (TYLENOL) 500 MG tablet Take 1,000 mg by mouth every 6 (six) hours as needed for moderate pain.    Yes Historical Provider, MD  amLODipine (NORVASC) 10 MG tablet TAKE 1 TABLET ONCE A DAY 01/20/15  Yes Chipper Herb, MD  aspirin EC 81 MG tablet Take 81 mg by mouth daily.   Yes Historical Provider, MD  B  Complex-C-Folic Acid (MULTIVITAMIN, STRESS FORMULA) tablet Take 1 tablet by mouth daily.     Yes Historical Provider, MD  diazepam (VALIUM) 2 MG tablet TAKE 1 TABLET 1 HOUR BEFORE DENTAL APPOINTMENT 06/30/15  Yes Chipper Herb, MD  glucagon [facility] EM[facility]) 1 MG injection Inject into the muscle as needed for severe low BG 06/16/15  Yes Tammy Eckard, PHARMD  guaiFENesin (MUCINEX) 600 MG 12 hr tablet Take 1,200 mg by mouth 2 (two) times daily as needed for cough or to loosen phlegm.    Yes Historical Provider, MD  insulin lispro (HUMALOG KWIKPEN) 100 UNIT/ML KiwkPen Inject 5 to 12 units subcutaneously prior to lunch based on blood glucose reading. 06/16/15  Yes Tammy Eckard, PHARMD  insulin lispro protamine-lispro (HUMALOG MIX 50/50) (50-50) 100 UNIT/ML SUSP injection Inject 45-70 Units into the skin 2 (two) times daily before a meal. Take 70 units every morning with breakfast and 45 units every evening with supper   Yes Historical Provider, MD  Insulin Pen Needle (PEN NEEDLES 31GX5/16") 31G X 8 MM MISC USE AS DIRECTED TWICE A DAY Patient taking differently: 1 each by Other route 3 (three) times daily. USE AS DIRECTED TWICE A DAY 05/27/15  Yes Chipper Herb, MD  Lancets [facility] ULTRASOFT) lancets CHECK BLOOD SUGAR 4 TIMES A DAY 04/07/15  Yes Chipper Herb, MD  levothyroxine (SYNTHROID, LEVOTHROID) 50 MCG tablet TAKE 1 TABLET DAILY 09/19/14  Yes Chipper Herb, MD  metoCLOPramide (REGLAN) 5 MG tablet Take 1 tab by mouth tid prior to breakfast, lunch and dinner / supper 06/16/15  Yes Tammy Eckard, PHARMD  metoprolol tartrate (LOPRESSOR) 25 MG tablet TAKE 1 & 1/2 TABLETS TWICE A DAY 04/01/15  Yes Keziyah-Margaret Hassell Done, FNP  mupirocin ointment (BACTROBAN) 2 % APPLY TO AFFECTED AREA AS DIRECTED 10/08/14  Yes Chipper Herb, MD  omega-3 acid ethyl esters (LOVAZA) 1 G capsule TAKE (2) CAPSULES TWICE DAILY. 09/06/14  Yes Chipper Herb, MD  ondansetron (ZOFRAN) 8 MG tablet TAKE 1/2 TO 1 TABLET THREE TIMES DAILY AS  NEEDED. FIRST DOSE EARLY BEFORE BREAKFAST 07/04/15  Yes Jerene Bears, MD  ONE TOUCH ULTRA TEST test strip Test blood sugar qid prn. DX E11.8 07/11/15  Yes Chipper Herb, MD  pantoprazole (PROTONIX) 40 MG tablet TAKE (1) TABLET TWICE A DAY. 04/29/15  Yes Chipper Herb, MD  Polyethyl Glycol-Propyl Glycol 0.4-0.3 % SOLN Apply 1 drop to eye 3 (three) times daily as needed (dry eyes).   Yes Historical Provider, MD  cephALEXin (KEFLEX) 500 MG capsule Take 1 capsule (500 mg total) by mouth 2 (two) times daily. Patient not taking: Reported on 07/14/2015 07/01/15   Sherwood Gambler, MD  nitrofurantoin (MACRODANTIN) 100 MG capsule Take 1 capsule (100 mg total) by mouth 2 (two) times daily. Patient not taking: Reported on 07/14/2015 07/02/15   Chipper Herb, MD  ondansetron [facility] ODT) 8 MG disintegrating tablet Take 0.5-1 tablets (4-8 mg total)  by mouth 3 (three) times daily as needed for nausea or vomiting. Patient not taking: Reported on 07/14/2015 04/17/15   Jerene Bears, MD  traMADol (ULTRAM) 50 MG tablet Take 1 tablet (50 mg total) by mouth every 6 (six) hours as needed. Patient not taking: Reported on 06/16/2015 11/06/13   Sable Feil, MD   BP 179/48 mmHg  Pulse 54  Temp(Src) 98.2 F (36.8 C) (Oral)  Resp 18  SpO2 99% Physical Exam  Constitutional: She is oriented to person, place, and time. She appears well-developed and well-nourished. No distress.  HENT:  Head: Normocephalic.  Oral mucosa dry  Eyes: Conjunctivae are normal.  Neck: Normal range of motion. Neck supple.  Cardiovascular: Normal rate, regular rhythm and normal heart sounds.   Pulmonary/Chest: Effort normal and breath sounds normal. No respiratory distress. She has no wheezes. She has no rales.  Abdominal: Soft. Bowel sounds are normal. She exhibits no distension. There is tenderness. There is no rebound.  Epigastric, RLQ, LUQ tenderness.   Musculoskeletal: She exhibits no edema.  Neurological: She is alert and oriented to person,  place, and time.  Skin: Skin is warm and dry.  Psychiatric: She has a normal mood and affect. Her behavior is normal.  Nursing note and vitals reviewed.   ED Course  Procedures (including critical care time) Labs Review Labs Reviewed  LIPASE, BLOOD - Abnormal; Notable for the following:    Lipase 13 (*)    All other components within normal limits  COMPREHENSIVE METABOLIC PANEL - Abnormal; Notable for the following:    Sodium 134 (*)    CO2 16 (*)    BUN 33 (*)    Creatinine, Ser 3.37 (*)    AST 51 (*)    Alkaline Phosphatase 172 (*)    GFR calc non Af Amer 12 (*)    GFR calc Af Amer 14 (*)    All other components within normal limits  CBG MONITORING, ED - Abnormal; Notable for the following:    Glucose-Capillary 60 (*)    All other components within normal limits  CBC  URINALYSIS, ROUTINE W REFLEX MICROSCOPIC (NOT AT [facility])  I-STAT TROPOININ, ED    Imaging Review No results found. I have personally reviewed and evaluated these images and lab results as part of my medical decision-making.   EKG Interpretation None      MDM   Final diagnoses:  Dehydration  Renal insufficiency  Abdominal pain, unspecified abdominal location  Secondary hypertension, unspecified    Patient seen and examined. Patient with nausea, abdominal pain, weakness for 3-4 weeks. Patient does have ileostomy. Normal output, no blood in her stool. Labs already obtained by triage and resulted. Showing worsening creatinine of 3.37 from her baseline of 2.7. Elevated BUN as well. CBC is normal. Will get CT abdomen and pelvis. Fluid started.  6:22 PM CT with no acute findings. I'm worried about patient's creatinine, given her history of renal failure and dialysis. I do think that's probably from dehydration. She is receiving IV fluids. Discussed with Dr. Zenia Resides who recommended inpatient admission. Will call hospitalist  Filed Vitals:   07/14/15 1720 07/14/15 1800 07/14/15 1929 07/14/15 2048  BP: 191/53  168/101  178/48  Pulse: 63 57  65  Temp:    98.3 F (36.8 C)  TempSrc:    Oral  Resp: 18 23  18   Height:    5\' 1"  (1.549 m)  Weight:    180 lb 3.2 oz (81.738 kg)  SpO2:  99% 99% 100% 97%     Jeannett Senior, PA-C 07/15/15 0042  Lacretia Leigh, MD 07/15/15 [phone]

## 2015-07-14 NOTE — Telephone Encounter (Signed)
Offered pt an appt for tomorrow but pt states she cannot wait. Pts husband states she is having a lot of nausea and has to be seen today. Pts husband states they will go to the ER. Let husband know he can call back if she wants to be seen tomorrow.

## 2015-07-14 NOTE — ED Notes (Signed)
Bed: WA05 Expected date:  Expected time:  Means of arrival:  Comments: Hold for triage 

## 2015-07-15 ENCOUNTER — Encounter: Payer: Self-pay | Admitting: Internal Medicine

## 2015-07-15 ENCOUNTER — Encounter: Payer: Self-pay | Admitting: Family Medicine

## 2015-07-15 DIAGNOSIS — E1143 Type 2 diabetes mellitus with diabetic autonomic (poly)neuropathy: Secondary | ICD-10-CM

## 2015-07-15 DIAGNOSIS — R110 Nausea: Secondary | ICD-10-CM

## 2015-07-15 DIAGNOSIS — E440 Moderate protein-calorie malnutrition: Secondary | ICD-10-CM | POA: Insufficient documentation

## 2015-07-15 LAB — COMPREHENSIVE METABOLIC PANEL
ALK PHOS: 139 U/L — AB (ref 38–126)
ALT: 31 U/L (ref 14–54)
AST: 48 U/L — AB (ref 15–41)
Albumin: 3 g/dL — ABNORMAL LOW (ref 3.5–5.0)
Anion gap: 9 (ref 5–15)
BILIRUBIN TOTAL: 0.3 mg/dL (ref 0.3–1.2)
BUN: 30 mg/dL — AB (ref 6–20)
CALCIUM: 8.5 mg/dL — AB (ref 8.9–10.3)
CHLORIDE: 112 mmol/L — AB (ref 101–111)
CO2: 13 mmol/L — ABNORMAL LOW (ref 22–32)
CREATININE: 3.01 mg/dL — AB (ref 0.44–1.00)
GFR, EST AFRICAN AMERICAN: 16 mL/min — AB (ref >60)
GFR, EST NON AFRICAN AMERICAN: 14 mL/min — AB (ref >60)
Glucose, Bld: 237 mg/dL — ABNORMAL HIGH (ref 65–99)
Potassium: 4.6 mmol/L (ref 3.5–5.1)
Sodium: 134 mmol/L — ABNORMAL LOW (ref 135–145)
Total Protein: 6.2 g/dL — ABNORMAL LOW (ref 6.5–8.1)

## 2015-07-15 LAB — CBC
HCT: 33.7 % — ABNORMAL LOW (ref 36.0–46.0)
Hemoglobin: 11.2 g/dL — ABNORMAL LOW (ref 12.0–15.0)
MCH: 31.3 pg (ref 26.0–34.0)
MCHC: 33.2 g/dL (ref 30.0–36.0)
MCV: 94.1 fL (ref 78.0–100.0)
PLATELETS: 169 10*3/uL (ref 150–400)
RBC: 3.58 MIL/uL — AB (ref 3.87–5.11)
RDW: 14.3 % (ref 11.5–15.5)
WBC: 6.9 10*3/uL (ref 4.0–10.5)

## 2015-07-15 LAB — TROPONIN I
Troponin I: 0.04 ng/mL — ABNORMAL HIGH (ref <0.031)
Troponin I: 0.04 ng/mL — ABNORMAL HIGH (ref <0.031)

## 2015-07-15 LAB — GLUCOSE, CAPILLARY
GLUCOSE-CAPILLARY: 225 mg/dL — AB (ref 65–99)
GLUCOSE-CAPILLARY: 161 mg/dL — AB (ref 65–99)
GLUCOSE-CAPILLARY: 180 mg/dL — AB (ref 65–99)
GLUCOSE-CAPILLARY: 237 mg/dL — AB (ref 65–99)
Glucose-Capillary: 145 mg/dL — ABNORMAL HIGH (ref 65–99)
Glucose-Capillary: 146 mg/dL — ABNORMAL HIGH (ref 65–99)

## 2015-07-15 LAB — TSH: TSH: 1.706 u[IU]/mL (ref 0.350–4.500)

## 2015-07-15 MED ORDER — ZOLPIDEM TARTRATE 5 MG PO TABS
5.0000 mg | ORAL_TABLET | Freq: Every evening | ORAL | Status: DC | PRN
  Administered 2015-07-15 – 2015-07-16 (×2): 5 mg via ORAL
  Filled 2015-07-15 (×2): qty 1

## 2015-07-15 MED ORDER — ZOLPIDEM TARTRATE 5 MG PO TABS
5.0000 mg | ORAL_TABLET | Freq: Once | ORAL | Status: AC
  Administered 2015-07-15: 5 mg via ORAL
  Filled 2015-07-15: qty 1

## 2015-07-15 MED ORDER — SODIUM CHLORIDE 0.9 % IV SOLN
250.0000 mg | Freq: Four times a day (QID) | INTRAVENOUS | Status: DC
  Administered 2015-07-15 – 2015-07-17 (×7): 250 mg via INTRAVENOUS
  Filled 2015-07-15 (×9): qty 5

## 2015-07-15 MED ORDER — ACETAMINOPHEN 325 MG PO TABS
650.0000 mg | ORAL_TABLET | Freq: Four times a day (QID) | ORAL | Status: DC | PRN
  Administered 2015-07-15 – 2015-07-19 (×3): 650 mg via ORAL
  Filled 2015-07-15 (×3): qty 2

## 2015-07-15 MED ORDER — SODIUM CHLORIDE 0.9 % IV SOLN
INTRAVENOUS | Status: DC
  Administered 2015-07-15 – 2015-07-17 (×4): via INTRAVENOUS

## 2015-07-15 MED ORDER — HYDRALAZINE HCL 50 MG PO TABS
50.0000 mg | ORAL_TABLET | Freq: Three times a day (TID) | ORAL | Status: DC
  Administered 2015-07-15 – 2015-07-21 (×19): 50 mg via ORAL
  Filled 2015-07-15 (×18): qty 1

## 2015-07-15 NOTE — Progress Notes (Signed)
Initial Nutrition Assessment  DOCUMENTATION CODES:   Non-severe (moderate) malnutrition in context of acute illness/injury, Obesity unspecified  INTERVENTION:  - RD will continue to monitor for needs with diet advancement  NUTRITION DIAGNOSIS:   Inadequate oral intake related to inability to eat as evidenced by NPO status.  GOAL:   Patient will meet greater than or equal to 90% of their needs  MONITOR:   Diet advancement, Weight trends, Labs, I & O's  REASON FOR ASSESSMENT:   Malnutrition Screening Tool  ASSESSMENT:   79 year old female with a history of diabetes with gastroparesis, hypertension, CVA, chronic kidney disease stage III, monoclonal gammopathy, gastroesophageal reflux disease,fundic gland and hyperplastic gastric polyps, gastroparesis, diverticulitis with resection of most of her colon with ileostomy in place, fatty liver, elevated liver enzymes, followed by Dr. Hilarie Fredrickson for nausea who presents with chronic abdominal pain and nausea for about 2 months. She was last seen by GI on 04/17/15 for nausea that persists throughout the day, not associated with vomiting.She reports poor appetite and is unable to eat solid food 3 meals a day. She reports bowel movements are liquid as always and brown without blood or melena through her ostomy. She complains of dull but chronic abdominal pain. She is using Zofran and Reglan and is also on pantoprazole 40 mg twice daily.  Pt seen for MST. BMI indicates obesity. Pt has been NPO since admission and unable to meet needs. She states that for 2-3 weeks PTA she has had a poor appetite and was typically eating foods such as applesauce and jello. She states she was having constant abdominal pain and nausea which was not caused by or exacerbated by PO intakes. She denies every vomiting PTA with intakes.   She states that pain and nausea is ongoing this AM. She is unsure of recent weight trends but states that her UBW is 189 lbs. This indicates 9  lb weight loss (5% wt loss) in less than 1 month which is significant for time frame.  Husband states that outpatient MD stated that gluten sensitivity may be at play but he is unsure of the details of this.   No muscle or fat wasting noted. Medications reviewed; Reglan order in place and pt was taking this PTA. Labs reviewed; CBGs: 60-225 mg/dL, Na: 134 mmol/L, Cl: 112 mmol/L, BUN/creatinine elevated, Ca: 8.5 mg/dL, GFR: 14.   Diet Order:  Diet NPO time specified  Skin:  Reviewed, no issues  Last BM:  9/12  Height:   Ht Readings from Last 1 Encounters:  07/14/15 5\' 1"  (1.549 m)    Weight:   Wt Readings from Last 1 Encounters:  07/14/15 180 lb 3.2 oz (81.738 kg)    Ideal Body Weight:  47.73 kg (kg)  BMI:  Body mass index is 34.07 kg/(m^2).  Estimated Nutritional Needs:   Kcal:  1300-1500  Protein:  60-70 grams  Fluid:  2 L/day  EDUCATION NEEDS:   No education needs identified at this time     Jarome Matin, RD, LDN Inpatient Clinical Dietitian Pager # [phone] After hours/weekend pager # [phone]

## 2015-07-15 NOTE — Progress Notes (Signed)
[facility] PROGRESS NOTE  [name] VOJ:[id number] DOB: 1935-12-09 DOA: 07/14/2015 PCP: Redge Gainer, MD  Assessment/Plan: Principal Problem:   Acute on chronic kidney failure Active Problems:   Essential hypertension   Chronic kidney disease   CVA (cerebral infarction), past history   Ileostomy secondary to diverticulitis   Gastroparesis due to DM   Acute renal failure superimposed on stage 4 chronic kidney disease   Malnutrition of moderate degree    Acute on chronic kidney failure-likely secondary to dehydration, continue hydration with normal saline, discontinue D5 as hypoglycemia seems to have resolved, renal failure slowly improving, hold off a nephrology consultation today, patient sees Dr. Florene Glen  Nausea/gastroparesis/gastroesophageal reflux disease/history of gastric polyps-could have been exacerbated due to recent urinary tract infection, no recent history of viral gastroenteritis. continue IV Reglan, clear liquid diet, consulted gastroenterology, EGD in the morning given exacerbation of symptoms for the last 3 weeks, patient.  . Continue patient on IV Protonix twice a day.   Essential hypertension-uncontrolled in the ER, continue home medications, prn hydralazine. Patient started on scheduled hydralazine. Continue Norvasc and metoprolol  Abnormal troponin-slight elevation in the setting of renal failure, 2-D echo to rule out wall motion abnormalities   Chronic kidney disease-stage 3-4-baseline creatinine around 2.5   CVA (cerebral infarction), past history-currently on no antiplatelets therapy   Ileostomy secondary to diverticulitis-monitor   Gastroparesis due to DM-continue IV Reglan, please request GI consultation in the morning, will keep nothing by mouth after midnight for possible EGD  Diabetes mellitus, started on D5, now switched to normal saline, hemoglobin A1c pending, check Accu-Cheks every 4 hours and sliding scale insulin, patient  uses Humalog 50/50 at home.  Recent UTI-UA negative this admission    Code Status:      Code Status Orders        Start     Ordered   07/14/15 1841  Full code   Continuous     07/14/15 1843     Family Communication: family updated about patient's clinical progress Disposition Plan:  As above    Brief narrative: 79 year old female with a history of diabetes with gastroparesis, hypertension, CVA, chronic kidney disease stage III, monoclonal gammopathy, gastroesophageal reflux disease,fundic gland and hyperplastic gastric polyps, gastroparesis, diverticulitis with resection of most of her colon with ileostomy in place, fatty liver, elevated liver enzymes, followed by Dr. Hilarie Fredrickson for nausea who presents with chronic abdominal pain and nausea for about 2 months. She was last seen by GI on 04/17/15 for nausea that persists throughout the day, not associated with vomiting.She reports poor appetite and is unable to eat solid food 3 meals a day. She reports bowel movements are liquid as always and brown without blood or melena through her ostomy. She complains of dull but chronic abdominal pain. She is using Zofran and Reglan and is also on pantoprazole 40 mg twice daily.  In her own words she states that she is eating 25% of what she normally eats for the last 2 months. CBG was found to be 60. She also reports multiple hypoglycemic events at home. She denies any diarrhea. Patient is on Reglan 3 times a day. CT abdomen pelvis without contrast negative for obstruction. Is a history of recent EGD on 11/15 which showed gastric polyps, which were benign without dysplasia.  Seen on 8/30 in the ER for nausea which improved with IV Reglan, cardiac enzymes were checked and were found to be negative. Symptoms were felt to be  secondary to chronic gastroparesis. Patient was also found to have a urinary tract infection 8/30 with 11-20 white blood cells in her urine, recently completed a course of  Macrodantin.           Consultants:  Gastroenterology  Procedures:  None  Antibiotics: Anti-infectives    None         HPI/Subjective: Continues to endorse nausea, hypertensive overnight  Objective: Filed Vitals:   07/14/15 1929 07/14/15 2048 07/15/15 0559 07/15/15 0723  BP:  178/48 190/54 175/48  Pulse:  65 66   Temp:  98.3 F (36.8 C) 98.1 F (36.7 C)   TempSrc:  Oral Oral   Resp:  18 18   Height:  5\' 1"  (1.549 m)    Weight:  81.738 kg (180 lb 3.2 oz)    SpO2: 100% 97% 94%     Intake/Output Summary (Last 24 hours) at 07/15/15 1247 Last data filed at 07/15/15 3810  Gross per 24 hour  Intake    444 ml  Output    425 ml  Net     19 ml    Exam:  General: No acute respiratory distress Lungs: Clear to auscultation bilaterally without wheezes or crackles Cardiovascular: Regular rate and rhythm without murmur gallop or rub normal S1 and S2 Abdomen: Nontender, nondistended, soft, bowel sounds positive, no rebound, no ascites, no appreciable mass Extremities: No significant cyanosis, clubbing, or edema bilateral lower extremities     Data Review   Micro Results No results found for this or any previous visit (from the past 240 hour(s)).  Radiology Reports Ct Abdomen Pelvis Wo Contrast  07/14/2015   CLINICAL DATA:  Abdominal pain with nausea for 3 weeks.  EXAM: CT ABDOMEN AND PELVIS WITHOUT CONTRAST  TECHNIQUE: Multidetector CT imaging of the abdomen and pelvis was performed following the standard protocol without IV contrast.  COMPARISON:  Multiple exams, including 07/01/2015 and 07/11/2014  FINDINGS: Lower chest: Coronary artery atherosclerotic calcification along with calcification of the upper portion of the mitral valve. Faint calcification along the tracheobronchial tree at the lung bases, likely incidental.  Hepatobiliary: Unremarkable  Pancreas: Severely atrophic pancreas, with little in the way of recognizable pancreatic parenchymal tissue. This  is a chronic appearance.  Spleen: Unremarkable  Adrenals/Urinary Tract: Mild bilateral renal atrophy, otherwise normal.  Stomach/Bowel: The ligament of Treitz appears to be lax, with the proximal jejunum extending caudad into the large peristomal hernia which contains multiple loops of bowel along with accompanying mesentery, in addition to the ostomy site. The colon appears to be entirely intra-abdominal. Anastomotic site at the rectosigmoid junction.  Vascular/Lymphatic: Aortoiliac atherosclerotic vascular disease.  Reproductive: Unremarkable  Other: No supplemental non-categorized findings.  Musculoskeletal: Articular cartilage thinning in both hips. Stable appearance of 70% compression fracture at T12. No change in the low intervertebral disc height at L5-S1.  IMPRESSION: 1. The patient has a large peristomal hernia on the left containing multiple loops of small bowel and mesentery, but this not appreciably changed compared to last year at this time, and there no findings of strangulation or obstruction. The the ligament of Treitz is lax. 2. Other imaging findings of potential clinical significance: Coronary atherosclerosis; calcified mitral valve; chronically atrophic pancreas ; mild bilateral renal atrophy; aortoiliac atherosclerosis; mild degenerative cartilage thinning in both hips; chronic 70% compression fracture at T12.   Electronically Signed   By: Van Clines M.D.   On: 07/14/2015 18:00   Portable Chest 1 View  07/14/2015   CLINICAL DATA:  79 year old  female with abdominal pain and nausea  EXAM: PORTABLE CHEST - 1 VIEW  COMPARISON:  Radiograph dated 07/01/2015  FINDINGS: Single-view of the chest demonstrate emphysematous changes of the lungs. No focal consolidation, pleural effusion, or pneumothorax. Stable cardiac silhouette. The osseous structures appear unremarkable.  IMPRESSION: No active disease.   Electronically Signed   By: Anner Crete M.D.   On: 07/14/2015 19:08   Dg Abd Acute  W/chest  07/01/2015   CLINICAL DATA:  Patient with left-sided abdominal pain above colostomy bag. Persistent cough for multiple weeks.  EXAM: DG ABDOMEN ACUTE W/ 1V CHEST  COMPARISON:  Chest radiograph 03/09/2015  FINDINGS: Monitoring leads overlie the patient. Stable cardiac and mediastinal contours. No consolidative pulmonary opacities. No pleural effusion or pneumothorax.  Gas is demonstrated within nondilated loops of large and small bowel within the abdomen. The entire left hemi abdomen is not included on this radiograph however there may potentially be a left parastomal hernia. Lumbar spine degenerative changes.  IMPRESSION: The entire left hemi abdomen is not included on current radiograph however there may be a left parastomal hernia. No evidence for small bowel obstruction.  No acute cardiopulmonary process.   Electronically Signed   By: Lovey Newcomer M.D.   On: 07/01/2015 18:48     CBC  Recent Labs Lab 07/14/15 1435 07/15/15 0119  WBC 9.9 6.9  HGB 13.0 11.2*  HCT 38.6 33.7*  PLT 221 169  MCV 95.1 94.1  MCH 32.0 31.3  MCHC 33.7 33.2  RDW 14.2 14.3    Chemistries   Recent Labs Lab 07/14/15 1435 07/14/15 1854 07/15/15 0119  NA 134*  --  134*  K 4.9  --  4.6  CL 108  --  112*  CO2 16*  --  13*  GLUCOSE 70  --  237*  BUN 33*  --  30*  CREATININE 3.37* 3.24* 3.01*  CALCIUM 9.5  --  8.5*  MG  --  2.2  --   AST 51*  --  48*  ALT 37  --  31  ALKPHOS 172*  --  139*  BILITOT 0.4  --  0.3   ------------------------------------------------------------------------------------------------------------------ estimated creatinine clearance is 15.2 mL/min (by C-G formula based on Cr of 3.01). ------------------------------------------------------------------------------------------------------------------ No results for input(s): HGBA1C in the last 72 hours. ------------------------------------------------------------------------------------------------------------------ No results  for input(s): CHOL, HDL, LDLCALC, TRIG, CHOLHDL, LDLDIRECT in the last 72 hours. ------------------------------------------------------------------------------------------------------------------  Recent Labs  07/15/15 0119  TSH 1.706   ------------------------------------------------------------------------------------------------------------------ No results for input(s): VITAMINB12, FOLATE, FERRITIN, TIBC, IRON, RETICCTPCT in the last 72 hours.  Coagulation profile No results for input(s): INR, PROTIME in the last 168 hours.  No results for input(s): DDIMER in the last 72 hours.  Cardiac Enzymes  Recent Labs Lab 07/14/15 1854 07/15/15 0119 07/15/15 0801  TROPONINI 0.04* 0.04* 0.04*   ------------------------------------------------------------------------------------------------------------------ Invalid input(s): POCBNP   CBG:  Recent Labs Lab 07/14/15 1404 07/14/15 2143 07/15/15 0104 07/15/15 0553 07/15/15 0734  GLUCAP 60* 154* 225* 161* 180*       Studies: Ct Abdomen Pelvis Wo Contrast  07/14/2015   CLINICAL DATA:  Abdominal pain with nausea for 3 weeks.  EXAM: CT ABDOMEN AND PELVIS WITHOUT CONTRAST  TECHNIQUE: Multidetector CT imaging of the abdomen and pelvis was performed following the standard protocol without IV contrast.  COMPARISON:  Multiple exams, including 07/01/2015 and 07/11/2014  FINDINGS: Lower chest: Coronary artery atherosclerotic calcification along with calcification of the upper portion of the mitral valve. Faint calcification along the tracheobronchial tree  at the lung bases, likely incidental.  Hepatobiliary: Unremarkable  Pancreas: Severely atrophic pancreas, with little in the way of recognizable pancreatic parenchymal tissue. This is a chronic appearance.  Spleen: Unremarkable  Adrenals/Urinary Tract: Mild bilateral renal atrophy, otherwise normal.  Stomach/Bowel: The ligament of Treitz appears to be lax, with the proximal jejunum extending  caudad into the large peristomal hernia which contains multiple loops of bowel along with accompanying mesentery, in addition to the ostomy site. The colon appears to be entirely intra-abdominal. Anastomotic site at the rectosigmoid junction.  Vascular/Lymphatic: Aortoiliac atherosclerotic vascular disease.  Reproductive: Unremarkable  Other: No supplemental non-categorized findings.  Musculoskeletal: Articular cartilage thinning in both hips. Stable appearance of 70% compression fracture at T12. No change in the low intervertebral disc height at L5-S1.  IMPRESSION: 1. The patient has a large peristomal hernia on the left containing multiple loops of small bowel and mesentery, but this not appreciably changed compared to last year at this time, and there no findings of strangulation or obstruction. The the ligament of Treitz is lax. 2. Other imaging findings of potential clinical significance: Coronary atherosclerosis; calcified mitral valve; chronically atrophic pancreas ; mild bilateral renal atrophy; aortoiliac atherosclerosis; mild degenerative cartilage thinning in both hips; chronic 70% compression fracture at T12.   Electronically Signed   By: Van Clines M.D.   On: 07/14/2015 18:00   Portable Chest 1 View  07/14/2015   CLINICAL DATA:  79 year old female with abdominal pain and nausea  EXAM: PORTABLE CHEST - 1 VIEW  COMPARISON:  Radiograph dated 07/01/2015  FINDINGS: Single-view of the chest demonstrate emphysematous changes of the lungs. No focal consolidation, pleural effusion, or pneumothorax. Stable cardiac silhouette. The osseous structures appear unremarkable.  IMPRESSION: No active disease.   Electronically Signed   By: Anner Crete M.D.   On: 07/14/2015 19:08      Lab Results  Component Value Date   HGBA1C 7.3 06/10/2015   HGBA1C 8.6% 01/16/2015   HGBA1C 8.5 09/03/2014   Lab Results  Component Value Date   LDLCALC 246* 06/10/2015   CREATININE 3.01* 07/15/2015        Scheduled Meds: . amLODipine  10 mg Oral Daily  . aspirin EC  81 mg Oral Daily  . heparin  5,000 Units Subcutaneous 3 times per day  . hydrALAZINE  50 mg Oral 3 times per day  . insulin aspart  0-9 Units Subcutaneous TID WC  . levothyroxine  50 mcg Oral QAC breakfast  . metoCLOPramide (REGLAN) injection  5 mg Intravenous 4 times per day  . metoprolol tartrate  25 mg Oral BID  . pantoprazole (PROTONIX) IV  40 mg Intravenous Q12H  . sodium chloride  3 mL Intravenous Q12H   Continuous Infusions: . sodium chloride 75 mL/hr at 07/15/15 0820    Principal Problem:   Acute on chronic kidney failure Active Problems:   Essential hypertension   Chronic kidney disease   CVA (cerebral infarction), past history   Ileostomy secondary to diverticulitis   Gastroparesis due to DM   Acute renal failure superimposed on stage 4 chronic kidney disease   Malnutrition of moderate degree    Time spent: 45 minutes   [facility] Pager [phone]. If 7PM-7AM, please contact night-coverage at [email or web address], password [email or web address] 07/15/2015, 12:47 PM  LOS: 1 day

## 2015-07-15 NOTE — Consult Note (Signed)
Consultation  Referring Provider:  [facility]    Primary Care Physician:  Redge Gainer, MD Primary Gastroenterologist:   Zenovia Jarred, MD      Reason for Consultation:   nausea           HPI:   [name] is a 79 y.o. female known to [name] for a history of GERD, gastroparesis, diverticulitis with colon resection / ileostomy, and elevated LFTs. She was seen in the office in June with acute nausea / vomiting which we felt was multifactorial. Patient had responded to Reglan in the past, was agreeable to another course. Her LFTs were abnormal but felt to be unrelated. Husband called office yesterday with complaints of ongoing nausea. She was subsequently brought to ED. Admitted with acute on chronic renal failure  WBC normal. LFTs actually improved. After IVFs hgb down to 11.2 (about baseline). Noncontrast CTscan reveals severely atrophic pancreas, large peristomal hernia containing multiple SB loops but no obstruction.    Per husband, nausea progressive over last couple of months despite TID Reglan and also Zofran. No actual vomiting. The nausea is intermittent and is often accompanied by coughing. Patient reports recent 8 pound weight loss. No recent med changes accounting for the progressive nausea. Blood sugars running under 150 at home. Bowels are at baseline.   Past Medical History  Diagnosis Date  . Diverticulitis 2005    Colonoscopy--Dr. Sharlett Iles   . Diabetes mellitus without complication   . Hyperlipidemia   . Hypertension   . Stroke     Past history  . Hepatic steatosis   . Anemia   . Arthritis   . Chronic kidney disease (CKD)     past dialysis  . Status post dilation of esophageal narrowing   . MRSA infection 07/2009  . Proteinuria 09/23/2014  . MGUS (monoclonal gammopathy of unknown significance) 10/10/2014  . Hyperplastic colon polyp   . Fundic gland polyps of stomach, benign   . GERD (gastroesophageal reflux disease)   . Gastroparesis   . CVA (cerebral  vascular accident)     Past Surgical History  Procedure Laterality Date  . Bilateral oophorectomy    . Rotator cuff repair Bilateral   . Carpal tunnel repair Bilateral   . Laparoscopic colostomy      for diverticulitis  . Nasal sinus surgery    . Tonsillectomy    . Replacement total knee Right   . Abdominal hernia repair      with ilestomy  . Partial colectomy      Family History  Problem Relation Age of Onset  . Diabetes Mother   . Heart disease Father 42    Died after bypass  . Diabetes Sister   . Colon cancer Neg Hx      Social History  Substance Use Topics  . Smoking status: Never Smoker   . Smokeless tobacco: Never Used  . Alcohol Use: No    Prior to Admission medications   Medication Sig Start Date End Date Taking? Authorizing Provider  acetaminophen (TYLENOL) 500 MG tablet Take 1,000 mg by mouth every 6 (six) hours as needed for moderate pain.    Yes Historical Provider, MD  amLODipine (NORVASC) 10 MG tablet TAKE 1 TABLET ONCE A DAY 01/20/15  Yes Chipper Herb, MD  aspirin EC 81 MG tablet Take 81 mg by mouth daily.   Yes Historical Provider, MD  B Complex-C-Folic Acid (MULTIVITAMIN, STRESS FORMULA) tablet Take 1 tablet by mouth daily.  Yes Historical Provider, MD  diazepam (VALIUM) 2 MG tablet TAKE 1 TABLET 1 HOUR BEFORE DENTAL APPOINTMENT 06/30/15  Yes Chipper Herb, MD  glucagon [facility]) 1 MG injection Inject into the muscle as needed for severe low BG 06/16/15  Yes Tammy Eckard, PHARMD  guaiFENesin (MUCINEX) 600 MG 12 hr tablet Take 1,200 mg by mouth 2 (two) times daily as needed for cough or to loosen phlegm.    Yes Historical Provider, MD  insulin lispro (HUMALOG KWIKPEN) 100 UNIT/ML KiwkPen Inject 5 to 12 units subcutaneously prior to lunch based on blood glucose reading. 06/16/15  Yes Tammy Eckard, PHARMD  insulin lispro protamine-lispro (HUMALOG MIX 50/50) (50-50) 100 UNIT/ML SUSP injection Inject 45-70 Units into the skin 2 (two) times daily  before a meal. Take 70 units every morning with breakfast and 45 units every evening with supper   Yes Historical Provider, MD  Insulin Pen Needle (PEN NEEDLES 31GX5/16") 31G X 8 MM MISC USE AS DIRECTED TWICE A DAY Patient taking differently: 1 each by Other route 3 (three) times daily. USE AS DIRECTED TWICE A DAY 05/27/15  Yes Chipper Herb, MD  Lancets [facility] ULTRASOFT) lancets CHECK BLOOD SUGAR 4 TIMES A DAY 04/07/15  Yes Chipper Herb, MD  levothyroxine (SYNTHROID, LEVOTHROID) 50 MCG tablet TAKE 1 TABLET DAILY 09/19/14  Yes Chipper Herb, MD  metoCLOPramide (REGLAN) 5 MG tablet Take 1 tab by mouth tid prior to breakfast, lunch and dinner / supper 06/16/15  Yes Tammy Eckard, PHARMD  metoprolol tartrate (LOPRESSOR) 25 MG tablet TAKE 1 & 1/2 TABLETS TWICE A DAY 04/01/15  Yes Maitlyn-Margaret Hassell Done, FNP  mupirocin ointment (BACTROBAN) 2 % APPLY TO AFFECTED AREA AS DIRECTED 10/08/14  Yes Chipper Herb, MD  omega-3 acid ethyl esters (LOVAZA) 1 G capsule TAKE (2) CAPSULES TWICE DAILY. 09/06/14  Yes Chipper Herb, MD  ondansetron (ZOFRAN) 8 MG tablet TAKE 1/2 TO 1 TABLET THREE TIMES DAILY AS NEEDED. FIRST DOSE EARLY BEFORE BREAKFAST 07/04/15  Yes Jerene Bears, MD  ONE TOUCH ULTRA TEST test strip Test blood sugar qid prn. DX E11.8 07/11/15  Yes Chipper Herb, MD  pantoprazole (PROTONIX) 40 MG tablet TAKE (1) TABLET TWICE A DAY. 04/29/15  Yes Chipper Herb, MD  Polyethyl Glycol-Propyl Glycol 0.4-0.3 % SOLN Apply 1 drop to eye 3 (three) times daily as needed (dry eyes).   Yes Historical Provider, MD  cephALEXin (KEFLEX) 500 MG capsule Take 1 capsule (500 mg total) by mouth 2 (two) times daily. Patient not taking: Reported on 07/14/2015 07/01/15   Sherwood Gambler, MD  nitrofurantoin (MACRODANTIN) 100 MG capsule Take 1 capsule (100 mg total) by mouth 2 (two) times daily. Patient not taking: Reported on 07/14/2015 07/02/15   Chipper Herb, MD  ondansetron [facility] ODT) 8 MG disintegrating tablet Take 0.5-1 tablets  (4-8 mg total) by mouth 3 (three) times daily as needed for nausea or vomiting. Patient not taking: Reported on 07/14/2015 04/17/15   Jerene Bears, MD  traMADol (ULTRAM) 50 MG tablet Take 1 tablet (50 mg total) by mouth every 6 (six) hours as needed. Patient not taking: Reported on 06/16/2015 11/06/13   Sable Feil, MD    Current Facility-Administered Medications  Medication Dose Route Frequency Provider Last Rate Last Dose  . 0.9 %  sodium chloride infusion   Intravenous Continuous Reyne Dumas, MD 75 mL/hr at 07/15/15 0820    . albuterol (PROVENTIL) (2.5 MG/3ML) 0.083% nebulizer solution 2.5 mg  2.5  mg Nebulization Q6H PRN Reyne Dumas, MD      . amLODipine (NORVASC) tablet 10 mg  10 mg Oral Daily Reyne Dumas, MD   10 mg at 07/15/15 0813  . aspirin EC tablet 81 mg  81 mg Oral Daily Reyne Dumas, MD   81 mg at 07/15/15 0957  . guaiFENesin (MUCINEX) 12 hr tablet 1,200 mg  1,200 mg Oral BID PRN Reyne Dumas, MD      . heparin injection 5,000 Units  5,000 Units Subcutaneous 3 times per day Reyne Dumas, MD   5,000 Units at 07/15/15 0555  . hydrALAZINE (APRESOLINE) injection 10 mg  10 mg Intravenous Q6H PRN Reyne Dumas, MD   10 mg at 07/15/15 0619  . hydrALAZINE (APRESOLINE) tablet 50 mg  50 mg Oral 3 times per day Reyne Dumas, MD   50 mg at 07/15/15 0957  . insulin aspart (novoLOG) injection 0-9 Units  0-9 Units Subcutaneous TID WC Reyne Dumas, MD   2 Units at 07/15/15 0958  . levothyroxine (SYNTHROID, LEVOTHROID) tablet 50 mcg  50 mcg Oral QAC breakfast Reyne Dumas, MD   50 mcg at 07/15/15 0813  . metoCLOPramide (REGLAN) injection 5 mg  5 mg Intravenous 4 times per day Reyne Dumas, MD   5 mg at 07/15/15 0554  . metoprolol tartrate (LOPRESSOR) tablet 25 mg  25 mg Oral BID Reyne Dumas, MD   25 mg at 07/15/15 0813  . pantoprazole (PROTONIX) injection 40 mg  40 mg Intravenous Q12H Reyne Dumas, MD   40 mg at 07/15/15 0813  . sodium chloride 0.9 % injection 3 mL  3 mL Intravenous Q12H Reyne Dumas, MD   3 mL at 07/14/15 2200    Allergies as of 07/14/2015 - Review Complete 07/14/2015  Allergen Reaction Noted  . Ace inhibitors Other (See Comments) 02/25/2011  . Actos [pioglitazone hydrochloride] Other (See Comments) 02/25/2011  . Aspirin    . Codeine    . Hydromorphone hcl Nausea And Vomiting   . Metformin and related Nausea And Vomiting   . Penicillins    . Procaine hcl Other (See Comments)   . Statins Other (See Comments) 02/25/2011  . Sulfa antibiotics  02/25/2011     Review of Systems:    As per HPI, otherwise negative    Physical Exam:  Vital signs in last 24 hours: Temp:  [98.1 F (36.7 C)-98.3 F (36.8 C)] 98.1 F (36.7 C) (09/13 0559) Pulse Rate:  [54-66] 66 (09/13 0559) Resp:  [16-23] 18 (09/13 0559) BP: (168-192)/(46-101) 175/48 mmHg (09/13 0723) SpO2:  [94 %-100 %] 94 % (09/13 0559) Weight:  [180 lb 3.2 oz (81.738 kg)] 180 lb 3.2 oz (81.738 kg) (09/12 2048) Last BM Date: 07/14/15 General:   Pleasant obese white female in NAD Head:  Normocephalic and atraumatic. Eyes:   No icterus.   Conjunctiva pink. Ears:  Normal auditory acuity. Neck:  Supple Lungs:  Respirations even and unlabored. Lungs clear to auscultation bilaterally.   No wheezes, crackles, or rhonchi.  Heart:  Regular rate and rhythm Abdomen:  Soft, obese, nondistended, nontender, peristomal hernia. Normal bowel sounds.  Rectal:  Not performed.  Msk:  Symmetrical without gross deformities.  Extremities:  Without edema. Neurologic:  Alert and  oriented x4;  grossly normal neurologically. Skin:  Intact without significant lesions or rashes. Psych:  Alert and cooperative. Normal affect.  LAB RESULTS:  Recent Labs  07/14/15 1435 07/15/15 0119  WBC 9.9 6.9  HGB 13.0 11.2*  HCT 38.6 33.7*  PLT 221 169   BMET  Recent Labs  07/14/15 1435 07/14/15 1854 07/15/15 0119  NA 134*  --  134*  K 4.9  --  4.6  CL 108  --  112*  CO2 16*  --  13*  GLUCOSE 70  --  237*  BUN 33*  --   30*  CREATININE 3.37* 3.24* 3.01*  CALCIUM 9.5  --  8.5*   LFT  Recent Labs  07/15/15 0119  PROT 6.2*  ALBUMIN 3.0*  AST 48*  ALT 31  ALKPHOS 139*  BILITOT 0.3   STUDIES: Ct Abdomen Pelvis Wo Contrast  07/14/2015   CLINICAL DATA:  Abdominal pain with nausea for 3 weeks.  EXAM: CT ABDOMEN AND PELVIS WITHOUT CONTRAST  TECHNIQUE: Multidetector CT imaging of the abdomen and pelvis was performed following the standard protocol without IV contrast.  COMPARISON:  Multiple exams, including 07/01/2015 and 07/11/2014  FINDINGS: Lower chest: Coronary artery atherosclerotic calcification along with calcification of the upper portion of the mitral valve. Faint calcification along the tracheobronchial tree at the lung bases, likely incidental.  Hepatobiliary: Unremarkable  Pancreas: Severely atrophic pancreas, with little in the way of recognizable pancreatic parenchymal tissue. This is a chronic appearance.  Spleen: Unremarkable  Adrenals/Urinary Tract: Mild bilateral renal atrophy, otherwise normal.  Stomach/Bowel: The ligament of Treitz appears to be lax, with the proximal jejunum extending caudad into the large peristomal hernia which contains multiple loops of bowel along with accompanying mesentery, in addition to the ostomy site. The colon appears to be entirely intra-abdominal. Anastomotic site at the rectosigmoid junction.  Vascular/Lymphatic: Aortoiliac atherosclerotic vascular disease.  Reproductive: Unremarkable  Other: No supplemental non-categorized findings.  Musculoskeletal: Articular cartilage thinning in both hips. Stable appearance of 70% compression fracture at T12. No change in the low intervertebral disc height at L5-S1.  IMPRESSION: 1. The patient has a large peristomal hernia on the left containing multiple loops of small bowel and mesentery, but this not appreciably changed compared to last year at this time, and there no findings of strangulation or obstruction. The the ligament of  Treitz is lax. 2. Other imaging findings of potential clinical significance: Coronary atherosclerosis; calcified mitral valve; chronically atrophic pancreas ; mild bilateral renal atrophy; aortoiliac atherosclerosis; mild degenerative cartilage thinning in both hips; chronic 70% compression fracture at T12.   Electronically Signed   By: Van Clines M.D.   On: 07/14/2015 18:00    PREVIOUS ENDOSCOPIES:            Most recent EGD November 2015 ENDOSCOPIC IMPRESSION: 1. The mucosa of the esophagus appeared normal 2. Multiple sessile polyps measuring 2 mm in size were found in the gastric body, gastric fundus, and cardia; multiple sampling biopsies were performed.   Path = hyperplastic and fundic gland polyps. 3. The duodenal mucosa showed no abnormalities in the bulb and 2nd part of the duodenum    RECOMMENDATIONS: 1. Await biopsy results 2. Continue current medications    Impression / Plan:   58. 79 year old female with progressive nausea / mild weight loss. She has known gastroparesis but on Reglan TID at home. Etiology of progressive nausea uncertain. Looks like she was recently taking Macrodantin which can cause nausea but nausea preceded the antibiotic and she isn't taking it now so not likely related. She had multiple gastric polyps on EGD last year. It isn't unreasonable to repeat EGD for reassessment of these polyps so we will start with that. Patient and husband agree with this plan.  Will give her clears today, NPO after midnight for EGD tomorrow. If EGD negative may consider erythromycin for promotility properties  For now, continue Reglan, I don't think we can increase dose because of renal status. Will stop SQ Heparin after tonight in preparation for EGD tomorrow.  2. Multiple medical problems. In addition, patient in process of workup for MGUS.  Thanks   LOS: 1 day   Tye Savoy  07/15/2015, 10:48 AM   Attending Addendum: I have taken an interval history, reviewed the  chart, and examined the patient. I agree with the Advanced Practitioner's note and impression. 79 y/o female with gastroparesis presenting with severe nausea, poor appetite, and weight loss. No significant vomiting but she does have some wretching. Nausea both postprandial and sporadic. Reglan not providing benefit. Unclear if this is due to gastroparesis or other pathology. Recommend trial of IV erythromycin while inpatient to see if this helps and proceed with EGD tomorrow to ensure no interval changes since her last exam given the development of these symptoms. If EGD negative will pursue other etiologies. May consider empiric vitamin B6 supplement which can help some patients with chronic nausea. The indications, risks, and benefits of EGD were explained to the patient in detail. Risks include but not limited to bleeding, perforation, adverse reaction to medications, cardiopulmonary compromise. Patient verbalized understanding and wished to proceed.    Cellar, MD [facility] Gastroenterology Pager [phone]

## 2015-07-16 ENCOUNTER — Inpatient Hospital Stay (HOSPITAL_COMMUNITY): Payer: Medicare Other

## 2015-07-16 ENCOUNTER — Encounter (HOSPITAL_COMMUNITY): Payer: Self-pay | Admitting: *Deleted

## 2015-07-16 ENCOUNTER — Encounter (HOSPITAL_COMMUNITY)
Admission: EM | Disposition: A | Discharge status: Skilled Nursing Facility | Payer: Self-pay | Source: Home / Self Care | Attending: Internal Medicine

## 2015-07-16 DIAGNOSIS — K317 Polyp of stomach and duodenum: Secondary | ICD-10-CM

## 2015-07-16 DIAGNOSIS — R112 Nausea with vomiting, unspecified: Secondary | ICD-10-CM | POA: Insufficient documentation

## 2015-07-16 DIAGNOSIS — R1110 Vomiting, unspecified: Secondary | ICD-10-CM

## 2015-07-16 DIAGNOSIS — R0600 Dyspnea, unspecified: Secondary | ICD-10-CM

## 2015-07-16 HISTORY — PX: ESOPHAGOGASTRODUODENOSCOPY: SHX5428

## 2015-07-16 LAB — COMPREHENSIVE METABOLIC PANEL
ALK PHOS: 119 U/L (ref 38–126)
ALT: 27 U/L (ref 14–54)
AST: 45 U/L — AB (ref 15–41)
Albumin: 2.8 g/dL — ABNORMAL LOW (ref 3.5–5.0)
Anion gap: 7 (ref 5–15)
BILIRUBIN TOTAL: 0.7 mg/dL (ref 0.3–1.2)
BUN: 23 mg/dL — AB (ref 6–20)
CALCIUM: 8.6 mg/dL — AB (ref 8.9–10.3)
CO2: 15 mmol/L — ABNORMAL LOW (ref 22–32)
CREATININE: 2.48 mg/dL — AB (ref 0.44–1.00)
Chloride: 118 mmol/L — ABNORMAL HIGH (ref 101–111)
GFR calc Af Amer: 20 mL/min — ABNORMAL LOW (ref >60)
GFR, EST NON AFRICAN AMERICAN: 18 mL/min — AB (ref >60)
GLUCOSE: 154 mg/dL — AB (ref 65–99)
POTASSIUM: 4.4 mmol/L (ref 3.5–5.1)
Sodium: 140 mmol/L (ref 135–145)
TOTAL PROTEIN: 5.7 g/dL — AB (ref 6.5–8.1)

## 2015-07-16 LAB — GLUCOSE, CAPILLARY
GLUCOSE-CAPILLARY: 128 mg/dL — AB (ref 65–99)
GLUCOSE-CAPILLARY: 177 mg/dL — AB (ref 65–99)
GLUCOSE-CAPILLARY: 166 mg/dL — AB (ref 65–99)
Glucose-Capillary: 132 mg/dL — ABNORMAL HIGH (ref 65–99)
Glucose-Capillary: 162 mg/dL — ABNORMAL HIGH (ref 65–99)
Glucose-Capillary: 174 mg/dL — ABNORMAL HIGH (ref 65–99)

## 2015-07-16 LAB — HEMOGLOBIN A1C
Hgb A1c MFr Bld: 6.9 % — ABNORMAL HIGH (ref 4.8–5.6)
MEAN PLASMA GLUCOSE: 151 mg/dL

## 2015-07-16 SURGERY — EGD (ESOPHAGOGASTRODUODENOSCOPY)
Anesthesia: Moderate Sedation

## 2015-07-16 MED ORDER — FENTANYL CITRATE (PF) 100 MCG/2ML IJ SOLN
INTRAMUSCULAR | Status: AC
  Filled 2015-07-16: qty 2

## 2015-07-16 MED ORDER — DIPHENHYDRAMINE HCL 50 MG/ML IJ SOLN
INTRAMUSCULAR | Status: AC
  Filled 2015-07-16: qty 1

## 2015-07-16 MED ORDER — MIDAZOLAM HCL 10 MG/2ML IJ SOLN
INTRAMUSCULAR | Status: DC | PRN
  Administered 2015-07-16 (×2): 2 mg via INTRAVENOUS
  Administered 2015-07-16 (×2): 1 mg via INTRAVENOUS

## 2015-07-16 MED ORDER — MIDAZOLAM HCL 5 MG/ML IJ SOLN
INTRAMUSCULAR | Status: AC
  Filled 2015-07-16: qty 2

## 2015-07-16 MED ORDER — FENTANYL CITRATE (PF) 100 MCG/2ML IJ SOLN
INTRAMUSCULAR | Status: DC | PRN
  Administered 2015-07-16 (×2): 25 ug via INTRAVENOUS

## 2015-07-16 MED ORDER — LORAZEPAM 0.5 MG PO TABS
0.2500 mg | ORAL_TABLET | Freq: Once | ORAL | Status: AC | PRN
  Administered 2015-07-16: 0.25 mg via ORAL
  Filled 2015-07-16: qty 1

## 2015-07-16 NOTE — Op Note (Signed)
[facility] [place], [address]   ENDOSCOPY PROCEDURE REPORT  PATIENT: [name], [name]  MR#: [id number] BIRTHDATE: 1936-08-18 , 79  yrs. old GENDER: female ENDOSCOPIST: Merrill Cellar, MD REFERRED BY: PROCEDURE DATE:  07/16/2015 PROCEDURE:  EGD w/ snare technique and EGD w/ biopsy ASA CLASS:     Class III INDICATIONS:  nausea. MEDICATIONS: Midazolam (Versed) 6 mg IV and Fentanyl 50 mcg IV TOPICAL ANESTHETIC: Cetacaine Spray  DESCRIPTION OF PROCEDURE: After the risks benefits and alternatives of the procedure were thoroughly explained, informed consent was obtained.  The Pentax Gastroscope [id number] endoscope was introduced through the mouth and advanced to the second portion of the duodenum , Without limitations.  The instrument was slowly withdrawn as the mucosa was fully examined.    FINDINGS: The esophagus was normal in appearance.  DH, SCJ, and GEJ located 36cm from the incisors.  There were too many to count small gastric polyps in the gastric body and fundus, all < 1 cm in size. Some of the polyps were erythematous.  A polyp at the cardia, just inferior to the GEJ was very erythematous and inflamed.  This polyp and the other largest polyp were removed via hot snare and retrieved.  Biopsies were taken to rule out H pylori.  The duodenal bulb was normal.  The ampulla was noted and mucosa appeared slightly irregular and was biopsied to ensure normal.  No evidence of gastric or small bowel outet obstruction.  Retroflexed views revealed as previously described.     The scope was then withdrawn from the patient and the procedure completed.  COMPLICATIONS: There were no immediate complications.  ENDOSCOPIC IMPRESSION: Normal esophagus Too many to count gastric polyps, all < 1cm in size. A few of the largest polyps noted were removed via hot snare. Inflamed polyp in the gastric cardia removed. No evidence of GOO or PUD - biopsies taken to  rule out H pylori. Normal duodenal bulb Slightly irregular ampulla, biopsies obtained to ensure normal.   RECOMMENDATIONS: Return to ward Resume medications Resume IV erythromycin Small, more frequent meals for gastroparesis Recommend brain imaging (CT or MRI) given the patient's headaches and severe nausea, ensure no CNS pathology Empiric trial of vitamin B daily Await pathology results GI service will continue to follow    eSigned:  Terlton Cellar, MD 07/16/2015 9:28 AM    CC:  PATIENT NAME:  Alean, Kromer MR#: [id number]

## 2015-07-16 NOTE — H&P (View-Only) (Signed)
Consultation  Referring Provider:  [facility]    Primary Care Physician:  Redge Gainer, MD Primary Gastroenterologist:   Zenovia Jarred, MD      Reason for Consultation:   nausea           HPI:   [name] is a 79 y.o. female known to [name] for a history of GERD, gastroparesis, diverticulitis with colon resection / ileostomy, and elevated LFTs. She was seen in the office in June with acute nausea / vomiting which we felt was multifactorial. Patient had responded to Reglan in the past, was agreeable to another course. Her LFTs were abnormal but felt to be unrelated. Husband called office yesterday with complaints of ongoing nausea. She was subsequently brought to ED. Admitted with acute on chronic renal failure  WBC normal. LFTs actually improved. After IVFs hgb down to 11.2 (about baseline). Noncontrast CTscan reveals severely atrophic pancreas, large peristomal hernia containing multiple SB loops but no obstruction.    Per husband, nausea progressive over last couple of months despite TID Reglan and also Zofran. No actual vomiting. The nausea is intermittent and is often accompanied by coughing. Patient reports recent 8 pound weight loss. No recent med changes accounting for the progressive nausea. Blood sugars running under 150 at home. Bowels are at baseline.   Past Medical History  Diagnosis Date  . Diverticulitis 2005    Colonoscopy--Dr. Sharlett Iles   . Diabetes mellitus without complication   . Hyperlipidemia   . Hypertension   . Stroke     Past history  . Hepatic steatosis   . Anemia   . Arthritis   . Chronic kidney disease (CKD)     past dialysis  . Status post dilation of esophageal narrowing   . MRSA infection 07/2009  . Proteinuria 09/23/2014  . MGUS (monoclonal gammopathy of unknown significance) 10/10/2014  . Hyperplastic colon polyp   . Fundic gland polyps of stomach, benign   . GERD (gastroesophageal reflux disease)   . Gastroparesis   . CVA (cerebral  vascular accident)     Past Surgical History  Procedure Laterality Date  . Bilateral oophorectomy    . Rotator cuff repair Bilateral   . Carpal tunnel repair Bilateral   . Laparoscopic colostomy      for diverticulitis  . Nasal sinus surgery    . Tonsillectomy    . Replacement total knee Right   . Abdominal hernia repair      with ilestomy  . Partial colectomy      Family History  Problem Relation Age of Onset  . Diabetes Mother   . Heart disease Father 27    Died after bypass  . Diabetes Sister   . Colon cancer Neg Hx      Social History  Substance Use Topics  . Smoking status: Never Smoker   . Smokeless tobacco: Never Used  . Alcohol Use: No    Prior to Admission medications   Medication Sig Start Date End Date Taking? Authorizing Provider  acetaminophen (TYLENOL) 500 MG tablet Take 1,000 mg by mouth every 6 (six) hours as needed for moderate pain.    Yes Historical Provider, MD  amLODipine (NORVASC) 10 MG tablet TAKE 1 TABLET ONCE A DAY 01/20/15  Yes Chipper Herb, MD  aspirin EC 81 MG tablet Take 81 mg by mouth daily.   Yes Historical Provider, MD  B Complex-C-Folic Acid (MULTIVITAMIN, STRESS FORMULA) tablet Take 1 tablet by mouth daily.  Yes Historical Provider, MD  diazepam (VALIUM) 2 MG tablet TAKE 1 TABLET 1 HOUR BEFORE DENTAL APPOINTMENT 06/30/15  Yes Chipper Herb, MD  glucagon [facility]) 1 MG injection Inject into the muscle as needed for severe low BG 06/16/15  Yes Tammy Eckard, PHARMD  guaiFENesin (MUCINEX) 600 MG 12 hr tablet Take 1,200 mg by mouth 2 (two) times daily as needed for cough or to loosen phlegm.    Yes Historical Provider, MD  insulin lispro (HUMALOG KWIKPEN) 100 UNIT/ML KiwkPen Inject 5 to 12 units subcutaneously prior to lunch based on blood glucose reading. 06/16/15  Yes Tammy Eckard, PHARMD  insulin lispro protamine-lispro (HUMALOG MIX 50/50) (50-50) 100 UNIT/ML SUSP injection Inject 45-70 Units into the skin 2 (two) times daily  before a meal. Take 70 units every morning with breakfast and 45 units every evening with supper   Yes Historical Provider, MD  Insulin Pen Needle (PEN NEEDLES 31GX5/16") 31G X 8 MM MISC USE AS DIRECTED TWICE A DAY Patient taking differently: 1 each by Other route 3 (three) times daily. USE AS DIRECTED TWICE A DAY 05/27/15  Yes Chipper Herb, MD  Lancets [facility] ULTRASOFT) lancets CHECK BLOOD SUGAR 4 TIMES A DAY 04/07/15  Yes Chipper Herb, MD  levothyroxine (SYNTHROID, LEVOTHROID) 50 MCG tablet TAKE 1 TABLET DAILY 09/19/14  Yes Chipper Herb, MD  metoCLOPramide (REGLAN) 5 MG tablet Take 1 tab by mouth tid prior to breakfast, lunch and dinner / supper 06/16/15  Yes Tammy Eckard, PHARMD  metoprolol tartrate (LOPRESSOR) 25 MG tablet TAKE 1 & 1/2 TABLETS TWICE A DAY 04/01/15  Yes Junella-Margaret Hassell Done, FNP  mupirocin ointment (BACTROBAN) 2 % APPLY TO AFFECTED AREA AS DIRECTED 10/08/14  Yes Chipper Herb, MD  omega-3 acid ethyl esters (LOVAZA) 1 G capsule TAKE (2) CAPSULES TWICE DAILY. 09/06/14  Yes Chipper Herb, MD  ondansetron (ZOFRAN) 8 MG tablet TAKE 1/2 TO 1 TABLET THREE TIMES DAILY AS NEEDED. FIRST DOSE EARLY BEFORE BREAKFAST 07/04/15  Yes Jerene Bears, MD  ONE TOUCH ULTRA TEST test strip Test blood sugar qid prn. DX E11.8 07/11/15  Yes Chipper Herb, MD  pantoprazole (PROTONIX) 40 MG tablet TAKE (1) TABLET TWICE A DAY. 04/29/15  Yes Chipper Herb, MD  Polyethyl Glycol-Propyl Glycol 0.4-0.3 % SOLN Apply 1 drop to eye 3 (three) times daily as needed (dry eyes).   Yes Historical Provider, MD  cephALEXin (KEFLEX) 500 MG capsule Take 1 capsule (500 mg total) by mouth 2 (two) times daily. Patient not taking: Reported on 07/14/2015 07/01/15   Sherwood Gambler, MD  nitrofurantoin (MACRODANTIN) 100 MG capsule Take 1 capsule (100 mg total) by mouth 2 (two) times daily. Patient not taking: Reported on 07/14/2015 07/02/15   Chipper Herb, MD  ondansetron [facility] ODT) 8 MG disintegrating tablet Take 0.5-1 tablets  (4-8 mg total) by mouth 3 (three) times daily as needed for nausea or vomiting. Patient not taking: Reported on 07/14/2015 04/17/15   Jerene Bears, MD  traMADol (ULTRAM) 50 MG tablet Take 1 tablet (50 mg total) by mouth every 6 (six) hours as needed. Patient not taking: Reported on 06/16/2015 11/06/13   Sable Feil, MD    Current Facility-Administered Medications  Medication Dose Route Frequency Provider Last Rate Last Dose  . 0.9 %  sodium chloride infusion   Intravenous Continuous Reyne Dumas, MD 75 mL/hr at 07/15/15 0820    . albuterol (PROVENTIL) (2.5 MG/3ML) 0.083% nebulizer solution 2.5 mg  2.5  mg Nebulization Q6H PRN Reyne Dumas, MD      . amLODipine (NORVASC) tablet 10 mg  10 mg Oral Daily Reyne Dumas, MD   10 mg at 07/15/15 0813  . aspirin EC tablet 81 mg  81 mg Oral Daily Reyne Dumas, MD   81 mg at 07/15/15 0957  . guaiFENesin (MUCINEX) 12 hr tablet 1,200 mg  1,200 mg Oral BID PRN Reyne Dumas, MD      . heparin injection 5,000 Units  5,000 Units Subcutaneous 3 times per day Reyne Dumas, MD   5,000 Units at 07/15/15 0555  . hydrALAZINE (APRESOLINE) injection 10 mg  10 mg Intravenous Q6H PRN Reyne Dumas, MD   10 mg at 07/15/15 0619  . hydrALAZINE (APRESOLINE) tablet 50 mg  50 mg Oral 3 times per day Reyne Dumas, MD   50 mg at 07/15/15 0957  . insulin aspart (novoLOG) injection 0-9 Units  0-9 Units Subcutaneous TID WC Reyne Dumas, MD   2 Units at 07/15/15 0958  . levothyroxine (SYNTHROID, LEVOTHROID) tablet 50 mcg  50 mcg Oral QAC breakfast Reyne Dumas, MD   50 mcg at 07/15/15 0813  . metoCLOPramide (REGLAN) injection 5 mg  5 mg Intravenous 4 times per day Reyne Dumas, MD   5 mg at 07/15/15 0554  . metoprolol tartrate (LOPRESSOR) tablet 25 mg  25 mg Oral BID Reyne Dumas, MD   25 mg at 07/15/15 0813  . pantoprazole (PROTONIX) injection 40 mg  40 mg Intravenous Q12H Reyne Dumas, MD   40 mg at 07/15/15 0813  . sodium chloride 0.9 % injection 3 mL  3 mL Intravenous Q12H Reyne Dumas, MD   3 mL at 07/14/15 2200    Allergies as of 07/14/2015 - Review Complete 07/14/2015  Allergen Reaction Noted  . Ace inhibitors Other (See Comments) 02/25/2011  . Actos [pioglitazone hydrochloride] Other (See Comments) 02/25/2011  . Aspirin    . Codeine    . Hydromorphone hcl Nausea And Vomiting   . Metformin and related Nausea And Vomiting   . Penicillins    . Procaine hcl Other (See Comments)   . Statins Other (See Comments) 02/25/2011  . Sulfa antibiotics  02/25/2011     Review of Systems:    As per HPI, otherwise negative    Physical Exam:  Vital signs in last 24 hours: Temp:  [98.1 F (36.7 C)-98.3 F (36.8 C)] 98.1 F (36.7 C) (09/13 0559) Pulse Rate:  [54-66] 66 (09/13 0559) Resp:  [16-23] 18 (09/13 0559) BP: (168-192)/(46-101) 175/48 mmHg (09/13 0723) SpO2:  [94 %-100 %] 94 % (09/13 0559) Weight:  [180 lb 3.2 oz (81.738 kg)] 180 lb 3.2 oz (81.738 kg) (09/12 2048) Last BM Date: 07/14/15 General:   Pleasant obese white female in NAD Head:  Normocephalic and atraumatic. Eyes:   No icterus.   Conjunctiva pink. Ears:  Normal auditory acuity. Neck:  Supple Lungs:  Respirations even and unlabored. Lungs clear to auscultation bilaterally.   No wheezes, crackles, or rhonchi.  Heart:  Regular rate and rhythm Abdomen:  Soft, obese, nondistended, nontender, peristomal hernia. Normal bowel sounds.  Rectal:  Not performed.  Msk:  Symmetrical without gross deformities.  Extremities:  Without edema. Neurologic:  Alert and  oriented x4;  grossly normal neurologically. Skin:  Intact without significant lesions or rashes. Psych:  Alert and cooperative. Normal affect.  LAB RESULTS:  Recent Labs  07/14/15 1435 07/15/15 0119  WBC 9.9 6.9  HGB 13.0 11.2*  HCT 38.6 33.7*  PLT 221 169   BMET  Recent Labs  07/14/15 1435 07/14/15 1854 07/15/15 0119  NA 134*  --  134*  K 4.9  --  4.6  CL 108  --  112*  CO2 16*  --  13*  GLUCOSE 70  --  237*  BUN 33*  --   30*  CREATININE 3.37* 3.24* 3.01*  CALCIUM 9.5  --  8.5*   LFT  Recent Labs  07/15/15 0119  PROT 6.2*  ALBUMIN 3.0*  AST 48*  ALT 31  ALKPHOS 139*  BILITOT 0.3   STUDIES: Ct Abdomen Pelvis Wo Contrast  07/14/2015   CLINICAL DATA:  Abdominal pain with nausea for 3 weeks.  EXAM: CT ABDOMEN AND PELVIS WITHOUT CONTRAST  TECHNIQUE: Multidetector CT imaging of the abdomen and pelvis was performed following the standard protocol without IV contrast.  COMPARISON:  Multiple exams, including 07/01/2015 and 07/11/2014  FINDINGS: Lower chest: Coronary artery atherosclerotic calcification along with calcification of the upper portion of the mitral valve. Faint calcification along the tracheobronchial tree at the lung bases, likely incidental.  Hepatobiliary: Unremarkable  Pancreas: Severely atrophic pancreas, with little in the way of recognizable pancreatic parenchymal tissue. This is a chronic appearance.  Spleen: Unremarkable  Adrenals/Urinary Tract: Mild bilateral renal atrophy, otherwise normal.  Stomach/Bowel: The ligament of Treitz appears to be lax, with the proximal jejunum extending caudad into the large peristomal hernia which contains multiple loops of bowel along with accompanying mesentery, in addition to the ostomy site. The colon appears to be entirely intra-abdominal. Anastomotic site at the rectosigmoid junction.  Vascular/Lymphatic: Aortoiliac atherosclerotic vascular disease.  Reproductive: Unremarkable  Other: No supplemental non-categorized findings.  Musculoskeletal: Articular cartilage thinning in both hips. Stable appearance of 70% compression fracture at T12. No change in the low intervertebral disc height at L5-S1.  IMPRESSION: 1. The patient has a large peristomal hernia on the left containing multiple loops of small bowel and mesentery, but this not appreciably changed compared to last year at this time, and there no findings of strangulation or obstruction. The the ligament of  Treitz is lax. 2. Other imaging findings of potential clinical significance: Coronary atherosclerosis; calcified mitral valve; chronically atrophic pancreas ; mild bilateral renal atrophy; aortoiliac atherosclerosis; mild degenerative cartilage thinning in both hips; chronic 70% compression fracture at T12.   Electronically Signed   By: Van Clines M.D.   On: 07/14/2015 18:00    PREVIOUS ENDOSCOPIES:            Most recent EGD November 2015 ENDOSCOPIC IMPRESSION: 1. The mucosa of the esophagus appeared normal 2. Multiple sessile polyps measuring 2 mm in size were found in the gastric body, gastric fundus, and cardia; multiple sampling biopsies were performed.   Path = hyperplastic and fundic gland polyps. 3. The duodenal mucosa showed no abnormalities in the bulb and 2nd part of the duodenum    RECOMMENDATIONS: 1. Await biopsy results 2. Continue current medications    Impression / Plan:   62. 78 year old female with progressive nausea / mild weight loss. She has known gastroparesis but on Reglan TID at home. Etiology of progressive nausea uncertain. Looks like she was recently taking Macrodantin which can cause nausea but nausea preceded the antibiotic and she isn't taking it now so not likely related. She had multiple gastric polyps on EGD last year. It isn't unreasonable to repeat EGD for reassessment of these polyps so we will start with that. Patient and husband agree with this plan.  Will give her clears today, NPO after midnight for EGD tomorrow. If EGD negative may consider erythromycin for promotility properties  For now, continue Reglan, I don't think we can increase dose because of renal status. Will stop SQ Heparin after tonight in preparation for EGD tomorrow.  2. Multiple medical problems. In addition, patient in process of workup for MGUS.  Thanks   LOS: 1 day   Tye Savoy  07/15/2015, 10:48 AM   Attending Addendum: I have taken an interval history, reviewed the  chart, and examined the patient. I agree with the Advanced Practitioner's note and impression. 79 y/o female with gastroparesis presenting with severe nausea, poor appetite, and weight loss. No significant vomiting but she does have some wretching. Nausea both postprandial and sporadic. Reglan not providing benefit. Unclear if this is due to gastroparesis or other pathology. Recommend trial of IV erythromycin while inpatient to see if this helps and proceed with EGD tomorrow to ensure no interval changes since her last exam given the development of these symptoms. If EGD negative will pursue other etiologies. May consider empiric vitamin B6 supplement which can help some patients with chronic nausea. The indications, risks, and benefits of EGD were explained to the patient in detail. Risks include but not limited to bleeding, perforation, adverse reaction to medications, cardiopulmonary compromise. Patient verbalized understanding and wished to proceed.   Bensville Cellar, MD [facility] Pager [phone]

## 2015-07-16 NOTE — Progress Notes (Signed)
  Echocardiogram 2D Echocardiogram has been performed.  [name] 07/16/2015, 4:00 PM

## 2015-07-16 NOTE — Care Management Important Message (Signed)
Important Message  Patient Details  Name: [name] MRN: [id number] Date of Birth: [birth year]   Medicare Important Message Given:  Yes-second notification given    Camillo Flaming 07/16/2015, 11:38 AMImportant Message  Patient Details  Name: [name] MRN: [id number] Date of Birth: 31-Oct-1936   Medicare Important Message Given:  Yes-second notification given    Camillo Flaming 07/16/2015, 11:38 AM

## 2015-07-16 NOTE — Interval H&P Note (Signed)
History and Physical Interval Note:  07/16/2015 8:25 AM  [name]  has presented today for surgery, with the diagnosis of nausea  The various methods of treatment have been discussed with the patient and family. After consideration of risks, benefits and other options for treatment, the patient has consented to  Procedure(s): ESOPHAGOGASTRODUODENOSCOPY (EGD) (N/A) as a surgical intervention .  The patient's history has been reviewed, patient examined, no change in status, stable for surgery.  I have reviewed the patient's chart and labs.  Questions were answered to the patient's satisfaction.     Renelda Loma Armbruster

## 2015-07-16 NOTE — Progress Notes (Signed)
PT Cancellation Note  Patient Details Name: [name] MRN: [id number] DOB: [birth year]   Cancelled Treatment:    Reason Eval/Treat Not Completed: Patient at procedure or test/unavailable. Will check back as schedule allows. Thanks.   Weston Anna, MPT Pager: [phone]

## 2015-07-16 NOTE — Progress Notes (Signed)
[facility] PROGRESS NOTE  [name] QBH:[id number] DOB: 1936-04-13 DOA: 07/14/2015 PCP: Redge Gainer, MD  Assessment/Plan: Principal Problem:   Acute on chronic kidney failure Active Problems:   Essential hypertension   Chronic kidney disease   CVA (cerebral infarction), past history   Ileostomy secondary to diverticulitis   Gastroparesis due to DM   Acute renal failure superimposed on stage 4 chronic kidney disease   Malnutrition of moderate degree   Gastric polyp   Nausea with vomiting    Acute on chronic kidney failure-likely secondary to dehydration, creatinine back to baseline, continue hydration with normal saline, discontinue D5 as hypoglycemia seems to have resolved, , patient sees Dr. Florene Glen in the outpatient setting.   Nausea/gastroparesis/gastroesophageal reflux disease/history of gastric polyps-could have been exacerbated due to recent urinary tract infection, no recent history of viral gastroenteritis. continue IV Reglan, GI started the patient on IV erythromycin, status post EGD that showed multiple gastric polyps , no gastric outlet obstruction, appreciate GI input,, advanced to a more solid diet to see the patient tolerates, anticipate discharge tomorrow if symptoms resolve   Headache-likely secondary to uncontrolled blood pressure, will check MRI brain to rule out CVA   Essential hypertension-uncontrolled in the ER, continue home medications, prn hydralazine. Started on scheduled hydralazine. Continue Norvasc and metoprolol  Abnormal troponin-slight elevation in the setting of renal failure, 2-D echo to rule out wall motion abnormalities pending   Chronic kidney disease-stage 3-4-baseline creatinine around 2.5, creatinine back to baseline   CVA (cerebral infarction), past history-currently on no antiplatelets therapy   Ileostomy secondary to diverticulitis-monitor   Gastroparesis due to DM-continue IV Reglan, started on IV erythromycin,  EGD results as above  Diabetes mellitus, started on D5, now switched to normal saline, hemoglobin A1c 6.9, check Accu-Cheks every 4 hours and sliding scale insulin, patient uses Humalog 50/50 at home.  Recent UTI-UA negative this admission    Code Status:      Code Status Orders        Start     Ordered   07/14/15 1841  Full code   Continuous     07/14/15 1843     Family Communication: family updated about patient's clinical progress Disposition Plan:  Anticipate discharge tomorrow, PT consult  Brief narrative: 79 year old female with a history of diabetes with gastroparesis, hypertension, CVA, chronic kidney disease stage III, monoclonal gammopathy, gastroesophageal reflux disease,fundic gland and hyperplastic gastric polyps, gastroparesis, diverticulitis with resection of most of her colon with ileostomy in place, fatty liver, elevated liver enzymes, followed by Dr. Hilarie Fredrickson for nausea who presents with chronic abdominal pain and nausea for about 2 months. She was last seen by GI on 04/17/15 for nausea that persists throughout the day, not associated with vomiting.She reports poor appetite and is unable to eat solid food 3 meals a day. She reports bowel movements are liquid as always and brown without blood or melena through her ostomy. She complains of dull but chronic abdominal pain. She is using Zofran and Reglan and is also on pantoprazole 40 mg twice daily.  In her own words she states that she is eating 25% of what she normally eats for the last 2 months. CBG was found to be 60. She also reports multiple hypoglycemic events at home. She denies any diarrhea. Patient is on Reglan 3 times a day. CT abdomen pelvis without contrast negative for obstruction. Is a history of recent EGD on 11/15 which showed gastric polyps, which were  benign without dysplasia.  Seen on 8/30 in the ER for nausea which improved with IV Reglan, cardiac enzymes were checked and were found to be negative.  Symptoms were felt to be secondary to chronic gastroparesis. Patient was also found to have a urinary tract infection 8/30 with 11-20 white blood cells in her urine, recently completed a course of Macrodantin.           Consultants:  Gastroenterology  Procedures:  None  Antibiotics: Anti-infectives    Start     Dose/Rate Route Frequency Ordered Stop   07/15/15 1600  erythromycin 250 mg in sodium chloride 0.9 % 100 mL IVPB     250 mg 100 mL/hr over 60 Minutes Intravenous 4 times per day 07/15/15 1521           HPI/Subjective: Continues to endorse nausea, hypertensive overnight  Objective: Filed Vitals:   07/16/15 1100 07/16/15 1105 07/16/15 1110 07/16/15 1115  BP: 154/44     Pulse: 84 84 91 102  Temp:      TempSrc:      Resp: 24 24 27 30   Height:      Weight:      SpO2: 98% 98% 97% 98%    Intake/Output Summary (Last 24 hours) at 07/16/15 1132 Last data filed at 07/16/15 0544  Gross per 24 hour  Intake   1725 ml  Output    500 ml  Net   1225 ml    Exam:  General: No acute respiratory distress Lungs: Clear to auscultation bilaterally without wheezes or crackles Cardiovascular: Regular rate and rhythm without murmur gallop or rub normal S1 and S2 Abdomen: Nontender, nondistended, soft, bowel sounds positive, no rebound, no ascites, no appreciable mass Extremities: No significant cyanosis, clubbing, or edema bilateral lower extremities     Data Review   Micro Results No results found for this or any previous visit (from the past 240 hour(s)).  Radiology Reports Ct Abdomen Pelvis Wo Contrast  07/14/2015   CLINICAL DATA:  Abdominal pain with nausea for 3 weeks.  EXAM: CT ABDOMEN AND PELVIS WITHOUT CONTRAST  TECHNIQUE: Multidetector CT imaging of the abdomen and pelvis was performed following the standard protocol without IV contrast.  COMPARISON:  Multiple exams, including 07/01/2015 and 07/11/2014  FINDINGS: Lower chest: Coronary artery  atherosclerotic calcification along with calcification of the upper portion of the mitral valve. Faint calcification along the tracheobronchial tree at the lung bases, likely incidental.  Hepatobiliary: Unremarkable  Pancreas: Severely atrophic pancreas, with little in the way of recognizable pancreatic parenchymal tissue. This is a chronic appearance.  Spleen: Unremarkable  Adrenals/Urinary Tract: Mild bilateral renal atrophy, otherwise normal.  Stomach/Bowel: The ligament of Treitz appears to be lax, with the proximal jejunum extending caudad into the large peristomal hernia which contains multiple loops of bowel along with accompanying mesentery, in addition to the ostomy site. The colon appears to be entirely intra-abdominal. Anastomotic site at the rectosigmoid junction.  Vascular/Lymphatic: Aortoiliac atherosclerotic vascular disease.  Reproductive: Unremarkable  Other: No supplemental non-categorized findings.  Musculoskeletal: Articular cartilage thinning in both hips. Stable appearance of 70% compression fracture at T12. No change in the low intervertebral disc height at L5-S1.  IMPRESSION: 1. The patient has a large peristomal hernia on the left containing multiple loops of small bowel and mesentery, but this not appreciably changed compared to last year at this time, and there no findings of strangulation or obstruction. The the ligament of Treitz is lax. 2. Other imaging findings of potential  clinical significance: Coronary atherosclerosis; calcified mitral valve; chronically atrophic pancreas ; mild bilateral renal atrophy; aortoiliac atherosclerosis; mild degenerative cartilage thinning in both hips; chronic 70% compression fracture at T12.   Electronically Signed   By: Van Clines M.D.   On: 07/14/2015 18:00   Portable Chest 1 View  07/14/2015   CLINICAL DATA:  79 year old female with abdominal pain and nausea  EXAM: PORTABLE CHEST - 1 VIEW  COMPARISON:  Radiograph dated 07/01/2015   FINDINGS: Single-view of the chest demonstrate emphysematous changes of the lungs. No focal consolidation, pleural effusion, or pneumothorax. Stable cardiac silhouette. The osseous structures appear unremarkable.  IMPRESSION: No active disease.   Electronically Signed   By: Anner Crete M.D.   On: 07/14/2015 19:08   Dg Abd Acute W/chest  07/01/2015   CLINICAL DATA:  Patient with left-sided abdominal pain above colostomy bag. Persistent cough for multiple weeks.  EXAM: DG ABDOMEN ACUTE W/ 1V CHEST  COMPARISON:  Chest radiograph 03/09/2015  FINDINGS: Monitoring leads overlie the patient. Stable cardiac and mediastinal contours. No consolidative pulmonary opacities. No pleural effusion or pneumothorax.  Gas is demonstrated within nondilated loops of large and small bowel within the abdomen. The entire left hemi abdomen is not included on this radiograph however there may potentially be a left parastomal hernia. Lumbar spine degenerative changes.  IMPRESSION: The entire left hemi abdomen is not included on current radiograph however there may be a left parastomal hernia. No evidence for small bowel obstruction.  No acute cardiopulmonary process.   Electronically Signed   By: Lovey Newcomer M.D.   On: 07/01/2015 18:48     CBC  Recent Labs Lab 07/14/15 1435 07/15/15 0119  WBC 9.9 6.9  HGB 13.0 11.2*  HCT 38.6 33.7*  PLT 221 169  MCV 95.1 94.1  MCH 32.0 31.3  MCHC 33.7 33.2  RDW 14.2 14.3    Chemistries   Recent Labs Lab 07/14/15 1435 07/14/15 1854 07/15/15 0119 07/16/15 0506  NA 134*  --  134* 140  K 4.9  --  4.6 4.4  CL 108  --  112* 118*  CO2 16*  --  13* 15*  GLUCOSE 70  --  237* 154*  BUN 33*  --  30* 23*  CREATININE 3.37* 3.24* 3.01* 2.48*  CALCIUM 9.5  --  8.5* 8.6*  MG  --  2.2  --   --   AST 51*  --  48* 45*  ALT 37  --  31 27  ALKPHOS 172*  --  139* 119  BILITOT 0.4  --  0.3 0.7    ------------------------------------------------------------------------------------------------------------------ estimated creatinine clearance is 18.4 mL/min (by C-G formula based on Cr of 2.48). ------------------------------------------------------------------------------------------------------------------  Recent Labs  07/14/15 1435  HGBA1C 6.9*   ------------------------------------------------------------------------------------------------------------------ No results for input(s): CHOL, HDL, LDLCALC, TRIG, CHOLHDL, LDLDIRECT in the last 72 hours. ------------------------------------------------------------------------------------------------------------------  Recent Labs  07/15/15 0119  TSH 1.706   ------------------------------------------------------------------------------------------------------------------ No results for input(s): VITAMINB12, FOLATE, FERRITIN, TIBC, IRON, RETICCTPCT in the last 72 hours.  Coagulation profile No results for input(s): INR, PROTIME in the last 168 hours.  No results for input(s): DDIMER in the last 72 hours.  Cardiac Enzymes  Recent Labs Lab 07/14/15 1854 07/15/15 0119 07/15/15 0801  TROPONINI 0.04* 0.04* 0.04*   ------------------------------------------------------------------------------------------------------------------ Invalid input(s): POCBNP   CBG:  Recent Labs Lab 07/15/15 1706 07/15/15 1948 07/16/15 0017 07/16/15 0536 07/16/15 0737  GLUCAP 237* 146* 132* 128* 177*       Studies: Ct Abdomen  Pelvis Wo Contrast  07/14/2015   CLINICAL DATA:  Abdominal pain with nausea for 3 weeks.  EXAM: CT ABDOMEN AND PELVIS WITHOUT CONTRAST  TECHNIQUE: Multidetector CT imaging of the abdomen and pelvis was performed following the standard protocol without IV contrast.  COMPARISON:  Multiple exams, including 07/01/2015 and 07/11/2014  FINDINGS: Lower chest: Coronary artery atherosclerotic calcification along with  calcification of the upper portion of the mitral valve. Faint calcification along the tracheobronchial tree at the lung bases, likely incidental.  Hepatobiliary: Unremarkable  Pancreas: Severely atrophic pancreas, with little in the way of recognizable pancreatic parenchymal tissue. This is a chronic appearance.  Spleen: Unremarkable  Adrenals/Urinary Tract: Mild bilateral renal atrophy, otherwise normal.  Stomach/Bowel: The ligament of Treitz appears to be lax, with the proximal jejunum extending caudad into the large peristomal hernia which contains multiple loops of bowel along with accompanying mesentery, in addition to the ostomy site. The colon appears to be entirely intra-abdominal. Anastomotic site at the rectosigmoid junction.  Vascular/Lymphatic: Aortoiliac atherosclerotic vascular disease.  Reproductive: Unremarkable  Other: No supplemental non-categorized findings.  Musculoskeletal: Articular cartilage thinning in both hips. Stable appearance of 70% compression fracture at T12. No change in the low intervertebral disc height at L5-S1.  IMPRESSION: 1. The patient has a large peristomal hernia on the left containing multiple loops of small bowel and mesentery, but this not appreciably changed compared to last year at this time, and there no findings of strangulation or obstruction. The the ligament of Treitz is lax. 2. Other imaging findings of potential clinical significance: Coronary atherosclerosis; calcified mitral valve; chronically atrophic pancreas ; mild bilateral renal atrophy; aortoiliac atherosclerosis; mild degenerative cartilage thinning in both hips; chronic 70% compression fracture at T12.   Electronically Signed   By: Van Clines M.D.   On: 07/14/2015 18:00   Portable Chest 1 View  07/14/2015   CLINICAL DATA:  79 year old female with abdominal pain and nausea  EXAM: PORTABLE CHEST - 1 VIEW  COMPARISON:  Radiograph dated 07/01/2015  FINDINGS: Single-view of the chest demonstrate  emphysematous changes of the lungs. No focal consolidation, pleural effusion, or pneumothorax. Stable cardiac silhouette. The osseous structures appear unremarkable.  IMPRESSION: No active disease.   Electronically Signed   By: Anner Crete M.D.   On: 07/14/2015 19:08      Lab Results  Component Value Date   HGBA1C 6.9* 07/14/2015   HGBA1C 7.3 06/10/2015   HGBA1C 8.6% 01/16/2015   Lab Results  Component Value Date   LDLCALC 246* 06/10/2015   CREATININE 2.48* 07/16/2015       Scheduled Meds: . amLODipine  10 mg Oral Daily  . aspirin EC  81 mg Oral Daily  . erythromycin  250 mg Intravenous 4 times per day  . hydrALAZINE  50 mg Oral 3 times per day  . insulin aspart  0-9 Units Subcutaneous TID WC  . levothyroxine  50 mcg Oral QAC breakfast  . metoCLOPramide (REGLAN) injection  5 mg Intravenous 4 times per day  . metoprolol tartrate  25 mg Oral BID  . pantoprazole (PROTONIX) IV  40 mg Intravenous Q12H  . sodium chloride  3 mL Intravenous Q12H   Continuous Infusions: . sodium chloride 75 mL/hr at 07/15/15 2119    Principal Problem:   Acute on chronic kidney failure Active Problems:   Essential hypertension   Chronic kidney disease   CVA (cerebral infarction), past history   Ileostomy secondary to diverticulitis   Gastroparesis due to DM   Acute  renal failure superimposed on stage 4 chronic kidney disease   Malnutrition of moderate degree   Gastric polyp   Nausea with vomiting    Time spent: 45 minutes   [facility] Pager [phone]. If 7PM-7AM, please contact night-coverage at [email or web address], password [facility] 07/16/2015, 11:32 AM  LOS: 2 days

## 2015-07-17 ENCOUNTER — Ambulatory Visit: Payer: Self-pay | Admitting: Pharmacist

## 2015-07-17 ENCOUNTER — Inpatient Hospital Stay (HOSPITAL_COMMUNITY): Payer: Medicare Other

## 2015-07-17 ENCOUNTER — Encounter (HOSPITAL_COMMUNITY): Payer: Self-pay | Admitting: Gastroenterology

## 2015-07-17 LAB — COMPREHENSIVE METABOLIC PANEL
ALBUMIN: 2.7 g/dL — AB (ref 3.5–5.0)
ALT: 30 U/L (ref 14–54)
AST: 60 U/L — AB (ref 15–41)
Alkaline Phosphatase: 117 U/L (ref 38–126)
Anion gap: 8 (ref 5–15)
BUN: 18 mg/dL (ref 6–20)
CHLORIDE: 116 mmol/L — AB (ref 101–111)
CO2: 15 mmol/L — AB (ref 22–32)
Calcium: 8.5 mg/dL — ABNORMAL LOW (ref 8.9–10.3)
Creatinine, Ser: 2.27 mg/dL — ABNORMAL HIGH (ref 0.44–1.00)
GFR calc Af Amer: 23 mL/min — ABNORMAL LOW (ref >60)
GFR calc non Af Amer: 20 mL/min — ABNORMAL LOW (ref >60)
GLUCOSE: 179 mg/dL — AB (ref 65–99)
POTASSIUM: 4.3 mmol/L (ref 3.5–5.1)
Sodium: 139 mmol/L (ref 135–145)
Total Bilirubin: 0.5 mg/dL (ref 0.3–1.2)
Total Protein: 5.8 g/dL — ABNORMAL LOW (ref 6.5–8.1)

## 2015-07-17 LAB — CORTISOL-AM, BLOOD: Cortisol - AM: 23.4 ug/dL — ABNORMAL HIGH (ref 6.7–22.6)

## 2015-07-17 LAB — GLUCOSE, CAPILLARY
GLUCOSE-CAPILLARY: 210 mg/dL — AB (ref 65–99)
Glucose-Capillary: 182 mg/dL — ABNORMAL HIGH (ref 65–99)
Glucose-Capillary: 169 mg/dL — ABNORMAL HIGH (ref 65–99)
Glucose-Capillary: 186 mg/dL — ABNORMAL HIGH (ref 65–99)

## 2015-07-17 LAB — CBC
HEMATOCRIT: 29.1 % — AB (ref 36.0–46.0)
Hemoglobin: 9.5 g/dL — ABNORMAL LOW (ref 12.0–15.0)
MCH: 30.7 pg (ref 26.0–34.0)
MCHC: 32.6 g/dL (ref 30.0–36.0)
MCV: 94.2 fL (ref 78.0–100.0)
PLATELETS: 163 10*3/uL (ref 150–400)
RBC: 3.09 MIL/uL — AB (ref 3.87–5.11)
RDW: 14.9 % (ref 11.5–15.5)
WBC: 7.8 10*3/uL (ref 4.0–10.5)

## 2015-07-17 LAB — MRSA PCR SCREENING: MRSA by PCR: NEGATIVE

## 2015-07-17 MED ORDER — ONDANSETRON 4 MG PO TBDP
8.0000 mg | ORAL_TABLET | Freq: Three times a day (TID) | ORAL | Status: DC
  Administered 2015-07-17 – 2015-07-19 (×7): 8 mg via ORAL
  Filled 2015-07-17 (×7): qty 2

## 2015-07-17 MED ORDER — ERYTHROMYCIN BASE 250 MG PO TABS
250.0000 mg | ORAL_TABLET | Freq: Three times a day (TID) | ORAL | Status: DC
  Administered 2015-07-17 (×3): 250 mg via ORAL
  Filled 2015-07-17 (×8): qty 1

## 2015-07-17 MED ORDER — ERYTHROMYCIN BASE 250 MG PO TBEC
250.0000 mg | DELAYED_RELEASE_TABLET | Freq: Three times a day (TID) | ORAL | Status: DC
  Filled 2015-07-17 (×4): qty 1

## 2015-07-17 MED ORDER — HEPARIN SODIUM (PORCINE) 5000 UNIT/ML IJ SOLN
5000.0000 [IU] | Freq: Three times a day (TID) | INTRAMUSCULAR | Status: DC
  Administered 2015-07-17 – 2015-07-21 (×12): 5000 [IU] via SUBCUTANEOUS
  Filled 2015-07-17 (×11): qty 1

## 2015-07-17 MED ORDER — LORAZEPAM 0.5 MG PO TABS
0.2500 mg | ORAL_TABLET | Freq: Once | ORAL | Status: AC | PRN
  Administered 2015-07-17: 0.25 mg via ORAL
  Filled 2015-07-17: qty 1

## 2015-07-17 MED ORDER — VITAMIN B-6 50 MG PO TABS
50.0000 mg | ORAL_TABLET | Freq: Every day | ORAL | Status: DC
  Administered 2015-07-17 – 2015-07-21 (×4): 50 mg via ORAL
  Filled 2015-07-17 (×5): qty 1

## 2015-07-17 NOTE — Progress Notes (Signed)
Progress Note   Subjective  Patient had EGD yesterday, as below. She reports she continues to be nauseous but she was able to tolerate PO and keep food down, no vomiting. Normal ostomy output, no evidence of bleeding. CT head negative.    Objective   Vital signs in last 24 hours: Temp:  [97.8 F (36.6 C)-98.7 F (37.1 C)] 98.4 F (36.9 C) (09/15 0403) Pulse Rate:  [62-106] 91 (09/15 0403) Resp:  [15-36] 20 (09/15 0403) BP: (94-200)/(27-102) 165/50 mmHg (09/15 0403) SpO2:  [96 %-100 %] 100 % (09/15 0403) Weight:  [180 lb (81.647 kg)] 180 lb (81.647 kg) (09/14 0805) Last BM Date: 07/16/15 General:    white female in NAD Heart:  Regular rate and rhythm; no murmurs Lungs: scattered course BS otherwise lungs CTA bilaterally Abdomen:  Soft, nontender, protuberant, ostomy in LLQ - brown stool in ostomy. Normal bowel sounds. Extremities:  trace edema. Neurologic:  Alert and oriented,  grossly normal neurologically. Psych:  Cooperative. Normal mood and affect.  Intake/Output from previous day: 09/14 0701 - 09/15 0700 In: 575 [I.V.:475; IV Piggyback:100] Out: 975 [Urine:500; Stool:475] Intake/Output this shift:    Lab Results:  Recent Labs  07/14/15 1435 07/15/15 0119 07/17/15 0540  WBC 9.9 6.9 7.8  HGB 13.0 11.2* 9.5*  HCT 38.6 33.7* 29.1*  PLT 221 169 163   BMET  Recent Labs  07/15/15 0119 07/16/15 0506 07/17/15 0540  NA 134* 140 139  K 4.6 4.4 4.3  CL 112* 118* 116*  CO2 13* 15* 15*  GLUCOSE 237* 154* 179*  BUN 30* 23* 18  CREATININE 3.01* 2.48* 2.27*  CALCIUM 8.5* 8.6* 8.5*   LFT  Recent Labs  07/17/15 0540  PROT 5.8*  ALBUMIN 2.7*  AST 60*  ALT 30  ALKPHOS 117  BILITOT 0.5   PT/INR No results for input(s): LABPROT, INR in the last 72 hours.  Studies/Results: Ct Head Wo Contrast  07/16/2015   CLINICAL DATA:  Nausea and headaches.  EXAM: CT HEAD WITHOUT CONTRAST  TECHNIQUE: Contiguous axial images were obtained from the base of the skull  through the vertex without intravenous contrast.  COMPARISON:  10/20/2009  FINDINGS: Stable age related cerebral atrophy, ventriculomegaly and periventricular white matter disease. Remote lacunar-type basal ganglia infarcts are noted. No extra-axial fluid collections are identified. No CT findings for acute hemispheric infarction or intracranial hemorrhage. No mass lesions. The brainstem and cerebellum are normal.  The bony structures are intact. No skull fracture or bone lesion. The paranasal sinuses and mastoid air cells are grossly clear. The globes are intact.  IMPRESSION: Chronic changes in the brain but no acute findings.   Electronically Signed   By: Marijo Sanes M.D.   On: 07/16/2015 14:43       Assessment / Plan:   79 y/o female with gastroparesis presenting with severe nausea, poor appetite, and AKI. No significant vomiting but she does have some wretching. Nausea both postprandial and sporadic. Reglan has not provided benefit. It remains unclear to me if this is due to gastroparesis or other pathology. EGD yesterday showed some gastric polyps, the largest were removed but all were < 1cm and not causing symptoms. Biopsies taken to rule out H pylori. No evidence of gastric outlet obstruction. CT abdomen without obstructive pathology. She has been complaining of headaches thus CT head obtained which was negative, primary has also ordered MRI brain to ensure no CNS process.   She has been on IV erythromycin and patient  reports no significant change in her nausea. For her gastroparesis would recommend small, more frequent meals, and I discussed this with her at length. I would recommend standing zofran every 8 hours, whether or not she is nauseated at the time. May consider PM dose of phenergan as well to help sleep. She can also try vitamin B6 supplement empirically as this can sometimes help patients with chronic nausea.   Her H/H has downtrended since admission although suspect some of this is  hemodilutional given dehydration on admission. No blood noted in ostomy output as of this AM or evidence of GI bleeding. BUN trending down. Cr back to near baseline. Would d/c fluids and see if patient can maintain hydration with measures above, as she is interested in going home. We will otherwise await pathology results. Could consider fasting AM cortisol level to assess for adrenal insufficiency which can present like this, but she does not have risk factors for this.   Please call with questions or changes in her status.   Prue Cellar, MD [facility] Pager [phone]    Principal Problem:   Acute on chronic kidney failure Active Problems:   Essential hypertension   Chronic kidney disease   CVA (cerebral infarction), past history   Ileostomy secondary to diverticulitis   Gastroparesis due to DM   Acute renal failure superimposed on stage 4 chronic kidney disease   Malnutrition of moderate degree   Gastric polyp   Nausea with vomiting     LOS: 3 days   [place]  07/17/2015, 7:29 AM

## 2015-07-17 NOTE — Progress Notes (Signed)
RN notified [facility] of pt's tranfer order to [facility]. Also called and gave report to Parke Simmers, RN on 5N at [facility]. Pt is currently stable and resting with eyes closed. Will continue to monitor pt.

## 2015-07-17 NOTE — Evaluation (Addendum)
Physical Therapy Evaluation Patient Details Name: SUNITA DEMOND MRN: [id number] DOB: May 01, 1936 Today's Date: 07/17/2015   History of Present Illness  79 yo female admitted with acute on chronic kidney failure, nausea, gastroparesis. Hx of CVA, stage IV kidney disease, HTN, DM  Clinical Impression  On eval, pt required Min assist for mobility-performed stand pivot from recliner to bed with RW. Mobility limited by lethargy. Pt had trouble keeping eyes open during session. Deferred ambulation for safety reasons. Will continue to follow and progress activity as able. Recommend HH vs SNF depending on progress.     Follow Up Recommendations Home health PT;Supervision/Assistance - 24 hour vs SNF (depending on progress and husband's abiity to provide care)    Equipment Recommendations  None recommended by PT    Recommendations for Other Services       Precautions / Restrictions Precautions Precautions: Fall Restrictions Weight Bearing Restrictions: No      Mobility  Bed Mobility Overal bed mobility: Needs Assistance Bed Mobility: Sit to Supine       Sit to supine: Min assist   General bed mobility comments: small amount of assist for LEs onto bed. Increased time.  Transfers Overall transfer level: Needs assistance Equipment used: Rolling walker (2 wheeled) Transfers: Sit to/from [facility] Sit to Stand: Min assist Stand pivot transfers: Min assist       General transfer comment: Assist to rise, stabilize, control descent. Multimodal cueing for safety, technique, hand placement. Stand pivot from recliner to bed with RW.  Ambulation/Gait                Stairs            Wheelchair Mobility    Modified Rankin (Stroke Patients Only)       Balance           Standing balance support: Bilateral upper extremity supported;During functional activity Standing balance-Leahy Scale: Poor                                Pertinent Vitals/Pain Pain Assessment: No/denies pain    Home Living Family/patient expects to be discharged to:: Private residence Living Arrangements: Spouse/significant other   Type of Home: House Home Access: Ramped entrance     Home Layout: One level;Laundry or work area in [place]: Environmental consultant - 2 wheels      Prior Function Level of Independence: Independent with assistive device(s)               Hand Dominance        Extremity/Trunk Assessment   Upper Extremity Assessment: Generalized weakness           Lower Extremity Assessment: Generalized weakness      Cervical / Trunk Assessment: Normal  Communication      Cognition Arousal/Alertness: Lethargic Behavior During Therapy: Flat affect Overall Cognitive Status: Impaired/Different from baseline Area of Impairment: Problem solving             Problem Solving: Slow processing;Requires verbal cues;Requires tactile cues      General Comments      Exercises General Exercises - Lower Extremity Hip Flexion/Marching: AROM;5 reps;Standing Heel Raises: AROM;Both;5 reps;Standing      Assessment/Plan    PT Assessment Patient needs continued PT services  PT Diagnosis Difficulty walking;Generalized weakness;Altered mental status   PT Problem List Decreased strength;Decreased activity tolerance;Decreased balance;Decreased mobility;Decreased knowledge of use of DME;Decreased safety awareness;Decreased cognition  PT  Treatment Interventions DME instruction;Gait training;Functional mobility training;Therapeutic activities;Patient/family education;Balance training;Therapeutic exercise   PT Goals (Current goals can be found in the Care Plan section) Acute Rehab PT Goals Patient Stated Goal: did not state PT Goal Formulation: With patient/family Time For Goal Achievement: 07/31/15 Potential to Achieve Goals: Fair    Frequency Min 3X/week   Barriers to discharge         Co-evaluation               End of Session Equipment Utilized During Treatment: Gait belt Activity Tolerance: Patient limited by fatigue Patient left: in bed;with call bell/phone within reach;with bed alarm set;with family/visitor present           Time: 4888-9169 PT Time Calculation (min) (ACUTE ONLY): 13 min   Charges:   PT Evaluation $Initial PT Evaluation Tier I: 1 Procedure     PT G Codes:        Weston Anna, MPT Pager: [phone]

## 2015-07-17 NOTE — Progress Notes (Signed)
CRITICAL VALUE ALERT  Critical value received:  brain MRI results with multifocal acute infarct.  Date of notification:  07/17/15  Time of notification:  1610  Critical value read back:Yes.    Nurse who received alert:  Sheffield Slider  MD notified (1st page):  Lisabeth Devoid  Time of first page:  1902  MD notified (2nd page):  Time of second page:  Responding MD:  Lisabeth Devoid  Time MD responded:  [phone]

## 2015-07-17 NOTE — Progress Notes (Signed)
Notified by nursing staff of brain MRI results with multifocal acute infarct. Spoke with both Dr. Allyson Sabal and neurologist Dr. Leonel Ramsay who recommended transfer to [place] for evaluation. Symptoms have been present since this morning, no significant changes throughout the day according to nursing staff. Pt currently stable. I spoke with the pt's husband who agreed to the transfer and was able to verbalize the plan for the patient.

## 2015-07-17 NOTE — Evaluation (Signed)
Occupational Therapy Evaluation Patient Details Name: [name] MRN: [id number] DOB: [birth year] Today's Date: 07/17/2015    History of Present Illness 79 year old female with a history of diabetes with gastroparesis, hypertension, CVA, chronic kidney disease stage III, monoclonal gammopathy, gastroesophageal reflux disease,fundic gland and hyperplastic gastric polyps, gastroparesis, diverticulitis with resection of most of her colon with ileostomy in place, fatty liver, elevated liver enzymes, followed by Dr. Hilarie Fredrickson for nausea who presents with chronic abdominal pain and nausea for about 2 months. She was last seen by GI on 04/17/15 for nausea that persists throughout the day, not associated with vomiting.She reports poor appetite and is unable to eat solid food 3 meals a day. She reports bowel movements are liquid as always and brown without blood or melena through her ostomy. She complains of dull but chronic abdominal pain   Clinical Impression   Pt admitted with stomach pain. Pt currently with functional limitations due to the deficits listed below (see OT Problem List).  Pt will benefit from skilled OT to increase their safety and independence with ADL and functional mobility for ADL to facilitate discharge to venue listed below.     Follow Up Recommendations  SNF;Home health OT;Supervision/Assistance - 24 hour    Equipment Recommendations  None recommended by OT       Precautions / Restrictions Restrictions Weight Bearing Restrictions: No      Mobility Bed Mobility Overal bed mobility: Needs Assistance Bed Mobility: Supine to Sit;Sit to Supine     Supine to sit: Mod assist Sit to supine: Mod assist      Transfers Overall transfer level: Needs assistance Equipment used: 1 person hand held assist Transfers: Sit to/from Stand Sit to Stand: Mod assist         General transfer comment: stepping up to head of the bed    Balance Overall balance assessment: Needs  assistance Sitting-balance support: No upper extremity supported;Feet supported Sitting balance-Leahy Scale: Good       Standing balance-Leahy Scale: Fair                              ADL Overall ADL's : Needs assistance/impaired Eating/Feeding: Minimal assistance;Sitting   Grooming: Minimal assistance;Sitting   Upper Body Bathing: Moderate assistance;Sitting   Lower Body Bathing: Maximal assistance;Sit to/from stand   Upper Body Dressing : Minimal assistance;Sitting   Lower Body Dressing: Moderate assistance;Sit to/from stand   Toilet Transfer: Moderate assistance;BSC;Cueing for safety   Toileting- Clothing Manipulation and Hygiene: Moderate assistance;Sit to/from stand;Cueing for safety                         Pertinent Vitals/Pain Pain Assessment: No/denies pain     Hand Dominance     Extremity/Trunk Assessment Upper Extremity Assessment Upper Extremity Assessment: Generalized weakness (able to use BUE - but delayed with movements)           Communication     Cognition Arousal/Alertness: Awake/alert;Lethargic (was lethargic then woke but seemed to have delays in processing)   Overall Cognitive Status: Impaired/Different from baseline Area of Impairment:  (delayed processing)                   General Comments   pt with delayed processing this OT visit and some word finding difficulties.            Home Living Family/patient expects to be discharged to:: Private residence Living Arrangements:  Spouse/significant other                                      Prior Functioning/Environment               OT Diagnosis: Generalized weakness;Cognitive deficits;Altered mental status   OT Problem List: Decreased strength;Decreased activity tolerance;Impaired balance (sitting and/or standing);Decreased cognition   OT Treatment/Interventions: Self-care/ADL training;DME and/or AE instruction;Patient/family  education;Balance training    OT Goals(Current goals can be found in the care plan section) Acute Rehab OT Goals Patient Stated Goal: did not state Time For Goal Achievement: 07/31/15 Potential to Achieve Goals: Good  OT Frequency: Min 2X/week   Barriers to D/C:    husband can provide S- but not physical A       Co-evaluation              End of Session Nurse Communication: Mobility status  Activity Tolerance: Patient tolerated treatment well Patient left: in bed;with call bell/phone within reach;with family/visitor present;with bed alarm set   Time: 2130-8657 OT Time Calculation (min): 25 min Charges:  OT General Charges $OT Visit: 1 Procedure OT Evaluation $Initial OT Evaluation Tier I: 1 Procedure OT Treatments $Self Care/Home Management : 8-22 mins G-Codes:    Payton Mccallum D 2015-08-02, 12:25 PM

## 2015-07-17 NOTE — Progress Notes (Addendum)
[facility] PROGRESS NOTE  [name] IEP:[id number] DOB: 1936-06-13 DOA: 07/14/2015 PCP: Redge Gainer, MD  Assessment/Plan: Principal Problem:   Acute on chronic kidney failure Active Problems:   Essential hypertension   Chronic kidney disease   CVA (cerebral infarction), past history   Ileostomy secondary to diverticulitis   Gastroparesis due to DM   Acute renal failure superimposed on stage 4 chronic kidney disease   Malnutrition of moderate degree   Gastric polyp   Nausea with vomiting  Subjective-patient somnolent and barely arousable but when aroused the patient's speech is slurred and slow to follow commands  Assessment and plan Acute on chronic kidney failure-likely secondary to dehydration, improving, creatinine back to baseline, DC normal saline, and see if the patient can keep fluids down and hydrate herself adequately prior to discharge    Nausea/gastroparesis/gastroesophageal reflux disease/history of gastric polyps-could have been exacerbated due to recent urinary tract infection, no recent history of viral gastroenteritis. GI has recommended to place the patient on scheduled doses of Zofran, Reglan, GI started the patient on IV erythromycin, now switched to by mouth erythromycin, status post EGD that showed multiple gastric polyps , no gastric outlet obstruction, appreciate GI input,, advanced to a more solid diet to see the patient tolerates, anticipate discharge if patient is stable tomorrow  Headache-/altered mental status-likely secondary to Ambien and Ativan last night, discontinue Ambien, headache likely secondary to uncontrolled blood pressure, MRI of the brain pending.   Essential hypertension-somewhat improved overnight, continue home medications, prn hydralazine. Continue scheduled hydralazine. Continue Norvasc and metoprolol  Abnormal troponin-slight elevation in the setting of renal failure, now 0.04 and unchanged 3, peak value was 0.09  suspect that this is likely in the setting of renal failure and does not reflect any cardiac process, 2-D echo does not show any regional wall motion abnormalities, EF 55-60%   Chronic kidney disease-stage 3-4-baseline creatinine around 2.5, creatinine back to baseline   CVA (cerebral infarction), past history-  MRI of the brain pending, cannot rule out TIA at this point. cont on aspirin   Ileostomy secondary to diverticulitis-no change in ostomy output since admission   Diabetes mellitus, started on D5, now switched to normal saline, hemoglobin A1c 6.9, check Accu-Cheks every 4 hours and sliding scale insulin, patient uses Humalog 50/50 at home.  Recent UTI-UA negative this admission  DVT prophylaxis-heparin, and SCDs  Code Status:      Code Status Orders        Start     Ordered   07/14/15 1841  Full code   Continuous     07/14/15 1843     Family Communication: family updated about patient's clinical progress Disposition Plan:  PT recommends SNF versus home health   Brief narrative: 79 year old female with a history of diabetes with gastroparesis, hypertension, CVA, chronic kidney disease stage III, monoclonal gammopathy, gastroesophageal reflux disease,fundic gland and hyperplastic gastric polyps, gastroparesis, diverticulitis with resection of most of her colon with ileostomy in place, fatty liver, elevated liver enzymes, followed by Dr. Hilarie Fredrickson for nausea who presents with chronic abdominal pain and nausea for about 2 months. She was last seen by GI on 04/17/15 for nausea that persists throughout the day, not associated with vomiting.She reports poor appetite and is unable to eat solid food 3 meals a day. She reports bowel movements are liquid as always and brown without blood or melena through her ostomy. She complains of dull but chronic abdominal pain. She is using Zofran  and Reglan and is also on pantoprazole 40 mg twice daily.  In her own words she states that she is  eating 25% of what she normally eats for the last 2 months. CBG was found to be 60. She also reports multiple hypoglycemic events at home. She denies any diarrhea. Patient is on Reglan 3 times a day. CT abdomen pelvis without contrast negative for obstruction. Is a history of recent EGD on 11/15 which showed gastric polyps, which were benign without dysplasia.  Seen on 8/30 in the ER for nausea which improved with IV Reglan, cardiac enzymes were checked and were found to be negative. Symptoms were felt to be secondary to chronic gastroparesis. Patient was also found to have a urinary tract infection 8/30 with 11-20 white blood cells in her urine, recently completed a course of Macrodantin.           Consultants:  Gastroenterology  Procedures:  None  Antibiotics: Anti-infectives    Start     Dose/Rate Route Frequency Ordered Stop   07/17/15 1200  erythromycin (ERY-TAB) EC tablet 250 mg  Status:  Discontinued     250 mg Oral 3 times daily with meals & bedtime 07/17/15 0940 07/17/15 0941   07/17/15 1200  erythromycin (E-MYCIN) tablet 250 mg     250 mg Oral 3 times daily with meals & bedtime 07/17/15 0941     07/15/15 1600  erythromycin 250 mg in sodium chloride 0.9 % 100 mL IVPB  Status:  Discontinued     250 mg 100 mL/hr over 60 Minutes Intravenous 4 times per day 07/15/15 1521 07/17/15 0939       Objective: Filed Vitals:   07/17/15 1012 07/17/15 1013 07/17/15 1223 07/17/15 1300  BP: 153/55 153/55 170/51 157/49  Pulse: 90  73   Temp:   98.2 F (36.8 C)   TempSrc:   Oral   Resp:   20   Height:      Weight:      SpO2:   97%     Intake/Output Summary (Last 24 hours) at 07/17/15 1346 Last data filed at 07/17/15 1305  Gross per 24 hour  Intake    520 ml  Output    950 ml  Net   -430 ml    Exam:  General: Somnolent but arousable Lungs: Clear to auscultation bilaterally without wheezes or crackles Cardiovascular: Regular rate and rhythm without murmur gallop or  rub normal S1 and S2 Abdomen: Nontender, nondistended, soft, bowel sounds positive, no rebound, no ascites, no appreciable mass Extremities: No significant cyanosis, clubbing, or edema bilateral lower extremities     Data Review   Micro Results Recent Results (from the past 240 hour(s))  MRSA PCR Screening     Status: None   Collection Time: 07/17/15 10:43 AM  Result Value Ref Range Status   MRSA by PCR NEGATIVE NEGATIVE Final    Comment:        The GeneXpert MRSA Assay (FDA approved for NASAL specimens only), is one component of a comprehensive MRSA colonization surveillance program. It is not intended to diagnose MRSA infection nor to guide or monitor treatment for MRSA infections.     Radiology Reports Ct Abdomen Pelvis Wo Contrast  07/14/2015   CLINICAL DATA:  Abdominal pain with nausea for 3 weeks.  EXAM: CT ABDOMEN AND PELVIS WITHOUT CONTRAST  TECHNIQUE: Multidetector CT imaging of the abdomen and pelvis was performed following the standard protocol without IV contrast.  COMPARISON:  Multiple exams, including  07/01/2015 and 07/11/2014  FINDINGS: Lower chest: Coronary artery atherosclerotic calcification along with calcification of the upper portion of the mitral valve. Faint calcification along the tracheobronchial tree at the lung bases, likely incidental.  Hepatobiliary: Unremarkable  Pancreas: Severely atrophic pancreas, with little in the way of recognizable pancreatic parenchymal tissue. This is a chronic appearance.  Spleen: Unremarkable  Adrenals/Urinary Tract: Mild bilateral renal atrophy, otherwise normal.  Stomach/Bowel: The ligament of Treitz appears to be lax, with the proximal jejunum extending caudad into the large peristomal hernia which contains multiple loops of bowel along with accompanying mesentery, in addition to the ostomy site. The colon appears to be entirely intra-abdominal. Anastomotic site at the rectosigmoid junction.  Vascular/Lymphatic: Aortoiliac  atherosclerotic vascular disease.  Reproductive: Unremarkable  Other: No supplemental non-categorized findings.  Musculoskeletal: Articular cartilage thinning in both hips. Stable appearance of 70% compression fracture at T12. No change in the low intervertebral disc height at L5-S1.  IMPRESSION: 1. The patient has a large peristomal hernia on the left containing multiple loops of small bowel and mesentery, but this not appreciably changed compared to last year at this time, and there no findings of strangulation or obstruction. The the ligament of Treitz is lax. 2. Other imaging findings of potential clinical significance: Coronary atherosclerosis; calcified mitral valve; chronically atrophic pancreas ; mild bilateral renal atrophy; aortoiliac atherosclerosis; mild degenerative cartilage thinning in both hips; chronic 70% compression fracture at T12.   Electronically Signed   By: Van Clines M.D.   On: 07/14/2015 18:00   Ct Head Wo Contrast  07/16/2015   CLINICAL DATA:  Nausea and headaches.  EXAM: CT HEAD WITHOUT CONTRAST  TECHNIQUE: Contiguous axial images were obtained from the base of the skull through the vertex without intravenous contrast.  COMPARISON:  10/20/2009  FINDINGS: Stable age related cerebral atrophy, ventriculomegaly and periventricular white matter disease. Remote lacunar-type basal ganglia infarcts are noted. No extra-axial fluid collections are identified. No CT findings for acute hemispheric infarction or intracranial hemorrhage. No mass lesions. The brainstem and cerebellum are normal.  The bony structures are intact. No skull fracture or bone lesion. The paranasal sinuses and mastoid air cells are grossly clear. The globes are intact.  IMPRESSION: Chronic changes in the brain but no acute findings.   Electronically Signed   By: Marijo Sanes M.D.   On: 07/16/2015 14:43   Portable Chest 1 View  07/14/2015   CLINICAL DATA:  79 year old female with abdominal pain and nausea  EXAM:  PORTABLE CHEST - 1 VIEW  COMPARISON:  Radiograph dated 07/01/2015  FINDINGS: Single-view of the chest demonstrate emphysematous changes of the lungs. No focal consolidation, pleural effusion, or pneumothorax. Stable cardiac silhouette. The osseous structures appear unremarkable.  IMPRESSION: No active disease.   Electronically Signed   By: Anner Crete M.D.   On: 07/14/2015 19:08   Dg Abd Acute W/chest  07/01/2015   CLINICAL DATA:  Patient with left-sided abdominal pain above colostomy bag. Persistent cough for multiple weeks.  EXAM: DG ABDOMEN ACUTE W/ 1V CHEST  COMPARISON:  Chest radiograph 03/09/2015  FINDINGS: Monitoring leads overlie the patient. Stable cardiac and mediastinal contours. No consolidative pulmonary opacities. No pleural effusion or pneumothorax.  Gas is demonstrated within nondilated loops of large and small bowel within the abdomen. The entire left hemi abdomen is not included on this radiograph however there may potentially be a left parastomal hernia. Lumbar spine degenerative changes.  IMPRESSION: The entire left hemi abdomen is not included on current radiograph however  there may be a left parastomal hernia. No evidence for small bowel obstruction.  No acute cardiopulmonary process.   Electronically Signed   By: Lovey Newcomer M.D.   On: 07/01/2015 18:48     CBC  Recent Labs Lab 07/14/15 1435 07/15/15 0119 07/17/15 0540  WBC 9.9 6.9 7.8  HGB 13.0 11.2* 9.5*  HCT 38.6 33.7* 29.1*  PLT 221 169 163  MCV 95.1 94.1 94.2  MCH 32.0 31.3 30.7  MCHC 33.7 33.2 32.6  RDW 14.2 14.3 14.9    Chemistries   Recent Labs Lab 07/14/15 1435 07/14/15 1854 07/15/15 0119 07/16/15 0506 07/17/15 0540  NA 134*  --  134* 140 139  K 4.9  --  4.6 4.4 4.3  CL 108  --  112* 118* 116*  CO2 16*  --  13* 15* 15*  GLUCOSE 70  --  237* 154* 179*  BUN 33*  --  30* 23* 18  CREATININE 3.37* 3.24* 3.01* 2.48* 2.27*  CALCIUM 9.5  --  8.5* 8.6* 8.5*  MG  --  2.2  --   --   --   AST 51*  --   48* 45* 60*  ALT 37  --  31 27 30   ALKPHOS 172*  --  139* 119 117  BILITOT 0.4  --  0.3 0.7 0.5   ------------------------------------------------------------------------------------------------------------------ estimated creatinine clearance is 20.1 mL/min (by C-G formula based on Cr of 2.27). ------------------------------------------------------------------------------------------------------------------  Recent Labs  07/14/15 1435  HGBA1C 6.9*   ------------------------------------------------------------------------------------------------------------------ No results for input(s): CHOL, HDL, LDLCALC, TRIG, CHOLHDL, LDLDIRECT in the last 72 hours. ------------------------------------------------------------------------------------------------------------------  Recent Labs  07/15/15 0119  TSH 1.706   ------------------------------------------------------------------------------------------------------------------ No results for input(s): VITAMINB12, FOLATE, FERRITIN, TIBC, IRON, RETICCTPCT in the last 72 hours.  Coagulation profile No results for input(s): INR, PROTIME in the last 168 hours.  No results for input(s): DDIMER in the last 72 hours.  Cardiac Enzymes  Recent Labs Lab 07/14/15 1854 07/15/15 0119 07/15/15 0801  TROPONINI 0.04* 0.04* 0.04*   ------------------------------------------------------------------------------------------------------------------ Invalid input(s): POCBNP   CBG:  Recent Labs Lab 07/16/15 1202 07/16/15 1606 07/16/15 2159 07/17/15 0725 07/17/15 1112  GLUCAP 174* 162* 166* 182* 169*       Studies: Ct Head Wo Contrast  07/16/2015   CLINICAL DATA:  Nausea and headaches.  EXAM: CT HEAD WITHOUT CONTRAST  TECHNIQUE: Contiguous axial images were obtained from the base of the skull through the vertex without intravenous contrast.  COMPARISON:  10/20/2009  FINDINGS: Stable age related cerebral atrophy, ventriculomegaly and  periventricular white matter disease. Remote lacunar-type basal ganglia infarcts are noted. No extra-axial fluid collections are identified. No CT findings for acute hemispheric infarction or intracranial hemorrhage. No mass lesions. The brainstem and cerebellum are normal.  The bony structures are intact. No skull fracture or bone lesion. The paranasal sinuses and mastoid air cells are grossly clear. The globes are intact.  IMPRESSION: Chronic changes in the brain but no acute findings.   Electronically Signed   By: Marijo Sanes M.D.   On: 07/16/2015 14:43      Lab Results  Component Value Date   HGBA1C 6.9* 07/14/2015   HGBA1C 7.3 06/10/2015   HGBA1C 8.6% 01/16/2015   Lab Results  Component Value Date   LDLCALC 246* 06/10/2015   CREATININE 2.27* 07/17/2015       Scheduled Meds: . amLODipine  10 mg Oral Daily  . aspirin EC  81 mg Oral Daily  . erythromycin  250 mg Oral  TID WC & HS  . hydrALAZINE  50 mg Oral 3 times per day  . insulin aspart  0-9 Units Subcutaneous TID WC  . levothyroxine  50 mcg Oral QAC breakfast  . metoCLOPramide (REGLAN) injection  5 mg Intravenous 4 times per day  . metoprolol tartrate  25 mg Oral BID  . ondansetron  8 mg Oral Q8H  . pantoprazole (PROTONIX) IV  40 mg Intravenous Q12H  . vitamin B-6  50 mg Oral Daily  . sodium chloride  3 mL Intravenous Q12H   Continuous Infusions: . sodium chloride 75 mL/hr at 07/17/15 1035    Principal Problem:   Acute on chronic kidney failure Active Problems:   Essential hypertension   Chronic kidney disease   CVA (cerebral infarction), past history   Ileostomy secondary to diverticulitis   Gastroparesis due to DM   Acute renal failure superimposed on stage 4 chronic kidney disease   Malnutrition of moderate degree   Gastric polyp   Nausea with vomiting    Time spent: 45 minutes   [facility] Pager [phone]. If 7PM-7AM, please contact night-coverage at [email or web address], password  [facility] 07/17/2015, 1:46 PM  LOS: 3 days

## 2015-07-17 NOTE — Progress Notes (Signed)
ANTICOAGULATION CONSULT NOTE - Initial Consult  Pharmacy Consult for heparin SQ Indication: VTE prophylaxis  Allergies  Allergen Reactions  . Ace Inhibitors Other (See Comments)    unknown  . Actos [Pioglitazone Hydrochloride] Other (See Comments)    Jitters   . Aspirin     REACTION: GI upset  . Codeine     REACTION: hallucinations  . Hydromorphone Hcl Nausea And Vomiting  . Metformin And Related Nausea And Vomiting  . Penicillins     REACTION: "crazy feeling"  . Procaine Hcl Other (See Comments)    Pt goes crazy   . Statins Other (See Comments)    myalgias  . Sulfa Antibiotics     Patient Measurements: Height: 5\' 1"  (154.9 cm) Weight: 180 lb (81.647 kg) IBW/kg (Calculated) : 47.8  Vital Signs: Temp: 98.2 F (36.8 C) (09/15 1223) Temp Source: Oral (09/15 1223) BP: 157/49 mmHg (09/15 1300) Pulse Rate: 73 (09/15 1223)  Labs:  Recent Labs  07/14/15 1435 07/14/15 1854 07/15/15 0119 07/15/15 0801 07/16/15 0506 07/17/15 0540  HGB 13.0  --  11.2*  --   --  9.5*  HCT 38.6  --  33.7*  --   --  29.1*  PLT 221  --  169  --   --  163  CREATININE 3.37* 3.24* 3.01*  --  2.48* 2.27*  TROPONINI  --  0.04* 0.04* 0.04*  --   --     Estimated Creatinine Clearance: 20.1 mL/min (by C-G formula based on Cr of 2.27).   Medical History: Past Medical History  Diagnosis Date  . Diverticulitis 2005    Colonoscopy--Dr. Sharlett Iles   . Diabetes mellitus without complication   . Hyperlipidemia   . Hypertension   . Stroke     Past history  . Hepatic steatosis   . Anemia   . Arthritis   . Chronic kidney disease (CKD)     past dialysis  . Status post dilation of esophageal narrowing   . MRSA infection 07/2009  . Proteinuria 09/23/2014  . MGUS (monoclonal gammopathy of unknown significance) 10/10/2014  . Hyperplastic colon polyp   . Fundic gland polyps of stomach, benign   . GERD (gastroesophageal reflux disease)   . Gastroparesis   . CVA (cerebral vascular accident)      Medications:  Scheduled:  . amLODipine  10 mg Oral Daily  . aspirin EC  81 mg Oral Daily  . erythromycin  250 mg Oral TID WC & HS  . heparin subcutaneous  5,000 Units Subcutaneous 3 times per day  . hydrALAZINE  50 mg Oral 3 times per day  . insulin aspart  0-9 Units Subcutaneous TID WC  . levothyroxine  50 mcg Oral QAC breakfast  . metoCLOPramide (REGLAN) injection  5 mg Intravenous 4 times per day  . metoprolol tartrate  25 mg Oral BID  . ondansetron  8 mg Oral Q8H  . pantoprazole (PROTONIX) IV  40 mg Intravenous Q12H  . vitamin B-6  50 mg Oral Daily  . sodium chloride  3 mL Intravenous Q12H    Assessment: 79 y.o. female admitted 07/14/2015 for N/V, gastroparesis. Pharmacy to dose heparin for VTE prophylaxis.   CBC: Hgb dropped; likely dilutional  SCr: 2.27; CrCl 20  Previous anticoagulation: none   Goal of Therapy: Prevention of VTE  Plan:  Heparin 5000 units SQ TID  Pharmacy to sign off as no future dose adjustments anticipated, but will monitor periodically for further drops in Hgb   Reuel Boom,  PharmD, BCPS Pager: [phone] 07/17/2015, 1:56 PM

## 2015-07-18 ENCOUNTER — Ambulatory Visit: Payer: Medicare Other | Admitting: Physician Assistant

## 2015-07-18 ENCOUNTER — Inpatient Hospital Stay (HOSPITAL_COMMUNITY): Payer: Medicare Other

## 2015-07-18 DIAGNOSIS — E785 Hyperlipidemia, unspecified: Secondary | ICD-10-CM | POA: Insufficient documentation

## 2015-07-18 DIAGNOSIS — I639 Cerebral infarction, unspecified: Secondary | ICD-10-CM

## 2015-07-18 DIAGNOSIS — E1159 Type 2 diabetes mellitus with other circulatory complications: Secondary | ICD-10-CM | POA: Insufficient documentation

## 2015-07-18 DIAGNOSIS — M79605 Pain in left leg: Secondary | ICD-10-CM

## 2015-07-18 LAB — GLUCOSE, CAPILLARY
GLUCOSE-CAPILLARY: 165 mg/dL — AB (ref 65–99)
GLUCOSE-CAPILLARY: 238 mg/dL — AB (ref 65–99)
GLUCOSE-CAPILLARY: 207 mg/dL — AB (ref 65–99)

## 2015-07-18 MED ORDER — PANTOPRAZOLE SODIUM 40 MG PO TBEC
40.0000 mg | DELAYED_RELEASE_TABLET | Freq: Every day | ORAL | Status: DC
  Administered 2015-07-18 – 2015-07-20 (×3): 40 mg via ORAL
  Filled 2015-07-18 (×3): qty 1

## 2015-07-18 MED ORDER — STROKE: EARLY STAGES OF RECOVERY BOOK
Freq: Once | Status: AC
  Administered 2015-07-18: 04:00:00
  Filled 2015-07-18: qty 1

## 2015-07-18 MED ORDER — ENSURE ENLIVE PO LIQD
237.0000 mL | Freq: Two times a day (BID) | ORAL | Status: DC
  Administered 2015-07-18 – 2015-07-21 (×3): 237 mL via ORAL
  Filled 2015-07-18 (×8): qty 237

## 2015-07-18 MED ORDER — METOCLOPRAMIDE HCL 5 MG PO TABS: 5.0000 mg | ORAL_TABLET | Freq: Four times a day (QID) | ORAL | Status: DC | PRN

## 2015-07-18 NOTE — Progress Notes (Signed)
07/18/15 1500  OT Visit Information  Last OT Received On 07/18/15  Assistance Needed +2 (for ambulation)  PT/OT/SLP Co-Evaluation/Treatment Yes  Reason for Co-Treatment For patient/therapist safety  OT goals addressed during session ADL's and self-care  History of Present Illness 79 yo female admitted with acute on chronic kidney failure, nausea, gastroparesis. Pt with increased lethargy, confusion and difficulty with word finding on 9/15 with MRI consistent with R frontal infarct.  Pt was [facility] to [facility]. Hx of CVA, stage IV kidney disease, HTN, DM  Precautions  Precautions Fall  Restrictions  Weight Bearing Restrictions No  Home Living  Family/patient expects to be discharged to: Private residence  Living Arrangements Spouse/significant other  Available Help at Discharge Family;Available 24 hours/day  Type of [place] One level;Laundry or work area in basement  [employer] Other (comment) (walk in tub)  Tax adviser - 2 wheels;Hand held shower head  Prior Function  Level of [place] Needs assistance  Gait / [place] ambulated with RW  ADL's / [place] Husband performs all IADL, assists pt extensively with bathing and dressing, pt able to self feed, groom and toilet without assist.  Communication  Communication Expressive difficulties (very little speech, one word and incomplete responses)  Pain Assessment  Pain Assessment No/denies pain  Cognition  Arousal/Alertness Awake/alert  Behavior During Therapy Flat affect  Overall Cognitive Status Impaired/Different from baseline  Area of Impairment Orientation;Attention;Following commands;Safety/judgement;Awareness;Problem solving  Orientation Level Disoriented to;Time  Current Attention Level Focused  Following Commands Follows one step commands inconsistently (with multimodal cues)   Safety/Judgement Decreased awareness of safety;Decreased awareness of deficits  Awareness Intellectual  Problem Solving Slow processing;Requires verbal cues;Requires tactile cues  General Comments husband reports pt is typically more talkative  Upper Extremity Assessment  Upper Extremity Assessment Generalized weakness  Lower Extremity Assessment  Lower Extremity Assessment Defer to PT evaluation  ADL  Overall ADL's  Needs assistance/impaired  Eating/Feeding Minimal assistance;Sitting  Grooming Minimal assistance;Sitting  Upper Body Bathing Moderate assistance;Sitting  Lower Body Bathing Maximal assistance;Sit to/from stand  Upper Body Dressing  Minimal assistance;Sitting  Lower Body Dressing Moderate assistance;Sit to/from Retail buyer Stand-pivot;Minimal assistance  Toileting- Clothing Manipulation and Hygiene Minimal assistance;Sit to/from stand  Perception  Perception Tested? Yes  Perception Deficits Inattention/neglect (inattentive to R side when ambulating, head turned to L)  Bed Mobility  Overal bed mobility Needs Assistance  Bed Mobility Sit to Supine  Supine to sit Supervision  General bed mobility comments increased time and use of rail  Transfers  Overall transfer level Needs assistance  Equipment used Rolling walker (2 wheeled)  Transfers Sit to/from Stand  Sit to Stand Min guard  Stand pivot transfers Min guard  OT - End of Session  Equipment Utilized During Treatment Gait belt;Rolling walker  Activity Tolerance Patient tolerated treatment well  Patient left in chair;with call bell/phone within reach;with family/visitor present  Nurse Communication (aware there is no chair alarm box, pad under pt)  OT Assessment  OT Therapy Diagnosis  Generalized weakness;Cognitive deficits;Altered mental status  OT Recommendation/Assessment Patient needs continued OT Services  OT Problem List Decreased strength;Decreased activity tolerance;Impaired balance (sitting  and/or standing);Decreased cognition  OT Plan  OT Frequency (ACUTE ONLY) Min 2X/week  OT Treatment/Interventions (ACUTE ONLY) Self-care/ADL training;DME and/or AE instruction;Cognitive remediation/compensation;Patient/family education;Balance training  OT Recommendation  Follow Up Recommendations SNF;Supervision/Assistance - 24 hour  OT Equipment None recommended by  OT  Individuals Consulted  Consulted and Agree with Results and Recommendations Family member/caregiver  Family Member Consulted husband  Acute Rehab OT Goals  Patient Stated Goal husband would like pt to have rehab prior to return home  OT Goal Formulation With patient/family  Time For Goal Achievement 07/25/15  Potential to Achieve Goals Good  OT Time Calculation  OT Start Time (ACUTE ONLY) 1443  OT Stop Time (ACUTE ONLY) 1512  OT Time Calculation (min) 29 min  OT General Charges  $OT Visit 1 Procedure  OT Evaluation  $OT Re-eval 1 Procedure  Written Expression  Dominant Hand Right  07/18/2015 Nestor Lewandowsky, OTR/L Pager: [phone]

## 2015-07-18 NOTE — Progress Notes (Signed)
[facility] PROGRESS NOTE  [name] XBD:[id number] DOB: August 02, 1936 DOA: 07/14/2015 PCP: Redge Gainer, MD  Assessment/Plan: 79 y/o female with HTN, DM with gastroparesis, h/o CVA, CKD,stage III, monoclonal gammopathy, h/o diverticulitis with resection of most of her colon with ileostomy in place, fatty liver, elevated liver enzymes, followed by Dr. Hilarie Fredrickson for nausea who presents with chronic abdominal pain and nausea for about 2 months. CT abdomen pelvis without contrast negative for obstruction. Underwent EGD: Normal esophagus. Too many to count gastric polyps. No evidence of GOO or PUD. And recommended head MRI due to intractable nausea. Patient also noted to have some confusion. MRI showed acute infarction affect the LEFT frontal subcortical white matter, and thalamic infarcts. -patient transferred to [facility] for further neuro work up   1. Acute CVA. MRI head: Multifocal areas of non hemorrhagic acute infarction affect the LEFTfrontal subcortical white matter. Advanced chronic microvascular ischemic change with generalized moderate atrophy. Areas of chronic ischemia as described, most notable being the LEFT thalamic hemorrhagic lacunar infarct -patient is seen by neurology, awaiting further work up with MRV, MRA, Carotids. Cont ASA. F/u lipid panel eval for statins   2. Acute on chronic kidney failure. likely secondary to dehydration, improving, creatinine back to baseline. discontinued IVF, monitor if able to cont oral hydration adequately prior to discharge 3. Nausea likely multifactorial. (new CVA+gastroparesis). GI has recommended to place the patient on scheduled doses of Zofran, Reglan, GI started the patient on IV erythromycin, now switched to by mouth erythromycin, status post EGD that showed multiple gastric polyps , no gastric outlet obstruction, appreciate GI input. We ill d/c IV scheduled metoclopramide due to acute CVA, change in mental status. Use as needed oral only  4.  Essential hypertension. Continue scheduled hydralazine. Continue Norvasc and metoprolol. Titrate as needed. Mild permissive HTN 24-48 hrs  5. Abnormal troponin. slight elevation in the setting of renal failure, now 0.04 and unchanged 3, peak value was 0.09 suspect that this is likely in the setting of renal failure and does not reflect any cardiac process, 2-D echo does not show any regional wall motion abnormalities, EF 55-60% 6. Ileostomy secondary to diverticulitis-no change in ostomy output since admission 7. Diabetes mellitus, started on D5, now switched to normal saline, hemoglobin A1c 6.9, check Accu-Cheks every 4 hours and sliding scale insulin, patient uses Humalog 50/50 at home. 8. Recent UTI-UA negative this admission   Code Status: full Family Communication: d/w patient. No family at the bedside  (indicate person spoken with, relationship, and if by phone, the number) Disposition Plan: pend neurology work up. Needs SNF   Consultants:  Neurology. GI   Procedures:  EGD  EchoStudy Conclusions  - Left ventricle: The cavity size was mildly dilated. There was moderate concentric hypertrophy. Systolic function was normal. The estimated ejection fraction was in the range of 55% to 60%. Wall motion was normal; there were no regional wall motion abnormalities. - Aortic valve: There was trivial regurgitation. - Left atrium: The atrium was mildly dilated. - Atrial septum: No defect or patent foramen ovale was identified.   Antibiotics:  Erythromycin  (indicate start date, and stop date if known)  HPI/Subjective: Alert, mild aphasia   Objective: Filed Vitals:   07/18/15 0619  BP: 161/65  Pulse: 90  Temp: 98.2 F (36.8 C)  Resp: 20    Intake/Output Summary (Last 24 hours) at 07/18/15 0903 Last data filed at 07/17/15 1305  Gross per 24 hour  Intake     60 ml  Output    325 ml  Net   -265 ml   Filed Weights   07/14/15 2048 07/16/15 0805 07/18/15 0237   Weight: 81.738 kg (180 lb 3.2 oz) 81.647 kg (180 lb) 83.9 kg (184 lb 15.5 oz)    Exam:   General:  No distress   Cardiovascular: s1,s2 rrr  Respiratory: CTA BL  Abdomen: soft, nt  Musculoskeletal: no leg edema   Data Reviewed: Basic Metabolic Panel:  Recent Labs Lab 07/14/15 1435 07/14/15 1854 07/15/15 0119 07/16/15 0506 07/17/15 0540  NA 134*  --  134* 140 139  K 4.9  --  4.6 4.4 4.3  CL 108  --  112* 118* 116*  CO2 16*  --  13* 15* 15*  GLUCOSE 70  --  237* 154* 179*  BUN 33*  --  30* 23* 18  CREATININE 3.37* 3.24* 3.01* 2.48* 2.27*  CALCIUM 9.5  --  8.5* 8.6* 8.5*  MG  --  2.2  --   --   --    Liver Function Tests:  Recent Labs Lab 07/14/15 1435 07/15/15 0119 07/16/15 0506 07/17/15 0540  AST 51* 48* 45* 60*  ALT 37 31 27 30   ALKPHOS 172* 139* 119 117  BILITOT 0.4 0.3 0.7 0.5  PROT 7.8 6.2* 5.7* 5.8*  ALBUMIN 3.7 3.0* 2.8* 2.7*    Recent Labs Lab 07/14/15 1435  LIPASE 13*   No results for input(s): AMMONIA in the last 168 hours. CBC:  Recent Labs Lab 07/14/15 1435 07/15/15 0119 07/17/15 0540  WBC 9.9 6.9 7.8  HGB 13.0 11.2* 9.5*  HCT 38.6 33.7* 29.1*  MCV 95.1 94.1 94.2  PLT 221 169 163   Cardiac Enzymes:  Recent Labs Lab 07/14/15 1854 07/15/15 0119 07/15/15 0801  TROPONINI 0.04* 0.04* 0.04*   BNP (last 3 results)  Recent Labs  03/09/15 0224  BNP 257.8*    ProBNP (last 3 results) No results for input(s): PROBNP in the last 8760 hours.  CBG:  Recent Labs Lab 07/17/15 0725 07/17/15 1112 07/17/15 1625 07/17/15 2140 07/18/15 0801  GLUCAP 182* 169* 210* 186* 165*    Recent Results (from the past 240 hour(s))  MRSA PCR Screening     Status: None   Collection Time: 07/17/15 10:43 AM  Result Value Ref Range Status   MRSA by PCR NEGATIVE NEGATIVE Final    Comment:        The GeneXpert MRSA Assay (FDA approved for NASAL specimens only), is one component of a comprehensive MRSA colonization surveillance program.  It is not intended to diagnose MRSA infection nor to guide or monitor treatment for MRSA infections.      Studies: Ct Head Wo Contrast  07/16/2015   CLINICAL DATA:  Nausea and headaches.  EXAM: CT HEAD WITHOUT CONTRAST  TECHNIQUE: Contiguous axial images were obtained from the base of the skull through the vertex without intravenous contrast.  COMPARISON:  10/20/2009  FINDINGS: Stable age related cerebral atrophy, ventriculomegaly and periventricular white matter disease. Remote lacunar-type basal ganglia infarcts are noted. No extra-axial fluid collections are identified. No CT findings for acute hemispheric infarction or intracranial hemorrhage. No mass lesions. The brainstem and cerebellum are normal.  The bony structures are intact. No skull fracture or bone lesion. The paranasal sinuses and mastoid air cells are grossly clear. The globes are intact.  IMPRESSION: Chronic changes in the brain but no acute findings.   Electronically Signed   By: Marijo Sanes M.D.   On: 07/16/2015 14:43  Mr Brain Wo Contrast  07/17/2015   CLINICAL DATA:  Headaches with confusion and unsteady gait. Stroke risk factors include diabetes, hypertension, and prior stroke. Also history of monoclonal gammopathy.  EXAM: MRI HEAD WITHOUT CONTRAST  TECHNIQUE: Multiplanar, multiecho pulse sequences of the brain and surrounding structures were obtained without intravenous contrast.  COMPARISON:  CT head 07/16/2015.  MR brain 10/21/2009.  FINDINGS: Multifocal areas of restricted diffusion, both punctate and confluent, affecting the LEFT frontal subcortical white matter, representing acute infarction. No hemorrhage, mass lesion, or extra-axial fluid.  Moderate cerebral and cerebellar atrophy. Extensive T2 and FLAIR hyperintensities throughout the white matter, likely small vessel disease. Remote hemorrhagic infarct LEFT thalamus. Roughly symmetric T2 hyperintensities of the globus pallidum, nonspecific. Prominent perivascular  spaces, sequelae of hypertension.  Flow voids are maintained. RIGHT vertebral is dominant. Normal pituitary and cerebellar tonsils. Significant cervical spondylosis at C3-4 is suspected.  Extracranial soft tissues show no significant finding. No concerning osseous lesions to suggest a marrow replacement process. Asymmetric and poorly aerated RIGHT mastoid with noted previously, stable.  Compared with prior MR, there is progression of atrophy and small vessel disease. The LEFT thalamic hemorrhage has developed since 2010.  IMPRESSION: Multifocal areas of non hemorrhagic acute infarction affect the LEFT frontal subcortical white matter.  Advanced chronic microvascular ischemic change with generalized moderate atrophy.  Areas of chronic ischemia as described, most notable being the LEFT thalamic hemorrhagic lacunar infarct.   Electronically Signed   By: Staci Righter M.D.   On: 07/17/2015 18:40    Scheduled Meds: . amLODipine  10 mg Oral Daily  . aspirin EC  81 mg Oral Daily  . erythromycin  250 mg Oral TID WC & HS  . heparin subcutaneous  5,000 Units Subcutaneous 3 times per day  . hydrALAZINE  50 mg Oral 3 times per day  . insulin aspart  0-9 Units Subcutaneous TID WC  . levothyroxine  50 mcg Oral QAC breakfast  . metoCLOPramide (REGLAN) injection  5 mg Intravenous 4 times per day  . metoprolol tartrate  25 mg Oral BID  . ondansetron  8 mg Oral Q8H  . pantoprazole (PROTONIX) IV  40 mg Intravenous Q12H  . vitamin B-6  50 mg Oral Daily  . sodium chloride  3 mL Intravenous Q12H   Continuous Infusions:   Principal Problem:   Acute on chronic kidney failure Active Problems:   Essential hypertension   Chronic kidney disease   CVA (cerebral infarction), past history   Ileostomy secondary to diverticulitis   Gastroparesis due to DM   Acute renal failure superimposed on stage 4 chronic kidney disease   Malnutrition of moderate degree   Gastric polyp   Nausea with vomiting    Time spent: >35  minutes     Kinnie Feil  [facility] Pager [phone]. If 7PM-7AM, please contact night-coverage at [email or web address], password [facility] 07/18/2015, 9:03 AM  LOS: 4 days

## 2015-07-18 NOTE — Evaluation (Signed)
Speech Language Pathology Evaluation Patient Details Name: [name] MRN: [id number] DOB: 1936-02-17 Today's Date: 07/18/2015 Time: 4098-1191 SLP Time Calculation (min) (ACUTE ONLY): 30 min  Problem List:  Patient Active Problem List   Diagnosis Date Noted  . HLD (hyperlipidemia)   . Type 2 diabetes mellitus with other circulatory complications   . Gastric polyp   . Nausea with vomiting   . Malnutrition of moderate degree 07/15/2015  . Acute on chronic kidney failure 07/14/2015  . Acute renal failure superimposed on stage 4 chronic kidney disease 07/14/2015  . Precordial chest pain 05/21/2015  . Gastroparesis due to DM 01/16/2015  . Gastric polyps 10/11/2014  . MGUS (monoclonal gammopathy of unknown significance) 10/10/2014  . Proteinuria 09/23/2014  . Vitamin D insufficiency 03/29/2013  . Chronic kidney disease 09/14/2011  . CVA (cerebral infarction), past history 09/14/2011  . 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 DIABETES MELLITUS, TYPE II 08/12/2008  . HYPERLIPIDEMIA 08/12/2008  . Essential hypertension 08/12/2008  . GERD 08/12/2008  . OSTEOARTHRITIS, lumbar spine 08/12/2008  . Osteoporosis, postmenopausal 08/12/2008  . KNEE REPLACEMENT, RIGHT, HX OF 08/12/2008   Past Medical History:  Past Medical History  Diagnosis Date  . Diverticulitis 2005    Colonoscopy--Dr. Sharlett Iles   . Diabetes mellitus without complication   . Hyperlipidemia   . Hypertension   . Stroke     Past history  . Hepatic steatosis   . Anemia   . Arthritis   . Chronic kidney disease (CKD)     past dialysis  . Status post dilation of esophageal narrowing   . MRSA infection 07/2009  . Proteinuria 09/23/2014  . MGUS (monoclonal gammopathy of unknown significance) 10/10/2014  . Hyperplastic colon polyp   . Fundic gland polyps of stomach, benign   . GERD (gastroesophageal reflux disease)   . Gastroparesis   . CVA (cerebral vascular accident)    Past Surgical History:   Past Surgical History  Procedure Laterality Date  . Bilateral oophorectomy    . Rotator cuff repair Bilateral   . Carpal tunnel repair Bilateral   . Laparoscopic colostomy      for diverticulitis  . Nasal sinus surgery    . Tonsillectomy    . Replacement total knee Right   . Abdominal hernia repair      with ilestomy  . Partial colectomy    . Esophagogastroduodenoscopy N/A 07/16/2015    Procedure: ESOPHAGOGASTRODUODENOSCOPY (EGD);  Surgeon: Manus Gunning, MD;  Location: [facility];  Service: Gastroenterology;  Laterality: N/A;   HPI:  79 yo female admitted with acute on chronic kidney failure, nausea, gastroparesis. Pt with increased lethargy, confusion and difficulty with word finding on 9/15 with MRI consistent with R frontal infarct. Pt was [facility] from [facility] to [facility]. Hx of CVA, stage IV kidney disease, HTN, DM   Assessment / Plan / Recommendation Clinical Impression  Patient presents with a moderate receptive and expressive aphasia characterized by processing delays in answering open-ended questions, perseveration without awareness on words/thoughts, inability to answer What questions, inconsistency and delays when making a choice (ie: when asked if she wanted a diet or regular coke, she had extreme difficulty in initiating and responding). Patient is consistent with answering biographical, personal yes/no questions, and is more accurate with answering 2-choice questions than in answering open-ended questions (eventually able to confirm that she had a child who is a 'boy' and not 'girl'. Patient's spouse confirmed that although he helps her with things in the house, it is not  normal for her to present as she is today, with a generally flat affect, processing delays, delays in initiation, perseveration and difficulty with answering open-ended basic-level questions     SLP Assessment  Patient needs continued Speech Lanaguage Pathology Services    Follow Up Recommendations   Home health SLP;Inpatient Rehab;Skilled Nursing facility    Frequency and Duration min 2x/week  2 weeks   Pertinent Vitals/Pain Pain Assessment: No/denies pain   SLP Goals  Potential to Achieve Goals (ACUTE ONLY): Good  SLP Evaluation Prior Functioning  Cognitive/Linguistic Baseline: Within functional limits Type of Home: House Available Help at Discharge: Family;Available 24 hours/day   Cognition  Overall Cognitive Status: Impaired/Different from baseline Arousal/Alertness: Awake/alert Orientation Level: Oriented to person;Oriented to place;Oriented to situation;Disoriented to time Attention: Sustained Sustained Attention: Impaired Sustained Attention Impairment: Verbal basic Awareness: Impaired Awareness Impairment: Emergent impairment Executive Function: Organizing;Initiating;Self Monitoring Organizing: Impaired Organizing Impairment: Verbal basic Initiating: Impaired Initiating Impairment: Verbal basic Self Monitoring: Impaired Self Monitoring Impairment: Verbal basic;Verbal complex Behaviors: Perseveration    Comprehension  Auditory Comprehension Overall Auditory Comprehension: Impaired Yes/No Questions: Within Functional Limits Commands: Impaired Two Step Basic Commands: 75-100% accurate Multistep Basic Commands: 50-74% accurate Complex Commands: Not tested Conversation: Simple Interfering Components: Attention;Processing speed EffectiveTechniques: Extra processing time;Repetition    Expression Expression Primary Mode of Expression: Verbal Verbal Expression Overall Verbal Expression: Impaired Initiation: Impaired Level of Generative/Spontaneous Verbalization: Word;Phrase Repetition: No impairment Naming: No impairment Pragmatics: Impairment Impairments: Abnormal affect Interfering Components: Attention Effective Techniques: Open ended questions;Semantic cues Non-Verbal Means of Communication: Not applicable Written Expression Dominant Hand: Right    Oral / Motor Oral Motor/Sensory Function Overall Oral Motor/Sensory Function: Appears within functional limits for tasks assessed Motor Speech Overall Motor Speech: Appears within functional limits for tasks assessed   GO     Dannial Monarch 07/18/2015, 5:22 PM  Sonia Baller, MA, CCC-SLP 07/18/2015 5:22 PM

## 2015-07-18 NOTE — Consult Note (Signed)
Neurology Consultation Reason for Consult: Stroke Referring Physician: Derrek Gu  CC: confusion  History is obtained from: Patient  HPI: [name] is a 79 y.o. female he was admitted on 9/12 with intractable nausea and vomiting. She was also complaining of headache. She has a history of gastroparesis. GI was consulted and she had an EGD. For the past day she has had some confusion with mild word finding difficulty. This morning, per the IM note she was barely arousable and therefore an MRI was obtained which did show some diffusion change in the right frontal region consistent with infarct.   LKW: 9/12 tpa given?: no, out of window    ROS: A 14 point ROS was performed and is negative except as noted in the HPI.   Past Medical History  Diagnosis Date  . Diverticulitis 2005    Colonoscopy--Dr. Sharlett Iles   . Diabetes mellitus without complication   . Hyperlipidemia   . Hypertension   . Stroke     Past history  . Hepatic steatosis   . Anemia   . Arthritis   . Chronic kidney disease (CKD)     past dialysis  . Status post dilation of esophageal narrowing   . MRSA infection 07/2009  . Proteinuria 09/23/2014  . MGUS (monoclonal gammopathy of unknown significance) 10/10/2014  . Hyperplastic colon polyp   . Fundic gland polyps of stomach, benign   . GERD (gastroesophageal reflux disease)   . Gastroparesis   . CVA (cerebral vascular accident)      Family History  Problem Relation Age of Onset  . Diabetes Mother   . Heart disease Father 68    Died after bypass  . Diabetes Sister   . Colon cancer Neg Hx      Social History:  reports that she has never smoked. She has never used smokeless tobacco. She reports that she does not drink alcohol or use illicit drugs.   Exam: Current vital signs: BP 144/93 mmHg  Pulse 85  Temp(Src) 98.1 F (36.7 C) (Oral)  Resp 20  Ht 5\' 1"  (1.549 m)  Wt 81.647 kg (180 lb)  BMI 34.03 kg/m2  SpO2 98% Vital signs in last 24  hours: Temp:  [98.1 F (36.7 C)-98.4 F (36.9 C)] 98.1 F (36.7 C) (09/16 0131) Pulse Rate:  [73-91] 85 (09/16 0131) Resp:  [20] 20 (09/16 0131) BP: (144-170)/(49-93) 144/93 mmHg (09/16 0131) SpO2:  [97 %-100 %] 98 % (09/16 0131)  Physical Exam  Constitutional: Appears well-developed and well-nourished.  Psych: Affect appropriate to situation Eyes: No scleral injection HENT: No OP obstrucion Head: Normocephalic.  Cardiovascular: Normal rate and regular rhythm.  Respiratory: Mild wheezing GI: Soft.  No distension. There is no tenderness.  Skin: WDI  Neuro: Mental Status: Patient is awake, alert, oriented to person, place, month, unable to give year Patient is able to give a clear and coherent history. She is not having any clear aphasia, but does have some mild latency of speech which could be a mild expressive. Aphasia she is able to repeat Cranial Nerves: II: Visual Fields are full. Pupils are equal, round, and reactive to light.   III,IV, VI: EOMI without ptosis or diploplia.  V: Facial sensation is symmetric to temperature VII: Facial movement is symmetric.  VIII: hearing is intact to voice X: Uvula elevates symmetrically XI: Shoulder shrug is symmetric. XII: tongue is midline without atrophy or fasciculations.  Motor: Tone is normal. Bulk is normal. 5/5 strength was present in all four  extremities though some pain limits left lower extremity Sensory: Sensation is symmetric to light touch and temperature in the arms and legs. Cerebellar: No clear ataxia  I have reviewed labs in epic and the results pertinent to this consultation are: Elevated creatinine Elevated glucose  I have reviewed the images obtained: MRI brain-some diffusion change the left frontal white matter  Impression: 79 year old female with white matter infarction. With her headaches and nausea, anything could be reasonable to get an MR venogram as well as MR angiogram to assess for pathology new this  region. She does have a long-standing history of poorly controlled diabetes and hypertension which could be contributing.  Recommendations: 1. HgbA1c, fasting lipid panel 2. MRV, MRA  of the brain without contrast 3. Frequent neuro checks 4. Carotid dopplers 5. Prophylactic therapy-Antiplatelet med: Aspirin - dose 325mg  PO or 300mg  PR 6. Risk factor modification 7. Telemetry monitoring 8. PT consult, OT consult   Roland Rack, MD [facility] [phone]  If 7pm- 7am, please page neurology on call as listed in [place].

## 2015-07-18 NOTE — Progress Notes (Signed)
Pt arrived on unit alert and was oriented to room.  Questions answered, call bell at side, bed alarm activated. Will continue to monitor. Hassan Rowan, RN

## 2015-07-18 NOTE — Progress Notes (Signed)
Pt transferred to [place] via carelink. Report was called to [facility], RN on [facility].

## 2015-07-18 NOTE — Progress Notes (Signed)
Daily Rounding Note  07/18/2015, 11:40 AM  LOS: 4 days   SUBJECTIVE:       Tolerated a bit of her breakfast this morning, ate about 25%. Currently tucking into her lunch and eating with some vigor.  There's been no nausea today. No abdominal pain.  OBJECTIVE:         Vital signs in last 24 hours:    Temp:  [97.5 F (36.4 C)-98.4 F (36.9 C)] 97.8 F (36.6 C) (09/16 1008) Pulse Rate:  [67-90] 67 (09/16 1008) Resp:  [20] 20 (09/16 1008) BP: (144-181)/(49-93) 144/51 mmHg (09/16 1008) SpO2:  [97 %-98 %] 98 % (09/16 1008) Weight:  [184 lb 15.5 oz (83.9 kg)] 184 lb 15.5 oz (83.9 kg) (09/16 0237) Last BM Date: 07/16/15 Filed Weights   07/14/15 2048 07/16/15 0805 07/18/15 0237  Weight: 180 lb 3.2 oz (81.738 kg) 180 lb (81.647 kg) 184 lb 15.5 oz (83.9 kg)   General: Patient observed but not reexamined. No distress, no respiratory difficulty.    Intake/Output from previous day: 09/15 0701 - 09/16 0700 In: 120 [P.O.:120] Out: 325 [Urine:200; Stool:125]  Intake/Output this shift:    Lab Results:  Recent Labs  07/17/15 0540  WBC 7.8  HGB 9.5*  HCT 29.1*  PLT 163   BMET  Recent Labs  07/16/15 0506 07/17/15 0540  NA 140 139  K 4.4 4.3  CL 118* 116*  CO2 15* 15*  GLUCOSE 154* 179*  BUN 23* 18  CREATININE 2.48* 2.27*  CALCIUM 8.6* 8.5*   LFT  Recent Labs  07/16/15 0506 07/17/15 0540  PROT 5.7* 5.8*  ALBUMIN 2.8* 2.7*  AST 45* 60*  ALT 27 30  ALKPHOS 119 117  BILITOT 0.7 0.5   PT/INR No results for input(s): LABPROT, INR in the last 72 hours. Hepatitis Panel No results for input(s): HEPBSAG, HCVAB, HEPAIGM, HEPBIGM in the last 72 hours.  Studies/Results: Ct Head Wo Contrast  07/16/2015   CLINICAL DATA:  Nausea and headaches.  EXAM: CT HEAD WITHOUT CONTRAST  TECHNIQUE: Contiguous axial images were obtained from the base of the skull through the vertex without intravenous contrast.  COMPARISON:   10/20/2009  FINDINGS: Stable age related cerebral atrophy, ventriculomegaly and periventricular white matter disease. Remote lacunar-type basal ganglia infarcts are noted. No extra-axial fluid collections are identified. No CT findings for acute hemispheric infarction or intracranial hemorrhage. No mass lesions. The brainstem and cerebellum are normal.  The bony structures are intact. No skull fracture or bone lesion. The paranasal sinuses and mastoid air cells are grossly clear. The globes are intact.  IMPRESSION: Chronic changes in the brain but no acute findings.   Electronically Signed   By: Marijo Sanes M.D.   On: 07/16/2015 14:43   Mr Brain Wo Contrast  07/17/2015   CLINICAL DATA:  Headaches with confusion and unsteady gait. Stroke risk factors include diabetes, hypertension, and prior stroke. Also history of monoclonal gammopathy.  EXAM: MRI HEAD WITHOUT CONTRAST  TECHNIQUE: Multiplanar, multiecho pulse sequences of the brain and surrounding structures were obtained without intravenous contrast.  COMPARISON:  CT head 07/16/2015.  MR brain 10/21/2009.  FINDINGS: Multifocal areas of restricted diffusion, both punctate and confluent, affecting the LEFT frontal subcortical white matter, representing acute infarction. No hemorrhage, mass lesion, or extra-axial fluid.  Moderate cerebral and cerebellar atrophy. Extensive T2 and FLAIR hyperintensities throughout the white matter, likely small vessel disease. Remote hemorrhagic infarct LEFT thalamus. Roughly symmetric T2 hyperintensities  of the globus pallidum, nonspecific. Prominent perivascular spaces, sequelae of hypertension.  Flow voids are maintained. RIGHT vertebral is dominant. Normal pituitary and cerebellar tonsils. Significant cervical spondylosis at C3-4 is suspected.  Extracranial soft tissues show no significant finding. No concerning osseous lesions to suggest a marrow replacement process. Asymmetric and poorly aerated RIGHT mastoid with noted  previously, stable.  Compared with prior MR, there is progression of atrophy and small vessel disease. The LEFT thalamic hemorrhage has developed since 2010.  IMPRESSION: Multifocal areas of non hemorrhagic acute infarction affect the LEFT frontal subcortical white matter.  Advanced chronic microvascular ischemic change with generalized moderate atrophy.  Areas of chronic ischemia as described, most notable being the LEFT thalamic hemorrhagic lacunar infarct.   Electronically Signed   By: Staci Righter M.D.   On: 07/17/2015 18:40   Scheduled Meds: . amLODipine  10 mg Oral Daily  . aspirin EC  81 mg Oral Daily  . erythromycin  250 mg Oral TID WC & HS  . heparin subcutaneous  5,000 Units Subcutaneous 3 times per day  . hydrALAZINE  50 mg Oral 3 times per day  . insulin aspart  0-9 Units Subcutaneous TID WC  . levothyroxine  50 mcg Oral QAC breakfast  . metoprolol tartrate  25 mg Oral BID  . ondansetron  8 mg Oral Q8H  . pantoprazole (PROTONIX) IV  40 mg Intravenous Q12H  . vitamin B-6  50 mg Oral Daily  . sodium chloride  3 mL Intravenous Q12H   Continuous Infusions:  PRN Meds:.acetaminophen, albuterol, guaiFENesin, hydrALAZINE, metoCLOPramide  ASSESMENT:   *  Nausea, weight loss. History gastroparesis.  History of fundal gland and hyperplastic gastric polyps 09/2014. 07/03/15 EGD:  TNTC gastric polyps. All sized less than 1 cm, few of largest polyp removed. Ampulla slightly irregular, biopsied. None of these findings and explain the nausea. Polyp pathology show fundic gland polyp. Gastric biopsy show mild chronic gastritis. Duodenal biopsies reveal benign duodenal mucosa IV erythromycin conferring no benefit, she is receiving scheduled oral erythromycin..  Scheduled Zofran IV metoclopramide discontinued due to CNS changes, prn metoclopramide by mouth still available  *  Acute CVA CT of the head showed chronic microvascular ischemia, moderate atrophy, multifocal areas of non-hemorrhagic acute  infarct in the left frontal region.  Patient has just been visited by the neurologist.  *  Previous colectomy and ostomy, functioning well.   PLAN   *  We'll switch to oral Protonix *  GI signing off.  Call if questions. Follow-up prn with Dr Oretha Ellis    Azucena Freed  07/18/2015, 11:40 AM Pager: [phone]   Attending Addendum: I agree with the Advanced Practitioner's note and impression. Patient sleeping today, I spoke with the husband at length. She was transferred to [facility] last night when CVA noted on MRI. She had complained of headaches recently and CT head was negative, but then developed neurologic deficits. While she does have gastroparesis which is likely playing a role in some of her GI symptoms, her CNS process is likely playing a large role as well and can cause severe nausea. CT abdomen and EGD unremarkable regarding outflow obstruction. I discussed the pathology results with the patient - benign fundic gland polyps, no H pylori, negative biopsies of the ampulla. Neurology now working with the patient. In regards to gastroparesis, recommend small frequent meals. Would hold reglan in light of neurologic issues, and pharmacy put her on oral erythromycin due to hospital shortage of IV erythromycin. It did not  seem to help anyway and oral form is not effective for gastroparesis so will stop it. I would recommend scheduled zofran / antiemetics if nausea continues to bother her. Will sign off for now, please call with questions / concerns moving forward from GI perspective.   [place] Cellar, MD [facility] Pager [phone]

## 2015-07-18 NOTE — Progress Notes (Addendum)
*  PRELIMINARY RESULTS* Vascular Ultrasound Carotid Duplex (Doppler) has been completed.   Findings suggest 1-39% internal carotid artery stenosis bilaterally. Vertebral arteries are patent with antegrade flow.   Bilateral lower extremity venous duplex completed. Bilateral lower extremities are negative for deep vein thrombosis. There is no evidence of Baker's cyst bilaterally.  07/18/2015  Maudry Mayhew, RVT, RDCS, RDMS

## 2015-07-18 NOTE — Progress Notes (Signed)
STROKE TEAM PROGRESS NOTE  HPI [name] is a 79 y.o. female he was admitted on 9/12 with intractable nausea and vomiting. She was also complaining of headache. She has a history of gastroparesis. GI was consulted and she had an EGD. For the past day she has had some confusion with mild word finding difficulty. This morning, per the IM note she was barely arousable and therefore an MRI was obtained which did show some diffusion change in the right frontal region consistent with infarct.   LKW: 9/12 tpa given?: no, out of window   SUBJECTIVE (INTERVAL HISTORY) The patient is on isolation precautions. Her husband was at the bedside. She is more awake and alert, orientated. Less confused as before. Moving all extremities. No nausea vomiting.   OBJECTIVE Temp:  [97.5 F (36.4 C)-98.4 F (36.9 C)] 98.2 F (36.8 C) (09/16 0619) Pulse Rate:  [73-90] 90 (09/16 0619) Cardiac Rhythm:  [-] Normal sinus rhythm (09/16 0233) Resp:  [20] 20 (09/16 0619) BP: (144-181)/(49-93) 161/65 mmHg (09/16 0619) SpO2:  [97 %-98 %] 98 % (09/16 0619) Weight:  [83.9 kg (184 lb 15.5 oz)] 83.9 kg (184 lb 15.5 oz) (09/16 0237)  CBC:  Recent Labs Lab 07/15/15 0119 07/17/15 0540  WBC 6.9 7.8  HGB 11.2* 9.5*  HCT 33.7* 29.1*  MCV 94.1 94.2  PLT 169 629    Basic Metabolic Panel:  Recent Labs Lab 07/14/15 1854  07/16/15 0506 07/17/15 0540  NA  --   < > 140 139  K  --   < > 4.4 4.3  CL  --   < > 118* 116*  CO2  --   < > 15* 15*  GLUCOSE  --   < > 154* 179*  BUN  --   < > 23* 18  CREATININE 3.24*  < > 2.48* 2.27*  CALCIUM  --   < > 8.6* 8.5*  MG 2.2  --   --   --   < > = values in this interval not displayed.  Lipid Panel:    Component Value Date/Time   CHOL 380* 06/10/2015 0956   CHOL 303* 01/16/2015 0937   TRIG 384* 06/10/2015 0956   TRIG 324* 01/16/2015 0937   HDL 57 06/10/2015 0956   HDL 60 01/16/2015 0937   HDL 45 10/23/2009 0507   CHOLHDL 6.7* 06/10/2015 0956   CHOLHDL 3.0  10/23/2009 0507   VLDL 50* 10/23/2009 0507   LDLCALC 246* 06/10/2015 0956   LDLCALC Comment 05/02/2014 0906   LDLCALC  10/23/2009 0507    39        Total Cholesterol/HDL:CHD Risk Coronary Heart Disease Risk Table                     Men   Women  1/2 Average Risk   3.4   3.3  Average Risk       5.0   4.4  2 X Average Risk   9.6   7.1  3 X Average Risk  23.4   11.0        Use the calculated Patient Ratio above and the CHD Risk Table to determine the patient's CHD Risk.        ATP III CLASSIFICATION (LDL):  <100     mg/dL   Optimal  100-129  mg/dL   Near or Above                    Optimal  130-159  mg/dL   Borderline  160-189  mg/dL   High  >190     mg/dL   Very High   HgbA1c:  Lab Results  Component Value Date   HGBA1C 6.9* 07/14/2015   Urine Drug Screen: No results found for: LABOPIA, COCAINSCRNUR, LABBENZ, AMPHETMU, THCU, LABBARB    IMAGING  Ct Head Wo Contrast 07/16/2015    Chronic changes in the brain but no acute findings.     Mr Brain Wo Contrast 07/17/2015    Multifocal areas of non hemorrhagic acute infarction affect the LEFT frontal subcortical white matter.  Advanced chronic microvascular ischemic change with generalized moderate atrophy.  Areas of chronic ischemia as described, most notable being the LEFT thalamic hemorrhagic lacunar infarct.     LE venous Doppler - negative for DVT  Carotid Doppler - Bilateral: 1-39% ICA stenosis. Vertebral artery flow is antegrade.  2-D echo - - Left ventricle: The cavity size was mildly dilated. There was moderate concentric hypertrophy. Systolic function was normal. The estimated ejection fraction was in the range of 55% to 60%. Wall motion was normal; there were no regional wall motion abnormalities. - Aortic valve: There was trivial regurgitation. - Left atrium: The atrium was mildly dilated. - Atrial septum: No defect or patent foramen ovale was identified.  MRA and MRV pending   PHYSICAL  EXAM Physical exam  Temp:  [97.5 F (36.4 C)-98.4 F (36.9 C)] 97.8 F (36.6 C) (09/16 1008) Pulse Rate:  [67-90] 67 (09/16 1008) Resp:  [20] 20 (09/16 1008) BP: (144-181)/(51-93) 144/51 mmHg (09/16 1008) SpO2:  [97 %-98 %] 98 % (09/16 1008) Weight:  [184 lb 15.5 oz (83.9 kg)] 184 lb 15.5 oz (83.9 kg) (09/16 0237)  General - Well nourished, well developed, in no apparent distress.  Ophthalmologic - Fundi not visualized due to eye movement.  Cardiovascular - Regular rate and rhythm with no murmur.  Mental Status -  Level of arousal and orientation to time, place, and person were intact. Language including expression, naming, repetition, comprehension was assessed and found intact. Fund of Knowledge was assessed and was impaired.  Cranial Nerves II - XII - II - Visual field intact OU. III, IV, VI - Extraocular movements intact. V - Facial sensation intact bilaterally. VII - Facial movement intact bilaterally. VIII - Hearing & vestibular intact bilaterally. X - Palate elevates symmetrically. XI - Chin turning & shoulder shrug intact bilaterally. XII - Tongue protrusion intact.  Motor Strength - The patient's strength was normal in all extremities and pronator drift was absent.  Bulk was normal and fasciculations were absent.   Motor Tone - Muscle tone was assessed at the neck and appendages and was normal.  Reflexes - The patient's reflexes were 1+ in all extremities and she had no pathological reflexes.  Sensory - Light touch, temperature/pinprick were assessed and were symmetrical.    Coordination - The patient had normal movements in the hands and feet with no ataxia or dysmetria.  Tremor was absent.  Gait and Station - deferred due to fatigue and just woke up from sleep.   ASSESSMENT/PLAN Ms. [name] is a 79 y.o. female with history of diabetes mellitus, hyperlipidemia, hypertension, previous stroke, chronic kidney disease, gastroparesis, and recent nausea  vomiting and headache presenting with confusion, word finding difficulties, and decreased level of responsiveness.  She did not receive IV t-PA due to late presentation.  Stroke:  Acute left subcortical white matter punctate infarcts, resembling watershed area stroke, probably small vessel  events vs. hypotension.  Resultant  resolving deficit  MRI  left subcortical white matter punctate infarcts, watershed distribution  MRA  Pending  MRV pending  Carotid Doppler unremarkable  Lower extremity venous Dopplers - negative for DVT  2D Echo EF 55-60%. No cardiac source of emboli identified.  LDL 246  HgbA1c 6.9   VTE prophylaxis - subcutaneous heparin  Diet heart healthy/carb modified Room service appropriate?: Yes; Fluid consistency:: Thin  aspirin 81 mg orally every day prior to admission, now on aspirin 81 mg orally every day. Recommend to switch to Plavix for stroke prevention.  Patient counseled to be compliant with her antithrombotic medications  Ongoing aggressive stroke risk factor management  Therapy recommendations:  SNF recommended  Disposition:  Pending  Hypertension  Blood pressure tends to run mildly high  Permissive hypertension (OK if < 220/120) but gradually normalize in 5-7 days  Hyperlipidemia  Home meds:  No lipid lowering medications prior to admission  History of statin allergy  LDL 246, goal < 70  Recommend pravastatin 80mg  trial. If not tolerable, follow-up with PCP to consider PCSK9 inhibitors.  Diabetes  HgbA1c 6.9, goal < 7.0  Controlled  DM education  Other Stroke Risk Factors  Advanced age  Obesity, Body mass index is 34.97 kg/(m^2).   Hx stroke/TIA  CKD  Other Active Problems  Anemia  Statin allergy  Hospital day # 4  Neurology will sign off. Please call with questions. Pt will follow up with Dr. Erlinda Hong at [facility] in about 2 months. Thanks for the consult.  Rosalin Hawking, MD PhD Stroke Neurology 07/18/2015 2:21  PM     To contact Stroke Continuity provider, please refer to [email or web address] After hours, contact General Neurology

## 2015-07-18 NOTE — Progress Notes (Signed)
Nutrition Follow-up  DOCUMENTATION CODES:   Non-severe (moderate) malnutrition in context of acute illness/injury, Obesity unspecified  INTERVENTION:  Provide Ensure Enlive po BID, each supplement provides 350 kcal and 20 grams of protein  NUTRITION DIAGNOSIS:   Inadequate oral intake related to inability to eat as evidenced by NPO status.  Ongoing; Diet advanced but, PO intake remains inadequate  GOAL:   Patient will meet greater than or equal to 90% of their needs   MONITOR:   Diet advancement, Weight trends, Labs, I & O's  REASON FOR ASSESSMENT:   Malnutrition Screening Tool    ASSESSMENT:   79 year old female with a history of diabetes with gastroparesis, hypertension, CVA, chronic kidney disease stage III, monoclonal gammopathy, gastroesophageal reflux disease,fundic gland and hyperplastic gastric polyps, gastroparesis, diverticulitis with resection of most of her colon with ileostomy in place, fatty liver, elevated liver enzymes, followed by Dr. Hilarie Fredrickson for nausea who presents with chronic abdominal pain and nausea for about 2 months. She was last seen by GI on 04/17/15 for nausea that persists throughout the day, not associated with vomiting.She reports poor appetite and is unable to eat solid food 3 meals a day. She reports bowel movements are liquid as always and brown without blood or melena through her ostomy. She complains of dull but chronic abdominal pain. She is using Zofran and Reglan and is also on pantoprazole 40 mg twice daily.  Pt reports eating poorly due to ongoing poor appetite, abdominal pain, and nausea. Per nursing notes, pt is eating 25% of meals. RD emphasized the importance nutrition. Encouraged small frequent meals and snacks. Pt declines snacks at this time, agreeable to Ensure Enlive BID. No further weight loss, weight is up 4 lbs from admission.  Labs: elevated glucose, elevated chloride, low calcium, elevated creatinine, low GFR  Diet Order:   Diet heart healthy/carb modified Room service appropriate?: Yes; Fluid consistency:: Thin  Skin:  Reviewed, no issues  Last BM:  9/14  Height:   Ht Readings from Last 1 Encounters:  07/18/15 5\' 1"  (1.549 m)    Weight:   Wt Readings from Last 1 Encounters:  07/18/15 184 lb 15.5 oz (83.9 kg)    Ideal Body Weight:  47.73 kg (kg)  BMI:  Body mass index is 34.97 kg/(m^2).  Estimated Nutritional Needs:   Kcal:  1300-1500  Protein:  60-70 grams  Fluid:  2 L/day  EDUCATION NEEDS:   No education needs identified at this time  [place], LDN Inpatient Clinical Dietitian Pager: [phone] After Hours Pager: [phone]

## 2015-07-18 NOTE — Evaluation (Signed)
Physical Therapy Evaluation Patient Details Name: [name] MRN: [id number] DOB: [birth year] Today's Date: 07/18/2015   History of Present Illness  79 yo female admitted with acute on chronic kidney failure, nausea, gastroparesis. Pt with increased lethargy, confusion and difficulty with word finding on 9/15 with MRI consistent with R frontal infarct.  Pt was transferred from [facility] to [facility]. Hx of CVA, stage IV kidney disease, HTN, DM  Clinical Impression  Pt transferred to [facility] from [facility] when MRI revealed R frontal infarct.  Pt presents with deficits as indicated below.  She was able to ambulate using RW with Min Guard and verbal cueing for gait velocity, safety, and posture, limited by decreased activity tolerance. She has decreased safety awareness and apparent cognitive impairments Further PT is required to ensure safety with functional mobility and increased activity tolerance. ST SNF recommended upon discharge.    Follow Up Recommendations SNF;Supervision/Assistance - 24 hour    Equipment Recommendations  None recommended by PT    Recommendations for Other Services       Precautions / Restrictions Precautions Precautions: Fall Restrictions Weight Bearing Restrictions: No      Mobility  Bed Mobility Overal bed mobility: Needs Assistance Bed Mobility: Supine to Sit     Supine to sit: Supervision     General bed mobility comments: increased time and bed rail; cueing for hand placement  Transfers Overall transfer level: Needs assistance Equipment used: Rolling walker (2 wheeled) Transfers: Sit to/from [facility] Sit to Stand: Min guard Stand pivot transfers: Min guard       General transfer comment: guarding for safety; cueing for hand/foot placement and safety  Ambulation/Gait Ambulation/Gait assistance: Min guard Ambulation Distance (Feet): 45 Feet (45 feet, seated rest break, 20 feet) Assistive device: Rolling walker (2 wheeled) Gait  Pattern/deviations: Step-through pattern;Decreased stride length;Drifts right/left Gait velocity: decreased Gait velocity interpretation: Below normal speed for age/gender General Gait Details: verbal cueing for upright posture; when walking, pt stopping abruptly rather than making known that she was tired  Financial trader Rankin (Stroke Patients Only)       Balance Overall balance assessment: Needs assistance Sitting-balance support: No upper extremity supported;Feet supported Sitting balance-Leahy Scale: Good     Standing balance support: Bilateral upper extremity supported;During functional activity Standing balance-Leahy Scale: Poor                               Pertinent Vitals/Pain Pain Assessment: No/denies pain    Home Living Family/patient expects to be discharged to:: Private residence Living Arrangements: Spouse/significant other Available Help at Discharge: Family;Available 24 hours/day Type of Home: House Home Access: Ramped entrance     Home Layout: One level;Laundry or work area in [place]: Environmental consultant - 2 wheels;Cane - single point;Shower seat      Prior Function Level of Independence: Needs assistance   Gait / Transfers Assistance Needed: pt husband notes she has been ambulating with Mod Independence using RW for past month; prior to that was using [facility]  ADL's / Homemaking Assistance Needed: Husband performs all IADL, assists pt extensively with bathing and dressing, pt able to self feed, groom and toilet without assist.  Comments: pt husband helped her into/out of shower and helped her wash up     Hand Dominance   Dominant Hand: Right    Extremity/Trunk Assessment  Upper Extremity Assessment: Generalized weakness           Lower Extremity Assessment: Overall WFL for tasks assessed         Communication   Communication: Receptive difficulties;Expressive difficulties (pt  with limited communication; one word responses)  Cognition Arousal/Alertness: Awake/alert Behavior During Therapy: Flat affect Overall Cognitive Status: Impaired/Different from baseline (husband notes she is usually more aware and talkative) Area of Impairment: Following commands;Safety/judgement;Awareness;Problem solving;Attention;Orientation Orientation Level: Disoriented to;Time Current Attention Level: Focused   Following Commands: Follows one step commands inconsistently Safety/Judgement: Decreased awareness of safety Awareness: Intellectual Problem Solving: Slow processing;Requires verbal cues;Requires tactile cues General Comments: pt overall with very flat affect and limited communication which husband states is not typical    General Comments      Exercises        Assessment/Plan    PT Assessment Patient needs continued PT services  PT Diagnosis Difficulty walking;Altered mental status   PT Problem List Decreased strength;Decreased activity tolerance;Decreased balance;Decreased mobility;Decreased coordination;Decreased cognition;Decreased knowledge of use of DME;Decreased safety awareness  PT Treatment Interventions DME instruction;Gait training;Functional mobility training;Therapeutic activities;Therapeutic exercise;Balance training;Neuromuscular re-education;Patient/family education   PT Goals (Current goals can be found in the Care Plan section) Acute Rehab PT Goals Patient Stated Goal: husband would like pt to have rehab prior to return home PT Goal Formulation: With patient/family Time For Goal Achievement: 08/01/15 Potential to Achieve Goals: Fair    Frequency Min 3X/week   Barriers to discharge        Co-evaluation   Reason for Co-Treatment: For patient/therapist safety   OT goals addressed during session: ADL's and self-care       End of Session Equipment Utilized During Treatment: Gait belt (RW) Activity Tolerance: Patient limited by  fatigue Patient left: with nursing/sitter in room;with family/visitor present;in bed Nurse Communication: Mobility status         Time: 0802-2336 PT Time Calculation (min) (ACUTE ONLY): 24 min   Charges:   PT Evaluation $PT Re-evaluation: 1 Procedure     PT G Codes:        Amanda Tocci 2015/08/14, 4:45 PM  Lorita Officer, SPT

## 2015-07-19 DIAGNOSIS — E785 Hyperlipidemia, unspecified: Secondary | ICD-10-CM

## 2015-07-19 DIAGNOSIS — I10 Essential (primary) hypertension: Secondary | ICD-10-CM

## 2015-07-19 DIAGNOSIS — I63312 Cerebral infarction due to thrombosis of left middle cerebral artery: Secondary | ICD-10-CM

## 2015-07-19 DIAGNOSIS — N189 Chronic kidney disease, unspecified: Secondary | ICD-10-CM

## 2015-07-19 DIAGNOSIS — N179 Acute kidney failure, unspecified: Principal | ICD-10-CM

## 2015-07-19 LAB — CBC
HCT: 33.2 % — ABNORMAL LOW (ref 36.0–46.0)
Hemoglobin: 10.9 g/dL — ABNORMAL LOW (ref 12.0–15.0)
MCH: 31.1 pg (ref 26.0–34.0)
MCHC: 32.8 g/dL (ref 30.0–36.0)
MCV: 94.6 fL (ref 78.0–100.0)
PLATELETS: 163 10*3/uL (ref 150–400)
RBC: 3.51 MIL/uL — AB (ref 3.87–5.11)
RDW: 14.8 % (ref 11.5–15.5)
WBC: 6.3 10*3/uL (ref 4.0–10.5)

## 2015-07-19 LAB — BASIC METABOLIC PANEL
Anion gap: 12 (ref 5–15)
BUN: 17 mg/dL (ref 6–20)
CALCIUM: 9 mg/dL (ref 8.9–10.3)
CO2: 14 mmol/L — ABNORMAL LOW (ref 22–32)
CREATININE: 2.5 mg/dL — AB (ref 0.44–1.00)
Chloride: 111 mmol/L (ref 101–111)
GFR calc Af Amer: 20 mL/min — ABNORMAL LOW (ref >60)
GFR, EST NON AFRICAN AMERICAN: 17 mL/min — AB (ref >60)
Glucose, Bld: 243 mg/dL — ABNORMAL HIGH (ref 65–99)
POTASSIUM: 4 mmol/L (ref 3.5–5.1)
SODIUM: 137 mmol/L (ref 135–145)

## 2015-07-19 LAB — GLUCOSE, CAPILLARY
GLUCOSE-CAPILLARY: 182 mg/dL — AB (ref 65–99)
GLUCOSE-CAPILLARY: 231 mg/dL — AB (ref 65–99)
GLUCOSE-CAPILLARY: 171 mg/dL — AB (ref 65–99)
GLUCOSE-CAPILLARY: 168 mg/dL — AB (ref 65–99)

## 2015-07-19 LAB — LIPID PANEL
CHOLESTEROL: 169 mg/dL (ref 0–200)
HDL: 55 mg/dL (ref >40)
LDL Cholesterol: 74 mg/dL (ref 0–99)
Total CHOL/HDL Ratio: 3.1 RATIO
Triglycerides: 202 mg/dL — ABNORMAL HIGH (ref <150)
VLDL: 40 mg/dL (ref 0–40)

## 2015-07-19 MED ORDER — PRAVASTATIN SODIUM 20 MG PO TABS
20.0000 mg | ORAL_TABLET | Freq: Every day | ORAL | Status: DC
  Administered 2015-07-19 – 2015-07-20 (×2): 20 mg via ORAL
  Filled 2015-07-19 (×2): qty 1

## 2015-07-19 MED ORDER — CLOPIDOGREL BISULFATE 75 MG PO TABS
75.0000 mg | ORAL_TABLET | Freq: Every day | ORAL | Status: DC
  Administered 2015-07-19 – 2015-07-21 (×3): 75 mg via ORAL
  Filled 2015-07-19 (×3): qty 1

## 2015-07-19 MED ORDER — INSULIN GLARGINE 100 UNIT/ML ~~LOC~~ SOLN
15.0000 [IU] | Freq: Every day | SUBCUTANEOUS | Status: DC
  Administered 2015-07-19: 15 [IU] via SUBCUTANEOUS
  Filled 2015-07-19 (×2): qty 0.15

## 2015-07-19 MED ORDER — ONDANSETRON 4 MG PO TBDP: 8.0000 mg | ORAL_TABLET | Freq: Three times a day (TID) | ORAL | Status: DC | PRN

## 2015-07-19 NOTE — Progress Notes (Signed)
Family would like to have patient transfer to [facility] in [place] at disposition.   Ave Filter, RN

## 2015-07-19 NOTE — Progress Notes (Signed)
Physical Therapy Treatment Patient Details Name: [name] MRN: [id number] DOB: Mar 18, 1936 Today's Date: 07/19/2015    History of Present Illness 79 yo female admitted with acute on chronic kidney failure, nausea, gastroparesis. Pt with increased lethargy, confusion and difficulty with word finding on 9/15 with MRI consistent with R frontal infarct.  Pt was transferred from [facility] to [facility]. Hx of CVA, stage IV kidney disease, HTN, DM    PT Comments    Patient seen for mobility, assisted to bathroom with RW. Assist with hygiene and pericare and then return to bed. Patient limited by fatigue but overall tolerated well. Patient with some modest improvements in verbalizations this session.   Follow Up Recommendations  SNF;Supervision/Assistance - 24 hour     Equipment Recommendations  None recommended by PT    Recommendations for Other Services       Precautions / Restrictions Precautions Precautions: Fall Restrictions Weight Bearing Restrictions: No    Mobility  Bed Mobility Overal bed mobility: Needs Assistance Bed Mobility: Supine to Sit     Supine to sit: Supervision Sit to supine: Supervision   General bed mobility comments: increased time and bed rail; cueing for hand placement  Transfers Overall transfer level: Needs assistance Equipment used: Rolling walker (2 wheeled) Transfers: Sit to/from Stand Sit to Stand: Min guard;Min assist         General transfer comment: min guard from bed, min assist for stability from toilet with use of grab bar. assist for hygiene  Ambulation/Gait Ambulation/Gait assistance: Min guard Ambulation Distance (Feet): 30 Feet Assistive device: Rolling walker (2 wheeled) Gait Pattern/deviations: Step-through pattern;Decreased stride length;Drifts right/left Gait velocity: decreased   General Gait Details: verbal cueing for upright posture; when walking, pt stopping abruptly rather than making known that she was tired   Marine scientist Rankin (Stroke Patients Only) Modified Rankin (Stroke Patients Only) Pre-Morbid Rankin Score: No significant disability Modified Rankin: Moderately severe disability     Balance     Sitting balance-Leahy Scale: Good     Standing balance support: Bilateral upper extremity supported Standing balance-Leahy Scale: Poor                      Cognition Arousal/Alertness: Awake/alert Behavior During Therapy: Flat affect Overall Cognitive Status: Impaired/Different from baseline (boyfriend notes she is usually more aware and talkative) Area of Impairment: Following commands;Safety/judgement;Awareness;Problem solving;Attention;Orientation Orientation Level: Disoriented to;Time Current Attention Level: Focused   Following Commands: Follows one step commands inconsistently Safety/Judgement: Decreased awareness of safety Awareness: Intellectual Problem Solving: Slow processing;Requires verbal cues;Requires tactile cues General Comments: modest improvements noted in verbalizations this session    Exercises      General Comments        Pertinent Vitals/Pain      Home Living                      Prior Function            PT Goals (current goals can now be found in the care plan section) Acute Rehab PT Goals Patient Stated Goal: husband would like pt to have rehab prior to return home PT Goal Formulation: With patient/family Time For Goal Achievement: 08/01/15 Potential to Achieve Goals: Fair Progress towards PT goals: Progressing toward goals    Frequency  Min 3X/week    PT Plan Current plan remains appropriate    Co-evaluation  End of Session Equipment Utilized During Treatment: Gait belt (RW) Activity Tolerance: Patient limited by fatigue Patient left: with nursing/sitter in room;in bed;with bed alarm set     Time: 1020-1033 PT Time Calculation (min) (ACUTE ONLY): 13  min  Charges:  $Therapeutic Activity: 8-22 mins                    G CodesDuncan Dull 2015-08-14, 2:07 PM Alben Deeds, [place] DPT  [phone]

## 2015-07-19 NOTE — Progress Notes (Signed)
[facility] PROGRESS NOTE  [name] BTD:[id number] DOB: 09-21-1936 DOA: 07/14/2015 PCP: Redge Gainer, MD  Assessment/Plan: 79 y/o female with HTN, DM with gastroparesis, h/o CVA, CKD,stage III, monoclonal gammopathy, h/o diverticulitis with resection of most of her colon with ileostomy in place, fatty liver, elevated liver enzymes, followed by Dr. Hilarie Fredrickson for nausea who presents with chronic abdominal pain and nausea for about 2 months. CT abdomen pelvis without contrast negative for obstruction. Underwent EGD: Normal esophagus. Too many to count gastric polyps. No evidence of GOO or PUD. And recommended head MRI due to intractable nausea. Patient also noted to have some confusion. MRI showed acute infarction affect the LEFT frontal subcortical white matter, and thalamic infarcts. -patient transferred to [facility] for further neuro work up   1. Acute CVA. MRI head: Multifocal areas of non hemorrhagic acute infarction affect the LEFT frontal subcortical white matter. Advanced chronic microvascular ischemic change with generalized moderate atrophy. Areas of chronic ischemia as described, most notable being the LEFT thalamic hemorrhagic lacunar infarct -neuro signed off: Recommend pravastatin 80mg  trial. If not tolerable, follow-up with PCP to consider PCSK9 inhibitors.  Recommend to switch to Plavix for stroke prevention.   2. Acute on chronic kidney failure. likely secondary to dehydration, improving, creatinine back to baseline. discontinued IVF  3. Nausea likely multifactorial. (new CVA+gastroparesis).  GI signed off recommend small frequent meals  4. Essential hypertension. Continue scheduled hydralazine. Continue Norvasc and metoprolol. Titrate as needed. Mild permissive HTN 24-48 hrs  5. Abnormal troponin. slight elevation in the setting of renal failure, now 0.04 and unchanged 3, peak value was 0.09 suspect that this is likely in the setting of renal failure and does not reflect  any cardiac process, 2-D echo does not show any regional wall motion abnormalities, EF 55-60% 6. Ileostomy secondary to diverticulitis-no change in ostomy output since admission 7. Diabetes mellitus- SSI, HgbA1C 6.9, add lantus for better coverage 8. Recent UTI-UA negative this admission   Code Status: full Family Communication: d/w patient. No family at the bedside  Disposition Plan: SNF, d/c when bed available   Consultants:  Neurology. GI   Procedures:  EGD  EchoStudy Conclusions  - Left ventricle: The cavity size was mildly dilated. There was moderate concentric hypertrophy. Systolic function was normal. The estimated ejection fraction was in the range of 55% to 60%. Wall motion was normal; there were no regional wall motion abnormalities. - Aortic valve: There was trivial regurgitation. - Left atrium: The atrium was mildly dilated. - Atrial septum: No defect or patent foramen ovale was identified.     HPI/Subjective: Eating ok   Objective: Filed Vitals:   07/19/15 1034  BP: 163/48  Pulse: 64  Temp: 98.1 F (36.7 C)  Resp: 18    Intake/Output Summary (Last 24 hours) at 07/19/15 1118 Last data filed at 07/19/15 0830  Gross per 24 hour  Intake    240 ml  Output    100 ml  Net    140 ml   Filed Weights   07/14/15 2048 07/16/15 0805 07/18/15 0237  Weight: 81.738 kg (180 lb 3.2 oz) 81.647 kg (180 lb) 83.9 kg (184 lb 15.5 oz)    Exam:   General:  Appears sad  Cardiovascular: s1,s2 rrr  Respiratory: CTA BL  Abdomen: soft, nt  Musculoskeletal: no leg edema   Data Reviewed: Basic Metabolic Panel:  Recent Labs Lab 07/14/15 1435 07/14/15 1854 07/15/15 0119 07/16/15 0506 07/17/15 0540 07/19/15 0856  NA 134*  --  134*  140 139 137  K 4.9  --  4.6 4.4 4.3 4.0  CL 108  --  112* 118* 116* 111  CO2 16*  --  13* 15* 15* 14*  GLUCOSE 70  --  237* 154* 179* 243*  BUN 33*  --  30* 23* 18 17  CREATININE 3.37* 3.24* 3.01* 2.48* 2.27* 2.50*   CALCIUM 9.5  --  8.5* 8.6* 8.5* 9.0  MG  --  2.2  --   --   --   --    Liver Function Tests:  Recent Labs Lab 07/14/15 1435 07/15/15 0119 07/16/15 0506 07/17/15 0540  AST 51* 48* 45* 60*  ALT 37 31 27 30   ALKPHOS 172* 139* 119 117  BILITOT 0.4 0.3 0.7 0.5  PROT 7.8 6.2* 5.7* 5.8*  ALBUMIN 3.7 3.0* 2.8* 2.7*    Recent Labs Lab 07/14/15 1435  LIPASE 13*   No results for input(s): AMMONIA in the last 168 hours. CBC:  Recent Labs Lab 07/14/15 1435 07/15/15 0119 07/17/15 0540 07/19/15 0856  WBC 9.9 6.9 7.8 6.3  HGB 13.0 11.2* 9.5* 10.9*  HCT 38.6 33.7* 29.1* 33.2*  MCV 95.1 94.1 94.2 94.6  PLT 221 169 163 163   Cardiac Enzymes:  Recent Labs Lab 07/14/15 1854 07/15/15 0119 07/15/15 0801  TROPONINI 0.04* 0.04* 0.04*   BNP (last 3 results)  Recent Labs  03/09/15 0224  BNP 257.8*    ProBNP (last 3 results) No results for input(s): PROBNP in the last 8760 hours.  CBG:  Recent Labs Lab 07/17/15 2140 07/18/15 0801 07/18/15 1131 07/18/15 2159 07/19/15 0634  GLUCAP 186* 165* 238* 207* 182*    Recent Results (from the past 240 hour(s))  MRSA PCR Screening     Status: None   Collection Time: 07/17/15 10:43 AM  Result Value Ref Range Status   MRSA by PCR NEGATIVE NEGATIVE Final    Comment:        The GeneXpert MRSA Assay (FDA approved for NASAL specimens only), is one component of a comprehensive MRSA colonization surveillance program. It is not intended to diagnose MRSA infection nor to guide or monitor treatment for MRSA infections.      Studies: Mr Virgel Paling Wo Contrast  07/18/2015   CLINICAL DATA:  Intractable nausea and vomiting. Headache. Confusion. Difficulty speaking. Left frontal infarctions.  EXAM: MRA HEAD WITHOUT CONTRAST  TECHNIQUE: Angiographic images of the Circle of Willis were obtained using MRA technique without intravenous contrast.  COMPARISON:  MRI 07/17/2015.  MRI 10/21/2009.  FINDINGS: Both internal carotid arteries are  patent through the siphon region. The right internal carotid artery supplies the right middle cerebral artery in the right posterior cerebral artery. The left internal carotid artery supplies the left middle cerebral artery and both anterior cerebral artery territories. There is no major vessel occlusion. Intracranial vessels show mild atherosclerotic irregularity. There is some motion degradation.  Both vertebral arteries are patent to the basilar. There is diminished flow in the distal left vertebral artery when compared to the study of 2010. There is the appearance of distal basilar stenosis, but I think this is probably artifactual when evaluating the source images. Superior cerebellar and posterior cerebral arteries do show flow bilaterally.  IMPRESSION: Motion degraded study. No major vessel occlusion. Cannot rule out intracranial branch vessel stenotic disease.  There appears to be diminished flow in the left vertebral artery when compared to the study of 2010. This could be partially artifactual as well however.  Likely artifactual appearance  of stenosis of the distal basilar. In evaluating the source images, I think it is less likely that there is a true distal basilar stenosis.   Electronically Signed   By: Nelson Chimes M.D.   On: 07/18/2015 22:08   Mr Brain Wo Contrast  07/17/2015   CLINICAL DATA:  Headaches with confusion and unsteady gait. Stroke risk factors include diabetes, hypertension, and prior stroke. Also history of monoclonal gammopathy.  EXAM: MRI HEAD WITHOUT CONTRAST  TECHNIQUE: Multiplanar, multiecho pulse sequences of the brain and surrounding structures were obtained without intravenous contrast.  COMPARISON:  CT head 07/16/2015.  MR brain 10/21/2009.  FINDINGS: Multifocal areas of restricted diffusion, both punctate and confluent, affecting the LEFT frontal subcortical white matter, representing acute infarction. No hemorrhage, mass lesion, or extra-axial fluid.  Moderate cerebral and  cerebellar atrophy. Extensive T2 and FLAIR hyperintensities throughout the white matter, likely small vessel disease. Remote hemorrhagic infarct LEFT thalamus. Roughly symmetric T2 hyperintensities of the globus pallidum, nonspecific. Prominent perivascular spaces, sequelae of hypertension.  Flow voids are maintained. RIGHT vertebral is dominant. Normal pituitary and cerebellar tonsils. Significant cervical spondylosis at C3-4 is suspected.  Extracranial soft tissues show no significant finding. No concerning osseous lesions to suggest a marrow replacement process. Asymmetric and poorly aerated RIGHT mastoid with noted previously, stable.  Compared with prior MR, there is progression of atrophy and small vessel disease. The LEFT thalamic hemorrhage has developed since 2010.  IMPRESSION: Multifocal areas of non hemorrhagic acute infarction affect the LEFT frontal subcortical white matter.  Advanced chronic microvascular ischemic change with generalized moderate atrophy.  Areas of chronic ischemia as described, most notable being the LEFT thalamic hemorrhagic lacunar infarct.   Electronically Signed   By: Staci Righter M.D.   On: 07/17/2015 18:40   Mr Mrv Head Wo Cm  07/18/2015   CLINICAL DATA:  Intractable nausea and vomiting. Headache. Confusion. Speech disturbance. Left frontal infarctions.  EXAM: MR MRV HEAD WITHOUT CONTRAST  TECHNIQUE: Multiplanar, multisequence MR imaging was performed. No intravenous contrast was administered.  COMPARISON:  MRA same day. MRI yesterday. MRI with contrast 07/09/2004  FINDINGS: The study is significantly motion degraded. The sagittal sinus is patent. Internal cerebral veins and straight sinus are patent. Right transverse sinus is a large vessel that is patent. There is diminutive flow in the left transverse sinus particularly when evaluating the parenchymal imaging, I think this is simply a small-vessel rather than a thrombosed sinus. This impression is confirmed by looking at  the contrast-enhanced study from 2005, where this was a very diminutive vessel.  IMPRESSION: No evidence of venous thrombosis. Very diminutive left transverse sinus as demonstrated on previous studies. I do not believe there is any venous thrombosis.   Electronically Signed   By: Nelson Chimes M.D.   On: 07/18/2015 22:12    Scheduled Meds: . amLODipine  10 mg Oral Daily  . aspirin EC  81 mg Oral Daily  . feeding supplement (ENSURE ENLIVE)  237 mL Oral BID PC  . heparin subcutaneous  5,000 Units Subcutaneous 3 times per day  . hydrALAZINE  50 mg Oral 3 times per day  . insulin aspart  0-9 Units Subcutaneous TID WC  . levothyroxine  50 mcg Oral QAC breakfast  . metoprolol tartrate  25 mg Oral BID  . ondansetron  8 mg Oral Q8H  . pantoprazole  40 mg Oral QHS  . vitamin B-6  50 mg Oral Daily  . sodium chloride  3 mL Intravenous Q12H  Continuous Infusions:   Principal Problem:   Acute on chronic kidney failure Active Problems:   Essential hypertension   Chronic kidney disease   CVA (cerebral infarction), past history   Ileostomy secondary to diverticulitis   Gastroparesis due to DM   Acute renal failure superimposed on stage 4 chronic kidney disease   Malnutrition of moderate degree   Gastric polyp   Nausea with vomiting   HLD (hyperlipidemia)   Type 2 diabetes mellitus with other circulatory complications    Time spent: 25 minutes     Eulogio Bear  [facility] Pager [phone]. If 7PM-7AM, please contact night-coverage at [email or web address], password [facility] 07/19/2015, 11:18 AM  LOS: 5 days

## 2015-07-20 DIAGNOSIS — E1159 Type 2 diabetes mellitus with other circulatory complications: Secondary | ICD-10-CM

## 2015-07-20 LAB — GLUCOSE, CAPILLARY
GLUCOSE-CAPILLARY: 177 mg/dL — AB (ref 65–99)
GLUCOSE-CAPILLARY: 155 mg/dL — AB (ref 65–99)
GLUCOSE-CAPILLARY: 185 mg/dL — AB (ref 65–99)
Glucose-Capillary: 205 mg/dL — ABNORMAL HIGH (ref 65–99)

## 2015-07-20 MED ORDER — INSULIN GLARGINE 100 UNIT/ML ~~LOC~~ SOLN
20.0000 [IU] | Freq: Every day | SUBCUTANEOUS | Status: DC
  Administered 2015-07-20 – 2015-07-21 (×2): 20 [IU] via SUBCUTANEOUS
  Filled 2015-07-20 (×2): qty 0.2

## 2015-07-20 NOTE — Progress Notes (Signed)
[facility] PROGRESS NOTE  SRESHTA CRESSLER FGH:[id number] DOB: 1936-09-03 DOA: 07/14/2015 PCP: Redge Gainer, MD  Assessment/Plan: 79 y/o female with HTN, DM with gastroparesis, h/o CVA, CKD,stage III, monoclonal gammopathy, h/o diverticulitis with resection of most of her colon with ileostomy in place, fatty liver, elevated liver enzymes, followed by Dr. Hilarie Fredrickson for nausea who presents with chronic abdominal pain and nausea for about 2 months. CT abdomen pelvis without contrast negative for obstruction. Underwent EGD: Normal esophagus. Too many to count gastric polyps. No evidence of GOO or PUD. And recommended head MRI due to intractable nausea. Patient also noted to have some confusion. MRI showed acute infarction affect the LEFT frontal subcortical white matter, and thalamic infarcts. -patient transferred to [facility] for further neuro work up   1. Acute CVA. MRI head: Multifocal areas of non hemorrhagic acute infarction affect the LEFT frontal subcortical white matter. Advanced chronic microvascular ischemic change with generalized moderate atrophy. Areas of chronic ischemia as described, most notable being the LEFT thalamic hemorrhagic lacunar infarct -neuro signed off: Recommend pravastatin 80mg  trial. If not tolerable, follow-up with PCP to consider PCSK9 inhibitors.  Recommend to switch to Plavix for stroke prevention.   2. Acute on chronic kidney failure. likely secondary to dehydration, improving, creatinine back to baseline. discontinued IVF  3. Nausea likely multifactorial. (new CVA+gastroparesis).  GI signed off recommend small frequent meals  4. Essential hypertension. Continue scheduled hydralazine. Continue Norvasc and metoprolol. Titrate as needed. Mild permissive HTN 24-48 hrs  5. Abnormal troponin. slight elevation in the setting of renal failure, now 0.04 and unchanged 3, peak value was 0.09 suspect that this is likely in the setting of renal failure and does not reflect  any cardiac process, 2-D echo does not show any regional wall motion abnormalities, EF 55-60% 6. Ileostomy secondary to diverticulitis-no change in ostomy output since admission 7. Diabetes mellitus- SSI, HgbA1C 6.9, add lantus for better coverage 8. Recent UTI-UA negative this admission   Code Status: full Family Communication: d/w patient. No family at the bedside  Disposition Plan: SNF, d/c when bed available- medically stable   Consultants:  Neurology. GI   Procedures:  EGD  EchoStudy Conclusions  - Left ventricle: The cavity size was mildly dilated. There was moderate concentric hypertrophy. Systolic function was normal. The estimated ejection fraction was in the range of 55% to 60%. Wall motion was normal; there were no regional wall motion abnormalities. - Aortic valve: There was trivial regurgitation. - Left atrium: The atrium was mildly dilated. - Atrial septum: No defect or patent foramen ovale was identified.     HPI/Subjective: No overnight events   Objective: Filed Vitals:   07/20/15 0600  BP: 128/73  Pulse: 78  Temp: 97.8 F (36.6 C)  Resp: 18    Intake/Output Summary (Last 24 hours) at 07/20/15 0742 Last data filed at 07/19/15 1230  Gross per 24 hour  Intake    480 ml  Output      0 ml  Net    480 ml   Filed Weights   07/14/15 2048 07/16/15 0805 07/18/15 0237  Weight: 81.738 kg (180 lb 3.2 oz) 81.647 kg (180 lb) 83.9 kg (184 lb 15.5 oz)    Exam:   General:  Appears sad  Cardiovascular: s1,s2 rrr  Respiratory: CTA BL  Abdomen: soft, nt  Musculoskeletal: no leg edema   Data Reviewed: Basic Metabolic Panel:  Recent Labs Lab 07/14/15 1435 07/14/15 1854 07/15/15 0119 07/16/15 0506 07/17/15 0540 07/19/15 0856  NA  134*  --  134* 140 139 137  K 4.9  --  4.6 4.4 4.3 4.0  CL 108  --  112* 118* 116* 111  CO2 16*  --  13* 15* 15* 14*  GLUCOSE 70  --  237* 154* 179* 243*  BUN 33*  --  30* 23* 18 17  CREATININE 3.37*  3.24* 3.01* 2.48* 2.27* 2.50*  CALCIUM 9.5  --  8.5* 8.6* 8.5* 9.0  MG  --  2.2  --   --   --   --    Liver Function Tests:  Recent Labs Lab 07/14/15 1435 07/15/15 0119 07/16/15 0506 07/17/15 0540  AST 51* 48* 45* 60*  ALT 37 31 27 30   ALKPHOS 172* 139* 119 117  BILITOT 0.4 0.3 0.7 0.5  PROT 7.8 6.2* 5.7* 5.8*  ALBUMIN 3.7 3.0* 2.8* 2.7*    Recent Labs Lab 07/14/15 1435  LIPASE 13*   No results for input(s): AMMONIA in the last 168 hours. CBC:  Recent Labs Lab 07/14/15 1435 07/15/15 0119 07/17/15 0540 07/19/15 0856  WBC 9.9 6.9 7.8 6.3  HGB 13.0 11.2* 9.5* 10.9*  HCT 38.6 33.7* 29.1* 33.2*  MCV 95.1 94.1 94.2 94.6  PLT 221 169 163 163   Cardiac Enzymes:  Recent Labs Lab 07/14/15 1854 07/15/15 0119 07/15/15 0801  TROPONINI 0.04* 0.04* 0.04*   BNP (last 3 results)  Recent Labs  03/09/15 0224  BNP 257.8*    ProBNP (last 3 results) No results for input(s): PROBNP in the last 8760 hours.  CBG:  Recent Labs Lab 07/19/15 0634 07/19/15 1113 07/19/15 1658 07/19/15 2139 07/20/15 0641  GLUCAP 182* 231* 171* 168* 177*    Recent Results (from the past 240 hour(s))  MRSA PCR Screening     Status: None   Collection Time: 07/17/15 10:43 AM  Result Value Ref Range Status   MRSA by PCR NEGATIVE NEGATIVE Final    Comment:        The GeneXpert MRSA Assay (FDA approved for NASAL specimens only), is one component of a comprehensive MRSA colonization surveillance program. It is not intended to diagnose MRSA infection nor to guide or monitor treatment for MRSA infections.      Studies: Mr Virgel Paling Wo Contrast  07/18/2015   CLINICAL DATA:  Intractable nausea and vomiting. Headache. Confusion. Difficulty speaking. Left frontal infarctions.  EXAM: MRA HEAD WITHOUT CONTRAST  TECHNIQUE: Angiographic images of the Circle of Willis were obtained using MRA technique without intravenous contrast.  COMPARISON:  MRI 07/17/2015.  MRI 10/21/2009.  FINDINGS: Both  internal carotid arteries are patent through the siphon region. The right internal carotid artery supplies the right middle cerebral artery in the right posterior cerebral artery. The left internal carotid artery supplies the left middle cerebral artery and both anterior cerebral artery territories. There is no major vessel occlusion. Intracranial vessels show mild atherosclerotic irregularity. There is some motion degradation.  Both vertebral arteries are patent to the basilar. There is diminished flow in the distal left vertebral artery when compared to the study of 2010. There is the appearance of distal basilar stenosis, but I think this is probably artifactual when evaluating the source images. Superior cerebellar and posterior cerebral arteries do show flow bilaterally.  IMPRESSION: Motion degraded study. No major vessel occlusion. Cannot rule out intracranial branch vessel stenotic disease.  There appears to be diminished flow in the left vertebral artery when compared to the study of 2010. This could be partially artifactual as well  however.  Likely artifactual appearance of stenosis of the distal basilar. In evaluating the source images, I think it is less likely that there is a true distal basilar stenosis.   Electronically Signed   By: Nelson Chimes M.D.   On: 07/18/2015 22:08   Mr Mrv Head Wo Cm  07/18/2015   CLINICAL DATA:  Intractable nausea and vomiting. Headache. Confusion. Speech disturbance. Left frontal infarctions.  EXAM: MR MRV HEAD WITHOUT CONTRAST  TECHNIQUE: Multiplanar, multisequence MR imaging was performed. No intravenous contrast was administered.  COMPARISON:  MRA same day. MRI yesterday. MRI with contrast 07/09/2004  FINDINGS: The study is significantly motion degraded. The sagittal sinus is patent. Internal cerebral veins and straight sinus are patent. Right transverse sinus is a large vessel that is patent. There is diminutive flow in the left transverse sinus particularly when  evaluating the parenchymal imaging, I think this is simply a small-vessel rather than a thrombosed sinus. This impression is confirmed by looking at the contrast-enhanced study from 2005, where this was a very diminutive vessel.  IMPRESSION: No evidence of venous thrombosis. Very diminutive left transverse sinus as demonstrated on previous studies. I do not believe there is any venous thrombosis.   Electronically Signed   By: Nelson Chimes M.D.   On: 07/18/2015 22:12    Scheduled Meds: . amLODipine  10 mg Oral Daily  . clopidogrel  75 mg Oral Daily  . feeding supplement (ENSURE ENLIVE)  237 mL Oral BID PC  . heparin subcutaneous  5,000 Units Subcutaneous 3 times per day  . hydrALAZINE  50 mg Oral 3 times per day  . insulin aspart  0-9 Units Subcutaneous TID WC  . insulin glargine  15 Units Subcutaneous Daily  . levothyroxine  50 mcg Oral QAC breakfast  . metoprolol tartrate  25 mg Oral BID  . pantoprazole  40 mg Oral QHS  . pravastatin  20 mg Oral q1800  . vitamin B-6  50 mg Oral Daily  . sodium chloride  3 mL Intravenous Q12H   Continuous Infusions:   Principal Problem:   Acute on chronic kidney failure Active Problems:   Essential hypertension   Chronic kidney disease   CVA (cerebral infarction), past history   Ileostomy secondary to diverticulitis   Gastroparesis due to DM   Acute renal failure superimposed on stage 4 chronic kidney disease   Malnutrition of moderate degree   Gastric polyp   Nausea with vomiting   HLD (hyperlipidemia)   Type 2 diabetes mellitus with other circulatory complications    Time spent: 25 minutes     VANN, JESSICA  [facility] Pager [phone]. If 7PM-7AM, please contact night-coverage at [email or web address], password [facility] 07/20/2015, 7:42 AM  LOS: 6 days

## 2015-07-20 NOTE — Clinical Social Work Placement (Signed)
   CLINICAL SOCIAL WORK PLACEMENT  NOTE  Date:  07/20/2015  Patient Details  Name: [name] MRN: [id number] Date of Birth: 07/30/1936  Clinical Social Work is seeking post-discharge placement for this patient at the [place] level of care (*CSW will initial, date and re-position this form in  chart as items are completed):  Yes   Patient/family provided with [place] Work Department's list of facilities offering this level of care within the geographic area requested by the patient (or if unable, by the patient's family).  Yes   Patient/family informed of their freedom to choose among providers that offer the needed level of care, that participate in Medicare, Medicaid or managed care program needed by the patient, have an available bed and are willing to accept the patient.  Yes   Patient/family informed of [facility]'s ownership interest in [facility] and [facility], as well as of the fact that they are under no obligation to receive care at these facilities.  PASRR submitted to EDS on 07/20/15     PASRR number received on 07/20/15     Existing PASRR number confirmed on       FL2 transmitted to all facilities in geographic area requested by pt/family on 07/20/15     FL2 transmitted to all facilities within larger geographic area on 07/20/15     Patient informed that his/her managed care company has contracts with or will negotiate with certain facilities, including the following:            Patient/family informed of bed offers received.  Patient chooses bed at       Physician recommends and patient chooses bed at      Patient to be transferred to   on  .  Patient to be transferred to facility by       Patient family notified on   of transfer.  Name of family member notified:        PHYSICIAN       Additional Comment:    _______________________________________________ Christene Lye, LCSW 07/20/2015, 5:08  PM

## 2015-07-20 NOTE — Clinical Social Work Note (Signed)
Clinical Social Work Assessment  Patient Details  Name: [name] MRN: [id number] Date of Birth: 1936/10/09  Date of referral:  07/19/15               Reason for consult:  Facility Placement                Permission sought to share information with:    Permission granted to share information::  Yes, Verbal Permission Granted  Name::        Agency::     Relationship::     Contact Information:     Housing/Transportation Living arrangements for the past 2 months:  Single Family Home Source of Information:  Patient, Spouse Patient Interpreter Needed:  None Criminal Activity/Legal Involvement Pertinent to Current Situation/Hospitalization:  No - Comment as needed Significant Relationships:    Lives with:  Adult Children, Spouse Do you feel safe going back to the place where you live?  Yes Need for family participation in patient care:  Yes (Comment)  Care giving concerns: Patient and husband agree that husband will not be able to provide patient with care by himself without patient going to rehab and getting stronger. Patient and husband agree that  1st choice - [facility]) 2nd choice - [facility]) 3rd choice - [facility] ([facility])   Social Worker assessment / plan: Faxed patient out to those facilities as well as other facilities in [place] and [employer].  Employment status:  Retired Nurse, adult PT Recommendations:  [place] / Referral to community resources:  [place]  Patient/Family's Response to care: Husband and wife both agreeable to plan and excited to have a plan for the patient's follow up care.   Patient/Family's Understanding of and Emotional Response to Diagnosis, Current Treatment, and Prognosis: Family optimistic for a full recovery.   Emotional Assessment Appearance:  Appears stated age Attitude/Demeanor/Rapport:   (appropriate and  open) Affect (typically observed):  Accepting Orientation:  Oriented to Self, Oriented to Place, Oriented to  Time, Oriented to Situation Alcohol / Substance use:  Not Applicable Psych involvement (Current and /or in the community):  No (Comment)  Discharge Needs  Concerns to be addressed:  No discharge needs identified Readmission within the last 30 days:  No Current discharge risk:  None Barriers to Discharge:  No Barriers Identified   Christene Lye, LCSW 07/20/2015, 5:01 PM

## 2015-07-21 LAB — BASIC METABOLIC PANEL
ANION GAP: 8 (ref 5–15)
BUN: 17 mg/dL (ref 6–20)
CHLORIDE: 110 mmol/L (ref 101–111)
CO2: 17 mmol/L — AB (ref 22–32)
Calcium: 8.7 mg/dL — ABNORMAL LOW (ref 8.9–10.3)
Creatinine, Ser: 2.56 mg/dL — ABNORMAL HIGH (ref 0.44–1.00)
GFR calc Af Amer: 20 mL/min — ABNORMAL LOW (ref >60)
GFR calc non Af Amer: 17 mL/min — ABNORMAL LOW (ref >60)
Glucose, Bld: 159 mg/dL — ABNORMAL HIGH (ref 65–99)
POTASSIUM: 4.1 mmol/L (ref 3.5–5.1)
Sodium: 135 mmol/L (ref 135–145)

## 2015-07-21 LAB — CBC
HEMATOCRIT: 28.7 % — AB (ref 36.0–46.0)
Hemoglobin: 9.5 g/dL — ABNORMAL LOW (ref 12.0–15.0)
MCH: 30.8 pg (ref 26.0–34.0)
MCHC: 33.1 g/dL (ref 30.0–36.0)
MCV: 93.2 fL (ref 78.0–100.0)
Platelets: 165 10*3/uL (ref 150–400)
RBC: 3.08 MIL/uL — AB (ref 3.87–5.11)
RDW: 14.3 % (ref 11.5–15.5)
WBC: 5.3 10*3/uL (ref 4.0–10.5)

## 2015-07-21 LAB — GLUCOSE, CAPILLARY
GLUCOSE-CAPILLARY: 243 mg/dL — AB (ref 65–99)
GLUCOSE-CAPILLARY: 208 mg/dL — AB (ref 65–99)
Glucose-Capillary: 144 mg/dL — ABNORMAL HIGH (ref 65–99)

## 2015-07-21 MED ORDER — INSULIN GLARGINE 100 UNIT/ML ~~LOC~~ SOLN: 20.0000 [IU] | Freq: Every day | SUBCUTANEOUS | Status: DC

## 2015-07-21 MED ORDER — CLOPIDOGREL BISULFATE 75 MG PO TABS: 75.0000 mg | ORAL_TABLET | Freq: Every day | ORAL | Status: DC

## 2015-07-21 MED ORDER — ENSURE ENLIVE PO LIQD: 237.0000 mL | Freq: Two times a day (BID) | ORAL | Status: DC

## 2015-07-21 MED ORDER — METOPROLOL TARTRATE 25 MG PO TABS: 25.0000 mg | ORAL_TABLET | Freq: Two times a day (BID) | ORAL | Status: DC

## 2015-07-21 MED ORDER — ONDANSETRON 8 MG PO TBDP: 8.0000 mg | ORAL_TABLET | Freq: Three times a day (TID) | ORAL | Status: DC | PRN

## 2015-07-21 MED ORDER — PYRIDOXINE HCL 50 MG PO TABS: 50.0000 mg | ORAL_TABLET | Freq: Every day | ORAL | Status: DC

## 2015-07-21 MED ORDER — INSULIN ASPART 100 UNIT/ML ~~LOC~~ SOLN
0.0000 [IU] | Freq: Three times a day (TID) | SUBCUTANEOUS | Status: DC
Start: 2015-07-21 — End: 2015-08-18

## 2015-07-21 MED ORDER — PRAVASTATIN SODIUM 20 MG PO TABS: 20.0000 mg | ORAL_TABLET | Freq: Every day | ORAL | Status: DC

## 2015-07-21 NOTE — Clinical Social Work Note (Signed)
Clinical Social Worker facilitated patient discharge including contacting patient family and facility to confirm patient discharge plans.  Clinical information faxed to facility and family agreeable with plan.  CSW arranged ambulance transport via [facility] to [facility].  RN to call report prior to discharge.  DC packet prepared and on chart for transport with number for report. Husband will complete admissions paperwork on arrival.   Clinical Social Worker will sign off for now as social work intervention is no longer needed. Please consult [name] again if new need arises.  Glendon Axe, MSW, LCSWA [phone] 07/21/2015 11:19 AM

## 2015-07-21 NOTE — Discharge Summary (Signed)
Physician Discharge Summary  [name] WJX:[id number] DOB: Nov 15, 1935 DOA: 07/14/2015  PCP: Redge Gainer, MD  Admit date: 07/14/2015 Discharge date: 07/21/2015  Time spent: 35 minutes  Recommendations for Outpatient Follow-up:  1. Small frequent meals 2. Further titration of BP Meds   Discharge Diagnoses:  Principal Problem:   Acute on chronic kidney failure Active Problems:   Essential hypertension   Chronic kidney disease   CVA (cerebral infarction), past history   Ileostomy secondary to diverticulitis   Gastroparesis due to DM   Acute renal failure superimposed on stage 4 chronic kidney disease   Malnutrition of moderate degree   Gastric polyp   Nausea with vomiting   HLD (hyperlipidemia)   Type 2 diabetes mellitus with other circulatory complications   Discharge Condition: improved  Diet recommendation: cardiac/diabetic  Filed Weights   07/14/15 2048 07/16/15 0805 07/18/15 0237  Weight: 81.738 kg (180 lb 3.2 oz) 81.647 kg (180 lb) 83.9 kg (184 lb 15.5 oz)    History of present illness:  79 year old female with a history of diabetes with gastroparesis, hypertension, CVA, chronic kidney disease stage III, monoclonal gammopathy, gastroesophageal reflux disease,fundic gland and hyperplastic gastric polyps, gastroparesis, diverticulitis with resection of most of her colon with ileostomy in place, fatty liver, elevated liver enzymes, followed by Dr. Hilarie Fredrickson for nausea who presents with chronic abdominal pain and nausea for about 2 months. She was last seen by GI on 04/17/15 for nausea that persists throughout the day, not associated with vomiting.She reports poor appetite and is unable to eat solid food 3 meals a day. She reports bowel movements are liquid as always and brown without blood or melena through her ostomy. She complains of dull but chronic abdominal pain. She is using Zofran and Reglan and is also on pantoprazole 40 mg twice daily.  In her own words she  states that she is eating 25% of what she normally eats for the last 2 months. CBG was found to be 60. She also reports multiple hypoglycemic events at home. She denies any diarrhea. Patient is on Reglan 3 times a day. CT abdomen pelvis without contrast negative for obstruction. Is a history of recent EGD on 11/15 which showed gastric polyps, which were benign without dysplasia.  Seen on 8/30 in the ER for nausea which improved with IV Reglan, cardiac enzymes were checked and were found to be negative. Symptoms were felt to be secondary to chronic gastroparesis. Patient was also found to have a urinary tract infection 8/30 with 11-20 white blood cells in her urine, recently completed a course of Macrodantin.  Hospital Course:  1. Acute CVA. MRI head: Multifocal areas of non hemorrhagic acute infarction affect the LEFT frontal subcortical white matter. Advanced chronic microvascular ischemic change with generalized moderate atrophy. Areas of chronic ischemia as described, most notable being the LEFT thalamic hemorrhagic lacunar infarct -neuro signed off: Recommend pravastatin titrate up to 80mg . If not tolerable, follow-up with PCP to consider PCSK9 inhibitors.  Recommend to switch to Plavix for stroke prevention.  2. Acute on chronic kidney failure. likely secondary to dehydration, improving, creatinine back to baseline. discontinued IVF  3. Nausea likely multifactorial. (new CVA+gastroparesis).  GI signed off recommend small frequent meals  4. Essential hypertension. Continue scheduled hydralazine. Continue Norvasc and metoprolol. Titrate as needed. Mild permissive HTN 24-48 hrs  5. Abnormal troponin. slight elevation in the setting of renal failure, now 0.04 and unchanged 3, peak value was 0.09 suspect that this is likely in the setting of  renal failure and does not reflect any cardiac process, 2-D echo does not show any regional wall motion abnormalities, EF 55-60% 6. Ileostomy secondary  to diverticulitis-no change in ostomy output since admission 7. Diabetes mellitus- SSI, HgbA1C 6.9, add lantus for better coverage 8. Recent UTI-UA negative this admission    Procedures:    Consultations:  Neuro  GI   Discharge Exam: Filed Vitals:   07/21/15 0443  BP: 178/52  Pulse: 66  Temp: 98 F (36.7 C)  Resp: 18    General: awake, NAD   Discharge Instructions   Discharge Instructions    Diet - low sodium heart healthy    Complete by:  As directed      Diet Carb Modified    Complete by:  As directed      Discharge instructions    Complete by:  As directed   Cbc, bmp 1 week     Increase activity slowly    Complete by:  As directed           Current Discharge Medication List    START taking these medications   Details  clopidogrel (PLAVIX) 75 MG tablet Take 1 tablet (75 mg total) by mouth daily.    feeding supplement, ENSURE ENLIVE, (ENSURE ENLIVE) LIQD Take 237 mLs by mouth 2 (two) times daily after a meal. Qty: 237 mL, Refills: 12    insulin aspart (NOVOLOG) 100 UNIT/ML injection Inject 0-9 Units into the skin 3 (three) times daily with meals. Qty: 10 mL, Refills: 11    insulin glargine (LANTUS) 100 UNIT/ML injection Inject 0.2 mLs (20 Units total) into the skin daily. Qty: 10 mL, Refills: 11    pravastatin (PRAVACHOL) 20 MG tablet Take 1 tablet (20 mg total) by mouth daily at 6 PM.    pyridOXINE (B-6) 50 MG tablet Take 1 tablet (50 mg total) by mouth daily.      CONTINUE these medications which have CHANGED   Details  metoprolol tartrate (LOPRESSOR) 25 MG tablet Take 1 tablet (25 mg total) by mouth 2 (two) times daily.    ondansetron (ZOFRAN-ODT) 8 MG disintegrating tablet Take 1 tablet (8 mg total) by mouth every 8 (eight) hours as needed for nausea or vomiting. Qty: 20 tablet, Refills: 0      CONTINUE these medications which have NOT CHANGED   Details  acetaminophen (TYLENOL) 500 MG tablet Take 1,000 mg by mouth every 6 (six) hours as  needed for moderate pain.     amLODipine (NORVASC) 10 MG tablet TAKE 1 TABLET ONCE A DAY Qty: 90 tablet, Refills: 1    B Complex-C-Folic Acid (MULTIVITAMIN, STRESS FORMULA) tablet Take 1 tablet by mouth daily.      glucagon (GLUCAGON EMERGENCY) 1 MG injection Inject into the muscle as needed for severe low BG Qty: 1 each, Refills: 1    guaiFENesin (MUCINEX) 600 MG 12 hr tablet Take 1,200 mg by mouth 2 (two) times daily as needed for cough or to loosen phlegm.     Insulin Pen Needle (PEN NEEDLES 31GX5/16") 31G X 8 MM MISC USE AS DIRECTED TWICE A DAY Qty: 100 each, Refills: 11    Lancets (ONETOUCH ULTRASOFT) lancets CHECK BLOOD SUGAR 4 TIMES A DAY Qty: 100 each, Refills: 1    levothyroxine (SYNTHROID, LEVOTHROID) 50 MCG tablet TAKE 1 TABLET DAILY Qty: 30 tablet, Refills: 11    mupirocin ointment (BACTROBAN) 2 % APPLY TO AFFECTED AREA AS DIRECTED Qty: 22 g, Refills: PRN    omega-3 acid  ethyl esters (LOVAZA) 1 G capsule TAKE (2) CAPSULES TWICE DAILY. Qty: 120 capsule, Refills: 5    ONE TOUCH ULTRA TEST test strip Test blood sugar qid prn. DX E11.8 Qty: 125 each, Refills: 5    pantoprazole (PROTONIX) 40 MG tablet TAKE (1) TABLET TWICE A DAY. Qty: 60 tablet, Refills: 2    Polyethyl Glycol-Propyl Glycol 0.4-0.3 % SOLN Apply 1 drop to eye 3 (three) times daily as needed (dry eyes).    nitrofurantoin (MACRODANTIN) 100 MG capsule Take 1 capsule (100 mg total) by mouth 2 (two) times daily. Qty: 14 capsule, Refills: 0    traMADol (ULTRAM) 50 MG tablet Take 1 tablet (50 mg total) by mouth every 6 (six) hours as needed. Qty: 60 tablet, Refills: 0      STOP taking these medications     aspirin EC 81 MG tablet      diazepam (VALIUM) 2 MG tablet      insulin lispro (HUMALOG KWIKPEN) 100 UNIT/ML KiwkPen      insulin lispro protamine-lispro (HUMALOG MIX 50/50) (50-50) 100 UNIT/ML SUSP injection      metoCLOPramide (REGLAN) 5 MG tablet      ondansetron (ZOFRAN) 8 MG tablet       cephALEXin (KEFLEX) 500 MG capsule        Allergies  Allergen Reactions  . Ace Inhibitors Other (See Comments)    unknown  . Actos [Pioglitazone Hydrochloride] Other (See Comments)    Jitters   . Aspirin     REACTION: GI upset  . Codeine     REACTION: hallucinations  . Hydromorphone Hcl Nausea And Vomiting  . Metformin And Related Nausea And Vomiting  . Penicillins     REACTION: "crazy feeling"  . Procaine Hcl Other (See Comments)    Pt goes crazy   . Statins Other (See Comments)    myalgias  . Sulfa Antibiotics    Follow-up Information    Follow up with Xu,Jindong, MD. Schedule an appointment as soon as possible for a visit in 2 months.   Specialty:  Neurology   Why:  stroke clinic   Contact information:   [address] [place] [address] [phone]       Follow up with Redge Gainer, MD In 1 week.   Specialty:  Family Medicine   Contact information:   [place] [address] [phone]        The results of significant diagnostics from this hospitalization (including imaging, microbiology, ancillary and laboratory) are listed below for reference.    Significant Diagnostic Studies: Ct Abdomen Pelvis Wo Contrast  07/14/2015   CLINICAL DATA:  Abdominal pain with nausea for 3 weeks.  EXAM: CT ABDOMEN AND PELVIS WITHOUT CONTRAST  TECHNIQUE: Multidetector CT imaging of the abdomen and pelvis was performed following the standard protocol without IV contrast.  COMPARISON:  Multiple exams, including 07/01/2015 and 07/11/2014  FINDINGS: Lower chest: Coronary artery atherosclerotic calcification along with calcification of the upper portion of the mitral valve. Faint calcification along the tracheobronchial tree at the lung bases, likely incidental.  Hepatobiliary: Unremarkable  Pancreas: Severely atrophic pancreas, with little in the way of recognizable pancreatic parenchymal tissue. This is a chronic appearance.  Spleen: Unremarkable   Adrenals/Urinary Tract: Mild bilateral renal atrophy, otherwise normal.  Stomach/Bowel: The ligament of Treitz appears to be lax, with the proximal jejunum extending caudad into the large peristomal hernia which contains multiple loops of bowel along with accompanying mesentery, in addition to the  ostomy site. The colon appears to be entirely intra-abdominal. Anastomotic site at the rectosigmoid junction.  Vascular/Lymphatic: Aortoiliac atherosclerotic vascular disease.  Reproductive: Unremarkable  Other: No supplemental non-categorized findings.  Musculoskeletal: Articular cartilage thinning in both hips. Stable appearance of 70% compression fracture at T12. No change in the low intervertebral disc height at L5-S1.  IMPRESSION: 1. The patient has a large peristomal hernia on the left containing multiple loops of small bowel and mesentery, but this not appreciably changed compared to last year at this time, and there no findings of strangulation or obstruction. The the ligament of Treitz is lax. 2. Other imaging findings of potential clinical significance: Coronary atherosclerosis; calcified mitral valve; chronically atrophic pancreas ; mild bilateral renal atrophy; aortoiliac atherosclerosis; mild degenerative cartilage thinning in both hips; chronic 70% compression fracture at T12.   Electronically Signed   By: Van Clines M.D.   On: 07/14/2015 18:00   Ct Head Wo Contrast  07/16/2015   CLINICAL DATA:  Nausea and headaches.  EXAM: CT HEAD WITHOUT CONTRAST  TECHNIQUE: Contiguous axial images were obtained from the base of the skull through the vertex without intravenous contrast.  COMPARISON:  10/20/2009  FINDINGS: Stable age related cerebral atrophy, ventriculomegaly and periventricular white matter disease. Remote lacunar-type basal ganglia infarcts are noted. No extra-axial fluid collections are identified. No CT findings for acute hemispheric infarction or intracranial hemorrhage. No mass lesions. The  brainstem and cerebellum are normal.  The bony structures are intact. No skull fracture or bone lesion. The paranasal sinuses and mastoid air cells are grossly clear. The globes are intact.  IMPRESSION: Chronic changes in the brain but no acute findings.   Electronically Signed   By: Marijo Sanes M.D.   On: 07/16/2015 14:43   Mr Jodene Nam Head Wo Contrast  07/18/2015   CLINICAL DATA:  Intractable nausea and vomiting. Headache. Confusion. Difficulty speaking. Left frontal infarctions.  EXAM: MRA HEAD WITHOUT CONTRAST  TECHNIQUE: Angiographic images of the Circle of Willis were obtained using MRA technique without intravenous contrast.  COMPARISON:  MRI 07/17/2015.  MRI 10/21/2009.  FINDINGS: Both internal carotid arteries are patent through the siphon region. The right internal carotid artery supplies the right middle cerebral artery in the right posterior cerebral artery. The left internal carotid artery supplies the left middle cerebral artery and both anterior cerebral artery territories. There is no major vessel occlusion. Intracranial vessels show mild atherosclerotic irregularity. There is some motion degradation.  Both vertebral arteries are patent to the basilar. There is diminished flow in the distal left vertebral artery when compared to the study of 2010. There is the appearance of distal basilar stenosis, but I think this is probably artifactual when evaluating the source images. Superior cerebellar and posterior cerebral arteries do show flow bilaterally.  IMPRESSION: Motion degraded study. No major vessel occlusion. Cannot rule out intracranial branch vessel stenotic disease.  There appears to be diminished flow in the left vertebral artery when compared to the study of 2010. This could be partially artifactual as well however.  Likely artifactual appearance of stenosis of the distal basilar. In evaluating the source images, I think it is less likely that there is a true distal basilar stenosis.    Electronically Signed   By: Nelson Chimes M.D.   On: 07/18/2015 22:08   Mr Brain Wo Contrast  07/17/2015   CLINICAL DATA:  Headaches with confusion and unsteady gait. Stroke risk factors include diabetes, hypertension, and prior stroke. Also history of monoclonal gammopathy.  EXAM:  MRI HEAD WITHOUT CONTRAST  TECHNIQUE: Multiplanar, multiecho pulse sequences of the brain and surrounding structures were obtained without intravenous contrast.  COMPARISON:  CT head 07/16/2015.  MR brain 10/21/2009.  FINDINGS: Multifocal areas of restricted diffusion, both punctate and confluent, affecting the LEFT frontal subcortical white matter, representing acute infarction. No hemorrhage, mass lesion, or extra-axial fluid.  Moderate cerebral and cerebellar atrophy. Extensive T2 and FLAIR hyperintensities throughout the white matter, likely small vessel disease. Remote hemorrhagic infarct LEFT thalamus. Roughly symmetric T2 hyperintensities of the globus pallidum, nonspecific. Prominent perivascular spaces, sequelae of hypertension.  Flow voids are maintained. RIGHT vertebral is dominant. Normal pituitary and cerebellar tonsils. Significant cervical spondylosis at C3-4 is suspected.  Extracranial soft tissues show no significant finding. No concerning osseous lesions to suggest a marrow replacement process. Asymmetric and poorly aerated RIGHT mastoid with noted previously, stable.  Compared with prior MR, there is progression of atrophy and small vessel disease. The LEFT thalamic hemorrhage has developed since 2010.  IMPRESSION: Multifocal areas of non hemorrhagic acute infarction affect the LEFT frontal subcortical white matter.  Advanced chronic microvascular ischemic change with generalized moderate atrophy.  Areas of chronic ischemia as described, most notable being the LEFT thalamic hemorrhagic lacunar infarct.   Electronically Signed   By: Staci Righter M.D.   On: 07/17/2015 18:40   Portable Chest 1 View  07/14/2015    CLINICAL DATA:  79 year old female with abdominal pain and nausea  EXAM: PORTABLE CHEST - 1 VIEW  COMPARISON:  Radiograph dated 07/01/2015  FINDINGS: Single-view of the chest demonstrate emphysematous changes of the lungs. No focal consolidation, pleural effusion, or pneumothorax. Stable cardiac silhouette. The osseous structures appear unremarkable.  IMPRESSION: No active disease.   Electronically Signed   By: Anner Crete M.D.   On: 07/14/2015 19:08   Dg Abd Acute W/chest  07/01/2015   CLINICAL DATA:  Patient with left-sided abdominal pain above colostomy bag. Persistent cough for multiple weeks.  EXAM: DG ABDOMEN ACUTE W/ 1V CHEST  COMPARISON:  Chest radiograph 03/09/2015  FINDINGS: Monitoring leads overlie the patient. Stable cardiac and mediastinal contours. No consolidative pulmonary opacities. No pleural effusion or pneumothorax.  Gas is demonstrated within nondilated loops of large and small bowel within the abdomen. The entire left hemi abdomen is not included on this radiograph however there may potentially be a left parastomal hernia. Lumbar spine degenerative changes.  IMPRESSION: The entire left hemi abdomen is not included on current radiograph however there may be a left parastomal hernia. No evidence for small bowel obstruction.  No acute cardiopulmonary process.   Electronically Signed   By: Lovey Newcomer M.D.   On: 07/01/2015 18:48   Mr Mrv Head Wo Cm  07/18/2015   CLINICAL DATA:  Intractable nausea and vomiting. Headache. Confusion. Speech disturbance. Left frontal infarctions.  EXAM: MR MRV HEAD WITHOUT CONTRAST  TECHNIQUE: Multiplanar, multisequence MR imaging was performed. No intravenous contrast was administered.  COMPARISON:  MRA same day. MRI yesterday. MRI with contrast 07/09/2004  FINDINGS: The study is significantly motion degraded. The sagittal sinus is patent. Internal cerebral veins and straight sinus are patent. Right transverse sinus is a large vessel that is patent. There  is diminutive flow in the left transverse sinus particularly when evaluating the parenchymal imaging, I think this is simply a small-vessel rather than a thrombosed sinus. This impression is confirmed by looking at the contrast-enhanced study from 2005, where this was a very diminutive vessel.  IMPRESSION: No evidence of venous thrombosis. Very  diminutive left transverse sinus as demonstrated on previous studies. I do not believe there is any venous thrombosis.   Electronically Signed   By: Nelson Chimes M.D.   On: 07/18/2015 22:12    Microbiology: Recent Results (from the past 240 hour(s))  MRSA PCR Screening     Status: None   Collection Time: 07/17/15 10:43 AM  Result Value Ref Range Status   MRSA by PCR NEGATIVE NEGATIVE Final    Comment:        The GeneXpert MRSA Assay (FDA approved for NASAL specimens only), is one component of a comprehensive MRSA colonization surveillance program. It is not intended to diagnose MRSA infection nor to guide or monitor treatment for MRSA infections.      Labs: Basic Metabolic Panel:  Recent Labs Lab 07/14/15 1854 07/15/15 0119 07/16/15 0506 07/17/15 0540 07/19/15 0856 07/21/15 0020  NA  --  134* 140 139 137 135  K  --  4.6 4.4 4.3 4.0 4.1  CL  --  112* 118* 116* 111 110  CO2  --  13* 15* 15* 14* 17*  GLUCOSE  --  237* 154* 179* 243* 159*  BUN  --  30* 23* 18 17 17   CREATININE 3.24* 3.01* 2.48* 2.27* 2.50* 2.56*  CALCIUM  --  8.5* 8.6* 8.5* 9.0 8.7*  MG 2.2  --   --   --   --   --    Liver Function Tests:  Recent Labs Lab 07/14/15 1435 07/15/15 0119 07/16/15 0506 07/17/15 0540  AST 51* 48* 45* 60*  ALT 37 31 27 30   ALKPHOS 172* 139* 119 117  BILITOT 0.4 0.3 0.7 0.5  PROT 7.8 6.2* 5.7* 5.8*  ALBUMIN 3.7 3.0* 2.8* 2.7*    Recent Labs Lab 07/14/15 1435  LIPASE 13*   No results for input(s): AMMONIA in the last 168 hours. CBC:  Recent Labs Lab 07/14/15 1435 07/15/15 0119 07/17/15 0540 07/19/15 0856 07/21/15 0020   WBC 9.9 6.9 7.8 6.3 5.3  HGB 13.0 11.2* 9.5* 10.9* 9.5*  HCT 38.6 33.7* 29.1* 33.2* 28.7*  MCV 95.1 94.1 94.2 94.6 93.2  PLT 221 169 163 163 165   Cardiac Enzymes:  Recent Labs Lab 07/14/15 1854 07/15/15 0119 07/15/15 0801  TROPONINI 0.04* 0.04* 0.04*   BNP: BNP (last 3 results)  Recent Labs  03/09/15 0224  BNP 257.8*    ProBNP (last 3 results) No results for input(s): PROBNP in the last 8760 hours.  CBG:  Recent Labs Lab 07/20/15 0641 07/20/15 1134 07/20/15 1623 07/20/15 2116 07/21/15 0631  GLUCAP 177* 205* 155* 185* 144*       Signed:  VANN, JESSICA  [facility] 07/21/2015, 10:10 AM

## 2015-07-21 NOTE — Progress Notes (Signed)
Occupational Therapy Treatment Patient Details Name: [name] MRN: [id number] DOB: 1935/11/17 Today's Date: 07/21/2015    History of present illness 79 yo female admitted with acute on chronic kidney failure, nausea, gastroparesis. Pt with increased lethargy, confusion and difficulty with word finding on 9/15 with MRI consistent with R frontal infarct.  Pt was [facility] from [facility] to [facility] Hx of CVA, stage IV kidney disease, HTN, DM   OT comments  Pt. Initially declined participation in skilled OT.  Reluctantly agreed but then stated she was glad she got up and oob.  Able to complete ub/lb ADLS with min/mod a.  Notable gains from previous documentation.  Will continue to follow acutely.    Follow Up Recommendations  SNF;Supervision/Assistance - 24 hour    Equipment Recommendations  None recommended by OT    Recommendations for Other Services      Precautions / Restrictions Precautions Precautions: Fall       Mobility Bed Mobility Overal bed mobility: Modified Independent Bed Mobility: Rolling;Sidelying to Sit           General bed mobility comments: hob flat, intermittent use of rail  Transfers Overall transfer level: Needs assistance Equipment used: Rolling walker (2 wheeled) Transfers: Sit to/from [facility] Sit to Stand: Min guard;Min assist Stand pivot transfers: Min guard            Balance                                   ADL Overall ADL's : Needs assistance/impaired     Grooming: Wash/dry hands;Wash/dry face;Oral care;Applying deodorant;Set up;Sitting   Upper Body Bathing: Minimal assitance;Sitting   Lower Body Bathing: Moderate assistance;Sit to/from stand;Cueing for sequencing;Cueing for safety   Upper Body Dressing : Minimal assistance;Sitting   Lower Body Dressing: Moderate assistance;Sitting/lateral leans   Toilet Transfer: Stand-pivot;Minimal assistance Toilet Transfer Details (indicate cue type and  reason): simulated eob to recliner Toileting- Clothing Manipulation and Hygiene: Minimal assistance;Sit to/from stand Toileting - Clothing Manipulation Details (indicate cue type and reason): performed pericare bathing in standing     Functional mobility during ADLs: Minimal assistance;Moderate assistance General ADL Comments: slow moving and slow processing of one step commands but was able to complete ub/lb b/d      Vision                     Perception     Praxis      Cognition   Behavior During Therapy: Flat affect Overall Cognitive Status: Impaired/Different from baseline                Problem Solving: Slow processing;Requires verbal cues;Requires tactile cues      Extremity/Trunk Assessment               Exercises     Shoulder Instructions       General Comments      Pertinent Vitals/ Pain       Pain Assessment: No/denies pain  Home Living                                          Prior Functioning/Environment              Frequency Min 2X/week     Progress Toward Goals  OT Goals(current goals can  now be found in the care plan section)  Progress towards OT goals: Progressing toward goals     Plan      Co-evaluation                 End of Session Equipment Utilized During Treatment: Rolling walker   Activity Tolerance Patient tolerated treatment well   Patient Left in chair;with nursing/sitter in room   Nurse Communication          Time: 7353-2992 OT Time Calculation (min): 23 min  Charges: OT General Charges $OT Visit: 1 Procedure OT Treatments $Self Care/Home Management : 23-37 mins  Janice Coffin, COTA/L 07/21/2015, 9:01 AM

## 2015-07-21 NOTE — Progress Notes (Signed)
Patient is discharged from room 5C06 at this time. Alert and in stable condition. IV site d/c'd as well as tele. Report given to receiving nurse Earleen Newport, RN at [facility]. Transported via Biomedical scientist by [facility] with husband and all belongings at side.

## 2015-07-21 NOTE — Clinical Social Work Placement (Signed)
   CLINICAL SOCIAL WORK PLACEMENT  NOTE  Date:  07/21/2015  Patient Details  Name: [name] MRN: [id number] Date of Birth: 26-May-1936  Clinical Social Work is seeking post-discharge placement for this patient at the [place] level of care (*CSW will initial, date and re-position this form in  chart as items are completed):  Yes   Patient/family provided with [facility] Work Department's list of facilities offering this level of care within the geographic area requested by the patient (or if unable, by the patient's family).  Yes   Patient/family informed of their freedom to choose among providers that offer the needed level of care, that participate in Medicare, Medicaid or managed care program needed by the patient, have an available bed and are willing to accept the patient.  Yes   Patient/family informed of [facility]'s ownership interest in [facility] and [facility] as well as of the fact that they are under no obligation to receive care at these facilities.  PASRR submitted to EDS on 07/20/15     PASRR number received on 07/20/15     Existing PASRR number confirmed on       FL2 transmitted to all facilities in geographic area requested by pt/family on 07/20/15     FL2 transmitted to all facilities within larger geographic area on 07/20/15     Patient informed that his/her managed care company has contracts with or will negotiate with certain facilities, including the following:        Yes   Patient/family informed of bed offers received.  Patient chooses bed at  [facility] )     Physician recommends and patient chooses bed at      Patient to be transferred to  [facility] ) on 07/21/15.  Patient to be transferred to facility by  [facility] )     Patient family notified on 07/21/15 of transfer.  Name of family member notified:   (Pt's husband, Dhwani Venkatesh [phone])     PHYSICIAN Please sign FL2      Additional Comment:    _______________________________________________ Glendon Axe, MSW, LCSWA [phone] 07/21/2015 11:06 AM

## 2015-07-22 ENCOUNTER — Telehealth: Payer: Self-pay | Admitting: Family Medicine

## 2015-07-22 ENCOUNTER — Encounter: Payer: Self-pay | Admitting: Gastroenterology

## 2015-07-23 ENCOUNTER — Telehealth: Payer: Self-pay | Admitting: Pharmacist

## 2015-07-23 NOTE — Telephone Encounter (Signed)
Tried to call - left message for [employer].

## 2015-07-24 ENCOUNTER — Encounter: Payer: Self-pay | Admitting: Family Medicine

## 2015-07-24 NOTE — Telephone Encounter (Signed)
Sent to  Tammy 07-24-15

## 2015-07-24 NOTE — Telephone Encounter (Signed)
It appears Humalog 50/50 mix insulin was changed in hospital to Lantus and Novolog.   I spoke to patient's husband and Merchandiser, retail at [facility].  I recommend that she continue to administer Lantus 20 units daily and Novolog per sliding scale.

## 2015-07-24 NOTE — Telephone Encounter (Signed)
According to chart this has already been done.  Please send this again.

## 2015-07-30 ENCOUNTER — Telehealth: Payer: Self-pay | Admitting: Family Medicine

## 2015-07-30 NOTE — Telephone Encounter (Signed)
Information given to patient's husband regarding previous insulin regimen.   Humalog Mix 50/50 70 units prior to breakfast and 45 units prior to supper  SLIDING SCALE (DOB:  [birth year]) Use plain Humalog prior to lunch  Blood glucose  Amount of insulin  71 - 120 5 units  121 - 175 7 units  176 - 200 8 units  201 - 225 10 units  226 to 250 11 units  251 or above 12 units

## 2015-07-31 ENCOUNTER — Encounter: Payer: Self-pay | Admitting: Internal Medicine

## 2015-08-04 ENCOUNTER — Other Ambulatory Visit (HOSPITAL_BASED_OUTPATIENT_CLINIC_OR_DEPARTMENT_OTHER): Payer: Medicare Other

## 2015-08-04 ENCOUNTER — Telehealth: Payer: Self-pay | Admitting: *Deleted

## 2015-08-04 ENCOUNTER — Encounter: Payer: Self-pay | Admitting: Family Medicine

## 2015-08-04 ENCOUNTER — Encounter: Payer: Self-pay | Admitting: Hematology and Oncology

## 2015-08-04 DIAGNOSIS — D472 Monoclonal gammopathy: Secondary | ICD-10-CM

## 2015-08-04 LAB — CBC WITH DIFFERENTIAL/PLATELET
BASO%: 0.8 % (ref 0.0–2.0)
Basophils Absolute: 0.1 10*3/uL (ref 0.0–0.1)
EOS ABS: 0.2 10*3/uL (ref 0.0–0.5)
EOS%: 3 % (ref 0.0–7.0)
HEMATOCRIT: 35.8 % (ref 34.8–46.6)
HGB: 11.8 g/dL (ref 11.6–15.9)
LYMPH#: 1.3 10*3/uL (ref 0.9–3.3)
LYMPH%: 18 % (ref 14.0–49.7)
MCH: 31.6 pg (ref 25.1–34.0)
MCHC: 33.1 g/dL (ref 31.5–36.0)
MCV: 95.5 fL (ref 79.5–101.0)
MONO#: 0.8 10*3/uL (ref 0.1–0.9)
MONO%: 11.4 % (ref 0.0–14.0)
NEUT%: 66.8 % (ref 38.4–76.8)
NEUTROS ABS: 4.7 10*3/uL (ref 1.5–6.5)
PLATELETS: 220 10*3/uL (ref 145–400)
RBC: 3.74 10*6/uL (ref 3.70–5.45)
RDW: 14.3 % (ref 11.2–14.5)
WBC: 7 10*3/uL (ref 3.9–10.3)

## 2015-08-04 LAB — COMPREHENSIVE METABOLIC PANEL (CC13)
ALBUMIN: 3.1 g/dL — AB (ref 3.5–5.0)
ALK PHOS: 240 U/L — AB (ref 40–150)
ALT: 71 U/L — AB (ref 0–55)
ANION GAP: 8 meq/L (ref 3–11)
AST: 70 U/L — ABNORMAL HIGH (ref 5–34)
BILIRUBIN TOTAL: 0.36 mg/dL (ref 0.20–1.20)
BUN: 27 mg/dL — ABNORMAL HIGH (ref 7.0–26.0)
CALCIUM: 9.5 mg/dL (ref 8.4–10.4)
CO2: 17 mEq/L — ABNORMAL LOW (ref 22–29)
CREATININE: 3.1 mg/dL — AB (ref 0.6–1.1)
Chloride: 111 mEq/L — ABNORMAL HIGH (ref 98–109)
EGFR: 14 mL/min/{1.73_m2} — AB (ref >90)
Glucose: 215 mg/dl — ABNORMAL HIGH (ref 70–140)
Potassium: 4.8 mEq/L (ref 3.5–5.1)
Sodium: 136 mEq/L (ref 136–145)
TOTAL PROTEIN: 6.7 g/dL (ref 6.4–8.3)

## 2015-08-04 LAB — LACTATE DEHYDROGENASE (CC13): LDH: 209 U/L (ref 125–245)

## 2015-08-04 NOTE — Telephone Encounter (Signed)
Faxed CMET to Dr Florene Glen and Dr Laurance Flatten per Dr Alvy Bimler request. Instructed patient to drink more fluids

## 2015-08-05 ENCOUNTER — Encounter: Payer: Self-pay | Admitting: *Deleted

## 2015-08-06 ENCOUNTER — Encounter: Payer: Self-pay | Admitting: Family Medicine

## 2015-08-06 LAB — SPEP & IFE WITH QIG
ABNORMAL PROTEIN BAND1: 0.3 g/dL
ALBUMIN ELP: 3.3 g/dL — AB (ref 3.8–4.8)
ALPHA-1-GLOBULIN: 0.3 g/dL (ref 0.2–0.3)
ALPHA-2-GLOBULIN: 1.1 g/dL — AB (ref 0.5–0.9)
BETA GLOBULIN: 0.4 g/dL (ref 0.4–0.6)
Beta 2: 0.6 g/dL — ABNORMAL HIGH (ref 0.2–0.5)
GAMMA GLOBULIN: 0.8 g/dL (ref 0.8–1.7)
IgA: 526 mg/dL — ABNORMAL HIGH (ref 69–380)
IgG (Immunoglobin G), Serum: 745 mg/dL (ref 690–1700)
IgM, Serum: 140 mg/dL (ref 52–322)
Total Protein, Serum Electrophoresis: 6.5 g/dL (ref 6.1–8.1)

## 2015-08-06 LAB — BETA 2 MICROGLOBULIN, SERUM: Beta-2 Microglobulin: 8.85 mg/L — ABNORMAL HIGH (ref <=2.51)

## 2015-08-06 LAB — KAPPA/LAMBDA LIGHT CHAINS
KAPPA FREE LGHT CHN: 13.3 mg/dL — AB (ref 0.33–1.94)
Kappa:Lambda Ratio: 4.02 — ABNORMAL HIGH (ref 0.26–1.65)
LAMBDA FREE LGHT CHN: 3.31 mg/dL — AB (ref 0.57–2.63)

## 2015-08-07 ENCOUNTER — Telehealth: Payer: Self-pay | Admitting: Family Medicine

## 2015-08-07 LAB — UPEP/TP, 24-HR URINE
ALBUMIN UR 24 HR ELECTRO: 57.3 %
Alpha-1-Globulin, U: 20.4 %
Alpha-2-Globulin, U: 5.6 %
BETA GLOBULIN, U: 7.6 %
Collection Interval: 24 hours
Gamma Globulin, U: 9.1 %
TOTAL PROTEIN, URINE/DAY: 624 mg/d — AB (ref 50–100)
TOTAL PROTEIN, URINE: 208 mg/dL
Total Volume, Urine: 300 mL

## 2015-08-11 LAB — UIFE/LIGHT CHAINS/TP QN, 24-HR UR
ALBUMIN, U: DETECTED
ALPHA 1 UR: DETECTED — AB
ALPHA 2 UR: DETECTED — AB
Beta, Urine: DETECTED — AB
Gamma Globulin, Urine: DETECTED — AB
TIME-UPE24: 24 h
Total Protein, Urine: 201 mg/dL — ABNORMAL HIGH (ref 5–24)
Volume, Urine: 300 mL

## 2015-08-11 LAB — 24 HR URINE,KAPPA/LAMBDA LIGHT CHAINS
MEASURED LAMBDA CHAIN: 2.38 mg/dL — AB (ref <2.00)
Measured Kappa Chain: 5.44 mg/dL — ABNORMAL HIGH (ref <2.00)
TOTAL LAMBDA CHAIN: 7.14 mg/(24.h)
Total Kappa Chain: 16.32 mg/24 h
URINE VOLUME: 300 mL/(24.h)

## 2015-08-12 NOTE — Telephone Encounter (Signed)
Barnett Applebaum already addressed

## 2015-08-15 ENCOUNTER — Telehealth: Payer: Self-pay | Admitting: Hematology and Oncology

## 2015-08-15 ENCOUNTER — Ambulatory Visit (HOSPITAL_BASED_OUTPATIENT_CLINIC_OR_DEPARTMENT_OTHER): Payer: Medicare Other | Admitting: Hematology and Oncology

## 2015-08-15 ENCOUNTER — Encounter: Payer: Self-pay | Admitting: Hematology and Oncology

## 2015-08-15 VITALS — BP 152/79 | HR 51 | Temp 98.1°F | Resp 17 | Ht 61.0 in | Wt 172.2 lb

## 2015-08-15 DIAGNOSIS — N184 Chronic kidney disease, stage 4 (severe): Secondary | ICD-10-CM

## 2015-08-15 DIAGNOSIS — D472 Monoclonal gammopathy: Secondary | ICD-10-CM | POA: Diagnosis not present

## 2015-08-15 DIAGNOSIS — I639 Cerebral infarction, unspecified: Secondary | ICD-10-CM

## 2015-08-15 NOTE — Assessment & Plan Note (Signed)
The IgA kappa is stable. Clinically, he had no signs of organ damage except the chronic kidney disease which I believe is unrelated to the MGUS. The patient is not even anemic. At present time, I plan to see her annually with history, physical examination and blood work.

## 2015-08-15 NOTE — Assessment & Plan Note (Signed)
The patient suffered from stroke with mild dysphagia. She is currently in a skilled nursing facility. Continue medical management

## 2015-08-15 NOTE — Progress Notes (Signed)
[facility] OFFICE PROGRESS NOTE  Patient Care Team: Chipper Herb, MD as PCP - General (Family Medicine) Estanislado Emms, MD as Consulting Physician (Nephrology) Heath Lark, MD as Consulting Physician (Hematology and Oncology)  SUMMARY OF ONCOLOGIC HISTORY:  [name] is here because of proteinuria, chronic kidney disease, and anemia, concern for monoclonal paraproteinemia She denies history of abnormal bone pain or bone fracture. She has chronic back pain from degenerative arthritis.  Patient denies history of recurrent infection or atypical infections such as shingles of meningitis. Denies chills, night sweats, anorexia or abnormal weight loss. The patient has poorly controlled diabetes. She had renal dysfunction many years ago and was on temporary hemodialysis. The patient have colostomy long-term. She was seen in November 2015 with extensive workup which showed she have IgA kappa MGUS. She is observed. In September 2016, the patient suffered from stroke  INTERVAL HISTORY: History is via caregiver Please see below for problem oriented charting. The patient is rather debilitated and could speak very little due to recent stroke and dysphagia. There were no reported recent fractures. No reported recent infection.  REVIEW OF SYSTEMS:   Constitutional: Denies fevers, chills or abnormal weight loss Eyes: Denies blurriness of vision Ears, nose, mouth, throat, and face: Denies mucositis or sore throat Respiratory: Denies cough, dyspnea or wheezes Cardiovascular: Denies palpitation, chest discomfort or lower extremity swelling Gastrointestinal:  Denies nausea, heartburn or change in bowel habits Skin: Denies abnormal skin rashes Lymphatics: Denies new lymphadenopathy or easy bruising Behavioral/Psych: Mood is stable, no new changes  All other systems were reviewed with the patient and are negative.  I have reviewed the past medical history, past surgical history,  social history and family history with the patient and they are unchanged from previous note.  ALLERGIES:  is allergic to ace inhibitors; actos; aspirin; codeine; hydromorphone hcl; metformin and related; penicillins; procaine hcl; statins; and sulfa antibiotics.  MEDICATIONS:  Current Outpatient Prescriptions  Medication Sig Dispense Refill  . acetaminophen (TYLENOL) 500 MG tablet Take 1,000 mg by mouth every 6 (six) hours as needed for moderate pain.     Marland Kitchen amLODipine (NORVASC) 5 MG tablet Take 5 mg by mouth daily.    . B Complex-C-Folic Acid (MULTIVITAMIN, STRESS FORMULA) tablet Take 1 tablet by mouth daily.      . clopidogrel (PLAVIX) 75 MG tablet Take 1 tablet (75 mg total) by mouth daily.    . feeding supplement, ENSURE ENLIVE, (ENSURE ENLIVE) LIQD Take 237 mLs by mouth 2 (two) times daily after a meal. 237 mL 12  . glucagon (GLUCAGON EMERGENCY) 1 MG injection Inject into the muscle as needed for severe low BG 1 each 1  . insulin aspart (NOVOLOG) 100 UNIT/ML injection Inject 0-9 Units into the skin 3 (three) times daily with meals. 10 mL 11  . insulin glargine (LANTUS) 100 UNIT/ML injection Inject 0.2 mLs (20 Units total) into the skin daily. (Patient taking differently: Inject 24 Units into the skin at bedtime. ) 10 mL 11  . Insulin Pen Needle (PEN NEEDLES 31GX5/16") 31G X 8 MM MISC USE AS DIRECTED TWICE A DAY (Patient taking differently: 1 each by Other route 3 (three) times daily. USE AS DIRECTED TWICE A DAY) 100 each 11  . Lancets (ONETOUCH ULTRASOFT) lancets CHECK BLOOD SUGAR 4 TIMES A DAY 100 each 1  . levothyroxine (SYNTHROID, LEVOTHROID) 50 MCG tablet TAKE 1 TABLET DAILY 30 tablet 11  . metoprolol (LOPRESSOR) 50 MG tablet Take 50 mg by mouth  2 (two) times daily.    Marland Kitchen omega-3 acid ethyl esters (LOVAZA) 1 G capsule TAKE (2) CAPSULES TWICE DAILY. 120 capsule 5  . ondansetron (ZOFRAN) 4 MG tablet Take 4 mg by mouth every 6 (six) hours.    . ONE TOUCH ULTRA TEST test strip Test blood  sugar qid prn. DX E11.8 125 each 5  . pantoprazole (PROTONIX) 40 MG tablet TAKE (1) TABLET TWICE A DAY. 60 tablet 2  . Polyethyl Glycol-Propyl Glycol 0.4-0.3 % SOLN Apply 1 drop to eye 3 (three) times daily as needed (dry eyes).    . pravastatin (PRAVACHOL) 20 MG tablet Take 1 tablet (20 mg total) by mouth daily at 6 PM.    . pyridOXINE (B-6) 50 MG tablet Take 1 tablet (50 mg total) by mouth daily.    . traMADol (ULTRAM) 50 MG tablet Take 1 tablet (50 mg total) by mouth every 6 (six) hours as needed. 60 tablet 0   No current facility-administered medications for this visit.    PHYSICAL EXAMINATION: ECOG PERFORMANCE STATUS: 3 - Symptomatic, >50% confined to bed  Filed Vitals:   08/15/15 1242  BP: 152/79  Pulse: 51  Temp: 98.1 F (36.7 C)  Resp: 17   Filed Weights   08/15/15 1242  Weight: 172 lb 3.2 oz (78.109 kg)    GENERAL:alert, no distress and comfortable. She apparently debilitated, sitting on the wheelchair SKIN: skin color, texture, turgor are normal, no rashes or significant lesions EYES: normal, Conjunctiva are pink and non-injected, sclera clear Musculoskeletal:no cyanosis of digits and no clubbing  NEURO: alert & oriented x 3 with mild dysphasia, neuro-logical examination not performed due to debility  LABORATORY DATA:  I have reviewed the data as listed    Component Value Date/Time   NA 136 08/04/2015 1001   NA 135 07/21/2015 0020   NA 137 06/10/2015 0956   K 4.8 08/04/2015 1001   K 4.1 07/21/2015 0020   CL 110 07/21/2015 0020   CO2 17* 08/04/2015 1001   CO2 17* 07/21/2015 0020   GLUCOSE 215* 08/04/2015 1001   GLUCOSE 159* 07/21/2015 0020   GLUCOSE 280* 06/10/2015 0956   BUN 27.0* 08/04/2015 1001   BUN 17 07/21/2015 0020   BUN 24 06/10/2015 0956   CREATININE 3.1* 08/04/2015 1001   CREATININE 2.56* 07/21/2015 0020   CREATININE 1.36* 03/29/2013 1240   CALCIUM 9.5 08/04/2015 1001   CALCIUM 8.7* 07/21/2015 0020   CALCIUM 8.1* 08/27/2009 1319   PROT 6.7  08/04/2015 1001   PROT 5.8* 07/17/2015 0540   PROT 6.8 06/10/2015 0956   ALBUMIN 3.1* 08/04/2015 1001   ALBUMIN 2.7* 07/17/2015 0540   ALBUMIN 3.8 06/10/2015 0956   AST 70* 08/04/2015 1001   AST 60* 07/17/2015 0540   ALT 71* 08/04/2015 1001   ALT 30 07/17/2015 0540   ALKPHOS 240* 08/04/2015 1001   ALKPHOS 117 07/17/2015 0540   BILITOT 0.36 08/04/2015 1001   BILITOT 0.5 07/17/2015 0540   BILITOT 0.3 06/10/2015 0956   GFRNONAA 17* 07/21/2015 0020   GFRNONAA 38* 03/29/2013 1240   GFRAA 20* 07/21/2015 0020   GFRAA 44* 03/29/2013 1240    No results found for: SPEP, UPEP  Lab Results  Component Value Date   WBC 7.0 08/04/2015   NEUTROABS 4.7 08/04/2015   HGB 11.8 08/04/2015   HCT 35.8 08/04/2015   MCV 95.5 08/04/2015   PLT 220 08/04/2015      Chemistry      Component Value Date/Time   NA  136 08/04/2015 1001   NA 135 07/21/2015 0020   NA 137 06/10/2015 0956   K 4.8 08/04/2015 1001   K 4.1 07/21/2015 0020   CL 110 07/21/2015 0020   CO2 17* 08/04/2015 1001   CO2 17* 07/21/2015 0020   BUN 27.0* 08/04/2015 1001   BUN 17 07/21/2015 0020   BUN 24 06/10/2015 0956   CREATININE 3.1* 08/04/2015 1001   CREATININE 2.56* 07/21/2015 0020   CREATININE 1.36* 03/29/2013 1240      Component Value Date/Time   CALCIUM 9.5 08/04/2015 1001   CALCIUM 8.7* 07/21/2015 0020   CALCIUM 8.1* 08/27/2009 1319   ALKPHOS 240* 08/04/2015 1001   ALKPHOS 117 07/17/2015 0540   AST 70* 08/04/2015 1001   AST 60* 07/17/2015 0540   ALT 71* 08/04/2015 1001   ALT 30 07/17/2015 0540   BILITOT 0.36 08/04/2015 1001   BILITOT 0.5 07/17/2015 0540   BILITOT 0.3 06/10/2015 0956     ASSESSMENT & PLAN:  MGUS (monoclonal gammopathy of unknown significance) The IgA kappa is stable. Clinically, he had no signs of organ damage except the chronic kidney disease which I believe is unrelated to the MGUS. The patient is not even anemic. At present time, I plan to see her annually with history, physical  examination and blood work.    Chronic kidney disease, stage IV (severe) (HCC) This is likely due to diabetic nephropathy and poorly controlled hypertension She will continue close follow-up with her nephrologist.      Ischemic stroke [facility]) The patient suffered from stroke with mild dysphagia. She is currently in a skilled nursing facility. Continue medical management   Orders Placed This Encounter  Procedures  . Comprehensive metabolic panel    Standing Status: Future     Number of Occurrences:      Standing Expiration Date: 09/18/2016  . CBC with Differential/Platelet    Standing Status: Future     Number of Occurrences:      Standing Expiration Date: 09/18/2016  . SPEP & IFE with QIG    Standing Status: Future     Number of Occurrences:      Standing Expiration Date: 09/18/2016  . Kappa/lambda light chains    Standing Status: Future     Number of Occurrences:      Standing Expiration Date: 09/18/2016   All questions were answered. The patient knows to call the clinic with any problems, questions or concerns. No barriers to learning was detected. I spent 15 minutes counseling the patient face to face. The total time spent in the appointment was 20 minutes and more than 50% was on counseling and review of test results     [facility], Jakori Burkett, MD 08/15/2015 1:12 PM

## 2015-08-15 NOTE — Telephone Encounter (Signed)
per pof to sch pt appt-gave pt copy of avs °

## 2015-08-15 NOTE — Assessment & Plan Note (Signed)
This is likely due to diabetic nephropathy and poorly controlled hypertension She will continue close follow-up with her nephrologist.     

## 2015-08-18 ENCOUNTER — Telehealth: Payer: Self-pay | Admitting: Family Medicine

## 2015-08-18 ENCOUNTER — Encounter: Payer: Self-pay | Admitting: Internal Medicine

## 2015-08-18 MED ORDER — INSULIN GLARGINE 100 UNIT/ML SOLOSTAR PEN: 26.0000 [IU] | PEN_INJECTOR | Freq: Every evening | SUBCUTANEOUS | Status: DC

## 2015-08-18 MED ORDER — INSULIN LISPRO 100 UNIT/ML (KWIKPEN): 5.0000 [IU] | PEN_INJECTOR | Freq: Three times a day (TID) | SUBCUTANEOUS | Status: DC

## 2015-08-18 NOTE — Telephone Encounter (Signed)
Patient received vial of lantus 24 units qhs and humalog 4 units tid prior to each meal at discharge from nursing home.  Has about 2-3 days left but only 2 syringes.  Usually uses pens to inject insulin.  Patient's BG have been as follows: 250, 298, 192, 200, 259, 167, 197 Recommend increase Lantus to 26 units each evening;  Increase humalog to 5 units tid.  Rx for pens sent to pharmacy Will leave #20 samples for syringes for patient to allow her to finish insulin in vials.

## 2015-08-19 ENCOUNTER — Telehealth: Payer: Self-pay | Admitting: *Deleted

## 2015-08-19 MED ORDER — PRAVASTATIN SODIUM 20 MG PO TABS: 20.0000 mg | ORAL_TABLET | Freq: Every day | ORAL | Status: DC

## 2015-08-19 MED ORDER — CLOPIDOGREL BISULFATE 75 MG PO TABS: 75.0000 mg | ORAL_TABLET | Freq: Every day | ORAL | Status: DC

## 2015-08-19 NOTE — Telephone Encounter (Signed)
Pt was discharged on pravastatin 20mg  and Plavix 75mg . These are new orders and need to be sent to [facility], if she is to continue these

## 2015-08-19 NOTE — Telephone Encounter (Signed)
meds sent to pharm - were already in med list

## 2015-08-22 ENCOUNTER — Encounter: Payer: Self-pay | Admitting: Family Medicine

## 2015-08-22 ENCOUNTER — Ambulatory Visit (INDEPENDENT_AMBULATORY_CARE_PROVIDER_SITE_OTHER): Payer: Medicare Other | Admitting: Family Medicine

## 2015-08-22 VITALS — BP 157/75 | HR 56 | Temp 97.5°F | Ht 61.0 in | Wt 172.0 lb

## 2015-08-22 DIAGNOSIS — N184 Chronic kidney disease, stage 4 (severe): Secondary | ICD-10-CM | POA: Diagnosis not present

## 2015-08-22 DIAGNOSIS — R2681 Unsteadiness on feet: Secondary | ICD-10-CM | POA: Diagnosis not present

## 2015-08-22 DIAGNOSIS — E559 Vitamin D deficiency, unspecified: Secondary | ICD-10-CM

## 2015-08-22 DIAGNOSIS — I639 Cerebral infarction, unspecified: Secondary | ICD-10-CM | POA: Diagnosis not present

## 2015-08-22 DIAGNOSIS — K3184 Gastroparesis: Secondary | ICD-10-CM

## 2015-08-22 DIAGNOSIS — Z794 Long term (current) use of insulin: Secondary | ICD-10-CM

## 2015-08-22 DIAGNOSIS — N189 Chronic kidney disease, unspecified: Secondary | ICD-10-CM

## 2015-08-22 DIAGNOSIS — Z741 Need for assistance with personal care: Secondary | ICD-10-CM

## 2015-08-22 DIAGNOSIS — N179 Acute kidney failure, unspecified: Secondary | ICD-10-CM

## 2015-08-22 DIAGNOSIS — E118 Type 2 diabetes mellitus with unspecified complications: Secondary | ICD-10-CM

## 2015-08-22 DIAGNOSIS — I10 Essential (primary) hypertension: Secondary | ICD-10-CM | POA: Diagnosis not present

## 2015-08-22 DIAGNOSIS — E785 Hyperlipidemia, unspecified: Secondary | ICD-10-CM | POA: Diagnosis not present

## 2015-08-22 DIAGNOSIS — E1143 Type 2 diabetes mellitus with diabetic autonomic (poly)neuropathy: Secondary | ICD-10-CM

## 2015-08-22 DIAGNOSIS — E440 Moderate protein-calorie malnutrition: Secondary | ICD-10-CM

## 2015-08-22 LAB — POCT GLYCOSYLATED HEMOGLOBIN (HGB A1C): HEMOGLOBIN A1C: 7.7

## 2015-08-22 NOTE — Progress Notes (Signed)
Subjective:    Patient ID: [name], female    DOB: [birth year], 79 y.o.   MRN: [id number]  HPI Patient here today for follow up from hospital and skilled care facility. She is accompanied today by her husband. The patient was recently admitted to the hospital with continued and ongoing severe abdominal pain. Fall in the hospital she sustained another CVA. When she was discharged from the hospital she was sent to the nursing home for rehabilitation and recuperation. She is now back at home with her husband. She has more difficulty with a simulating her thoughts and now has trouble managing her medications and it is in need of much more care at home. She also has fatigue and cold intolerance. The patient today denies any chest pain or shortness of breath. Her memory is somewhat worse. She is able to use her walker around the house but is unable to take care of her daily personal needs. Hopefully this visit will help to arrange for her to have home nursing care visits and assistance through home nursing care. This is a face-to-face visit hopefully for this purpose. She continues to have her colostomy bag which her husband now has to change himself. She is no longer able to take care of her medicines without help from him. She is pretty much total care at home. She did know who I was and call my name and responded appropriately today. The stroke has weakened her in general . The patient didn't bring in home blood sugar readings which will be scanned into her record.      Patient Active Problem List   Diagnosis Date Noted  . Ischemic stroke ([place]) 08/15/2015  . HLD (hyperlipidemia)   . Type 2 diabetes mellitus with other circulatory complications ([place])   . Gastric polyp   . Nausea with vomiting   . Malnutrition of moderate degree ([place]) 07/15/2015  . Acute on chronic kidney failure ([place]) 07/14/2015  . Acute renal failure superimposed on stage 4 chronic kidney disease ([place]) 07/14/2015  .  Precordial chest pain 05/21/2015  . Gastroparesis due to DM ([place]) 01/16/2015  . Gastric polyps 10/11/2014  . MGUS (monoclonal gammopathy of unknown significance) 10/10/2014  . Proteinuria 09/23/2014  . Vitamin D insufficiency 03/29/2013  . Chronic kidney disease, stage IV (severe) ([place]) 09/14/2011  . CVA (cerebral infarction), past history 09/14/2011  . 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 DIABETES MELLITUS, TYPE II 08/12/2008  . HYPERLIPIDEMIA 08/12/2008  . Essential hypertension 08/12/2008  . GERD 08/12/2008  . OSTEOARTHRITIS, lumbar spine 08/12/2008  . Osteoporosis, postmenopausal 08/12/2008  . KNEE REPLACEMENT, RIGHT, HX OF 08/12/2008   Outpatient Encounter Prescriptions as of 08/22/2015  Medication Sig  . acetaminophen (TYLENOL) 500 MG tablet Take 1,000 mg by mouth every 6 (six) hours as needed for moderate pain.   Marland Kitchen amLODipine (NORVASC) 5 MG tablet Take 5 mg by mouth daily.  . B Complex-C-Folic Acid (MULTIVITAMIN, STRESS FORMULA) tablet Take 1 tablet by mouth daily.    . clopidogrel (PLAVIX) 75 MG tablet Take 1 tablet (75 mg total) by mouth daily.  . feeding supplement, ENSURE ENLIVE, (ENSURE ENLIVE) LIQD Take 237 mLs by mouth 2 (two) times daily after a meal.  . glucagon (GLUCAGON EMERGENCY) 1 MG injection Inject into the muscle as needed for severe low BG  . Insulin Glargine (LANTUS SOLOSTAR) 100 UNIT/ML Solostar Pen Inject 26 Units into the skin every evening.  . insulin lispro (HUMALOG KWIKPEN) 100 UNIT/ML KiwkPen Inject 0.05-0.1 mLs (  5-10 Units total) into the skin 3 (three) times daily.  . Insulin Pen Needle (PEN NEEDLES 31GX5/16") 31G X 8 MM MISC USE AS DIRECTED TWICE A DAY (Patient taking differently: 1 each by Other route 3 (three) times daily. USE AS DIRECTED TWICE A DAY)  . Lancets (ONETOUCH ULTRASOFT) lancets CHECK BLOOD SUGAR 4 TIMES A DAY  . levothyroxine (SYNTHROID, LEVOTHROID) 50 MCG tablet TAKE 1 TABLET DAILY  . metoprolol (LOPRESSOR) 50 MG tablet  Take 50 mg by mouth 2 (two) times daily.  Marland Kitchen omega-3 acid ethyl esters (LOVAZA) 1 G capsule TAKE (2) CAPSULES TWICE DAILY.  Marland Kitchen ondansetron (ZOFRAN) 4 MG tablet Take 4 mg by mouth every 6 (six) hours.  . ONE TOUCH ULTRA TEST test strip Test blood sugar qid prn. DX E11.8  . pantoprazole (PROTONIX) 40 MG tablet TAKE (1) TABLET TWICE A DAY.  Vladimir Faster Glycol-Propyl Glycol 0.4-0.3 % SOLN Apply 1 drop to eye 3 (three) times daily as needed (dry eyes).  . pravastatin (PRAVACHOL) 20 MG tablet Take 1 tablet (20 mg total) by mouth daily at 6 PM.  . pyridOXINE (B-6) 50 MG tablet Take 1 tablet (50 mg total) by mouth daily.  . traMADol (ULTRAM) 50 MG tablet Take 1 tablet (50 mg total) by mouth every 6 (six) hours as needed.   No facility-administered encounter medications on file as of 08/22/2015.      Review of Systems  Constitutional: Positive for fatigue.       Unsteady gait Unable to manage meds Trouble formulating thoughts and following instructions Needs assistance with bathing and dressing  HENT: Negative.   Eyes: Negative.   Respiratory: Negative.   Cardiovascular: Negative.   Gastrointestinal: Negative.   Endocrine: Positive for cold intolerance.  Genitourinary: Negative.   Musculoskeletal: Negative.   Skin: Negative.   Allergic/Immunologic: Negative.   Neurological: Positive for weakness.  Hematological: Negative.   Psychiatric/Behavioral: Negative.        Objective:   Physical Exam  Constitutional: She is oriented to person, place, and time. No distress.  The patient is somewhat somnolent and depressed appearing.  HENT:  Head: Normocephalic and atraumatic.  Right Ear: External ear normal.  Left Ear: External ear normal.  Nose: Nose normal.  Mouth/Throat: Oropharynx is clear and moist.  Eyes: Conjunctivae and EOM are normal. Pupils are equal, round, and reactive to light. Right eye exhibits no discharge. Left eye exhibits no discharge. No scleral icterus.  Neck: Normal  range of motion. Neck supple. No thyromegaly present.  No thyromegaly or anterior cervical adenopathy  Cardiovascular: Normal rate, regular rhythm, normal heart sounds and intact distal pulses.   No murmur heard. The heart had a regular rate and rhythm at 72/m  Pulmonary/Chest: Effort normal and breath sounds normal. No respiratory distress. She has no wheezes. She has no rales. She exhibits no tenderness.  Clear anteriorly and posteriorly  Abdominal: Soft. Bowel sounds are normal. She exhibits no mass. There is tenderness. There is no rebound and no guarding.  There was only slight tenderness to palpation. The patient has a colostomy bag which appears to be draining normal soft stool.  Musculoskeletal: Normal range of motion. She exhibits no edema.  Lymphadenopathy:    She has no cervical adenopathy.  Neurological: She is alert and oriented to person, place, and time. She has normal reflexes.  Skin: Skin is warm and dry. No rash noted.  Dry skin in general  Psychiatric: Judgment and thought content normal.  Mood and affect were somewhat  depressed  Nursing note and vitals reviewed.  BP 157/75 mmHg  Pulse 56  Temp(Src) 97.5 F (36.4 C) (Oral)  Ht '5\' 1"'  (1.549 m)  Wt 172 lb (78.019 kg)  BMI 32.52 kg/m2        Assessment & Plan:  1. Acute on chronic kidney failure ([place]) -Follow-up with nephrology as planned - BMP8+EGFR - CBC with Differential/Platelet  2. Chronic kidney disease, stage IV (severe) ([place]) -Follow up with nephrology as planned - BMP8+EGFR - CBC with Differential/Platelet  3. Essential hypertension -The systolic blood pressure is elevated today and she will be seeing the nephrologist soon. We will also hopefully get some readings from the home health nurse once this can be arranged to monitor blood pressures at home - BMP8+EGFR - CBC with Differential/Platelet - Hepatic function panel  4. Gastroparesis due to DM [facility]) -This is an ongoing issue and the patient  has had multiple gastro-enterology visits and we will continue to monitor this. - CBC with Differential/Platelet  5. HLD (hyperlipidemia) -Continue pravastatin pending results of lab work - CBC with Differential/Platelet  6. Ischemic stroke ([place]) -Follow up with neurology as planned - CBC with Differential/Platelet  7. Vitamin D insufficiency -A she is currently not taking any vitamin D do to her renal insufficiency. - CBC with Differential/Platelet - Vit D  25 hydroxy (rtn osteoporosis monitoring)  8. Malnutrition of moderate degree ([place]) -We will look at the lab work today and hopefully this has improved since her hospitalization and nursing home stay. - CBC with Differential/Platelet  9. Type 2 diabetes mellitus with complication, with long-term current use of insulin (HCC) -Check A1c and make further treatment recommendations after that. -Old blood sugars brought in by the patient today were elevated significantly. She is always been very difficult to control and manage. - POCT glycosylated hemoglobin (Hb A1C) - BMP8+EGFR - CBC with Differential/Platelet  Patient Instructions   We will order a home health nurse to come [place] visit with the patient and arrange for any services that can be provided to help assist the patient and her husband at home because of her needs and the history of her CVA diabetes etc.  The patient should continue to be careful and did not put herself at risk for falling Continue good hydration Use nasal saline and nasal gel to keep nasal passages moist Use Mucinex as needed for cough and congestion Have her practice good deep inhalations to keep her lungs open Keep follow-up appointments with neurology and nephrology We will do our best to coordinate care with a home health nurse and with our diabetic educator to help this patient maintained good health care at home with her husband supervising this   Arrie Senate MD

## 2015-08-22 NOTE — Patient Instructions (Addendum)
We will order a home health nurse to come [place] visit with the patient and arrange for any services that can be provided to help assist the patient and her husband at home because of her needs and the history of her CVA diabetes etc.  The patient should continue to be careful and did not put herself at risk for falling Continue good hydration Use nasal saline and nasal gel to keep nasal passages moist Use Mucinex as needed for cough and congestion Have her practice good deep inhalations to keep her lungs open Keep follow-up appointments with neurology and nephrology We will do our best to coordinate care with a home health nurse and with our diabetic educator to help this patient maintained good health care at home with her husband supervising this

## 2015-08-23 ENCOUNTER — Encounter: Payer: Self-pay | Admitting: Family Medicine

## 2015-08-23 LAB — VITAMIN D 25 HYDROXY (VIT D DEFICIENCY, FRACTURES): VIT D 25 HYDROXY: 16.7 ng/mL — AB (ref 30.0–100.0)

## 2015-08-23 LAB — CBC WITH DIFFERENTIAL/PLATELET
BASOS ABS: 0 10*3/uL (ref 0.0–0.2)
Basos: 0 %
EOS (ABSOLUTE): 0.2 10*3/uL (ref 0.0–0.4)
Eos: 2 %
Hematocrit: 35.8 % (ref 34.0–46.6)
Hemoglobin: 12.1 g/dL (ref 11.1–15.9)
Immature Grans (Abs): 0 10*3/uL (ref 0.0–0.1)
Immature Granulocytes: 1 %
LYMPHS ABS: 1.2 10*3/uL (ref 0.7–3.1)
Lymphs: 19 %
MCH: 31.9 pg (ref 26.6–33.0)
MCHC: 33.8 g/dL (ref 31.5–35.7)
MCV: 95 fL (ref 79–97)
MONOS ABS: 0.7 10*3/uL (ref 0.1–0.9)
Monocytes: 11 %
NEUTROS ABS: 4.1 10*3/uL (ref 1.4–7.0)
Neutrophils: 67 %
PLATELETS: 230 10*3/uL (ref 150–379)
RBC: 3.79 x10E6/uL (ref 3.77–5.28)
RDW: 14.8 % (ref 12.3–15.4)
WBC: 6.2 10*3/uL (ref 3.4–10.8)

## 2015-08-23 LAB — BMP8+EGFR
BUN / CREAT RATIO: 10 — AB (ref 11–26)
BUN: 29 mg/dL — ABNORMAL HIGH (ref 8–27)
CALCIUM: 8.9 mg/dL (ref 8.7–10.3)
CHLORIDE: 98 mmol/L (ref 97–106)
CO2: 15 mmol/L — ABNORMAL LOW (ref 18–29)
Creatinine, Ser: 2.99 mg/dL — ABNORMAL HIGH (ref 0.57–1.00)
GFR calc non Af Amer: 14 mL/min/{1.73_m2} — ABNORMAL LOW (ref >59)
GFR, EST AFRICAN AMERICAN: 17 mL/min/{1.73_m2} — AB (ref >59)
Glucose: 270 mg/dL — ABNORMAL HIGH (ref 65–99)
POTASSIUM: 5.3 mmol/L — AB (ref 3.5–5.2)
SODIUM: 132 mmol/L — AB (ref 136–144)

## 2015-08-23 LAB — HEPATIC FUNCTION PANEL
ALT: 35 IU/L — AB (ref 0–32)
AST: 45 IU/L — ABNORMAL HIGH (ref 0–40)
Albumin: 3.6 g/dL (ref 3.5–4.8)
Alkaline Phosphatase: 226 IU/L — ABNORMAL HIGH (ref 39–117)
Bilirubin Total: <0.2 mg/dL (ref 0.0–1.2)
Bilirubin, Direct: 0.08 mg/dL (ref 0.00–0.40)
TOTAL PROTEIN: 6.2 g/dL (ref 6.0–8.5)

## 2015-08-25 ENCOUNTER — Other Ambulatory Visit: Payer: Self-pay

## 2015-08-25 ENCOUNTER — Emergency Department (HOSPITAL_COMMUNITY)
Admission: EM | Admit: 2015-08-25 | Discharge: 2015-08-25 | Disposition: A | Discharge status: Home / Self Care | Payer: Medicare Other | Attending: Emergency Medicine | Admitting: Emergency Medicine

## 2015-08-25 ENCOUNTER — Encounter (HOSPITAL_COMMUNITY): Payer: Self-pay

## 2015-08-25 ENCOUNTER — Emergency Department (HOSPITAL_COMMUNITY): Payer: Medicare Other

## 2015-08-25 DIAGNOSIS — E119 Type 2 diabetes mellitus without complications: Secondary | ICD-10-CM | POA: Insufficient documentation

## 2015-08-25 DIAGNOSIS — Z794 Long term (current) use of insulin: Secondary | ICD-10-CM | POA: Diagnosis not present

## 2015-08-25 DIAGNOSIS — Z8673 Personal history of transient ischemic attack (TIA), and cerebral infarction without residual deficits: Secondary | ICD-10-CM | POA: Insufficient documentation

## 2015-08-25 DIAGNOSIS — Z79899 Other long term (current) drug therapy: Secondary | ICD-10-CM | POA: Insufficient documentation

## 2015-08-25 DIAGNOSIS — Z862 Personal history of diseases of the blood and blood-forming organs and certain disorders involving the immune mechanism: Secondary | ICD-10-CM | POA: Insufficient documentation

## 2015-08-25 DIAGNOSIS — N183 Chronic kidney disease, stage 3 unspecified: Secondary | ICD-10-CM

## 2015-08-25 DIAGNOSIS — R110 Nausea: Secondary | ICD-10-CM | POA: Diagnosis present

## 2015-08-25 DIAGNOSIS — R001 Bradycardia, unspecified: Secondary | ICD-10-CM | POA: Insufficient documentation

## 2015-08-25 DIAGNOSIS — M1990 Unspecified osteoarthritis, unspecified site: Secondary | ICD-10-CM | POA: Insufficient documentation

## 2015-08-25 DIAGNOSIS — Z880 Allergy status to penicillin: Secondary | ICD-10-CM | POA: Diagnosis not present

## 2015-08-25 DIAGNOSIS — E785 Hyperlipidemia, unspecified: Secondary | ICD-10-CM | POA: Insufficient documentation

## 2015-08-25 DIAGNOSIS — I129 Hypertensive chronic kidney disease with stage 1 through stage 4 chronic kidney disease, or unspecified chronic kidney disease: Secondary | ICD-10-CM | POA: Insufficient documentation

## 2015-08-25 DIAGNOSIS — Z8614 Personal history of Methicillin resistant Staphylococcus aureus infection: Secondary | ICD-10-CM | POA: Insufficient documentation

## 2015-08-25 LAB — BASIC METABOLIC PANEL
Anion gap: 8 (ref 5–15)
BUN: 26 mg/dL — AB (ref 6–20)
CO2: 18 mmol/L — ABNORMAL LOW (ref 22–32)
CREATININE: 2.7 mg/dL — AB (ref 0.44–1.00)
Calcium: 9.4 mg/dL (ref 8.9–10.3)
Chloride: 107 mmol/L (ref 101–111)
GFR calc Af Amer: 18 mL/min — ABNORMAL LOW (ref >60)
GFR, EST NON AFRICAN AMERICAN: 16 mL/min — AB (ref >60)
GLUCOSE: 208 mg/dL — AB (ref 65–99)
Potassium: 4.7 mmol/L (ref 3.5–5.1)
SODIUM: 133 mmol/L — AB (ref 135–145)

## 2015-08-25 LAB — CBC WITH DIFFERENTIAL/PLATELET
BASOS ABS: 0 10*3/uL (ref 0.0–0.1)
Basophils Relative: 1 %
EOS ABS: 0.2 10*3/uL (ref 0.0–0.7)
EOS PCT: 3 %
HCT: 32.6 % — ABNORMAL LOW (ref 36.0–46.0)
Hemoglobin: 11.1 g/dL — ABNORMAL LOW (ref 12.0–15.0)
LYMPHS ABS: 1.2 10*3/uL (ref 0.7–4.0)
LYMPHS PCT: 23 %
MCH: 31.2 pg (ref 26.0–34.0)
MCHC: 34 g/dL (ref 30.0–36.0)
MCV: 91.6 fL (ref 78.0–100.0)
MONO ABS: 0.5 10*3/uL (ref 0.1–1.0)
Monocytes Relative: 10 %
Neutro Abs: 3.3 10*3/uL (ref 1.7–7.7)
Neutrophils Relative %: 63 %
PLATELETS: 190 10*3/uL (ref 150–400)
RBC: 3.56 MIL/uL — AB (ref 3.87–5.11)
RDW: 13.7 % (ref 11.5–15.5)
WBC: 5.3 10*3/uL (ref 4.0–10.5)

## 2015-08-25 LAB — URINALYSIS, ROUTINE W REFLEX MICROSCOPIC
BILIRUBIN URINE: NEGATIVE
GLUCOSE, UA: NEGATIVE mg/dL
Hgb urine dipstick: NEGATIVE
KETONES UR: NEGATIVE mg/dL
Nitrite: NEGATIVE
PH: 5 (ref 5.0–8.0)
Protein, ur: 100 mg/dL — AB
Specific Gravity, Urine: 1.012 (ref 1.005–1.030)
Urobilinogen, UA: 0.2 mg/dL (ref 0.0–1.0)

## 2015-08-25 LAB — URINE MICROSCOPIC-ADD ON

## 2015-08-25 LAB — I-STAT TROPONIN, ED: Troponin i, poc: 0.06 ng/mL (ref 0.00–0.08)

## 2015-08-25 NOTE — Discharge Instructions (Signed)
Chronic Kidney Disease Chronic kidney disease occurs when the kidneys are damaged over a long period. The kidneys are two organs that lie on either side of the spine between the middle of the back and the front of the abdomen. The kidneys:  Remove wastes and extra water from the blood.  Produce important hormones. These help keep bones strong, regulate blood pressure, and help create red blood cells.  Balance the fluids and chemicals in the blood and tissues. A small amount of kidney damage may not cause problems, but a large amount of damage may make it difficult or impossible for the kidneys to work the way they should. If steps are not taken to slow down the kidney damage or stop it from getting worse, the kidneys may stop working permanently. Most of the time, chronic kidney disease does not go away. However, it can often be controlled, and those with the disease can usually live normal lives. CAUSES The most common causes of chronic kidney disease are diabetes and high blood pressure (hypertension). Chronic kidney disease may also be caused by:  Diseases that cause the kidneys' filters to become inflamed.  Diseases that affect the immune system.  Genetic diseases.  Medicines that damage the kidneys, such as anti-inflammatory medicines.  Poisoning or exposure to toxic substances.  A reoccurring kidney or urinary infection.  A problem with urine flow. This may be caused by:  Cancer.  Kidney stones.  An enlarged prostate in males. SIGNS AND SYMPTOMS Because the kidney damage in chronic kidney disease occurs slowly, symptoms develop slowly and may not be obvious until the kidney damage becomes severe. A person may have a kidney disease for years without showing any symptoms. Symptoms can include:  Swelling (edema) of the legs, ankles, or feet.  Tiredness (lethargy).  Nausea or vomiting.  Confusion.  Problems with urination, such as:  Decreased urine  production.  Frequent urination, especially at night.  Frequent accidents in children who are potty trained.  Muscle twitches and cramps.  Shortness of breath.  Weakness.  Persistent itchiness.  Loss of appetite.  Metallic taste in the mouth.  Trouble sleeping.  Slowed development in children.  Short stature in children. DIAGNOSIS Chronic kidney disease may be detected and diagnosed by tests, including blood, urine, imaging, or kidney biopsy tests. TREATMENT Most chronic kidney diseases cannot be cured. Treatment usually involves relieving symptoms and preventing or slowing the progression of the disease. Treatment may include:  A special diet. You may need to avoid alcohol and foods thatare salty and high in potassium.  Medicines. These may:  Lower blood pressure.  Relieve anemia.  Relieve swelling.  Protect the bones. HOME CARE INSTRUCTIONS  Follow your prescribed diet. Your health care provider may instruct you to limit daily salt (sodium) and protein intake.  Take medicines only as directed by your health care provider. Do not take any new medicines (prescription, over-the-counter, or nutritional supplements) unless approved by your health care provider. Many medicines can worsen your kidney damage or need to have the dose adjusted.   Quit smoking if you smoke. Talk to your health care provider about a smoking cessation program.  Keep all follow-up visits as directed by your health care provider.  Monitor your blood pressure.  Start or continue an exercise plan.  Get immunizations as directed by your health care provider.  Take vitamin and mineral supplements as directed by your health care provider. SEEK IMMEDIATE MEDICAL CARE IF:  Your symptoms get worse or you develop  new symptoms.  You develop symptoms of end-stage kidney disease. These include:  Headaches.  Abnormally dark or light skin.  Numbness in the hands or feet.  Easy  bruising.  Frequent hiccups.  Menstruation stops.  You have a fever.  You have decreased urine production.  You havepain or bleeding when urinating. MAKE SURE YOU:  Understand these instructions.  Will watch your condition.  Will get help right away if you are not doing well or get worse. FOR MORE INFORMATION   American Association of Kidney Patients: BombTimer.gl  National Kidney Foundation: www.kidney.[place]: https://mathis.com/  [facility]: www.lifeoptions.org and www.kidneyschool.org   This information is not intended to replace advice given to you by your health care provider. Make sure you discuss any questions you have with your health care provider.   Document Released: 07/27/2008 Document Revised: 11/08/2014 Document Reviewed: 06/16/2012 Elsevier Interactive Patient Education 2016 Elsevier Inc.  Nausea and Vomiting Nausea is a sick feeling that often comes before throwing up (vomiting). Vomiting is a reflex where stomach contents come out of your mouth. Vomiting can cause severe loss of body fluids (dehydration). Children and elderly adults can become dehydrated quickly, especially if they also have diarrhea. Nausea and vomiting are symptoms of a condition or disease. It is important to find the cause of your symptoms. CAUSES   Direct irritation of the stomach lining. This irritation can result from increased acid production (gastroesophageal reflux disease), infection, food poisoning, taking certain medicines (such as nonsteroidal anti-inflammatory drugs), alcohol use, or tobacco use.  Signals from the brain.These signals could be caused by a headache, heat exposure, an inner ear disturbance, increased pressure in the brain from injury, infection, a tumor, or a concussion, pain, emotional stimulus, or metabolic problems.  An obstruction in the gastrointestinal tract (bowel obstruction).  Illnesses such as diabetes,  hepatitis, gallbladder problems, appendicitis, kidney problems, cancer, sepsis, atypical symptoms of a heart attack, or eating disorders.  Medical treatments such as chemotherapy and radiation.  Receiving medicine that makes you sleep (general anesthetic) during surgery. DIAGNOSIS Your caregiver may ask for tests to be done if the problems do not improve after a few days. Tests may also be done if symptoms are severe or if the reason for the nausea and vomiting is not clear. Tests may include:  Urine tests.  Blood tests.  Stool tests.  Cultures (to look for evidence of infection).  X-rays or other imaging studies. Test results can help your caregiver make decisions about treatment or the need for additional tests. TREATMENT You need to stay well hydrated. Drink frequently but in small amounts.You may wish to drink water, sports drinks, clear broth, or eat frozen ice pops or gelatin dessert to help stay hydrated.When you eat, eating slowly may help prevent nausea.There are also some antinausea medicines that may help prevent nausea. HOME CARE INSTRUCTIONS   Take all medicine as directed by your caregiver.  If you do not have an appetite, do not force yourself to eat. However, you must continue to drink fluids.  If you have an appetite, eat a normal diet unless your caregiver tells you differently.  Eat a variety of complex carbohydrates (rice, wheat, potatoes, bread), lean meats, yogurt, fruits, and vegetables.  Avoid high-fat foods because they are more difficult to digest.  Drink enough water and fluids to keep your urine clear or pale yellow.  If you are dehydrated, ask your caregiver for specific rehydration instructions. Signs of dehydration may include:  Severe thirst.  Dry lips and mouth.  Dizziness.  Dark urine.  Decreasing urine frequency and amount.  Confusion.  Rapid breathing or pulse. SEEK IMMEDIATE MEDICAL CARE IF:   You have blood or brown flecks  (like coffee grounds) in your vomit.  You have black or bloody stools.  You have a severe headache or stiff neck.  You are confused.  You have severe abdominal pain.  You have chest pain or trouble breathing.  You do not urinate at least once every 8 hours.  You develop cold or clammy skin.  You continue to vomit for longer than 24 to 48 hours.  You have a fever. MAKE SURE YOU:   Understand these instructions.  Will watch your condition.  Will get help right away if you are not doing well or get worse.   This information is not intended to replace advice given to you by your health care provider. Make sure you discuss any questions you have with your health care provider.   Document Released: 10/18/2005 Document Revised: 01/10/2012 Document Reviewed: 03/17/2011 Elsevier Interactive Patient Education 2016 Elsevier Inc. Pain Without a Known Cause WHAT IS PAIN WITHOUT A KNOWN CAUSE? Pain can occur in any part of the body and can range from mild to severe. Sometimes no cause can be found for why you are having pain. Some types of pain that can occur without a known cause include:   Headache.  Back pain.  Abdominal pain.  Neck pain. HOW IS PAIN WITHOUT A KNOWN CAUSE DIAGNOSED?  Your health care provider will try to find the cause of your pain. This may include:  Physical exam.  Medical history.  Blood tests.  Urine tests.  X-rays. If no cause is found, your health care provider may diagnose you with pain without a known cause.  IS THERE TREATMENT FOR PAIN WITHOUT A CAUSE?  Treatment depends on the kind of pain you have. Your health care provider may prescribe medicines to help relieve your pain.  WHAT CAN I DO AT HOME FOR MY PAIN?   Take medicines only as directed by your health care provider.  Stop any activities that cause pain. During periods of severe pain, bed rest may help.  Try to reduce your stress with activities such as yoga or meditation. Talk to  your health care provider for other stress-reducing activity recommendations.  Exercise regularly, if approved by your health care provider.  Eat a healthy diet that includes fruits and vegetables. This may improve pain. Talk to your health care provider if you have any questions about your diet. WHAT IF MY PAIN DOES NOT GET BETTER?  If you have a painful condition and no reason can be found for the pain or the pain gets worse, it is important to follow up with your health care provider. It may be necessary to repeat tests and look further for a possible cause.    This information is not intended to replace advice given to you by your health care provider. Make sure you discuss any questions you have with your health care provider.   Document Released: 07/13/2001 Document Revised: 11/08/2014 Document Reviewed: 03/05/2014 Elsevier Interactive Patient Education [employer].

## 2015-08-25 NOTE — ED Notes (Signed)
Pt placed into gown and on monitor.Pt remains monitored by blood pressure, pulse ox, and 5 lead. pts family remains at bedside.

## 2015-08-25 NOTE — ED Provider Notes (Signed)
CSN: [id number]     Arrival date & time 08/25/15  1247 History   First MD Initiated Contact with Patient 08/25/15 1321     Chief Complaint  Patient presents with  . Generalized Body Aches  . Nausea     The history is provided by the patient and the spouse. No language interpreter was used.   [name] presents for evaluation of body aches and nausea.  History is provided by the patient and her husband.  Hx is limited due to the patient being a very vague historian.  She presents for evaluation of generalized body aches and nausea that have been ongoing "for a while" - weeks or more.  She has CKD stage IV and is scheduled to have a renal biopsy on Wednesday.  She denies any fevers, chest pain, sob, dysuria, diarrhea, vomiting.  Her nausea is controlled with zofran four times daily.  Sxs are mild to moderate, constant, worsening.    Past Medical History  Diagnosis Date  . Diverticulitis 2005    Colonoscopy--Dr. Sharlett Iles   . Diabetes mellitus without complication ([place])   . Hyperlipidemia   . Hypertension   . Stroke [facility])     Past history  . Hepatic steatosis   . Anemia   . Arthritis   . Chronic kidney disease (CKD)     past dialysis  . Status post dilation of esophageal narrowing   . MRSA infection 07/2009  . Proteinuria 09/23/2014  . MGUS (monoclonal gammopathy of unknown significance) 10/10/2014  . Hyperplastic colon polyp   . Fundic gland polyps of stomach, benign   . GERD (gastroesophageal reflux disease)   . Gastroparesis   . CVA (cerebral vascular accident) [facility])    Past Surgical History  Procedure Laterality Date  . Bilateral oophorectomy    . Rotator cuff repair Bilateral   . Carpal tunnel repair Bilateral   . Laparoscopic colostomy      for diverticulitis  . Nasal sinus surgery    . Tonsillectomy    . Replacement total knee Right   . Abdominal hernia repair      with ilestomy  . Partial colectomy    . Esophagogastroduodenoscopy N/A 07/16/2015    Procedure:  ESOPHAGOGASTRODUODENOSCOPY (EGD);  Surgeon: Manus Gunning, MD;  Location: [facility];  Service: Gastroenterology;  Laterality: N/A;   Family History  Problem Relation Age of Onset  . Diabetes Mother   . Heart disease Father 67    Died after bypass  . Diabetes Sister   . Colon cancer Neg Hx    Social History  Substance Use Topics  . Smoking status: Never Smoker   . Smokeless tobacco: Never Used  . Alcohol Use: No   OB History    No data available     Review of Systems  All other systems reviewed and are negative.     Allergies  Ace inhibitors; Actos; Aspirin; Codeine; Hydromorphone hcl; Metformin and related; Penicillins; Procaine hcl; Statins; and Sulfa antibiotics  Home Medications   Prior to Admission medications   Medication Sig Start Date End Date Taking? Authorizing Provider  acetaminophen (TYLENOL) 500 MG tablet Take 1,000 mg by mouth every 6 (six) hours as needed for moderate pain.    Yes Historical Provider, MD  amLODipine (NORVASC) 5 MG tablet Take 5 mg by mouth daily.   Yes Historical Provider, MD  B Complex-C-Folic Acid (MULTIVITAMIN, STRESS FORMULA) tablet Take 1 tablet by mouth daily.     Yes Historical Provider, MD  clopidogrel (PLAVIX) 75 MG tablet Take 1 tablet (75 mg total) by mouth daily. 08/19/15  Yes Chipper Herb, MD  feeding supplement, ENSURE ENLIVE, (ENSURE ENLIVE) LIQD Take 237 mLs by mouth 2 (two) times daily after a meal. 07/21/15  Yes Geradine Girt, DO  Insulin Glargine (LANTUS SOLOSTAR) 100 UNIT/ML Solostar Pen Inject 26 Units into the skin every evening. 08/18/15  Yes Tammy Eckard, PHARMD  insulin lispro (HUMALOG KWIKPEN) 100 UNIT/ML KiwkPen Inject 0.05-0.1 mLs (5-10 Units total) into the skin 3 (three) times daily. 08/18/15  Yes Tammy Eckard, PHARMD  Insulin Pen Needle (PEN NEEDLES 31GX5/16") 31G X 8 MM MISC USE AS DIRECTED TWICE A DAY Patient taking differently: 1 each by Other route 3 (three) times daily. USE AS DIRECTED TWICE A  DAY 05/27/15  Yes Chipper Herb, MD  Lancets [facility] ULTRASOFT) lancets CHECK BLOOD SUGAR 4 TIMES A DAY 04/07/15  Yes Chipper Herb, MD  levothyroxine (SYNTHROID, LEVOTHROID) 50 MCG tablet TAKE 1 TABLET DAILY 09/19/14  Yes Chipper Herb, MD  metoprolol (LOPRESSOR) 50 MG tablet Take 50 mg by mouth 2 (two) times daily.   Yes Historical Provider, MD  omega-3 acid ethyl esters (LOVAZA) 1 G capsule TAKE (2) CAPSULES TWICE DAILY. 09/06/14  Yes Chipper Herb, MD  ondansetron (ZOFRAN) 4 MG tablet Take 4 mg by mouth every 4 (four) hours as needed for nausea.    Yes Historical Provider, MD  ONE TOUCH ULTRA TEST test strip Test blood sugar qid prn. DX E11.8 07/11/15  Yes Chipper Herb, MD  pantoprazole (PROTONIX) 40 MG tablet TAKE (1) TABLET TWICE A DAY. 04/29/15  Yes Chipper Herb, MD  Polyethyl Glycol-Propyl Glycol 0.4-0.3 % SOLN Apply 1 drop to eye 3 (three) times daily as needed (dry eyes).   Yes Historical Provider, MD  pravastatin (PRAVACHOL) 20 MG tablet Take 1 tablet (20 mg total) by mouth daily at 6 PM. 08/19/15  Yes Chipper Herb, MD  pyridOXINE (B-6) 50 MG tablet Take 1 tablet (50 mg total) by mouth daily. 07/21/15  Yes Geradine Girt, DO  traMADol (ULTRAM) 50 MG tablet Take 1 tablet (50 mg total) by mouth every 6 (six) hours as needed. 11/06/13  Yes Sable Feil, MD  glucagon (GLUCAGON EMERGENCY) 1 MG injection Inject into the muscle as needed for severe low BG 06/16/15   Tammy Eckard, PHARMD   BP 169/47 mmHg  Pulse 52  Temp(Src) 97.8 F (36.6 C) (Oral)  Resp 20  SpO2 98% Physical Exam  Constitutional: She appears well-developed and well-nourished.  HENT:  Head: Normocephalic and atraumatic.  Cardiovascular: Regular rhythm.   No murmur heard. bradycardic  Pulmonary/Chest: Effort normal and breath sounds normal. No respiratory distress.  Abdominal: Soft. There is no tenderness. There is no rebound and no guarding.  Ileostomy in LLQ  Musculoskeletal: She exhibits no edema or  tenderness.  Fistula in RUE   Neurological: She is alert.  Mildly confused, MAE  Skin: Skin is warm and dry.  Psychiatric: She has a normal mood and affect. Her behavior is normal.  Nursing note and vitals reviewed.   ED Course  Procedures (including critical care time) Labs Review Labs Reviewed  BASIC METABOLIC PANEL - Abnormal; Notable for the following:    Sodium 133 (*)    CO2 18 (*)    Glucose, Bld 208 (*)    BUN 26 (*)    Creatinine, Ser 2.70 (*)    GFR calc non Af Amer 16 (*)  GFR calc Af Amer 18 (*)    All other components within normal limits  CBC WITH DIFFERENTIAL/PLATELET - Abnormal; Notable for the following:    RBC 3.56 (*)    Hemoglobin 11.1 (*)    HCT 32.6 (*)    All other components within normal limits  URINALYSIS, ROUTINE W REFLEX MICROSCOPIC (NOT AT [facility])  I-STAT TROPOININ, ED    Imaging Review No results found. I have personally reviewed and evaluated these images and lab results as part of my medical decision-making.  ED ECG REPORT   Date: 08/25/2015  Rate: 55  Rhythm: sinus bradycardia  QRS Axis: normal  Intervals: normal  ST/T Wave abnormalities: nonspecific ST changes  Conduction Disutrbances:none  Narrative Interpretation:   Old EKG Reviewed: unchanged  I have personally reviewed the EKG tracing and agree with the computerized printout as noted.   MDM   Final diagnoses:  Nausea  CKD (chronic kidney disease) stage 3, GFR 30-59 ml/min    Pt with hx/o stage IV kidney disease here with progressive nausea and body aches.  BMP stable compared to priors.  Pt euvolemic on exam.  CXR, UA pending to r/o pna and UTI.  Plan to d/c home with close nephrology follow up as scheduled.      Quintella Reichert, MD 08/26/15 [phone]

## 2015-08-25 NOTE — ED Notes (Signed)
[facility]- Pt coming from home with c/o generalized body aches and nausea. Pt has hx of renal failure with graft placement in 2008, kidneys began working again in 2009 but are now failing again. Pt reports she is still making urine. Hypertensive with EMS which pt reports is normal.

## 2015-08-26 ENCOUNTER — Encounter: Payer: Self-pay | Admitting: Family Medicine

## 2015-08-28 ENCOUNTER — Telehealth: Payer: Self-pay | Admitting: Family Medicine

## 2015-08-28 MED ORDER — INSULIN GLARGINE 100 UNIT/ML SOLOSTAR PEN: 30.0000 [IU] | PEN_INJECTOR | Freq: Every evening | SUBCUTANEOUS | Status: DC

## 2015-08-28 NOTE — Telephone Encounter (Signed)
Patients BG is in the 350 to 400's Currently taking Lantus 26 units QD and Humalog 5 units tid after meals ( per patient these are directions from nursing home) Patient is still using Lantus from nursing home which has a note on it to discontinue use after 08/18/2015 Recommend the following changes:  Increase Lantus to 30 units daily and increase Humalog 6 units tid BEFORE meals. Also recommend stop using Lantus from nursing home and begin to use Lantus pens he received from pharmacy.  Patient's husband notified.

## 2015-08-30 ENCOUNTER — Other Ambulatory Visit: Payer: Self-pay | Admitting: Internal Medicine

## 2015-08-30 NOTE — Telephone Encounter (Signed)
elevated blood sugar as high as 400- talked with T. Eckard and increased humalog 6u with each meal and lantus increased to 30u- Told husband to give her 10u of humalog and recheck blood sugar in 15 minutes and call back.

## 2015-09-01 ENCOUNTER — Telehealth: Payer: Self-pay | Admitting: Pharmacist

## 2015-09-01 NOTE — Telephone Encounter (Signed)
BG reading have decreased to 200's and 300's.  Still elevated but improved.  Recommend increase Lantus to 34 units once daily and continue Humalog 8 units prior to each meal.

## 2015-09-02 ENCOUNTER — Telehealth: Payer: Self-pay | Admitting: Family Medicine

## 2015-09-02 NOTE — Telephone Encounter (Signed)
FYI

## 2015-09-03 ENCOUNTER — Encounter: Payer: Self-pay | Admitting: Neurology

## 2015-09-03 ENCOUNTER — Ambulatory Visit (INDEPENDENT_AMBULATORY_CARE_PROVIDER_SITE_OTHER): Payer: Medicare Other | Admitting: Neurology

## 2015-09-03 VITALS — BP 150/66 | HR 54 | Ht 61.0 in | Wt 170.6 lb

## 2015-09-03 DIAGNOSIS — R1110 Vomiting, unspecified: Secondary | ICD-10-CM | POA: Insufficient documentation

## 2015-09-03 DIAGNOSIS — E1159 Type 2 diabetes mellitus with other circulatory complications: Secondary | ICD-10-CM

## 2015-09-03 DIAGNOSIS — I10 Essential (primary) hypertension: Secondary | ICD-10-CM | POA: Diagnosis not present

## 2015-09-03 DIAGNOSIS — I63312 Cerebral infarction due to thrombosis of left middle cerebral artery: Secondary | ICD-10-CM | POA: Diagnosis not present

## 2015-09-03 DIAGNOSIS — E785 Hyperlipidemia, unspecified: Secondary | ICD-10-CM | POA: Diagnosis not present

## 2015-09-03 NOTE — Patient Instructions (Signed)
-   continue plavix and pravastatin for stroke prevention - check BP and glucose at home - avoid low BP too - Follow up with your primary care physician for stroke risk factor modification. Recommend maintain blood pressure goal <130/80, diabetes with hemoglobin A1c goal below 6.5% and lipids with LDL cholesterol goal below 70 mg/dL.  - follow up in 3 months

## 2015-09-03 NOTE — Progress Notes (Signed)
STROKE NEUROLOGY FOLLOW UP NOTE  NAME: [name] DOB: 1936/04/08  REASON FOR VISIT: stroke follow up HISTORY FROM: husband and chart  Today we had the pleasure of seeing [name] in follow-up at our Neurology Clinic. Pt was accompanied by husband.   History Summary Ms. [name] is a 79 y.o. female with history of diabetes mellitus, hyperlipidemia, hypertension, previous stroke, chronic kidney disease, gastroparesis was admitted for intractable N/V. During the hospitalization, she developed confusion with mil word finding difficulties, and decreased level of responsiveness.MRI show multifocal acute left frontal MCA/ACA border zone punctate infarcts. Etiology could be due to atherosclerosis due to multiple uncontrolled stroke risk factors vs. Hypotensive events due to N/V, dehydration with watershed infarcts. MRA, CUS, TTE, DVT screening, MRV all unremarkable. However, her LDL 246 and A1C 6.9. She was put on plavix and pravastatin and recommend aggressive risk factor control.   Interval History During the interval time, the patient has been doing better. Repeat LDL was 74. However, as per husband, her risk factor still not in good control. BP running 170s at home some time, and glucose ranging from 250 to 450. Her humlog and lantus dose have been increased. Today her BP 150/66. However, pt today in clinic feeling N/V, lethargic, and restless, and requested to finish off the clinic in a hurry.     REVIEW OF SYSTEMS: Full 14 system review of systems performed and notable only for those listed below and in HPI above, all others are negative:  Constitutional:  Weight loss, fatigue Cardiovascular:  Ear/Nose/Throat:   Skin:  Eyes:   Respiratory:  cough Gastroitestinal:   Genitourinary:  Hematology/Lymphatic:   Endocrine: feeling cold and hot Musculoskeletal:   Allergy/Immunology:   Neurological:  Memory loss, confusion, HA, weakness Psychiatric: anxiety, decreased  energy, change in appetite, disinterest in activities Sleep:   The following represents the patient's updated allergies and side effects list: Allergies  Allergen Reactions  . Ace Inhibitors Other (See Comments)    unknown  . Actos [Pioglitazone Hydrochloride] Other (See Comments)    Jitters   . Aspirin     REACTION: GI upset  . Codeine     REACTION: hallucinations  . Hydromorphone Hcl Nausea And Vomiting  . Metformin And Related Nausea And Vomiting  . Penicillins     REACTION: "crazy feeling"  . Procaine Hcl Other (See Comments)    Pt goes crazy   . Statins Other (See Comments)    myalgias  . Sulfa Antibiotics     The neurologically relevant items on the patient's problem list were reviewed on today's visit.  Neurologic Examination  A problem focused neurological exam (12 or more points of the single system neurologic examination, vital signs counts as 1 point, cranial nerves count for 8 points) was performed.  Blood pressure 150/66, pulse 54, height 5\' 1"  (1.549 m), weight 170 lb 9.6 oz (77.384 kg).  Exam limited due to acute distress of N/V and agitation with restless.   General - Well nourished, well developed, acute distress of N/V and restless.  Ophthalmologic - Fundi not visualized due to noncooperation.  Cardiovascular - Regular rate and rhythm.  Mental Status -  Level of arousal and orientation to place, and person were intact, not answer questions for time. Paucity of speech, but following simple commands.  Cranial Nerves II - XII - II - blinking to visual threat bilaterally. III, IV, VI - Extraocular movements intact. V - Facial sensation intact bilaterally. VII - Facial  symmetric. VIII - Hearing & vestibular intact bilaterally. X - not cooperative. XI - not cooperative. XII - Tongue protrusion intact.  Motor Strength - The patient's strength was 4/5 in all extremities and pronator drift was absent.  Bulk was normal and fasciculations were absent.     Motor Tone - Muscle tone was assessed at the neck and appendages and was normal.  Reflexes - The patient's reflexes were 1+ in all extremities and she had no pathological reflexes.  Sensory - Light touch, temperature/pinprick were assessed and were normal.    Coordination - not cooperative on exam.  Tremor was absent.  Gait and Station - not tested as pt was put into wheelchair rolling out of clinic.  Data reviewed: I personally reviewed the images and agree with the radiology interpretations.  Ct Head Wo Contrast 07/16/2015  Chronic changes in the brain but no acute findings.   Mr Brain Wo Contrast 07/17/2015  Multifocal areas of non hemorrhagic acute infarction affect the LEFT frontal subcortical white matter. Advanced chronic microvascular ischemic change with generalized moderate atrophy. Areas of chronic ischemia as described, most notable being the LEFT thalamic hemorrhagic lacunar infarct.   LE venous Doppler - negative for DVT  Carotid Doppler - Bilateral: 1-39% ICA stenosis. Vertebral artery flow is antegrade.  2-D echo - - Left ventricle: The cavity size was mildly dilated. There was moderate concentric hypertrophy. Systolic function was normal. The estimated ejection fraction was in the range of 55% to 60%. Wall motion was normal; there were no regional wall motion abnormalities. - Aortic valve: There was trivial regurgitation. - Left atrium: The atrium was mildly dilated. - Atrial septum: No defect or patent foramen ovale was identified.  MRA   Motion degraded study. No major vessel occlusion. Cannot rule out intracranial branch vessel stenotic disease.  There appears to be diminished flow in the left vertebral artery when compared to the study of 2010. This could be partially artifactual as well however.  Likely artifactual appearance of stenosis of the distal basilar. In evaluating the source images, I think it is less likely that  there is a true distal basilar stenosis.  MRV  No evidence of venous thrombosis. Very diminutive left transverse sinus as demonstrated on previous studies. I do not believe there is any venous thrombosis.  Component     Latest Ref Rng 06/10/2015 07/14/2015 07/15/2015 07/19/2015  Cholesterol, Total     100 - 199 mg/dL 380 (H)     Triglycerides     <150 mg/dL 384 (H)   202 (H)  HDL Cholesterol     >40 mg/dL 57   55  VLDL Cholesterol Cal     5 - 40 mg/dL 77 (H)     LDL (calc)     0 - 99 mg/dL 246 (H)   74  Total CHOL/HDL Ratio      6.7 (H)   3.1  Cholesterol     0 - 200 mg/dL    169  VLDL     0 - 40 mg/dL    40  Hemoglobin A1C     4.8 - 5.6 %  6.9 (H)    Mean Plasma Glucose       151    TSH     0.350 - 4.500 uIU/mL   1.706     Assessment: As you may recall, she is a 79 y.o. Caucasian female with PMH of DM, HLD, HTN, CKD, diabetic gastroparesis, previous stroke was admitted on 07/14/15 for  intractable N/V. During the hospitalization, she developed confusion with mil word finding difficulties, and decreased level of responsiveness.MRI show multifocal acute left frontal MCA/ACA border zone punctate infarcts. Etiology could be due to atherosclerosis with multiple uncontrolled stroke risk factors vs. Hypotensive events due to N/V, dehydration with watershed infarcts. MRA, CUS, TTE, DVT screening, MRV all unremarkable. However, her LDL 246 and A1C 6.9. She was put on plavix and pravastatin and recommend aggressive risk factor control. However, during the interval time, her BP and glucose still not in good control. HDL better and repeat LDL was 74. Today in clinic feeling N/V, lethargic, and restless, and requested to finish off the clinic in a hurry.  Plan:  - continue plavix and pravastatin for stroke prevention - check BP and glucose at home, avoid low BP - Follow up with your primary care physician for stroke risk factor modification. Recommend maintain blood pressure goal <130/80,  diabetes with hemoglobin A1c goal below 6.5% and lipids with LDL cholesterol goal below 70 mg/dL.  - follow up in 3 months  No orders of the defined types were placed in this encounter.    No orders of the defined types were placed in this encounter.    Patient Instructions  - continue plavix and pravastatin for stroke prevention - check BP and glucose at home - avoid low BP too - Follow up with your primary care physician for stroke risk factor modification. Recommend maintain blood pressure goal <130/80, diabetes with hemoglobin A1c goal below 6.5% and lipids with LDL cholesterol goal below 70 mg/dL.  - follow up in 3 months    Rosalin Hawking, MD PhD [facility] [address] [place], [place] [address] [phone]

## 2015-09-04 ENCOUNTER — Telehealth: Payer: Self-pay | Admitting: Family Medicine

## 2015-09-05 ENCOUNTER — Encounter: Payer: Self-pay | Admitting: Family Medicine

## 2015-09-08 ENCOUNTER — Encounter: Payer: Self-pay | Admitting: Family Medicine

## 2015-09-08 NOTE — Telephone Encounter (Signed)
POC faxed today to [facility]

## 2015-09-15 ENCOUNTER — Other Ambulatory Visit: Payer: Medicare Other

## 2015-09-15 ENCOUNTER — Encounter: Payer: Self-pay | Admitting: Family Medicine

## 2015-09-15 ENCOUNTER — Other Ambulatory Visit: Payer: Self-pay | Admitting: Family Medicine

## 2015-09-15 DIAGNOSIS — N183 Chronic kidney disease, stage 3 unspecified: Secondary | ICD-10-CM

## 2015-09-16 ENCOUNTER — Encounter: Payer: Self-pay | Admitting: Family Medicine

## 2015-09-16 LAB — BMP8+EGFR
BUN / CREAT RATIO: 9 — AB (ref 11–26)
BUN: 19 mg/dL (ref 8–27)
CHLORIDE: 100 mmol/L (ref 97–106)
CO2: 19 mmol/L (ref 18–29)
Calcium: 9.1 mg/dL (ref 8.7–10.3)
Creatinine, Ser: 2.03 mg/dL — ABNORMAL HIGH (ref 0.57–1.00)
GFR calc non Af Amer: 23 mL/min/{1.73_m2} — ABNORMAL LOW (ref >59)
GFR, EST AFRICAN AMERICAN: 27 mL/min/{1.73_m2} — AB (ref >59)
Glucose: 141 mg/dL — ABNORMAL HIGH (ref 65–99)
POTASSIUM: 5.2 mmol/L (ref 3.5–5.2)
Sodium: 133 mmol/L — ABNORMAL LOW (ref 136–144)

## 2015-09-16 LAB — PARATHYROID HORMONE, INTACT (NO CA): PTH: 24 pg/mL (ref 15–65)

## 2015-09-16 LAB — CBC WITH DIFFERENTIAL/PLATELET
BASOS ABS: 0 10*3/uL (ref 0.0–0.2)
Basos: 1 %
EOS (ABSOLUTE): 0.2 10*3/uL (ref 0.0–0.4)
Eos: 4 %
HEMOGLOBIN: 11.1 g/dL (ref 11.1–15.9)
Hematocrit: 32.9 % — ABNORMAL LOW (ref 34.0–46.6)
Immature Grans (Abs): 0 10*3/uL (ref 0.0–0.1)
Immature Granulocytes: 0 %
LYMPHS ABS: 1 10*3/uL (ref 0.7–3.1)
Lymphs: 19 %
MCH: 31.7 pg (ref 26.6–33.0)
MCHC: 33.7 g/dL (ref 31.5–35.7)
MCV: 94 fL (ref 79–97)
Monocytes Absolute: 0.6 10*3/uL (ref 0.1–0.9)
Monocytes: 12 %
NEUTROS ABS: 3.4 10*3/uL (ref 1.4–7.0)
Neutrophils: 64 %
PLATELETS: 211 10*3/uL (ref 150–379)
RBC: 3.5 x10E6/uL — ABNORMAL LOW (ref 3.77–5.28)
RDW: 14.8 % (ref 12.3–15.4)
WBC: 5.2 10*3/uL (ref 3.4–10.8)

## 2015-09-18 ENCOUNTER — Ambulatory Visit (INDEPENDENT_AMBULATORY_CARE_PROVIDER_SITE_OTHER): Payer: Medicare Other | Admitting: Family Medicine

## 2015-09-18 ENCOUNTER — Encounter: Payer: Self-pay | Admitting: Family Medicine

## 2015-09-18 DIAGNOSIS — K3184 Gastroparesis: Secondary | ICD-10-CM

## 2015-09-18 DIAGNOSIS — R110 Nausea: Secondary | ICD-10-CM | POA: Diagnosis not present

## 2015-09-18 DIAGNOSIS — E1143 Type 2 diabetes mellitus with diabetic autonomic (poly)neuropathy: Secondary | ICD-10-CM | POA: Diagnosis not present

## 2015-09-18 NOTE — Assessment & Plan Note (Signed)
Concerned that her nausea and inability eat it's gastroparesis, recommended Reglan, husband says they are to have Reglan just haven't been taken it. He will send [name] a message back that has the dosing of it and if he needs a refill. We will see her back in a week and see if she is improved.

## 2015-09-18 NOTE — Progress Notes (Signed)
Wt    Subjective:    Patient ID: [name], female    DOB: 1935-11-17, 79 y.o.   MRN: [id number]  HPI: [name] is a 79 y.o. female presenting on 09/18/2015 for Nausea   HPI Persistent nausea Patient has been having persistent nausea without vomiting for the past 6 weeks. She initially 6 weeks ago did have some vomiting but since then she just had the nausea. She denies any diarrhea or constipation. She does have an ostomy but says it's functioning well without any issues there. She just feels like she can't eat anything or doesn't want to eat anything. She also feels like she can't sleep at night. She had labs done on 09/15/2015 which showed that her kidney disease is improved and her electrolytes and blood counts were all within normal limits. She denies fevers or chills. She does admit to feeling fatigued and just generally weak. She does has no desire to eat. They have been using Zofran up to 4 times daily and that was working but now they feel like it is not working anymore. She also takes Protonix. Over the past year she's had 2 different EGDs which showed that she had polyps in her stomach but that they were benign. She is unable to describe exactly how her stomach fills, she denies that it's indigestion, pain, bloating, sharp pain.  Relevant past medical, surgical, family and social history reviewed and updated as indicated. Interim medical history since our last visit reviewed. Allergies and medications reviewed and updated.  Review of Systems  Constitutional: Negative for fever and chills.  HENT: Negative for congestion, ear discharge and ear pain.   Eyes: Negative for redness and visual disturbance.  Respiratory: Negative for chest tightness and shortness of breath.   Cardiovascular: Negative for chest pain and leg swelling.  Gastrointestinal: Positive for nausea. Negative for vomiting, abdominal pain, diarrhea, constipation and blood in stool.  Genitourinary:  Negative for dysuria and difficulty urinating.  Musculoskeletal: Negative for back pain and gait problem.  Skin: Negative for rash.  Neurological: Negative for light-headedness and headaches.  Psychiatric/Behavioral: Negative for behavioral problems and agitation.  All other systems reviewed and are negative.   Per HPI unless specifically indicated above     Medication List       This list is accurate as of: 09/18/15 12:20 PM.  Always use your most recent med list.               acetaminophen 500 MG tablet  Commonly known as:  TYLENOL  Take 1,000 mg by mouth every 6 (six) hours as needed for moderate pain.     amLODipine 5 MG tablet  Commonly known as:  NORVASC  Take 5 mg by mouth daily.     clopidogrel 75 MG tablet  Commonly known as:  PLAVIX  Take 1 tablet (75 mg total) by mouth daily.     glucagon 1 MG injection  Commonly known as:  GLUCAGON EMERGENCY  Inject into the muscle as needed for severe low BG     Insulin Glargine 100 UNIT/ML Solostar Pen  Commonly known as:  LANTUS SOLOSTAR  Inject 30 Units into the skin every evening.     insulin lispro 100 UNIT/ML KiwkPen  Commonly known as:  HUMALOG KWIKPEN  Inject 0.05-0.1 mLs (5-10 Units total) into the skin 3 (three) times daily.     levothyroxine 50 MCG tablet  Commonly known as:  SYNTHROID, LEVOTHROID  TAKE 1 TABLET DAILY  metoprolol 50 MG tablet  Commonly known as:  LOPRESSOR  Take 50 mg by mouth 2 (two) times daily.     multivitamin, stress formula tablet  Take 1 tablet by mouth daily.     omega-3 acid ethyl esters 1 G capsule  Commonly known as:  LOVAZA  TAKE (2) CAPSULES TWICE DAILY.     ondansetron 4 MG tablet  Commonly known as:  ZOFRAN  Take 4 mg by mouth every 4 (four) hours as needed for nausea.     ONE TOUCH ULTRA TEST test strip  Generic drug:  glucose blood  Test blood sugar qid prn. DX E11.8     onetouch ultrasoft lancets  CHECK BLOOD SUGAR 4 TIMES A DAY     pantoprazole 40 MG  tablet  Commonly known as:  PROTONIX  TAKE (1) TABLET TWICE A DAY.     PEN NEEDLES 31GX5/16" 31G X 8 MM Misc  USE AS DIRECTED TWICE A DAY     Polyethyl Glycol-Propyl Glycol 0.4-0.3 % Soln  Apply 1 drop to eye 3 (three) times daily as needed (dry eyes).     pravastatin 20 MG tablet  Commonly known as:  PRAVACHOL  Take 1 tablet (20 mg total) by mouth daily at 6 PM.     traMADol 50 MG tablet  Commonly known as:  ULTRAM  Take 1 tablet (50 mg total) by mouth every 6 (six) hours as needed.           Objective:    Wt   Wt Readings from Last 3 Encounters:  09/03/15 170 lb 9.6 oz (77.384 kg)  08/22/15 172 lb (78.019 kg)  08/15/15 172 lb 3.2 oz (78.109 kg)    Physical Exam  Constitutional: She is oriented to person, place, and time. She appears well-developed and well-nourished. No distress.  Eyes: Conjunctivae and EOM are normal. Pupils are equal, round, and reactive to light.  Cardiovascular: Normal rate, regular rhythm, normal heart sounds and intact distal pulses.   No murmur heard. Pulmonary/Chest: Effort normal and breath sounds normal. No respiratory distress. She has no wheezes.  Abdominal: Soft. Bowel sounds are normal. She exhibits no distension and no mass. There is no hepatosplenomegaly. There is tenderness (vague diffuse tenderness). There is no rigidity, no rebound, no guarding, no CVA tenderness, no tenderness at McBurney's point and negative Murphy's sign.  Musculoskeletal: Normal range of motion. She exhibits no edema or tenderness.  Neurological: She is alert and oriented to person, place, and time. Coordination normal.  Skin: Skin is warm and dry. No rash noted. She is not diaphoretic.  Psychiatric: She has a normal mood and affect. Her behavior is normal.  Nursing note and vitals reviewed.   Results for orders placed or performed in visit on 09/15/15  [facility]  Result Value Ref Range   Glucose 141 (H) 65 - 99 mg/dL   BUN 19 8 - 27 mg/dL   Creatinine, Ser  2.03 (H) 0.57 - 1.00 mg/dL   GFR calc non Af Amer 23 (L) >59 mL/min/1.73   GFR calc Af Amer 27 (L) >59 mL/min/1.73   BUN/Creatinine Ratio 9 (L) 11 - 26   Sodium 133 (L) 136 - 144 mmol/L   Potassium 5.2 3.5 - 5.2 mmol/L   Chloride 100 97 - 106 mmol/L   CO2 19 18 - 29 mmol/L   Calcium 9.1 8.7 - 10.3 mg/dL  CBC with Differential/Platelet  Result Value Ref Range   WBC 5.2 3.4 - 10.8 x10E3/uL  RBC 3.50 (L) 3.77 - 5.28 x10E6/uL   Hemoglobin 11.1 11.1 - 15.9 g/dL   Hematocrit 32.9 (L) 34.0 - 46.6 %   MCV 94 79 - 97 fL   MCH 31.7 26.6 - 33.0 pg   MCHC 33.7 31.5 - 35.7 g/dL   RDW 14.8 12.3 - 15.4 %   Platelets 211 150 - 379 x10E3/uL   Neutrophils 64 %   Lymphs 19 %   Monocytes 12 %   Eos 4 %   Basos 1 %   Neutrophils Absolute 3.4 1.4 - 7.0 x10E3/uL   Lymphocytes Absolute 1.0 0.7 - 3.1 x10E3/uL   Monocytes Absolute 0.6 0.1 - 0.9 x10E3/uL   EOS (ABSOLUTE) 0.2 0.0 - 0.4 x10E3/uL   Basophils Absolute 0.0 0.0 - 0.2 x10E3/uL   Immature Granulocytes 0 %   Immature Grans (Abs) 0.0 0.0 - 0.1 x10E3/uL  Parathyroid hormone, intact (no Ca)  Result Value Ref Range   PTH 24 15 - 65 pg/mL      Assessment & Plan:   Problem List Items Addressed This Visit      Digestive   Gastroparesis due to DM [facility])    Concerned that her nausea and inability eat it's gastroparesis, recommended Reglan, husband says they are to have Reglan just haven't been taken it. He will send [name] a message back that has the dosing of it and if he needs a refill. We will see her back in a week and see if she is improved.       Other Visit Diagnoses    Nausea without vomiting    -  Primary        Follow up plan: Return in about 1 week (around 09/25/2015), or if symptoms worsen or fail to improve, for f/u nausea.  Counseling provided for all of the vaccine components No orders of the defined types were placed in this encounter.    Caryl Pina, MD [facility] 09/18/2015, 12:20  PM

## 2015-09-20 ENCOUNTER — Encounter: Payer: Self-pay | Admitting: Family Medicine

## 2015-09-22 ENCOUNTER — Other Ambulatory Visit: Payer: Self-pay | Admitting: Pharmacist

## 2015-09-22 MED ORDER — MUPIROCIN 2 % EX OINT: 1.0000 "application " | TOPICAL_OINTMENT | Freq: Two times a day (BID) | CUTANEOUS | Status: DC

## 2015-09-22 MED ORDER — ONETOUCH ULTRA BLUE VI STRP: ORAL_STRIP | Status: AC

## 2015-09-23 ENCOUNTER — Encounter: Payer: Self-pay | Admitting: Family Medicine

## 2015-09-24 ENCOUNTER — Encounter: Payer: Self-pay | Admitting: Family Medicine

## 2015-09-24 ENCOUNTER — Ambulatory Visit (INDEPENDENT_AMBULATORY_CARE_PROVIDER_SITE_OTHER): Payer: Medicare Other | Admitting: Family Medicine

## 2015-09-24 VITALS — BP 158/56 | HR 52 | Temp 97.4°F

## 2015-09-24 DIAGNOSIS — E1143 Type 2 diabetes mellitus with diabetic autonomic (poly)neuropathy: Secondary | ICD-10-CM

## 2015-09-24 DIAGNOSIS — K3184 Gastroparesis: Secondary | ICD-10-CM | POA: Diagnosis not present

## 2015-09-24 NOTE — Progress Notes (Signed)
BP 158/56 mmHg  Pulse 52  Temp(Src) 97.4 F (36.3 C) (Oral)  Ht   Wt    Subjective:    Patient ID: [name], female    DOB: 12-04-1935, 79 y.o.   MRN: [id number]  HPI: [name] is a 79 y.o. female presenting on 09/24/2015 for Nausea   HPI Persistent nausea Patient has been having persistent nausea without vomiting for the past 7 weeks. She initially 7 weeks ago did have some vomiting but since then she just had the nausea. She denies any diarrhea or constipation. She does have an ostomy but says it's functioning well without any issues there. She feels improved after starting the Reglan and is keeping food down a little bit better. She still feels just generally ill but that is slightly improved this week. She is having uses Zofran last while on the Reglan.  Relevant past medical, surgical, family and social history reviewed and updated as indicated. Interim medical history since our last visit reviewed. Allergies and medications reviewed and updated.  Review of Systems  Constitutional: Negative for fever and chills.  HENT: Negative for congestion, ear discharge and ear pain.   Eyes: Negative for redness and visual disturbance.  Respiratory: Negative for chest tightness and shortness of breath.   Cardiovascular: Negative for chest pain and leg swelling.  Gastrointestinal: Positive for nausea. Negative for vomiting, abdominal pain, diarrhea, constipation and blood in stool.  Genitourinary: Negative for dysuria and difficulty urinating.  Musculoskeletal: Negative for back pain and gait problem.  Skin: Negative for rash.  Neurological: Negative for light-headedness and headaches.  Psychiatric/Behavioral: Negative for behavioral problems and agitation.  All other systems reviewed and are negative.   Per HPI unless specifically indicated above     Medication List       This list is accurate as of: 09/24/15  9:23 AM.  Always use your most recent med list.               acetaminophen 500 MG tablet  Commonly known as:  TYLENOL  Take 1,000 mg by mouth every 6 (six) hours as needed for moderate pain.     amLODipine 5 MG tablet  Commonly known as:  NORVASC  Take 5 mg by mouth daily.     clopidogrel 75 MG tablet  Commonly known as:  PLAVIX  Take 1 tablet (75 mg total) by mouth daily.     glucagon 1 MG injection  Commonly known as:  GLUCAGON EMERGENCY  Inject into the muscle as needed for severe low BG     Insulin Glargine 100 UNIT/ML Solostar Pen  Commonly known as:  LANTUS SOLOSTAR  Inject 30 Units into the skin every evening.     insulin lispro 100 UNIT/ML KiwkPen  Commonly known as:  HUMALOG KWIKPEN  Inject 0.05-0.1 mLs (5-10 Units total) into the skin 3 (three) times daily.     levothyroxine 50 MCG tablet  Commonly known as:  SYNTHROID, LEVOTHROID  TAKE 1 TABLET DAILY     metoprolol 50 MG tablet  Commonly known as:  LOPRESSOR  Take 50 mg by mouth 2 (two) times daily.     multivitamin, stress formula tablet  Take 1 tablet by mouth daily.     mupirocin ointment 2 %  Commonly known as:  BACTROBAN  Apply 1 application topically 2 (two) times daily.     omega-3 acid ethyl esters 1 G capsule  Commonly known as:  LOVAZA  TAKE (2) CAPSULES TWICE DAILY.  ondansetron 4 MG tablet  Commonly known as:  ZOFRAN  Take 4 mg by mouth every 4 (four) hours as needed for nausea.     ONE TOUCH ULTRA TEST test strip  Generic drug:  glucose blood  Test blood sugar up to 5 times per day prn. DX E11.8     onetouch ultrasoft lancets  CHECK BLOOD SUGAR 4 TIMES A DAY     pantoprazole 40 MG tablet  Commonly known as:  PROTONIX  TAKE (1) TABLET TWICE A DAY.     PEN NEEDLES 31GX5/16" 31G X 8 MM Misc  USE AS DIRECTED TWICE A DAY     Polyethyl Glycol-Propyl Glycol 0.4-0.3 % Soln  Apply 1 drop to eye 3 (three) times daily as needed (dry eyes).     pravastatin 20 MG tablet  Commonly known as:  PRAVACHOL  Take 1 tablet (20 mg total) by  mouth daily at 6 PM.     traMADol 50 MG tablet  Commonly known as:  ULTRAM  Take 1 tablet (50 mg total) by mouth every 6 (six) hours as needed.           Objective:    BP 158/56 mmHg  Pulse 52  Temp(Src) 97.4 F (36.3 C) (Oral)  Ht   Wt   Wt Readings from Last 3 Encounters:  09/03/15 170 lb 9.6 oz (77.384 kg)  08/22/15 172 lb (78.019 kg)  08/15/15 172 lb 3.2 oz (78.109 kg)    Physical Exam  Constitutional: She is oriented to person, place, and time. She appears well-developed and well-nourished. No distress.  Eyes: Conjunctivae and EOM are normal. Pupils are equal, round, and reactive to light.  Cardiovascular: Normal rate, regular rhythm, normal heart sounds and intact distal pulses.   No murmur heard. Pulmonary/Chest: Effort normal and breath sounds normal. No respiratory distress. She has no wheezes.  Abdominal: Soft. Bowel sounds are normal. She exhibits no distension and no mass. There is no hepatosplenomegaly. There is no tenderness (vague diffuse tenderness resolved). There is no rigidity, no rebound, no guarding, no CVA tenderness, no tenderness at McBurney's point and negative Murphy's sign.  Musculoskeletal: Normal range of motion. She exhibits no edema or tenderness.  Neurological: She is alert and oriented to person, place, and time. Coordination normal.  Skin: Skin is warm and dry. No rash noted. She is not diaphoretic.  Psychiatric: She has a normal mood and affect. Her behavior is normal.  Nursing note and vitals reviewed.      Assessment & Plan:   Problem List Items Addressed This Visit      Digestive   Gastroparesis due to DM ([place]) - Primary    Reglan helping, continue and monitor          Follow up plan: Return if symptoms worsen or fail to improve.  Counseling provided for all of the vaccine components No orders of the defined types were placed in this encounter.    Caryl Pina, MD [facility] 09/24/2015, 9:23  AM

## 2015-09-24 NOTE — Assessment & Plan Note (Signed)
Reglan helping, continue and monitor

## 2015-09-27 ENCOUNTER — Other Ambulatory Visit: Payer: Self-pay | Admitting: Family Medicine

## 2015-09-28 ENCOUNTER — Emergency Department (HOSPITAL_COMMUNITY)
Admission: EM | Admit: 2015-09-28 | Discharge: 2015-09-28 | Disposition: A | Discharge status: Home / Self Care | Payer: Medicare Other | Attending: Emergency Medicine | Admitting: Emergency Medicine

## 2015-09-28 ENCOUNTER — Encounter (HOSPITAL_COMMUNITY): Payer: Self-pay | Admitting: Emergency Medicine

## 2015-09-28 ENCOUNTER — Emergency Department (HOSPITAL_COMMUNITY): Payer: Medicare Other

## 2015-09-28 ENCOUNTER — Encounter: Payer: Self-pay | Admitting: Family Medicine

## 2015-09-28 DIAGNOSIS — M25512 Pain in left shoulder: Secondary | ICD-10-CM | POA: Diagnosis not present

## 2015-09-28 DIAGNOSIS — K219 Gastro-esophageal reflux disease without esophagitis: Secondary | ICD-10-CM | POA: Diagnosis not present

## 2015-09-28 DIAGNOSIS — M25511 Pain in right shoulder: Secondary | ICD-10-CM | POA: Diagnosis not present

## 2015-09-28 DIAGNOSIS — E119 Type 2 diabetes mellitus without complications: Secondary | ICD-10-CM | POA: Insufficient documentation

## 2015-09-28 DIAGNOSIS — R51 Headache: Secondary | ICD-10-CM | POA: Insufficient documentation

## 2015-09-28 DIAGNOSIS — R112 Nausea with vomiting, unspecified: Secondary | ICD-10-CM | POA: Diagnosis present

## 2015-09-28 DIAGNOSIS — Z880 Allergy status to penicillin: Secondary | ICD-10-CM | POA: Diagnosis not present

## 2015-09-28 DIAGNOSIS — K3184 Gastroparesis: Secondary | ICD-10-CM | POA: Insufficient documentation

## 2015-09-28 DIAGNOSIS — Z794 Long term (current) use of insulin: Secondary | ICD-10-CM | POA: Insufficient documentation

## 2015-09-28 DIAGNOSIS — Z79899 Other long term (current) drug therapy: Secondary | ICD-10-CM | POA: Diagnosis not present

## 2015-09-28 DIAGNOSIS — Z86018 Personal history of other benign neoplasm: Secondary | ICD-10-CM | POA: Diagnosis not present

## 2015-09-28 DIAGNOSIS — Z8614 Personal history of Methicillin resistant Staphylococcus aureus infection: Secondary | ICD-10-CM | POA: Diagnosis not present

## 2015-09-28 DIAGNOSIS — N189 Chronic kidney disease, unspecified: Secondary | ICD-10-CM | POA: Insufficient documentation

## 2015-09-28 DIAGNOSIS — N390 Urinary tract infection, site not specified: Secondary | ICD-10-CM | POA: Diagnosis not present

## 2015-09-28 DIAGNOSIS — I129 Hypertensive chronic kidney disease with stage 1 through stage 4 chronic kidney disease, or unspecified chronic kidney disease: Secondary | ICD-10-CM | POA: Diagnosis not present

## 2015-09-28 DIAGNOSIS — Z862 Personal history of diseases of the blood and blood-forming organs and certain disorders involving the immune mechanism: Secondary | ICD-10-CM | POA: Insufficient documentation

## 2015-09-28 DIAGNOSIS — Z8673 Personal history of transient ischemic attack (TIA), and cerebral infarction without residual deficits: Secondary | ICD-10-CM | POA: Diagnosis not present

## 2015-09-28 DIAGNOSIS — E785 Hyperlipidemia, unspecified: Secondary | ICD-10-CM | POA: Diagnosis not present

## 2015-09-28 DIAGNOSIS — Z86010 Personal history of colonic polyps: Secondary | ICD-10-CM | POA: Insufficient documentation

## 2015-09-28 DIAGNOSIS — Z7902 Long term (current) use of antithrombotics/antiplatelets: Secondary | ICD-10-CM | POA: Insufficient documentation

## 2015-09-28 LAB — URINALYSIS, ROUTINE W REFLEX MICROSCOPIC
Bilirubin Urine: NEGATIVE
Glucose, UA: NEGATIVE mg/dL
Hgb urine dipstick: NEGATIVE
Ketones, ur: NEGATIVE mg/dL
NITRITE: NEGATIVE
Specific Gravity, Urine: 1.019 (ref 1.005–1.030)
pH: 5 (ref 5.0–8.0)

## 2015-09-28 LAB — URINE MICROSCOPIC-ADD ON: RBC / HPF: NONE SEEN RBC/hpf (ref 0–5)

## 2015-09-28 LAB — COMPREHENSIVE METABOLIC PANEL
ALK PHOS: 132 U/L — AB (ref 38–126)
ALT: 24 U/L (ref 14–54)
AST: 39 U/L (ref 15–41)
Albumin: 3.1 g/dL — ABNORMAL LOW (ref 3.5–5.0)
Anion gap: 7 (ref 5–15)
BUN: 26 mg/dL — ABNORMAL HIGH (ref 6–20)
CALCIUM: 9.5 mg/dL (ref 8.9–10.3)
CHLORIDE: 110 mmol/L (ref 101–111)
CO2: 22 mmol/L (ref 22–32)
CREATININE: 2.36 mg/dL — AB (ref 0.44–1.00)
GFR, EST AFRICAN AMERICAN: 22 mL/min — AB (ref >60)
GFR, EST NON AFRICAN AMERICAN: 19 mL/min — AB (ref >60)
Glucose, Bld: 134 mg/dL — ABNORMAL HIGH (ref 65–99)
Potassium: 4.5 mmol/L (ref 3.5–5.1)
Sodium: 139 mmol/L (ref 135–145)
TOTAL PROTEIN: 6.7 g/dL (ref 6.5–8.1)
Total Bilirubin: 0.4 mg/dL (ref 0.3–1.2)

## 2015-09-28 LAB — CBC
HCT: 35 % — ABNORMAL LOW (ref 36.0–46.0)
Hemoglobin: 11.4 g/dL — ABNORMAL LOW (ref 12.0–15.0)
MCH: 31.8 pg (ref 26.0–34.0)
MCHC: 32.6 g/dL (ref 30.0–36.0)
MCV: 97.5 fL (ref 78.0–100.0)
PLATELETS: 221 10*3/uL (ref 150–400)
RBC: 3.59 MIL/uL — AB (ref 3.87–5.11)
RDW: 14 % (ref 11.5–15.5)
WBC: 6.5 10*3/uL (ref 4.0–10.5)

## 2015-09-28 LAB — LIPASE, BLOOD: LIPASE: 18 U/L (ref 11–51)

## 2015-09-28 LAB — I-STAT CG4 LACTIC ACID, ED: LACTIC ACID, VENOUS: 1.5 mmol/L (ref 0.5–2.0)

## 2015-09-28 MED ORDER — SODIUM CHLORIDE 0.9 % IV BOLUS (SEPSIS)
1000.0000 mL | Freq: Once | INTRAVENOUS | Status: AC
  Administered 2015-09-28: 1000 mL via INTRAVENOUS

## 2015-09-28 MED ORDER — DEXTROSE 5 % IV SOLN
1.0000 g | Freq: Once | INTRAVENOUS | Status: AC
  Administered 2015-09-28: 1 g via INTRAVENOUS
  Filled 2015-09-28: qty 10

## 2015-09-28 MED ORDER — CEPHALEXIN 500 MG PO CAPS
500.0000 mg | ORAL_CAPSULE | Freq: Two times a day (BID) | ORAL | Status: DC
Start: 2015-09-28 — End: 2015-10-28

## 2015-09-28 MED ORDER — ONDANSETRON HCL 4 MG/2ML IJ SOLN
4.0000 mg | Freq: Once | INTRAMUSCULAR | Status: AC
  Administered 2015-09-28: 4 mg via INTRAVENOUS
  Filled 2015-09-28: qty 2

## 2015-09-28 NOTE — ED Provider Notes (Signed)
CSN: [id number]     Arrival date & time 09/28/15  1122 History   First MD Initiated Contact with Patient 09/28/15 1230     Chief Complaint  Patient presents with  . Nausea  . Emesis  . Headache     (Consider location/radiation/quality/duration/timing/severity/associated sxs/prior Treatment) HPI Comments: 79 year old female with extensive past medical history including IDDM, hypertension, hyperlipidemia, CK D, CVA, gastroparesis, who presents with nausea and vomiting. History obtained with the assistance of the patient's husband. He reports that this morning shortly after waking up, the patient began having nausea and vomiting. She has had these symptoms previously with gastroparesis. She did at one point complain of some pain between her shoulders. He notes that she has some confusion at baseline but her confusion seemed to be slightly worse this morning. No unilateral weakness, slurred speech, or facial droop. No fevers or change in her ostomy output. Blood sugar has been stable near 200.  Patient is a 79 y.o. female presenting with vomiting and headaches. The history is provided by the spouse and the patient.  Emesis Associated symptoms: headaches   Headache Associated symptoms: vomiting     Past Medical History  Diagnosis Date  . Diverticulitis 2005    Colonoscopy--Dr. Sharlett Iles   . Diabetes mellitus without complication ([place])   . Hyperlipidemia   . Hypertension   . Stroke [facility])     Past history  . Hepatic steatosis   . Anemia   . Arthritis   . Chronic kidney disease (CKD)     past dialysis  . Status post dilation of esophageal narrowing   . MRSA infection 07/2009  . Proteinuria 09/23/2014  . MGUS (monoclonal gammopathy of unknown significance) 10/10/2014  . Hyperplastic colon polyp   . Fundic gland polyps of stomach, benign   . GERD (gastroesophageal reflux disease)   . Gastroparesis   . CVA (cerebral vascular accident) [facility])    Past Surgical History  Procedure  Laterality Date  . Bilateral oophorectomy    . Rotator cuff repair Bilateral   . Carpal tunnel repair Bilateral   . Laparoscopic colostomy      for diverticulitis  . Nasal sinus surgery    . Tonsillectomy    . Replacement total knee Right   . Abdominal hernia repair      with ilestomy  . Partial colectomy    . Esophagogastroduodenoscopy N/A 07/16/2015    Procedure: ESOPHAGOGASTRODUODENOSCOPY (EGD);  Surgeon: Manus Gunning, MD;  Location: [facility];  Service: Gastroenterology;  Laterality: N/A;   Family History  Problem Relation Age of Onset  . Diabetes Mother   . Heart disease Father 21    Died after bypass  . Diabetes Sister   . Colon cancer Neg Hx    Social History  Substance Use Topics  . Smoking status: Never Smoker   . Smokeless tobacco: Never Used  . Alcohol Use: No   OB History    No data available     Review of Systems  Gastrointestinal: Positive for vomiting.  Neurological: Positive for headaches.    10 Systems reviewed and are negative for acute change except as noted in the HPI.   Allergies  Ace inhibitors; Actos; Aspirin; Codeine; Hydromorphone hcl; Metformin and related; Penicillins; Procaine hcl; Statins; and Sulfa antibiotics  Home Medications   Prior to Admission medications   Medication Sig Start Date End Date Taking? Authorizing Provider  acetaminophen (TYLENOL) 500 MG tablet Take 1,000 mg by mouth every 6 (six) hours as needed for  moderate pain.    Yes Historical Provider, MD  amLODipine (NORVASC) 10 MG tablet TAKE 1 TABLET ONCE A DAY Patient taking differently: TAKE ONE-HALF OF A TABLET ONCE A DAY 09/27/15  Yes Fransisca Kaufmann Dettinger, MD  clopidogrel (PLAVIX) 75 MG tablet Take 1 tablet (75 mg total) by mouth daily. 08/19/15  Yes Chipper Herb, MD  glucagon (GLUCAGON EMERGENCY) 1 MG injection Inject into the muscle as needed for severe low BG 06/16/15  Yes Tammy Eckard, PHARMD  Insulin Glargine (LANTUS SOLOSTAR) 100 UNIT/ML Solostar Pen  Inject 30 Units into the skin every evening. Patient taking differently: Inject 38 Units into the skin every evening.  08/28/15  Yes Tammy Eckard, PHARMD  insulin lispro (HUMALOG KWIKPEN) 100 UNIT/ML KiwkPen Inject 0.05-0.1 mLs (5-10 Units total) into the skin 3 (three) times daily. 08/18/15  Yes Tammy Eckard, PHARMD  levothyroxine (SYNTHROID, LEVOTHROID) 50 MCG tablet TAKE 1 TABLET DAILY 09/19/14  Yes Chipper Herb, MD  metoCLOPramide (REGLAN) 5 MG tablet Take 5 mg by mouth 3 (three) times daily before meals.  09/24/15  Yes Historical Provider, MD  metoprolol (LOPRESSOR) 50 MG tablet Take 50 mg by mouth 2 (two) times daily.   Yes Historical Provider, MD  mupirocin ointment (BACTROBAN) 2 % Apply 1 application topically 2 (two) times daily. Patient taking differently: Apply 1 application topically 2 (two) times daily as needed (FOR RASH).  09/22/15  Yes Fransisca Kaufmann Dettinger, MD  omega-3 acid ethyl esters (LOVAZA) 1 G capsule TAKE (2) CAPSULES TWICE DAILY. 09/06/14  Yes Chipper Herb, MD  ondansetron (ZOFRAN) 8 MG tablet Take 8 mg by mouth every 8 (eight) hours as needed for nausea.  09/27/15  Yes Historical Provider, MD  pantoprazole (PROTONIX) 40 MG tablet TAKE (1) TABLET TWICE A DAY. 09/27/15  Yes Fransisca Kaufmann Dettinger, MD  Polyethyl Glycol-Propyl Glycol 0.4-0.3 % SOLN Apply 1 drop to eye 3 (three) times daily as needed (dry eyes).   Yes Historical Provider, MD  pravastatin (PRAVACHOL) 20 MG tablet Take 1 tablet (20 mg total) by mouth daily at 6 PM. 08/19/15  Yes Chipper Herb, MD  traMADol (ULTRAM) 50 MG tablet Take 1 tablet (50 mg total) by mouth every 6 (six) hours as needed. Patient taking differently: Take 50 mg by mouth every 6 (six) hours as needed for moderate pain.  11/06/13  Yes Sable Feil, MD  cephALEXin (KEFLEX) 500 MG capsule Take 1 capsule (500 mg total) by mouth 2 (two) times daily. 09/28/15   Sharlett Iles, MD  Insulin Pen Needle (PEN NEEDLES 31GX5/16") 31G X 8 MM MISC USE  AS DIRECTED TWICE A DAY Patient taking differently: 1 each by Other route 3 (three) times daily. USE AS DIRECTED TWICE A DAY 05/27/15   Chipper Herb, MD  Lancets [facility] ULTRASOFT) lancets CHECK BLOOD SUGAR 4 TIMES A DAY 09/15/15   Chipper Herb, MD  ONE TOUCH ULTRA TEST test strip Test blood sugar up to 5 times per day prn. DX E11.8 09/22/15   Chipper Herb, MD   BP 185/52 mmHg  Pulse 61  Temp(Src) 98.2 F (36.8 C) (Oral)  Resp 17  SpO2 97% Physical Exam  Constitutional: She appears well-developed and well-nourished. No distress.  Awake, alert  HENT:  Head: Normocephalic and atraumatic.  Mouth/Throat: Oropharynx is clear and moist.  Eyes: Conjunctivae and EOM are normal. Pupils are equal, round, and reactive to light.  Neck: Neck supple.  Cardiovascular: Normal rate, regular rhythm and normal heart  sounds.   No murmur heard. Pulmonary/Chest: Effort normal and breath sounds normal. No respiratory distress.  Abdominal: Soft. Bowel sounds are normal. She exhibits no distension.  Mild tenderness of periumbilical abd w/ no rebound or guarding, colostomy in LLQ draining stool  Musculoskeletal:  Trace b/l LE edema  Neurological: She is alert. She has normal reflexes. No cranial nerve deficit. She exhibits normal muscle tone.  Oriented to person and place, occasional difficulty with short term memory but able to answer questions and follow directions, Fluent speech, normal finger-to-nose testing, negative pronator drift  Skin: Skin is warm and dry.  Psychiatric: She has a normal mood and affect. Judgment and thought content normal.  Nursing note and vitals reviewed.   ED Course  Procedures (including critical care time) Labs Review Labs Reviewed  COMPREHENSIVE METABOLIC PANEL - Abnormal; Notable for the following:    Glucose, Bld 134 (*)    BUN 26 (*)    Creatinine, Ser 2.36 (*)    Albumin 3.1 (*)    Alkaline Phosphatase 132 (*)    GFR calc non Af Amer 19 (*)    GFR calc Af  Amer 22 (*)    All other components within normal limits  CBC - Abnormal; Notable for the following:    RBC 3.59 (*)    Hemoglobin 11.4 (*)    HCT 35.0 (*)    All other components within normal limits  URINALYSIS, ROUTINE W REFLEX MICROSCOPIC (NOT AT [facility]) - Abnormal; Notable for the following:    Protein, ur >300 (*)    Leukocytes, UA SMALL (*)    All other components within normal limits  URINE MICROSCOPIC-ADD ON - Abnormal; Notable for the following:    Squamous Epithelial / LPF 0-5 (*)    Bacteria, UA RARE (*)    All other components within normal limits  URINE CULTURE  LIPASE, BLOOD  I-STAT CG4 LACTIC ACID, ED    Imaging Review Ct Head Wo Contrast  09/28/2015  CLINICAL DATA:  Nausea and vomiting upon awakening this morning. Vomiting. Generalized weakness. Increased confusion. EXAM: CT HEAD WITHOUT CONTRAST TECHNIQUE: Contiguous axial images were obtained from the base of the skull through the vertex without intravenous contrast. COMPARISON:  Head MRI 07/17/2015 and CT 07/16/2015 FINDINGS: Chronic lacunar infarcts are noted in the left thalamus, left caudate, and left frontal white matter. Patchy to confluent hypodensities elsewhere involving the subcortical and deep cerebral white matter are compatible with relatively extensive chronic small vessel ischemic disease. Mild-to-moderate cerebral atrophy is unchanged. There is no evidence of acute cortical infarct, intracranial hemorrhage, mass, midline shift, or extra-axial fluid collection. Orbits are unremarkable. No significant inflammatory changes are seen in the visualized paranasal sinuses are mastoid air cells. No skull fracture or aggressive osseous lesion. Calcified atherosclerosis at the skullbase. IMPRESSION: 1. No evidence of acute intracranial abnormality. 2. Extensive chronic small vessel ischemic disease. Electronically Signed   By: Logan Bores M.D.   On: 09/28/2015 14:41   I have personally reviewed and evaluated these lab  results as part of my medical decision-making.   EKG Interpretation None     Medications  cefTRIAXone (ROCEPHIN) 1 g in dextrose 5 % 50 mL IVPB (not administered)  ondansetron (ZOFRAN) injection 4 mg (4 mg Intravenous Given 09/28/15 1339)  sodium chloride 0.9 % bolus 1,000 mL (0 mLs Intravenous Stopped 09/28/15 1414)    MDM   Final diagnoses:  UTI (lower urinary tract infection)  Non-intractable vomiting with nausea, vomiting of unspecified type  Pt p/w nausea and vomiting that began this morning and mild increase in baseline confusion according to husband. On exam, the patient was awake, alert, comfortable and in no acute distress.Pt hypertensive but the remainder of her vital signs unremarkable. Mild periumbilical tenderness but no lower abdominal tenderness and no distention. No neurologic deficits on exam. Obtained above labs and gave the patient Zofran and IV fluid bolus. Obtained a head CT given report of confusion. Head CT was negative acute. Labs show stable CK D with creatinine of 2.36, catheterized UA with some white blood cells, leukocytes, and bacteria. Urine culture sent, gave patient ceftriaxone.  On reexamination, the patient states that her nausea has resolved and she has been able to drink fluids and eat sandwich. His custom treatment of UTI with Keflex. Instructed to follow-up with PCP this week for reevaluation. Patient already has appointment with nephrologist in the next week. Return precautions reviewed and the patient's husband voiced understanding. Patient discharged in satisfactory condition.  Sharlett Iles, MD 09/28/15 [phone]

## 2015-09-28 NOTE — ED Notes (Signed)
Pt states that she woke up this morning with NV.  Husband reported stroke symptoms however, pt denies unilateral weakness (just generalized), no slurred speech, no facial droop, AxO x 4.

## 2015-09-29 LAB — URINE CULTURE: Special Requests: NORMAL

## 2015-09-30 ENCOUNTER — Encounter: Payer: Self-pay | Admitting: Family Medicine

## 2015-10-01 ENCOUNTER — Encounter: Payer: Self-pay | Admitting: Family Medicine

## 2015-10-01 ENCOUNTER — Encounter (INDEPENDENT_AMBULATORY_CARE_PROVIDER_SITE_OTHER): Payer: Medicare Other | Admitting: Family Medicine

## 2015-10-01 ENCOUNTER — Other Ambulatory Visit: Payer: Self-pay | Admitting: Family Medicine

## 2015-10-01 ENCOUNTER — Ambulatory Visit (INDEPENDENT_AMBULATORY_CARE_PROVIDER_SITE_OTHER): Payer: Medicare Other | Admitting: Family Medicine

## 2015-10-01 ENCOUNTER — Other Ambulatory Visit: Payer: Self-pay | Admitting: Nurse Practitioner

## 2015-10-01 VITALS — BP 132/63 | HR 57 | Temp 98.1°F

## 2015-10-01 DIAGNOSIS — E1159 Type 2 diabetes mellitus with other circulatory complications: Secondary | ICD-10-CM

## 2015-10-01 DIAGNOSIS — R112 Nausea with vomiting, unspecified: Secondary | ICD-10-CM | POA: Diagnosis not present

## 2015-10-01 DIAGNOSIS — I10 Essential (primary) hypertension: Secondary | ICD-10-CM | POA: Diagnosis not present

## 2015-10-01 DIAGNOSIS — N179 Acute kidney failure, unspecified: Secondary | ICD-10-CM

## 2015-10-01 NOTE — Assessment & Plan Note (Signed)
Increase Humalog to 12 units 3 times a day with meals, keep Lantus at 38 units at bedtime.

## 2015-10-01 NOTE — Assessment & Plan Note (Signed)
Her blood pressure is controlled on current medication.

## 2015-10-01 NOTE — Progress Notes (Signed)
BP 132/63 mmHg  Pulse 57  Temp(Src) 98.1 F (36.7 C) (Oral)  Wt    Subjective:    Patient ID: [name], female    DOB: 10/04/1936, 79 y.o.   MRN: [id number]  HPI: [name] is a 79 y.o. female presenting on 10/01/2015 for ER Followup   HPI Hypertension  Patient is coming in today for a hypertension check. She is currently on 5 mg Norvasc and metoprolol 50 mg twice a day. Patient denies headaches, blurred vision, chest pains, shortness of breath, or weakness. Denies any side effects from medication and is content with current medication.   Nausea and vomiting Patient's nausea and vomiting has been controlled mostly on the Reglan and Zofran. She is only been having to use Zofran twice a day instead of 3 times a day while on the Reglan. She did have an episode where she had to go in the ER last week because of urinary tract infection and had more nausea that day but has been better since.  Type 2 diabetes Patient comes in for a check for diabetes. Blood sugars are running between the 120s and 160s in the morning and 160s in the 250s pre-lunch and 130s to 307 pre-dinner. She denies any issues with increased thirst or increased urinary output. She denies any tingling or numbness in her feet.  Relevant past medical, surgical, family and social history reviewed and updated as indicated. Interim medical history since our last visit reviewed. Allergies and medications reviewed and updated.  Review of Systems  Constitutional: Negative for fever and chills.  HENT: Negative for congestion, ear discharge and ear pain.   Eyes: Negative for redness and visual disturbance.  Respiratory: Negative for chest tightness and shortness of breath.   Cardiovascular: Negative for chest pain and leg swelling.  Gastrointestinal: Positive for nausea. Negative for abdominal pain.  Genitourinary: Negative for dysuria and difficulty urinating.  Musculoskeletal: Negative for back pain and gait  problem.  Skin: Negative for rash.  Neurological: Negative for dizziness, light-headedness and headaches.  Psychiatric/Behavioral: Negative for behavioral problems and agitation.  All other systems reviewed and are negative.   Per HPI unless specifically indicated above     Medication List       This list is accurate as of: 10/01/15 11:46 AM.  Always use your most recent med list.               acetaminophen 500 MG tablet  Commonly known as:  TYLENOL  Take 1,000 mg by mouth every 6 (six) hours as needed for moderate pain.     amLODipine 10 MG tablet  Commonly known as:  NORVASC  TAKE 1 TABLET ONCE A DAY     cephALEXin 500 MG capsule  Commonly known as:  KEFLEX  Take 1 capsule (500 mg total) by mouth 2 (two) times daily.     clopidogrel 75 MG tablet  Commonly known as:  PLAVIX  Take 1 tablet (75 mg total) by mouth daily.     glucagon 1 MG injection  Commonly known as:  GLUCAGON EMERGENCY  Inject into the muscle as needed for severe low BG     Insulin Glargine 100 UNIT/ML Solostar Pen  Commonly known as:  LANTUS SOLOSTAR  Inject 30 Units into the skin every evening.     insulin lispro 100 UNIT/ML KiwkPen  Commonly known as:  HUMALOG KWIKPEN  Inject 0.05-0.1 mLs (5-10 Units total) into the skin 3 (three) times daily.  levothyroxine 50 MCG tablet  Commonly known as:  SYNTHROID, LEVOTHROID  TAKE 1 TABLET DAILY     metoCLOPramide 5 MG tablet  Commonly known as:  REGLAN  Take 5 mg by mouth 3 (three) times daily before meals.     metoprolol 50 MG tablet  Commonly known as:  LOPRESSOR  Take 50 mg by mouth 2 (two) times daily.     mupirocin ointment 2 %  Commonly known as:  BACTROBAN  Apply 1 application topically 2 (two) times daily.     omega-3 acid ethyl esters 1 G capsule  Commonly known as:  LOVAZA  TAKE (2) CAPSULES TWICE DAILY.     ondansetron 8 MG tablet  Commonly known as:  ZOFRAN  Take 8 mg by mouth every 8 (eight) hours as needed for nausea.       ONE TOUCH ULTRA TEST test strip  Generic drug:  glucose blood  Test blood sugar up to 5 times per day prn. DX E11.8     onetouch ultrasoft lancets  CHECK BLOOD SUGAR 4 TIMES A DAY     pantoprazole 40 MG tablet  Commonly known as:  PROTONIX  TAKE (1) TABLET TWICE A DAY.     PEN NEEDLES 31GX5/16" 31G X 8 MM Misc  USE AS DIRECTED TWICE A DAY     Polyethyl Glycol-Propyl Glycol 0.4-0.3 % Soln  Apply 1 drop to eye 3 (three) times daily as needed (dry eyes).     pravastatin 20 MG tablet  Commonly known as:  PRAVACHOL  Take 1 tablet (20 mg total) by mouth daily at 6 PM.     traMADol 50 MG tablet  Commonly known as:  ULTRAM  Take 1 tablet (50 mg total) by mouth every 6 (six) hours as needed.           Objective:    BP 132/63 mmHg  Pulse 57  Temp(Src) 98.1 F (36.7 C) (Oral)  Wt   Wt Readings from Last 3 Encounters:  09/03/15 170 lb 9.6 oz (77.384 kg)  08/22/15 172 lb (78.019 kg)  08/15/15 172 lb 3.2 oz (78.109 kg)    Physical Exam  Constitutional: She is oriented to person, place, and time. She appears well-developed and well-nourished. No distress.  Eyes: Conjunctivae and EOM are normal. Pupils are equal, round, and reactive to light.  Neck: Neck supple. No thyromegaly present.  Cardiovascular: Normal rate, regular rhythm, normal heart sounds and intact distal pulses.   No murmur heard. Pulmonary/Chest: Effort normal and breath sounds normal. No respiratory distress. She has no wheezes.  Abdominal: Soft. Bowel sounds are normal. She exhibits no distension. There is no tenderness.  Musculoskeletal: Normal range of motion. She exhibits no edema or tenderness.  Lymphadenopathy:    She has no cervical adenopathy.  Neurological: She is alert and oriented to person, place, and time. Coordination normal.  Skin: Skin is warm and dry. No rash noted. She is not diaphoretic.  Psychiatric: She has a normal mood and affect. Her behavior is normal.  Nursing note and vitals  reviewed.   Diabetic Foot Exam - Simple   Simple Foot Form  Diabetic Foot exam was performed with the following findings:  Yes 10/01/2015 11:43 AM  Visual Inspection  No deformities, no ulcerations, no other skin breakdown bilaterally:  Yes  Sensation Testing  Intact to touch and monofilament testing bilaterally:  Yes  Pulse Check  Posterior Tibialis and Dorsalis pulse intact bilaterally:  Yes  Comments  Results for orders placed or performed during the hospital encounter of 09/28/15  Urine culture  Result Value Ref Range   Specimen Description URINE, CATHETERIZED    Special Requests Normal    Culture      MULTIPLE SPECIES PRESENT, SUGGEST RECOLLECTION Performed at [facility]    Report Status 09/29/2015 FINAL   Lipase, blood  Result Value Ref Range   Lipase 18 11 - 51 U/L  Comprehensive metabolic panel  Result Value Ref Range   Sodium 139 135 - 145 mmol/L   Potassium 4.5 3.5 - 5.1 mmol/L   Chloride 110 101 - 111 mmol/L   CO2 22 22 - 32 mmol/L   Glucose, Bld 134 (H) 65 - 99 mg/dL   BUN 26 (H) 6 - 20 mg/dL   Creatinine, Ser 2.36 (H) 0.44 - 1.00 mg/dL   Calcium 9.5 8.9 - 10.3 mg/dL   Total Protein 6.7 6.5 - 8.1 g/dL   Albumin 3.1 (L) 3.5 - 5.0 g/dL   AST 39 15 - 41 U/L   ALT 24 14 - 54 U/L   Alkaline Phosphatase 132 (H) 38 - 126 U/L   Total Bilirubin 0.4 0.3 - 1.2 mg/dL   GFR calc non Af Amer 19 (L) >60 mL/min   GFR calc Af Amer 22 (L) >60 mL/min   Anion gap 7 5 - 15  CBC  Result Value Ref Range   WBC 6.5 4.0 - 10.5 K/uL   RBC 3.59 (L) 3.87 - 5.11 MIL/uL   Hemoglobin 11.4 (L) 12.0 - 15.0 g/dL   HCT 35.0 (L) 36.0 - 46.0 %   MCV 97.5 78.0 - 100.0 fL   MCH 31.8 26.0 - 34.0 pg   MCHC 32.6 30.0 - 36.0 g/dL   RDW 14.0 11.5 - 15.5 %   Platelets 221 150 - 400 K/uL  Urinalysis, Routine w reflex microscopic (not at [facility])  Result Value Ref Range   Color, Urine YELLOW YELLOW   APPearance CLEAR CLEAR   Specific Gravity, Urine 1.019 1.005 - 1.030   pH 5.0  5.0 - 8.0   Glucose, UA NEGATIVE NEGATIVE mg/dL   Hgb urine dipstick NEGATIVE NEGATIVE   Bilirubin Urine NEGATIVE NEGATIVE   Ketones, ur NEGATIVE NEGATIVE mg/dL   Protein, ur >300 (A) NEGATIVE mg/dL   Nitrite NEGATIVE NEGATIVE   Leukocytes, UA SMALL (A) NEGATIVE  Urine microscopic-add on  Result Value Ref Range   Squamous Epithelial / LPF 0-5 (A) NONE SEEN   WBC, UA 6-30 0 - 5 WBC/hpf   RBC / HPF NONE SEEN 0 - 5 RBC/hpf   Bacteria, UA RARE (A) NONE SEEN  I-Stat CG4 Lactic Acid, ED  Result Value Ref Range   Lactic Acid, Venous 1.50 0.5 - 2.0 mmol/L      Assessment & Plan:   Problem List Items Addressed This Visit      Cardiovascular and Mediastinum   Essential hypertension    Her blood pressure is controlled on current medication.        Digestive   Nausea with vomiting    Nausea and vomiting has been mostly controlled and she is able to eat better now.        Endocrine   Type 2 diabetes mellitus with other circulatory complications (HCC) - Primary    Increase Humalog to 12 units 3 times a day with meals, keep Lantus at 38 units at bedtime.          Follow up plan: Return in  about 4 weeks (around 10/29/2015), or if symptoms worsen or fail to improve, for diabetes and htn.  Counseling provided for all of the vaccine components No orders of the defined types were placed in this encounter.    Caryl Pina, MD [facility] 10/01/2015, 11:46 AM

## 2015-10-01 NOTE — Assessment & Plan Note (Signed)
Nausea and vomiting has been mostly controlled and she is able to eat better now.

## 2015-10-28 ENCOUNTER — Encounter: Payer: Self-pay | Admitting: Family Medicine

## 2015-10-28 ENCOUNTER — Telehealth: Payer: Self-pay | Admitting: Family Medicine

## 2015-10-28 ENCOUNTER — Ambulatory Visit (INDEPENDENT_AMBULATORY_CARE_PROVIDER_SITE_OTHER): Payer: Medicare Other | Admitting: Family Medicine

## 2015-10-28 VITALS — BP 128/54 | HR 49 | Temp 97.4°F | Ht 61.0 in | Wt 166.0 lb

## 2015-10-28 DIAGNOSIS — I10 Essential (primary) hypertension: Secondary | ICD-10-CM

## 2015-10-28 DIAGNOSIS — I639 Cerebral infarction, unspecified: Secondary | ICD-10-CM

## 2015-10-28 DIAGNOSIS — I63312 Cerebral infarction due to thrombosis of left middle cerebral artery: Secondary | ICD-10-CM | POA: Diagnosis not present

## 2015-10-28 DIAGNOSIS — M2550 Pain in unspecified joint: Secondary | ICD-10-CM | POA: Diagnosis not present

## 2015-10-28 DIAGNOSIS — E1143 Type 2 diabetes mellitus with diabetic autonomic (poly)neuropathy: Secondary | ICD-10-CM | POA: Diagnosis not present

## 2015-10-28 DIAGNOSIS — E785 Hyperlipidemia, unspecified: Secondary | ICD-10-CM

## 2015-10-28 DIAGNOSIS — N184 Chronic kidney disease, stage 4 (severe): Secondary | ICD-10-CM

## 2015-10-28 DIAGNOSIS — E559 Vitamin D deficiency, unspecified: Secondary | ICD-10-CM | POA: Diagnosis not present

## 2015-10-28 DIAGNOSIS — E1159 Type 2 diabetes mellitus with other circulatory complications: Secondary | ICD-10-CM

## 2015-10-28 DIAGNOSIS — K3184 Gastroparesis: Secondary | ICD-10-CM

## 2015-10-28 NOTE — Patient Instructions (Addendum)
Medicare Annual Wellness Visit  [facility] and the medical providers at [facility] strive to bring you the best medical care.  In doing so we not only want to address your current medical conditions and concerns but also to detect new conditions early and prevent illness, disease and health-related problems.    Medicare offers a yearly Wellness Visit which allows our clinical staff to assess your need for preventative services including immunizations, lifestyle education, counseling to decrease risk of preventable diseases and screening for fall risk and other medical concerns.    This visit is provided free of charge (no copay) for all Medicare recipients. The clinical pharmacists at [facility] have begun to conduct these Wellness Visits which will also include a thorough review of all your medications.    As you primary medical provider recommend that you make an appointment for your Annual Wellness Visit if you have not done so already this year.  You may set up this appointment before you leave today or you may call back [phone] and schedule an appointment.  Please make sure when you call that you mention that you are scheduling your Annual Wellness Visit with the clinical pharmacist so that the appointment may be made for the proper length of time.     Continue current medications. Continue good therapeutic lifestyle changes which include good diet and exercise. Fall precautions discussed with patient. If an FOBT was given today- please return it to our front desk. If you are over 24 years old - you may need Prevnar 49 or the adult Pneumonia vaccine.  **Flu shots are available--- please call and schedule a FLU-CLINIC appointment**  After your visit with [name] today you will receive a survey in the mail or online from [employer] regarding your care with [name]. Please take a moment to fill this out. Your feedback is very  important to [name] as you can help [name] better understand your patient needs as well as improve your experience and satisfaction. WE CARE ABOUT YOU!!!    A prescription will be given for a hospital bed because the patient is currently having to sleep in a recliner and this has been add on her joints.  continue to exercise and watch diet and drink plenty of fluids and be careful not to fall  Continue follow-up with gastroenterology and nephrology as planned  Check feet daily  Monitor blood sugars closely

## 2015-10-28 NOTE — Progress Notes (Signed)
Subjective:    Patient ID: [name], female    DOB: Mar 17, 1936, 79 y.o.   MRN: [id number]  HPI Pt here for follow up and management of chronic medical problems which includes diabetes. She is taking medications regularly. The patient comes to the visit today with her husband in a wheelchair.  The patient is doing better with her nausea and vomiting.. She's had multiple visits to the hospital and to the gastroenterologist. She says the Reglan has helped considerably with this. She seems to be more active disease and more pleasant with her situation today with fewer complaints.She also has renal dysfunction from her kidneys and diabetes and she is followed by the nephrologist periodically for this. She also complains of arthralgias. She is hoping that a hospital bed to allow for adjustments can help her symptoms with the arthralgias. She denies any chest pain shortness of breath heartburn nausea and vomiting diarrhea or blood in the stool today. She is passing her water without problems. She seems to be in much better spirits and in the past.      Patient Active Problem List   Diagnosis Date Noted  . Cerebrovascular accident (CVA) due to thrombosis of left middle cerebral artery ([place]) 09/03/2015  . Ischemic stroke ([place]) 08/15/2015  . Type 2 diabetes mellitus with other circulatory complications ([place])   . Nausea with vomiting   . Malnutrition of moderate degree ([place]) 07/15/2015  . Gastroparesis due to DM ([place]) 01/16/2015  . Gastric polyps 10/11/2014  . MGUS (monoclonal gammopathy of unknown significance) 10/10/2014  . Proteinuria 09/23/2014  . Vitamin D insufficiency 03/29/2013  . Chronic kidney disease, stage IV (severe) ([place]) 09/14/2011  . CVA (cerebral infarction), past history 09/14/2011  . 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 HYPERLIPIDEMIA 08/12/2008  . Essential hypertension 08/12/2008  . GERD 08/12/2008  . OSTEOARTHRITIS, lumbar spine 08/12/2008  .  Osteoporosis, postmenopausal 08/12/2008   Outpatient Encounter Prescriptions as of 10/28/2015  Medication Sig  . acetaminophen (TYLENOL) 500 MG tablet Take 1,000 mg by mouth every 6 (six) hours as needed for moderate pain.   Marland Kitchen amLODipine (NORVASC) 10 MG tablet TAKE 1 TABLET ONCE A DAY (Patient taking differently: TAKE ONE-HALF OF A TABLET ONCE A DAY)  . clopidogrel (PLAVIX) 75 MG tablet Take 1 tablet (75 mg total) by mouth daily.  Marland Kitchen glucagon (GLUCAGON EMERGENCY) 1 MG injection Inject into the muscle as needed for severe low BG  . Insulin Glargine (LANTUS SOLOSTAR) 100 UNIT/ML Solostar Pen Inject 30 Units into the skin every evening. (Patient taking differently: Inject 38 Units into the skin every evening. )  . insulin lispro (HUMALOG KWIKPEN) 100 UNIT/ML KiwkPen Inject 0.05-0.1 mLs (5-10 Units total) into the skin 3 (three) times daily. (Patient taking differently: Inject 10 Units into the skin 3 (three) times daily. )  . Insulin Pen Needle (PEN NEEDLES 31GX5/16") 31G X 8 MM MISC USE AS DIRECTED TWICE A DAY (Patient taking differently: 1 each by Other route 3 (three) times daily. USE AS DIRECTED TWICE A DAY)  . Lancets (ONETOUCH ULTRASOFT) lancets CHECK BLOOD SUGAR 4 TIMES A DAY  . levothyroxine (SYNTHROID, LEVOTHROID) 50 MCG tablet TAKE 1 TABLET DAILY  . metoCLOPramide (REGLAN) 5 MG tablet Take 5 mg by mouth 3 (three) times daily before meals.   . metoprolol (LOPRESSOR) 50 MG tablet Take 50 mg by mouth 2 (two) times daily.  . metoprolol tartrate (LOPRESSOR) 25 MG tablet TAKE 1 & 1/2 TABLETS TWICE A DAY  . mupirocin  ointment (BACTROBAN) 2 % Apply 1 application topically 2 (two) times daily. (Patient taking differently: Apply 1 application topically 2 (two) times daily as needed (FOR RASH). )  . omega-3 acid ethyl esters (LOVAZA) 1 G capsule TAKE (2) CAPSULES TWICE DAILY.  Marland Kitchen ondansetron (ZOFRAN) 8 MG tablet Take 8 mg by mouth every 8 (eight) hours as needed for nausea.   . ONE TOUCH ULTRA TEST test  strip Test blood sugar up to 5 times per day prn. DX E11.8  . pantoprazole (PROTONIX) 40 MG tablet TAKE (1) TABLET TWICE A DAY.  Vladimir Faster Glycol-Propyl Glycol 0.4-0.3 % SOLN Apply 1 drop to eye 3 (three) times daily as needed (dry eyes).  . pravastatin (PRAVACHOL) 20 MG tablet Take 1 tablet (20 mg total) by mouth daily at 6 PM.  . traMADol (ULTRAM) 50 MG tablet Take 1 tablet (50 mg total) by mouth every 6 (six) hours as needed. (Patient taking differently: Take 50 mg by mouth every 6 (six) hours as needed for moderate pain. )  . [DISCONTINUED] cephALEXin (KEFLEX) 500 MG capsule Take 1 capsule (500 mg total) by mouth 2 (two) times daily.   No facility-administered encounter medications on file as of 10/28/2015.      Review of Systems  Constitutional: Negative.   HENT: Negative.   Eyes: Negative.   Respiratory: Negative.   Cardiovascular: Negative.   Gastrointestinal: Negative.   Endocrine: Negative.   Genitourinary: Negative.   Musculoskeletal: Positive for arthralgias (cant get comfortable. ).  Skin: Negative.   Allergic/Immunologic: Negative.   Neurological: Negative.   Hematological: Negative.   Psychiatric/Behavioral: Negative.        Objective:   Physical Exam  Constitutional: She is oriented to person, place, and time. She appears well-nourished.  Elderly pleasant alert and in a wheelchair  HENT:  Head: Normocephalic and atraumatic.  Right Ear: External ear normal.  Left Ear: External ear normal.  Nose: Nose normal.  Mouth/Throat: Oropharynx is clear and moist.  Eyes: Conjunctivae and EOM are normal. Pupils are equal, round, and reactive to light. Right eye exhibits no discharge. Left eye exhibits no discharge. No scleral icterus.  Neck: Normal range of motion. Neck supple. No thyromegaly present.  Cardiovascular: Normal rate, regular rhythm, normal heart sounds and intact distal pulses.   No murmur heard. At 60/m with a regular rate and rhythm  Pulmonary/Chest:  Effort normal and breath sounds normal. No respiratory distress. She has no wheezes. She has no rales. She exhibits no tenderness.  Clear anteriorly and posteriorly  Abdominal: Soft. Bowel sounds are normal. She exhibits no mass. There is no tenderness. There is no rebound and no guarding.  No tenderness and ileostomy appears to be functioning normally  Musculoskeletal: Normal range of motion. She exhibits no edema.  Lymphadenopathy:    She has no cervical adenopathy.  Neurological: She is alert and oriented to person, place, and time. She has normal reflexes.  Skin: Skin is warm and dry. No rash noted.  Psychiatric: She has a normal mood and affect. Her behavior is normal. Judgment and thought content normal.  Nursing note and vitals reviewed.  BP 128/54 mmHg  Pulse 49  Temp(Src) 97.4 F (36.3 C) (Oral)  Ht 5' 1" (1.549 m)  Wt 166 lb (75.297 kg)  BMI 31.38 kg/m2        Assessment & Plan:  1. Type 2 diabetes mellitus with other circulatory complications (HCC) -The hemoglobin A1c is pending and according to the husband the blood sugars have  been under better control at home. - POCT glycosylated hemoglobin (Hb A1C); Future - BMP8+EGFR; Future - CBC with Differential/Platelet; Future  2. Essential hypertension -The blood pressure is under good control today she will continue with current treatment - BMP8+EGFR; Future - CBC with Differential/Platelet; Future  3. HLD (hyperlipidemia) -Continue with current treatment pending results of lab work - CBC with Differential/Platelet; Future - Hepatic function panel; Future - NMR, lipoprofile; Future  4. Vitamin D insufficiency -Continue with current treatment pending results of future lab work - CBC with Differential/Platelet; Future - VITAMIN D 25 Hydroxy (Vit-D Deficiency, Fractures); Future  5. Chronic kidney disease, stage IV (severe) (HCC) -Continue follow-up with nephrologist - BMP8+EGFR; Future - CBC with  Differential/Platelet; Future  6. Cerebrovascular accident (CVA) due to thrombosis of left middle cerebral artery ([place]) -Continue current treatment and follow-up with neurologist as needed - CBC with Differential/Platelet; Future - DME Hospital bed  7. Gastroparesis due to DM [facility]) -This seems to be doing better with taking Reglan. - CBC with Differential/Platelet; Future - DME Hospital bed  8. Ischemic stroke ([place]) -Continue follow-up with neurology -Also prescription for a hospital bed was given to the patient to help her position her body better and to hurt less  Patient Instructions                       Medicare Annual Wellness Visit  [facility] and the medical providers at [facility] strive to bring you the best medical care.  In doing so we not only want to address your current medical conditions and concerns but also to detect new conditions early and prevent illness, disease and health-related problems.    Medicare offers a yearly Wellness Visit which allows our clinical staff to assess your need for preventative services including immunizations, lifestyle education, counseling to decrease risk of preventable diseases and screening for fall risk and other medical concerns.    This visit is provided free of charge (no copay) for all Medicare recipients. The clinical pharmacists at [facility] have begun to conduct these Wellness Visits which will also include a thorough review of all your medications.    As you primary medical provider recommend that you make an appointment for your Annual Wellness Visit if you have not done so already this year.  You may set up this appointment before you leave today or you may call back ([phone]) and schedule an appointment.  Please make sure when you call that you mention that you are scheduling your Annual Wellness Visit with the clinical pharmacist so that the appointment may be made for the  proper length of time.     Continue current medications. Continue good therapeutic lifestyle changes which include good diet and exercise. Fall precautions discussed with patient. If an FOBT was given today- please return it to our front desk. If you are over 69 years old - you may need Prevnar 50 or the adult Pneumonia vaccine.  **Flu shots are available--- please call and schedule a FLU-CLINIC appointment**  After your visit with [name] today you will receive a survey in the mail or online from [employer] regarding your care with [name]. Please take a moment to fill this out. Your feedback is very important to [name] as you can help [name] better understand your patient needs as well as improve your experience and satisfaction. WE CARE ABOUT YOU!!!    A prescription will be given for a hospital bed  because the patient is currently having to sleep in a recliner and this has been add on her joints.  continue to exercise and watch diet and drink plenty of fluids and be careful not to fall  Continue follow-up with gastroenterology and nephrology as planned  Check feet daily  Monitor blood sugars closely   Arrie Senate MD  - CBC with Differential/Platelet; Future - DME Hospital bed  9. Arthralgia -This prescription was given for a hospital bed to help with the arthralgias that she is experiencing while sleeping in a recliner. - DME Hospital bed  Arrie Senate MD

## 2015-10-28 NOTE — Addendum Note (Signed)
Addended by: Zannie Cove on: 10/28/2015 02:40 PM   Modules accepted: Orders

## 2015-10-28 NOTE — Telephone Encounter (Signed)
Order, demos, and ov notes faxed to [facility]

## 2015-10-31 ENCOUNTER — Other Ambulatory Visit: Payer: Self-pay | Admitting: Internal Medicine

## 2015-11-14 ENCOUNTER — Encounter: Payer: Self-pay | Admitting: Family Medicine

## 2015-11-14 NOTE — Telephone Encounter (Signed)
Can you please review this for me. Thank you.

## 2015-11-18 ENCOUNTER — Encounter: Payer: Self-pay | Admitting: Family Medicine

## 2015-11-19 ENCOUNTER — Encounter: Payer: Self-pay | Admitting: Family Medicine

## 2015-11-19 ENCOUNTER — Other Ambulatory Visit (INDEPENDENT_AMBULATORY_CARE_PROVIDER_SITE_OTHER): Payer: Medicare Other

## 2015-11-19 DIAGNOSIS — R300 Dysuria: Secondary | ICD-10-CM

## 2015-11-19 LAB — POCT URINALYSIS DIPSTICK
Bilirubin, UA: NEGATIVE
Blood, UA: NEGATIVE
Glucose, UA: NEGATIVE
KETONES UA: NEGATIVE
Nitrite, UA: NEGATIVE
Spec Grav, UA: 1.025
Urobilinogen, UA: NEGATIVE
pH, UA: 5

## 2015-11-19 LAB — POCT UA - MICROSCOPIC ONLY
Bacteria, U Microscopic: NEGATIVE
Casts, Ur, LPF, POC: NEGATIVE
Crystals, Ur, HPF, POC: NEGATIVE
MUCUS UA: NEGATIVE
RBC, URINE, MICROSCOPIC: NEGATIVE
Yeast, UA: NEGATIVE

## 2015-11-21 ENCOUNTER — Encounter: Payer: Self-pay | Admitting: Family Medicine

## 2015-11-21 LAB — URINE CULTURE

## 2015-11-24 MED ORDER — DOXYCYCLINE HYCLATE 100 MG PO TABS: 100.0000 mg | ORAL_TABLET | Freq: Two times a day (BID) | ORAL | Status: DC

## 2015-11-24 NOTE — Telephone Encounter (Signed)
rx sent into pharmacy and message sent back on mychart.

## 2015-11-27 ENCOUNTER — Encounter: Payer: Self-pay | Admitting: Family Medicine

## 2015-12-01 ENCOUNTER — Other Ambulatory Visit: Payer: Self-pay | Admitting: Family Medicine

## 2015-12-02 ENCOUNTER — Telehealth: Payer: Self-pay | Admitting: Family Medicine

## 2015-12-03 ENCOUNTER — Other Ambulatory Visit (INDEPENDENT_AMBULATORY_CARE_PROVIDER_SITE_OTHER): Payer: Medicare Other

## 2015-12-03 DIAGNOSIS — E559 Vitamin D deficiency, unspecified: Secondary | ICD-10-CM

## 2015-12-03 DIAGNOSIS — D472 Monoclonal gammopathy: Secondary | ICD-10-CM

## 2015-12-03 LAB — POCT GLYCOSYLATED HEMOGLOBIN (HGB A1C): HEMOGLOBIN A1C: 6.7

## 2015-12-04 LAB — BMP8+EGFR
BUN / CREAT RATIO: 11 (ref 11–26)
BUN: 32 mg/dL — ABNORMAL HIGH (ref 8–27)
CHLORIDE: 104 mmol/L (ref 96–106)
CO2: 15 mmol/L — ABNORMAL LOW (ref 18–29)
CREATININE: 2.99 mg/dL — AB (ref 0.57–1.00)
Calcium: 9.1 mg/dL (ref 8.7–10.3)
GFR calc Af Amer: 17 mL/min/{1.73_m2} — ABNORMAL LOW (ref >59)
GFR calc non Af Amer: 14 mL/min/{1.73_m2} — ABNORMAL LOW (ref >59)
GLUCOSE: 230 mg/dL — AB (ref 65–99)
POTASSIUM: 4.9 mmol/L (ref 3.5–5.2)
SODIUM: 137 mmol/L (ref 134–144)

## 2015-12-04 LAB — CBC WITH DIFFERENTIAL/PLATELET
Basophils Absolute: 0 10*3/uL (ref 0.0–0.2)
Basos: 1 %
EOS (ABSOLUTE): 0.3 10*3/uL (ref 0.0–0.4)
EOS: 5 %
HEMATOCRIT: 35 % (ref 34.0–46.6)
Hemoglobin: 11.4 g/dL (ref 11.1–15.9)
Immature Grans (Abs): 0 10*3/uL (ref 0.0–0.1)
Immature Granulocytes: 0 %
LYMPHS ABS: 1.4 10*3/uL (ref 0.7–3.1)
Lymphs: 23 %
MCH: 31.8 pg (ref 26.6–33.0)
MCHC: 32.6 g/dL (ref 31.5–35.7)
MCV: 98 fL — ABNORMAL HIGH (ref 79–97)
MONOS ABS: 0.5 10*3/uL (ref 0.1–0.9)
Monocytes: 8 %
Neutrophils Absolute: 3.8 10*3/uL (ref 1.4–7.0)
Neutrophils: 63 %
Platelets: 192 10*3/uL (ref 150–379)
RBC: 3.58 x10E6/uL — AB (ref 3.77–5.28)
RDW: 14.1 % (ref 12.3–15.4)
WBC: 6 10*3/uL (ref 3.4–10.8)

## 2015-12-04 LAB — NMR, LIPOPROFILE
CHOLESTEROL: 159 mg/dL (ref 100–199)
HDL Cholesterol by NMR: 45 mg/dL (ref >39)
HDL PARTICLE NUMBER: 27.2 umol/L — AB (ref >=30.5)
LDL Particle Number: 374 nmol/L (ref <1000)
LDL-C: 58 mg/dL (ref 0–99)
LP-IR SCORE: 49 — AB (ref <=45)
Triglycerides by NMR: 280 mg/dL — ABNORMAL HIGH (ref 0–149)

## 2015-12-04 LAB — VITAMIN D 25 HYDROXY (VIT D DEFICIENCY, FRACTURES): VIT D 25 HYDROXY: 16.1 ng/mL — AB (ref 30.0–100.0)

## 2015-12-04 LAB — HEPATIC FUNCTION PANEL
ALBUMIN: 3.6 g/dL (ref 3.5–4.8)
ALK PHOS: 203 IU/L — AB (ref 39–117)
ALT: 32 IU/L (ref 0–32)
AST: 40 IU/L (ref 0–40)
BILIRUBIN, DIRECT: 0.09 mg/dL (ref 0.00–0.40)
Bilirubin Total: 0.2 mg/dL (ref 0.0–1.2)
Total Protein: 6.1 g/dL (ref 6.0–8.5)

## 2015-12-06 ENCOUNTER — Other Ambulatory Visit: Payer: Self-pay | Admitting: Internal Medicine

## 2015-12-09 ENCOUNTER — Encounter: Payer: Self-pay | Admitting: Neurology

## 2015-12-09 ENCOUNTER — Ambulatory Visit (INDEPENDENT_AMBULATORY_CARE_PROVIDER_SITE_OTHER): Payer: Medicare Other | Admitting: Neurology

## 2015-12-09 VITALS — BP 144/64 | HR 77 | Ht 61.0 in | Wt 165.2 lb

## 2015-12-09 DIAGNOSIS — N289 Disorder of kidney and ureter, unspecified: Secondary | ICD-10-CM | POA: Diagnosis not present

## 2015-12-09 DIAGNOSIS — E1159 Type 2 diabetes mellitus with other circulatory complications: Secondary | ICD-10-CM

## 2015-12-09 DIAGNOSIS — E785 Hyperlipidemia, unspecified: Secondary | ICD-10-CM

## 2015-12-09 DIAGNOSIS — I63312 Cerebral infarction due to thrombosis of left middle cerebral artery: Secondary | ICD-10-CM | POA: Diagnosis not present

## 2015-12-09 DIAGNOSIS — I10 Essential (primary) hypertension: Secondary | ICD-10-CM

## 2015-12-09 NOTE — Patient Instructions (Addendum)
-   continue plavix and pravastatin for stroke prevention - check BP and glucose at home - Avoid low BP and dehydration - Follow up with your primary care physician for stroke risk factor modification. Recommend maintain blood pressure goal <130/80, diabetes with hemoglobin A1c goal below 6.5% and lipids with LDL cholesterol goal below 70 mg/dL.  - diabetic diet and home exercise - will order home PT/OT to make you stronger - follow up in 6 months

## 2015-12-09 NOTE — Progress Notes (Signed)
STROKE NEUROLOGY FOLLOW UP NOTE  NAME: [name] DOB: 03-11-1936  REASON FOR VISIT: stroke follow up HISTORY FROM: husband and chart  Today we had the pleasure of seeing [name] in follow-up at our Neurology Clinic. Pt was accompanied by husband.   History Summary Ms. [name] is a 81 y.o. female with history of diabetes mellitus, hyperlipidemia, hypertension, previous stroke, chronic kidney disease, gastroparesis was admitted for intractable N/V. During the hospitalization, she developed confusion with mil word finding difficulties, and decreased level of responsiveness.MRI show multifocal acute left frontal MCA/ACA border zone punctate infarcts. Etiology could be due to atherosclerosis due to multiple uncontrolled stroke risk factors vs. Hypotensive events due to N/V, dehydration with watershed infarcts. MRA, CUS, TTE, DVT screening, MRV all unremarkable. However, her LDL 246 and A1C 6.9. She was put on plavix and pravastatin and recommend aggressive risk factor control.   09/03/15 follow up - the patient has been doing better. Repeat LDL was 74. However, as per husband, her risk factor still not in good control. BP running 170s at home some time, and glucose ranging from 250 to 450. Her humlog and lantus dose have been increased. Today her BP 150/66. However, pt today in clinic feeling N/V, lethargic, and restless, and requested to finish off the clinic in a hurry.   Interval History During the interval time,  The pt has been doing better, no N/V during the visit. Husband said she is taking reglan daily for her diabetic gastroparesis now. Husband stated her blood pressure and glucose are in good control. BP 144/64 today in clinic and her insulin was increased for better DM control. Patient still feels generalized weakness, and not having any home PT OT at this moment.  REVIEW OF SYSTEMS: Full 14 system review of systems performed and notable only for those listed below  and in HPI above, all others are negative:  Constitutional:   Cardiovascular:  Ear/Nose/Throat:   Skin:  Eyes:   Respiratory:   Gastroitestinal:   Genitourinary:  Hematology/Lymphatic:   Endocrine:  Musculoskeletal:   Allergy/Immunology:   Neurological:   Psychiatric:  Sleep:   The following represents the patient's updated allergies and side effects list: Allergies  Allergen Reactions  . Ace Inhibitors Other (See Comments)    unknown  . Actos [Pioglitazone Hydrochloride] Other (See Comments)    Jitters   . Aspirin     REACTION: GI upset, STOMACH POLYPS  . Codeine Nausea Only    REACTION: hallucinations  . Hydromorphone Hcl Nausea And Vomiting  . Metformin And Related Nausea And Vomiting  . Penicillins Other (See Comments)    REACTION: "crazy feeling", LIGHT HEADED , DIZINESS  . Procaine Hcl Other (See Comments)    Pt goes crazy   . Statins Other (See Comments)    myalgia  . Sulfa Antibiotics Other (See Comments)    UNSURE , MOST LIKELY DIZINESS    The neurologically relevant items on the patient's problem list were reviewed on today's visit.  Neurologic Examination  A problem focused neurological exam (12 or more points of the single system neurologic examination, vital signs counts as 1 point, cranial nerves count for 8 points) was performed.  Blood pressure 144/64, pulse 77, height 5\' 1"  (1.549 m), weight 165 lb 3.2 oz (74.934 kg).  General - Well nourished, well developed, in no acute distress.  Ophthalmologic - Fundi not visualized due to noncooperation.  Cardiovascular - Regular rate and rhythm.  Mental Status -  Level of arousal and orientation to place, time and person were intact. Language including expression, comprehension, naming and repetition are intact, but mild dysarthria.  Cranial Nerves II - XII - II - blinking to visual threat bilaterally. III, IV, VI - Extraocular movements intact. V - Facial sensation intact bilaterally. VII - Facial  symmetric. VIII - Hearing & vestibular intact bilaterally, mild dysarthria. X - not cooperative. XI - not cooperative. XII - Tongue protrusion intact.  Motor Strength - The patient's strength was 4/5 in all extremities and pronator drift was absent.  Bulk was normal and fasciculations were absent.   Motor Tone - Muscle tone was assessed at the neck and appendages and was normal.  Reflexes - The patient's reflexes were 1+ in all extremities and she had no pathological reflexes.  Sensory - Light touch, temperature/pinprick were assessed and were normal.    Coordination - not cooperative on exam.  Tremor was absent.  Gait and Station - in wheelchair, gait not tested.  Data reviewed: I personally reviewed the images and agree with the radiology interpretations.  Ct Head Wo Contrast 07/16/2015  Chronic changes in the brain but no acute findings.   Mr Brain Wo Contrast 07/17/2015  Multifocal areas of non hemorrhagic acute infarction affect the LEFT frontal subcortical white matter. Advanced chronic microvascular ischemic change with generalized moderate atrophy. Areas of chronic ischemia as described, most notable being the LEFT thalamic hemorrhagic lacunar infarct.   LE venous Doppler - negative for DVT  Carotid Doppler - Bilateral: 1-39% ICA stenosis. Vertebral artery flow is antegrade.  2-D echo - - Left ventricle: The cavity size was mildly dilated. There was moderate concentric hypertrophy. Systolic function was normal. The estimated ejection fraction was in the range of 55% to 60%. Wall motion was normal; there were no regional wall motion abnormalities. - Aortic valve: There was trivial regurgitation. - Left atrium: The atrium was mildly dilated. - Atrial septum: No defect or patent foramen ovale was identified.  MRA   Motion degraded study. No major vessel occlusion. Cannot rule out intracranial branch vessel stenotic disease.  There appears to be  diminished flow in the left vertebral artery when compared to the study of 2010. This could be partially artifactual as well however.  Likely artifactual appearance of stenosis of the distal basilar. In evaluating the source images, I think it is less likely that there is a true distal basilar stenosis.  MRV  No evidence of venous thrombosis. Very diminutive left transverse sinus as demonstrated on previous studies. I do not believe there is any venous thrombosis.  Component     Latest Ref Rng 06/10/2015 07/14/2015 07/15/2015 07/19/2015  Cholesterol, Total     100 - 199 mg/dL 380 (H)     Triglycerides     <150 mg/dL 384 (H)   202 (H)  HDL Cholesterol     >40 mg/dL 57   55  VLDL Cholesterol Cal     5 - 40 mg/dL 77 (H)     LDL (calc)     0 - 99 mg/dL 246 (H)   74  Total CHOL/HDL Ratio      6.7 (H)   3.1  Cholesterol     0 - 200 mg/dL    169  VLDL     0 - 40 mg/dL    40  Hemoglobin A1C     4.8 - 5.6 %  6.9 (H)    Mean Plasma Glucose  151    TSH     0.350 - 4.500 uIU/mL   1.706     Assessment: As you may recall, she is a 80 y.o. Caucasian female with PMH of DM, HLD, HTN, CKD, diabetic gastroparesis, previous stroke was admitted on 07/14/15 for intractable N/V. During the hospitalization, she developed confusion with mil word finding difficulties, and decreased level of responsiveness.MRI show multifocal acute left frontal MCA/ACA border zone punctate infarcts. Etiology could be due to atherosclerosis with multiple uncontrolled stroke risk factors vs. Hypotensive events due to N/V, dehydration with watershed infarcts. MRA, CUS, TTE, DVT screening, MRV all unremarkable. However, her LDL 246 and A1C 6.9. She was put on plavix and pravastatin and recommend aggressive risk factor control. However, during the interval time, her BP and glucose are better controlled. HDL better and repeat LDL was 74.   Plan:  - continue plavix and pravastatin for stroke prevention - check BP and  glucose at home - Avoid low BP and dehydration - Follow up with your primary care physician for stroke risk factor modification. Recommend maintain blood pressure goal <130/80, diabetes with hemoglobin A1c goal below 6.5% and lipids with LDL cholesterol goal below 70 mg/dL.  - diabetic diet and home exercise - home PT/OT - follow up in 6 months  No orders of the defined types were placed in this encounter.    No orders of the defined types were placed in this encounter.    Patient Instructions  - continue plavix and pravastatin for stroke prevention - check BP and glucose at home - Avoid low BP and dehydration - Follow up with your primary care physician for stroke risk factor modification. Recommend maintain blood pressure goal <130/80, diabetes with hemoglobin A1c goal below 6.5% and lipids with LDL cholesterol goal below 70 mg/dL.  - diabetic diet and home exercise - will order home PT/OT to make you stronger - follow up in 6 months    Rosalin Hawking, MD PhD [facility] [address] [place], [place] [address] [phone]

## 2015-12-11 ENCOUNTER — Telehealth: Payer: Self-pay | Admitting: Neurology

## 2015-12-11 NOTE — Telephone Encounter (Signed)
[facility] [phone] called to get verbal order for approval PT 2 x week for 4 weeks for strengthening and activity tolerance and balance, patient refuses OT that had been ordered by Dr. Erlinda Hong.

## 2015-12-11 NOTE — Telephone Encounter (Signed)
Rn stated verbal order for PT with patient. Rn talk to Allison(therapist). Paperwork will be sent over to sign by Dr.Xu

## 2015-12-17 ENCOUNTER — Other Ambulatory Visit: Payer: Self-pay | Admitting: Family Medicine

## 2015-12-17 NOTE — Telephone Encounter (Signed)
Dr Laurance Flatten - please approve  - sign and will go to pharm.

## 2015-12-25 ENCOUNTER — Ambulatory Visit (INDEPENDENT_AMBULATORY_CARE_PROVIDER_SITE_OTHER): Payer: Medicare Other | Admitting: Family Medicine

## 2015-12-25 ENCOUNTER — Encounter: Payer: Self-pay | Admitting: Family Medicine

## 2015-12-25 VITALS — BP 169/58 | HR 50 | Temp 97.7°F

## 2015-12-25 DIAGNOSIS — I10 Essential (primary) hypertension: Secondary | ICD-10-CM | POA: Diagnosis not present

## 2015-12-25 DIAGNOSIS — B372 Candidiasis of skin and nail: Secondary | ICD-10-CM | POA: Diagnosis not present

## 2015-12-25 MED ORDER — INSULIN GLARGINE 100 UNIT/ML SOLOSTAR PEN: 38.0000 [IU] | PEN_INJECTOR | Freq: Every evening | SUBCUTANEOUS | Status: DC

## 2015-12-25 MED ORDER — CLOTRIMAZOLE 1 % EX CREA: 1.0000 "application " | TOPICAL_CREAM | Freq: Two times a day (BID) | CUTANEOUS | Status: AC

## 2015-12-25 MED ORDER — FLUCONAZOLE 150 MG PO TABS: 150.0000 mg | ORAL_TABLET | Freq: Once | ORAL | Status: DC

## 2015-12-25 NOTE — Assessment & Plan Note (Signed)
Patient has been having isolated systolic hypertension and diastolic hypotension. Decrease metoprolol to 12.5 twice a day and increase amlodipine to 10 mg daily

## 2015-12-25 NOTE — Progress Notes (Signed)
BP 169/58 mmHg  Pulse 50  Temp(Src) 97.7 F (36.5 C) (Oral)  Ht   Wt    Subjective:    Patient ID: [name], female    DOB: 05/23/1936, 80 y.o.   MRN: [id number]  HPI: [name] is a 80 y.o. female presenting on 12/25/2015 for Blood pressure has been elevated recently   HPI Elevated blood pressure  Patient comes in today because she's been having elevated blood pressures. 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 She denies any lightheadedness or dizziness or chest pain. There were concerned because they can't do the physical therapy when her blood pressure is elevated. She is currently on amlodipine 5 mg and metoprolol 25 twice a day. These medications were recently adjusted to try and help her blood pressure. The adjustments have not helped as much.  Rash Patient comes in today with a rash is worse in her vaginal region but is also under the folds of her abdomen that it's irritating and pink with satellite lesions. She has been using topical mupirocin for this.  Relevant past medical, surgical, family and social history reviewed and updated as indicated. Interim medical history since our last visit reviewed. Allergies and medications reviewed and updated.  Review of Systems  Constitutional: Negative for fever and chills.  HENT: Negative for congestion, ear discharge and ear pain.   Eyes: Negative for redness and visual disturbance.  Respiratory: Negative for chest tightness and shortness of breath.   Cardiovascular: Negative for chest pain and leg swelling.  Genitourinary: Negative for dysuria and difficulty urinating.  Musculoskeletal: Negative for back pain and gait problem.  Skin: Positive for rash.  Neurological: Negative for light-headedness and headaches.  Psychiatric/Behavioral: Negative for behavioral problems and agitation.  All other systems  reviewed and are negative.   Per HPI unless specifically indicated above     Medication List       This list is accurate as of: 12/25/15 12:18 PM.  Always use your most recent med list.               acetaminophen 500 MG tablet  Commonly known as:  TYLENOL  Take 1,000 mg by mouth every 6 (six) hours as needed for moderate pain.     amLODipine 10 MG tablet  Commonly known as:  NORVASC  TAKE 1 TABLET ONCE A DAY     clopidogrel 75 MG tablet  Commonly known as:  PLAVIX  Take 1 tablet (75 mg total) by mouth daily.     clotrimazole 1 % cream  Commonly known as:  LOTRIMIN  Apply 1 application topically 2 (two) times daily.     doxycycline 100 MG tablet  Commonly known as:  VIBRA-TABS  Take 1 tablet (100 mg total) by mouth 2 (two) times daily.     fluconazole 150 MG tablet  Commonly known as:  DIFLUCAN  Take 1 tablet (150 mg total) by mouth once.     glucagon 1 MG injection  Commonly known as:  GLUCAGON EMERGENCY  Inject into the muscle as needed for severe low BG     Insulin Glargine 100 UNIT/ML Solostar Pen  Commonly known as:  LANTUS SOLOSTAR  Inject 30 Units into the skin every evening.     insulin lispro 100 UNIT/ML KiwkPen  Commonly known as:  HUMALOG KWIKPEN  Inject 0.05-0.1 mLs (5-10 Units total) into the  skin 3 (three) times daily.     levothyroxine 50 MCG tablet  Commonly known as:  SYNTHROID, LEVOTHROID  TAKE 1 TABLET DAILY     metoCLOPramide 5 MG tablet  Commonly known as:  REGLAN  Take 1 tab by mouth in the morning, mid day and in the evening.     metoprolol tartrate 25 MG tablet  Commonly known as:  LOPRESSOR  TAKE 1 & 1/2 TABLETS TWICE A DAY     mupirocin ointment 2 %  Commonly known as:  BACTROBAN  Apply 1 application topically 2 (two) times daily.     omega-3 acid ethyl esters 1 g capsule  Commonly known as:  LOVAZA  TAKE (2) CAPSULES TWICE DAILY.     ondansetron 8 MG tablet  Commonly known as:  ZOFRAN  1/2 TO 1 TABLET 3 TIMES DAILY AS  NEEDED. FIRST DOSE EARLY BEFORE MEAL     ONE TOUCH ULTRA TEST test strip  Generic drug:  glucose blood  Test blood sugar up to 5 times per day prn. DX E11.8     onetouch ultrasoft lancets  CHECK BLOOD SUGAR 4 TIMES A DAY     pantoprazole 40 MG tablet  Commonly known as:  PROTONIX  TAKE (1) TABLET TWICE A DAY.     PEN NEEDLES 31GX5/16" 31G X 8 MM Misc  USE AS DIRECTED TWICE A DAY     Polyethyl Glycol-Propyl Glycol 0.4-0.3 % Soln  Apply 1 drop to eye 3 (three) times daily as needed (dry eyes).     pravastatin 20 MG tablet  Commonly known as:  PRAVACHOL  Take 1 tablet (20 mg total) by mouth daily at 6 PM.     traMADol 50 MG tablet  Commonly known as:  ULTRAM  Take 1 tablet (50 mg total) by mouth every 6 (six) hours as needed.           Objective:    BP 169/58 mmHg  Pulse 50  Temp(Src) 97.7 F (36.5 C) (Oral)  Ht   Wt   Wt Readings from Last 3 Encounters:  12/09/15 165 lb 3.2 oz (74.934 kg)  10/28/15 166 lb (75.297 kg)  09/03/15 170 lb 9.6 oz (77.384 kg)    Physical Exam  Constitutional: She is oriented to person, place, and time. She appears well-developed and well-nourished. No distress.  Eyes: Conjunctivae and EOM are normal. Pupils are equal, round, and reactive to light.  Cardiovascular: Normal rate, regular rhythm, normal heart sounds and intact distal pulses.   No murmur heard. Pulmonary/Chest: Effort normal and breath sounds normal. No respiratory distress. She has no wheezes.  Musculoskeletal: Normal range of motion. She exhibits no edema or tenderness.  Neurological: She is alert and oriented to person, place, and time. Coordination normal.  Skin: Skin is warm and dry. Rash (pink rash with satellite lesions, nonraised) noted. She is not diaphoretic.  Psychiatric: She has a normal mood and affect. Her behavior is normal.  Nursing note and vitals reviewed.   Results for orders placed or performed in visit on 12/03/15  BMP8+EGFR  Result Value Ref Range    Glucose 230 (H) 65 - 99 mg/dL   BUN 32 (H) 8 - 27 mg/dL   Creatinine, Ser 2.99 (H) 0.57 - 1.00 mg/dL   GFR calc non Af Amer 14 (L) >59 mL/min/1.73   GFR calc Af Amer 17 (L) >59 mL/min/1.73   BUN/Creatinine Ratio 11 11 - 26   Sodium 137 134 - 144 mmol/L     Potassium 4.9 3.5 - 5.2 mmol/L   Chloride 104 96 - 106 mmol/L   CO2 15 (L) 18 - 29 mmol/L   Calcium 9.1 8.7 - 10.3 mg/dL  CBC with Differential/Platelet  Result Value Ref Range   WBC 6.0 3.4 - 10.8 x10E3/uL   RBC 3.58 (L) 3.77 - 5.28 x10E6/uL   Hemoglobin 11.4 11.1 - 15.9 g/dL   Hematocrit 35.0 34.0 - 46.6 %   MCV 98 (H) 79 - 97 fL   MCH 31.8 26.6 - 33.0 pg   MCHC 32.6 31.5 - 35.7 g/dL   RDW 14.1 12.3 - 15.4 %   Platelets 192 150 - 379 x10E3/uL   Neutrophils 63 %   Lymphs 23 %   Monocytes 8 %   Eos 5 %   Basos 1 %   Neutrophils Absolute 3.8 1.4 - 7.0 x10E3/uL   Lymphocytes Absolute 1.4 0.7 - 3.1 x10E3/uL   Monocytes Absolute 0.5 0.1 - 0.9 x10E3/uL   EOS (ABSOLUTE) 0.3 0.0 - 0.4 x10E3/uL   Basophils Absolute 0.0 0.0 - 0.2 x10E3/uL   Immature Granulocytes 0 %   Immature Grans (Abs) 0.0 0.0 - 0.1 x10E3/uL  Hepatic function panel  Result Value Ref Range   Total Protein 6.1 6.0 - 8.5 g/dL   Albumin 3.6 3.5 - 4.8 g/dL   Bilirubin Total 0.2 0.0 - 1.2 mg/dL   Bilirubin, Direct 0.09 0.00 - 0.40 mg/dL   Alkaline Phosphatase 203 (H) 39 - 117 IU/L   AST 40 0 - 40 IU/L   ALT 32 0 - 32 IU/L  NMR, lipoprofile  Result Value Ref Range   LDL Particle Number 374 <1000 nmol/L   LDL-C 58 0 - 99 mg/dL   HDL Cholesterol by NMR 45 >39 mg/dL   Triglycerides by NMR 280 (H) 0 - 149 mg/dL   Cholesterol 159 100 - 199 mg/dL   HDL Particle Number 27.2 (L) >=30.5 umol/L   Small LDL Particle Number <90 <=527 nmol/L   LDL Size CANCELED nm   LP-IR Score 49 (H) <=45  VITAMIN D 25 Hydroxy (Vit-D Deficiency, Fractures)  Result Value Ref Range   Vit D, 25-Hydroxy 16.1 (L) 30.0 - 100.0 ng/mL  POCT glycosylated hemoglobin (Hb A1C)  Result Value Ref  Range   Hemoglobin A1C 6.7       Assessment & Plan:   Problem List Items Addressed This Visit      Cardiovascular and Mediastinum   Essential hypertension - Primary    Patient has been having isolated systolic hypertension and diastolic hypotension. Decrease metoprolol to 12.5 twice a day and increase amlodipine to 10 mg daily      Relevant Medications   metoprolol tartrate (LOPRESSOR) 25 MG tablet    Other Visit Diagnoses    Yeast dermatitis        Relevant Medications    fluconazole (DIFLUCAN) 150 MG tablet    clotrimazole (LOTRIMIN) 1 % cream        Follow up plan: Return in about 4 weeks (around 01/22/2016), or if symptoms worsen or fail to improve, for Hypertension recheck.  Counseling provided for all of the vaccine components No orders of the defined types were placed in this encounter.    Caryl Pina, MD [facility] 12/25/2015, 12:18 PM

## 2015-12-29 ENCOUNTER — Encounter: Payer: Self-pay | Admitting: Family Medicine

## 2015-12-30 ENCOUNTER — Encounter: Payer: Self-pay | Admitting: Internal Medicine

## 2015-12-30 ENCOUNTER — Ambulatory Visit (INDEPENDENT_AMBULATORY_CARE_PROVIDER_SITE_OTHER): Payer: Medicare Other | Admitting: Internal Medicine

## 2015-12-30 VITALS — BP 136/50 | HR 64 | Ht 61.0 in | Wt 166.0 lb

## 2015-12-30 DIAGNOSIS — K3184 Gastroparesis: Secondary | ICD-10-CM | POA: Diagnosis not present

## 2015-12-30 DIAGNOSIS — R110 Nausea: Secondary | ICD-10-CM

## 2015-12-30 DIAGNOSIS — K760 Fatty (change of) liver, not elsewhere classified: Secondary | ICD-10-CM | POA: Diagnosis not present

## 2015-12-30 MED ORDER — ONDANSETRON HCL 8 MG PO TABS: 4.0000 mg | ORAL_TABLET | Freq: Two times a day (BID) | ORAL | Status: DC

## 2015-12-30 NOTE — Progress Notes (Signed)
Subjective:    Patient ID: [name], female    DOB: 31-Mar-1936, 80 y.o.   MRN: [id number]  HPI [name] is a 80 year old female with a complex medical history who is followed by me for her history of GERD, gastroparesis, chronic nausea, elevated liver enzymes with fatty liver, history of diverticulitis status post colonic resection with end ileostomy in place who presents for follow-up. She's here today with her husband. Unfortunately she was hospitalized in September 2016 with severe nausea and vomiting and also had a stroke. She was hospitalized for greater than 1 week and spent 5 weeks in acute rehabilitation. She has been started on Plavix and has been working closely with primary care and neurology recently to control blood pressure. A GI perspective she has been using Reglan 5 mg 3 times daily and also Zofran 4-8 mg as needed for nausea. She is continued pantoprazole 40 mg once daily  During her hospitalization she had an upper endoscopy performed by Dr. Havery Moros on 07/16/2015. This showed a normal esophagus. Numerous gastric polyps some of which were erythematous. One polyp at the cardia was very red and inflamed it was resected by hot snare. Biopsies were taken to rule out H. pylori and the duodenum was normal in the examined area, other than irregularity of the ampulla which was biopsied. There is no evidence for gastric outlet obstruction. Pathology = benign fundic gland polyp, gastric biopsies negative for metaplasia or H. Pylori, benign duodenal mucosa.  From a GI perspective fortunately she has been feeling fairly well. She does have intermittent nausea which responds well to Zofran. She is using Reglan 5 mg 3 times daily with meals. Appetite has been good. Ostomy output is also been normal without melena or bleeding. She has not had issues with abdominal pain. No recent falls. Her husband notes she does sleep frequency throughout the day and night. He denies any confusion  recently related to her mental status.   Review of Systems As per history of present illness, otherwise negative  Current Medications, Allergies, Past Medical History, Past Surgical History, Family History and Social History were reviewed in [employer] record.     Objective:   Physical Exam BP 136/50 mmHg  Pulse 64  Ht 5\' 1"  (1.549 m)  Wt 166 lb (75.297 kg)  BMI 31.38 kg/m2 Constitutional: Chronically ill-appearing female in no acute distress  HEENT: Normocephalic and atraumatic. Conjunctivae are normal.  No scleral icterus. Neck: Neck supple. Trachea midline. Cardiovascular: Normal rate, regular rhythm and intact distal pulses.  Pulmonary/chest: Effort normal and breath sounds normal. No wheezing, rales or rhonchi. Abdominal: Soft, nontender, nondistended. Bowel sounds active throughout. Multiple well-healed abdominal scars with ostomy in the left middle quadrant draining greenish brown stool  Extremities: no clubbing, cyanosis, or edema Neurological: Alert and oriented to person place and time. Skin: Skin is warm and dry. Psychiatric: Normal mood and affect. Behavior is normal.  CBC    Component Value Date/Time   WBC 6.0 12/03/2015 0928   WBC 6.5 09/28/2015 1237   WBC 7.0 08/04/2015 1000   WBC 9.5 03/20/2015 1517   RBC 3.58* 12/03/2015 0928   RBC 3.59* 09/28/2015 1237   RBC 3.74 08/04/2015 1000   RBC 4.00* 03/20/2015 1517   HGB 11.4* 09/28/2015 1237   HGB 11.8 08/04/2015 1000   HGB 11.8* 03/20/2015 1517   HCT 35.0 12/03/2015 0928   HCT 35.0* 09/28/2015 1237   HCT 35.8 08/04/2015 1000   HCT 37.5* 03/20/2015 1517  PLT 192 12/03/2015 0928   PLT 221 09/28/2015 1237   PLT 220 08/04/2015 1000   MCV 98* 12/03/2015 0928   MCV 97.5 09/28/2015 1237   MCV 95.5 08/04/2015 1000   MCV 93.7 03/20/2015 1517   MCH 31.8 12/03/2015 0928   MCH 31.8 09/28/2015 1237   MCH 31.6 08/04/2015 1000   MCH 29.5 03/20/2015 1517   MCHC 32.6 12/03/2015 0928   MCHC  32.6 09/28/2015 1237   MCHC 33.1 08/04/2015 1000   MCHC 31.5* 03/20/2015 1517   RDW 14.1 12/03/2015 0928   RDW 14.0 09/28/2015 1237   RDW 14.3 08/04/2015 1000   LYMPHSABS 1.4 12/03/2015 0928   LYMPHSABS 1.2 08/25/2015 1457   LYMPHSABS 1.3 08/04/2015 1000   MONOABS 0.5 08/25/2015 1457   MONOABS 0.8 08/04/2015 1000   EOSABS 0.3 12/03/2015 0928   EOSABS 0.2 08/25/2015 1457   EOSABS 0.2 08/04/2015 1000   BASOSABS 0.0 12/03/2015 0928   BASOSABS 0.0 08/25/2015 1457   BASOSABS 0.1 08/04/2015 1000    CMP     Component Value Date/Time   NA 137 12/03/2015 0928   NA 139 09/28/2015 1237   NA 136 08/04/2015 1001   K 4.9 12/03/2015 0928   K 4.8 08/04/2015 1001   CL 104 12/03/2015 0928   CO2 15* 12/03/2015 0928   CO2 17* 08/04/2015 1001   GLUCOSE 230* 12/03/2015 0928   GLUCOSE 134* 09/28/2015 1237   GLUCOSE 215* 08/04/2015 1001   BUN 32* 12/03/2015 0928   BUN 26* 09/28/2015 1237   BUN 27.0* 08/04/2015 1001   CREATININE 2.99* 12/03/2015 0928   CREATININE 3.1* 08/04/2015 1001   CREATININE 1.36* 03/29/2013 1240   CALCIUM 9.1 12/03/2015 0928   CALCIUM 9.5 08/04/2015 1001   CALCIUM 8.1* 08/27/2009 1319   PROT 6.1 12/03/2015 0928   PROT 6.7 09/28/2015 1237   PROT 6.7 08/04/2015 1001   ALBUMIN 3.6 12/03/2015 0928   ALBUMIN 3.1* 09/28/2015 1237   ALBUMIN 3.1* 08/04/2015 1001   AST 40 12/03/2015 0928   AST 70* 08/04/2015 1001   ALT 32 12/03/2015 0928   ALT 71* 08/04/2015 1001   ALKPHOS 203* 12/03/2015 0928   ALKPHOS 240* 08/04/2015 1001   BILITOT 0.2 12/03/2015 0928   BILITOT 0.4 09/28/2015 1237   BILITOT 0.36 08/04/2015 1001   GFRNONAA 14* 12/03/2015 0928   GFRNONAA 38* 03/29/2013 1240   GFRAA 17* 12/03/2015 0928   GFRAA 44* 03/29/2013 1240    Lab Results  Component Value Date   INR 1.0 04/17/2015   INR 1.0 09/24/2014   INR 1.02 10/21/2009       Assessment & Plan:  80 year old female with a complex medical history who is followed by me for her history of GERD,  gastroparesis, chronic nausea, elevated liver enzymes with fatty liver, history of diverticulitis status post colonic resection with end ileostomy in place who presents for follow-up.  1. Chronic nausea/gastroparesis -- doing well from this standpoint recently. Continue Reglan 5 mg with meals. Can use and at bedtime dose if needed. Gastroparesis diet recommended. Zofran 4-8 mg every 8 hours as needed.  2. GERD -- well-controlled on pantoprazole. Reduce to 40 mg once daily  3. Gastric polyps -- fundic gland in nature and benign. This does not need surveillance.  4. Fatty liver/elevated liver enzymes -- actual improvement in liver enzymes recently which is encouraging. Alkaline phosphatase remains slightly elevated but lower than previously. This can be monitored routinely at this point. No evidence for liver decompensation.  Follow-up  6-12 months as needed 25 minutes spent with the patient today. Greater than 50% was spent in counseling and coordination of care with the patient

## 2015-12-30 NOTE — Patient Instructions (Signed)
Please continue current medications.  We have sent a refill of zofran to your pharmacy. Let us know when you need refills on reglan and Protonix.  Please follow up with Dr Hilarie Fredrickson in 9 months.

## 2016-01-13 ENCOUNTER — Telehealth: Payer: Self-pay | Admitting: Hematology and Oncology

## 2016-01-13 NOTE — Telephone Encounter (Signed)
pt primary physician advised pt to get sooner appt due to lab work concerns

## 2016-01-19 ENCOUNTER — Other Ambulatory Visit: Payer: Self-pay | Admitting: Family Medicine

## 2016-01-22 ENCOUNTER — Ambulatory Visit (HOSPITAL_BASED_OUTPATIENT_CLINIC_OR_DEPARTMENT_OTHER): Payer: Medicare Other

## 2016-01-22 ENCOUNTER — Ambulatory Visit (HOSPITAL_BASED_OUTPATIENT_CLINIC_OR_DEPARTMENT_OTHER): Payer: Medicare Other | Admitting: Hematology and Oncology

## 2016-01-22 VITALS — BP 151/47 | HR 57 | Temp 97.5°F | Resp 18 | Ht 61.0 in | Wt 166.3 lb

## 2016-01-22 DIAGNOSIS — D631 Anemia in chronic kidney disease: Secondary | ICD-10-CM | POA: Diagnosis not present

## 2016-01-22 DIAGNOSIS — D472 Monoclonal gammopathy: Secondary | ICD-10-CM

## 2016-01-22 DIAGNOSIS — N184 Chronic kidney disease, stage 4 (severe): Secondary | ICD-10-CM | POA: Diagnosis not present

## 2016-01-22 DIAGNOSIS — R5381 Other malaise: Secondary | ICD-10-CM

## 2016-01-22 DIAGNOSIS — N189 Chronic kidney disease, unspecified: Secondary | ICD-10-CM

## 2016-01-22 DIAGNOSIS — E440 Moderate protein-calorie malnutrition: Secondary | ICD-10-CM

## 2016-01-22 LAB — CBC WITH DIFFERENTIAL/PLATELET
BASO%: 0.3 % (ref 0.0–2.0)
Basophils Absolute: 0 10*3/uL (ref 0.0–0.1)
EOS ABS: 0.2 10*3/uL (ref 0.0–0.5)
EOS%: 3.6 % (ref 0.0–7.0)
HEMATOCRIT: 33.2 % — AB (ref 34.8–46.6)
HEMOGLOBIN: 11 g/dL — AB (ref 11.6–15.9)
LYMPH#: 1.3 10*3/uL (ref 0.9–3.3)
LYMPH%: 18.9 % (ref 14.0–49.7)
MCH: 31.3 pg (ref 25.1–34.0)
MCHC: 33.1 g/dL (ref 31.5–36.0)
MCV: 94.6 fL (ref 79.5–101.0)
MONO#: 0.7 10*3/uL (ref 0.1–0.9)
MONO%: 10.6 % (ref 0.0–14.0)
NEUT#: 4.5 10*3/uL (ref 1.5–6.5)
NEUT%: 66.6 % (ref 38.4–76.8)
PLATELETS: 223 10*3/uL (ref 145–400)
RBC: 3.51 10*6/uL — ABNORMAL LOW (ref 3.70–5.45)
RDW: 13.8 % (ref 11.2–14.5)
WBC: 6.7 10*3/uL (ref 3.9–10.3)

## 2016-01-22 LAB — COMPREHENSIVE METABOLIC PANEL
ALBUMIN: 3.1 g/dL — AB (ref 3.5–5.0)
ALK PHOS: 272 U/L — AB (ref 40–150)
ALT: 75 U/L — ABNORMAL HIGH (ref 0–55)
ANION GAP: 9 meq/L (ref 3–11)
AST: 64 U/L — AB (ref 5–34)
BUN: 24.4 mg/dL (ref 7.0–26.0)
CALCIUM: 9.3 mg/dL (ref 8.4–10.4)
CHLORIDE: 111 meq/L — AB (ref 98–109)
CO2: 18 mEq/L — ABNORMAL LOW (ref 22–29)
CREATININE: 2.8 mg/dL — AB (ref 0.6–1.1)
EGFR: 15 mL/min/{1.73_m2} — ABNORMAL LOW (ref >90)
Glucose: 197 mg/dl — ABNORMAL HIGH (ref 70–140)
POTASSIUM: 4.5 meq/L (ref 3.5–5.1)
Sodium: 138 mEq/L (ref 136–145)
Total Bilirubin: 0.31 mg/dL (ref 0.20–1.20)
Total Protein: 7.1 g/dL (ref 6.4–8.3)

## 2016-01-22 NOTE — Assessment & Plan Note (Signed)
She is seen today sooner than her anticipated appointment because of recent progressive renal failure, raising the concern for possible diagnosis of multiple myeloma. I will order blood tests and will call the patient with test results. If there is significant change in her blood work, I will order additional workup

## 2016-01-22 NOTE — Progress Notes (Signed)
[facility] OFFICE PROGRESS NOTE  Patient Care Team: Chipper Herb, MD as PCP - General (Family Medicine) Estanislado Emms, MD as Consulting Physician (Nephrology) Heath Lark, MD as Consulting Physician (Hematology and Oncology)  SUMMARY OF ONCOLOGIC HISTORY:  [name] is here because of proteinuria, chronic kidney disease, and anemia, concern for monoclonal paraproteinemia She denies history of abnormal bone pain or bone fracture. She has chronic back pain from degenerative arthritis.  Patient denies history of recurrent infection or atypical infections such as shingles of meningitis. Denies chills, night sweats, anorexia or abnormal weight loss. The patient has poorly controlled diabetes. She had renal dysfunction many years ago and was on temporary hemodialysis. The patient have colostomy long-term. She was seen in November 2015 with extensive workup which showed she have IgA kappa MGUS. She is observed. In September 2016, the patient suffered from stroke  INTERVAL HISTORY: Please see below for problem oriented charting. Since the last time I saw her, she has lost a lot of weight and progressive decline in performance status. She has recurrent urinary tract infection in October and November. She denies recent falls. Her husband noted that she has poor memory and has intermittent confusion. She has occasional back pain but he were not new. She spend more than 12 hours sleeping. She has generalized weakness and is not doing any more physical therapy.  REVIEW OF SYSTEMS:   Constitutional: Denies fevers, chills  Eyes: Denies blurriness of vision Ears, nose, mouth, throat, and face: Denies mucositis or sore throat Respiratory: Denies cough, dyspnea or wheezes Cardiovascular: Denies palpitation, chest discomfort or lower extremity swelling Gastrointestinal:  Denies nausea, heartburn or change in bowel habits Skin: Denies abnormal skin rashes Lymphatics: Denies  new lymphadenopathy or easy bruising Neurological:Denies numbness, tingling  Behavioral/Psych: Mood is stable, no new changes  All other systems were reviewed with the patient and are negative.  I have reviewed the past medical history, past surgical history, social history and family history with the patient and they are unchanged from previous note.  ALLERGIES:  is allergic to ace inhibitors; actos; aspirin; codeine; hydromorphone hcl; metformin and related; penicillins; procaine hcl; statins; and sulfa antibiotics.  MEDICATIONS:  Current Outpatient Prescriptions  Medication Sig Dispense Refill  . acetaminophen (TYLENOL) 500 MG tablet Take 1,000 mg by mouth every 6 (six) hours as needed for moderate pain.     Marland Kitchen amLODipine (NORVASC) 10 MG tablet TAKE 1 TABLET ONCE A DAY (Patient taking differently: TAKE ONE-HALF OF A TABLET ONCE A DAY) 90 tablet 1  . clopidogrel (PLAVIX) 75 MG tablet Take 1 tablet (75 mg total) by mouth daily. 30 tablet 6  . clotrimazole (LOTRIMIN) 1 % cream Apply 1 application topically 2 (two) times daily. 30 g 0  . glucagon (GLUCAGON EMERGENCY) 1 MG injection Inject into the muscle as needed for severe low BG 1 each 1  . Insulin Glargine (LANTUS SOLOSTAR) 100 UNIT/ML Solostar Pen Inject 38 Units into the skin every evening. Take 38 units in the evening 5 pen 1  . insulin lispro (HUMALOG KWIKPEN) 100 UNIT/ML KiwkPen Inject 12 Units into the skin 3 (three) times daily.    . Insulin Pen Needle (PEN NEEDLES 31GX5/16") 31G X 8 MM MISC USE AS DIRECTED TWICE A DAY (Patient taking differently: 1 each by Other route 3 (three) times daily. USE AS DIRECTED TWICE A DAY) 100 each 11  . Lancets (ONETOUCH ULTRASOFT) lancets CHECK BLOOD SUGAR 4 TIMES A DAY 100 each 1  .  levothyroxine (SYNTHROID, LEVOTHROID) 50 MCG tablet TAKE 1 TABLET DAILY 30 tablet 9  . metoCLOPramide (REGLAN) 5 MG tablet Take 1 tab by mouth in the morning, mid day and in the evening. 90 tablet 1  . metoprolol tartrate  (LOPRESSOR) 25 MG tablet TAKE 1 & 1/2 TABLETS TWICE A DAY 90 tablet 1  . omega-3 acid ethyl esters (LOVAZA) 1 g capsule TAKE (2) CAPSULES TWICE DAILY. 120 capsule 2  . ondansetron (ZOFRAN) 8 MG tablet Take 0.5 tablets (4 mg total) by mouth 2 (two) times daily. 40 tablet 2  . ONE TOUCH ULTRA TEST test strip Test blood sugar up to 5 times per day prn. DX E11.8 150 each 5  . pantoprazole (PROTONIX) 40 MG tablet TAKE (1) TABLET TWICE A DAY. 180 tablet 1  . Polyethyl Glycol-Propyl Glycol 0.4-0.3 % SOLN Apply 1 drop to eye 3 (three) times daily as needed (dry eyes).    . pravastatin (PRAVACHOL) 20 MG tablet Take 1 tablet (20 mg total) by mouth daily at 6 PM. 30 tablet 6   No current facility-administered medications for this visit.    PHYSICAL EXAMINATION: ECOG PERFORMANCE STATUS: 3 - Symptomatic, >50% confined to bed  Filed Vitals:   01/22/16 1247  BP: 151/47  Pulse: 57  Temp: 97.5 F (36.4 C)  Resp: 18   Filed Weights   01/22/16 1247  Weight: 166 lb 4.8 oz (75.433 kg)    GENERAL:alert, no distress and comfortable. She looks thinner than I remembered SKIN: skin color, texture, turgor are normal, no rashes or significant lesions EYES: normal, Conjunctiva are pink and non-injected, sclera clear OROPHARYNX:no exudate, no erythema and lips, buccal mucosa, and tongue normal  NECK: supple, thyroid normal size, non-tender, without nodularity LYMPH:  no palpable lymphadenopathy in the cervical, axillary or inguinal LUNGS: clear to auscultation and percussion with normal breathing effort HEART: regular rate & rhythm and no murmurs and no lower extremity edema ABDOMEN:abdomen soft, non-tender and normal bowel sounds Musculoskeletal:no cyanosis of digits and no clubbing  NEURO: alert & oriented x 3 with fluent speech, no focal motor/sensory deficits  LABORATORY DATA:  I have reviewed the data as listed    Component Value Date/Time   NA 138 01/22/2016 1339   NA 137 12/03/2015 0928   NA  139 09/28/2015 1237   K 4.5 01/22/2016 1339   K 4.9 12/03/2015 0928   CL 104 12/03/2015 0928   CO2 18* 01/22/2016 1339   CO2 15* 12/03/2015 0928   GLUCOSE 197* 01/22/2016 1339   GLUCOSE 230* 12/03/2015 0928   GLUCOSE 134* 09/28/2015 1237   BUN 24.4 01/22/2016 1339   BUN 32* 12/03/2015 0928   BUN 26* 09/28/2015 1237   CREATININE 2.8* 01/22/2016 1339   CREATININE 2.99* 12/03/2015 0928   CREATININE 1.36* 03/29/2013 1240   CALCIUM 9.3 01/22/2016 1339   CALCIUM 9.1 12/03/2015 0928   CALCIUM 8.1* 08/27/2009 1319   PROT 7.1 01/22/2016 1339   PROT 6.1 12/03/2015 0928   PROT 6.7 09/28/2015 1237   ALBUMIN 3.1* 01/22/2016 1339   ALBUMIN 3.6 12/03/2015 0928   ALBUMIN 3.1* 09/28/2015 1237   AST 64* 01/22/2016 1339   AST 40 12/03/2015 0928   ALT 75* 01/22/2016 1339   ALT 32 12/03/2015 0928   ALKPHOS 272* 01/22/2016 1339   ALKPHOS 203* 12/03/2015 0928   BILITOT 0.31 01/22/2016 1339   BILITOT 0.2 12/03/2015 0928   BILITOT 0.4 09/28/2015 1237   GFRNONAA 14* 12/03/2015 0928   GFRNONAA 38* 03/29/2013 1240  GFRAA 17* 12/03/2015 0928   GFRAA 44* 03/29/2013 1240    No results found for: SPEP, UPEP  Lab Results  Component Value Date   WBC 6.7 01/22/2016   NEUTROABS 4.5 01/22/2016   HGB 11.0* 01/22/2016   HCT 33.2* 01/22/2016   MCV 94.6 01/22/2016   PLT 223 01/22/2016      Chemistry      Component Value Date/Time   NA 138 01/22/2016 1339   NA 137 12/03/2015 0928   NA 139 09/28/2015 1237   K 4.5 01/22/2016 1339   K 4.9 12/03/2015 0928   CL 104 12/03/2015 0928   CO2 18* 01/22/2016 1339   CO2 15* 12/03/2015 0928   BUN 24.4 01/22/2016 1339   BUN 32* 12/03/2015 0928   BUN 26* 09/28/2015 1237   CREATININE 2.8* 01/22/2016 1339   CREATININE 2.99* 12/03/2015 0928   CREATININE 1.36* 03/29/2013 1240      Component Value Date/Time   CALCIUM 9.3 01/22/2016 1339   CALCIUM 9.1 12/03/2015 0928   CALCIUM 8.1* 08/27/2009 1319   ALKPHOS 272* 01/22/2016 1339   ALKPHOS 203* 12/03/2015  0928   AST 64* 01/22/2016 1339   AST 40 12/03/2015 0928   ALT 75* 01/22/2016 1339   ALT 32 12/03/2015 0928   BILITOT 0.31 01/22/2016 1339   BILITOT 0.2 12/03/2015 0928   BILITOT 0.4 09/28/2015 1237     ASSESSMENT & PLAN:  MGUS (monoclonal gammopathy of unknown significance) She is seen today sooner than her anticipated appointment because of recent progressive renal failure, raising the concern for possible diagnosis of multiple myeloma. I will order blood tests and will call the patient with test results. If there is significant change in her blood work, I will order additional workup  Chronic kidney disease, stage IV (severe) ([place]) This is likely due to diabetic nephropathy and poorly controlled hypertension She will continue close follow-up with her nephrologist. As above, I will order additional workup to exclude possible diagnosis of multiple myeloma.   Anemia in chronic kidney disease This is likely anemia of chronic disease. The patient denies recent history of bleeding such as epistaxis, hematuria or hematochezia. She is asymptomatic from the anemia. We will observe for now.  She does not require transfusion now.    Physical deconditioning She has significant progressive decline with generalized physical conditioning. I recommend graduated exercise as tolerated  Protein-calorie malnutrition, moderate ([place]) She had moderate degree of protein calorie malnutrition and gradual weight loss since the last time I saw her. I suspect this could be related to her other medical comorbidities. According to her husband, her diabetes is better controlled. I recommend graduated exercise as tolerated to stimulate her appetite.     Orders Placed This Encounter  Procedures  . CBC with Differential/Platelet    Standing Status: Future     Number of Occurrences: 1     Standing Expiration Date: 02/25/2017  . Comprehensive metabolic panel    Standing Status: Future     Number of  Occurrences: 1     Standing Expiration Date: 02/25/2017  . Multiple Myeloma Panel (SPEP&IFE w/QIG)    Standing Status: Future     Number of Occurrences: 1     Standing Expiration Date: 02/25/2017  . Kappa/lambda light chains    Standing Status: Future     Number of Occurrences: 1     Standing Expiration Date: 02/25/2017   All questions were answered. The patient knows to call the clinic with any problems, questions or concerns.  No barriers to learning was detected. I spent 25 minutes counseling the patient face to face. The total time spent in the appointment was 30 minutes and more than 50% was on counseling and review of test results     [facility], [place], MD 01/22/2016 5:05 PM

## 2016-01-22 NOTE — Assessment & Plan Note (Signed)
She has significant progressive decline with generalized physical conditioning. I recommend graduated exercise as tolerated

## 2016-01-22 NOTE — Assessment & Plan Note (Signed)
This is likely due to diabetic nephropathy and poorly controlled hypertension She will continue close follow-up with her nephrologist. As above, I will order additional workup to exclude possible diagnosis of multiple myeloma.

## 2016-01-22 NOTE — Assessment & Plan Note (Addendum)
She had moderate degree of protein calorie malnutrition and gradual weight loss since the last time I saw her. I suspect this could be related to her other medical comorbidities. According to her husband, her diabetes is better controlled. I recommend graduated exercise as tolerated to stimulate her appetite.

## 2016-01-22 NOTE — Assessment & Plan Note (Signed)
This is likely anemia of chronic disease. The patient denies recent history of bleeding such as epistaxis, hematuria or hematochezia. She is asymptomatic from the anemia. We will observe for now.  She does not require transfusion now.   

## 2016-01-23 LAB — KAPPA/LAMBDA LIGHT CHAINS
IG LAMBDA FREE LIGHT CHAIN: 38.82 mg/L — AB (ref 5.71–26.30)
Ig Kappa Free Light Chain: 115.22 mg/L — ABNORMAL HIGH (ref 3.30–19.40)
KAPPA/LAMBDA FLC RATIO: 2.97 — AB (ref 0.26–1.65)

## 2016-01-26 LAB — MULTIPLE MYELOMA PANEL, SERUM
ALPHA2 GLOB SERPL ELPH-MCNC: 1.1 g/dL — AB (ref 0.4–1.0)
Albumin SerPl Elph-Mcnc: 3.2 g/dL (ref 2.9–4.4)
Albumin/Glob SerPl: 1.1 (ref 0.7–1.7)
Alpha 1: 0.3 g/dL (ref 0.0–0.4)
B-GLOBULIN SERPL ELPH-MCNC: 1.2 g/dL (ref 0.7–1.3)
GAMMA GLOB SERPL ELPH-MCNC: 0.7 g/dL (ref 0.4–1.8)
GLOBULIN, TOTAL: 3.2 g/dL (ref 2.2–3.9)
IGG (IMMUNOGLOBIN G), SERUM: 671 mg/dL — AB (ref 700–1600)
IgA, Qn, Serum: 445 mg/dL — ABNORMAL HIGH (ref 64–422)
IgM, Qn, Serum: 89 mg/dL (ref 26–217)
M PROTEIN SERPL ELPH-MCNC: 0.3 g/dL — AB
TOTAL PROTEIN: 6.4 g/dL (ref 6.0–8.5)

## 2016-01-29 ENCOUNTER — Telehealth: Payer: Self-pay | Admitting: *Deleted

## 2016-01-29 NOTE — Telephone Encounter (Signed)
[name] with Dr. Alvy Bimler orders.  Thanked [name] for helping in this matter.

## 2016-01-29 NOTE — Telephone Encounter (Signed)
No need urine test Myeloma panel came back stable Plan to see her in October as scheduled

## 2016-01-29 NOTE — Telephone Encounter (Signed)
"  Just received MyChart results for labs.  EGFR is low = 15 on 01-22-2016.  If Dr, Alvy Bimler wants a 24 hour urine call me at [phone].  I will be in [place] at 1145 this morning for my pre-op check up and can pick up container?  Just trying to save time and gas.  Thanks."

## 2016-02-09 ENCOUNTER — Other Ambulatory Visit: Payer: Self-pay | Admitting: Internal Medicine

## 2016-02-14 ENCOUNTER — Other Ambulatory Visit: Payer: Self-pay | Admitting: Family Medicine

## 2016-02-25 ENCOUNTER — Encounter: Payer: Self-pay | Admitting: Family Medicine

## 2016-02-25 ENCOUNTER — Ambulatory Visit (INDEPENDENT_AMBULATORY_CARE_PROVIDER_SITE_OTHER): Payer: Medicare Other | Admitting: Family Medicine

## 2016-02-25 VITALS — BP 139/62 | HR 59 | Temp 97.8°F | Ht 61.0 in | Wt 164.0 lb

## 2016-02-25 DIAGNOSIS — D472 Monoclonal gammopathy: Secondary | ICD-10-CM

## 2016-02-25 DIAGNOSIS — E785 Hyperlipidemia, unspecified: Secondary | ICD-10-CM

## 2016-02-25 DIAGNOSIS — E1143 Type 2 diabetes mellitus with diabetic autonomic (poly)neuropathy: Secondary | ICD-10-CM | POA: Diagnosis not present

## 2016-02-25 DIAGNOSIS — I63312 Cerebral infarction due to thrombosis of left middle cerebral artery: Secondary | ICD-10-CM | POA: Diagnosis not present

## 2016-02-25 DIAGNOSIS — N184 Chronic kidney disease, stage 4 (severe): Secondary | ICD-10-CM | POA: Diagnosis not present

## 2016-02-25 DIAGNOSIS — E559 Vitamin D deficiency, unspecified: Secondary | ICD-10-CM | POA: Diagnosis not present

## 2016-02-25 DIAGNOSIS — I10 Essential (primary) hypertension: Secondary | ICD-10-CM

## 2016-02-25 DIAGNOSIS — K3184 Gastroparesis: Secondary | ICD-10-CM | POA: Diagnosis not present

## 2016-02-25 DIAGNOSIS — E1159 Type 2 diabetes mellitus with other circulatory complications: Secondary | ICD-10-CM | POA: Diagnosis not present

## 2016-02-25 DIAGNOSIS — E440 Moderate protein-calorie malnutrition: Secondary | ICD-10-CM

## 2016-02-25 NOTE — Patient Instructions (Addendum)
Medicare Annual Wellness Visit  [facility] and the medical providers at [facility] strive to bring you the best medical care.  In doing so we not only want to address your current medical conditions and concerns but also to detect new conditions early and prevent illness, disease and health-related problems.    Medicare offers a yearly Wellness Visit which allows our clinical staff to assess your need for preventative services including immunizations, lifestyle education, counseling to decrease risk of preventable diseases and screening for fall risk and other medical concerns.    This visit is provided free of charge (no copay) for all Medicare recipients. The clinical pharmacists at [facility] have begun to conduct these Wellness Visits which will also include a thorough review of all your medications.    As you primary medical provider recommend that you make an appointment for your Annual Wellness Visit if you have not done so already this year.  You may set up this appointment before you leave today or you may call back [phone] and schedule an appointment.  Please make sure when you call that you mention that you are scheduling your Annual Wellness Visit with the clinical pharmacist so that the appointment may be made for the proper length of time.    Continue current medications. Continue good therapeutic lifestyle changes which include good diet and exercise. Fall precautions discussed with patient. If an FOBT was given today- please return it to our front desk. If you are over 48 years old - you may need Prevnar 70 or the adult Pneumonia vaccine.  **Flu shots are available--- please call and schedule a FLU-CLINIC appointment**  After your visit with [name] today you will receive a survey in the mail or online from [employer] regarding your care with [name]. Please take a moment to fill this out. Your feedback is very  important to [name] as you can help [name] better understand your patient needs as well as improve your experience and satisfaction. WE CARE ABOUT YOU!!!   Continue with regular follow-ups with nephrology gastroenterology and oncology.

## 2016-02-25 NOTE — Progress Notes (Signed)
Subjective:    Patient ID: [name], female    DOB: [birth year], 80 y.o.   MRN: [id number]  HPI Pt here for follow up and management of chronic medical problems which includes diabetes and CKD. She is taking medications regularly.Both the patient and her husband have a caregiver that stays with them during the day. No specific complaints. She is scheduled next week for an eye exam. Routine lab work is not needed today and will be due in June some time. The patient is surprisingly alert with her declining renal function. She's been seeing the nephrologist the gastroenterologist and the oncologist. There's been some discussion that and concerned that she may have multiple myeloma and tests are still pending regarding this. She denies any chest pain or shortness of breath. She has no trouble swallowing and no heartburn indigestion nausea vomiting or diarrhea. Her abdominal pain seems to be diminished with taking the Zofran. She has been seeing the gastroenterologist fairly regularly. She is passing her water without problems. She was very pleasant and calm and responded appropriately to all questions asked of her today.    Patient Active Problem List   Diagnosis Date Noted  . Anemia in chronic kidney disease 01/22/2016  . Physical deconditioning 01/22/2016  . Protein-calorie malnutrition, moderate ([place]) 01/22/2016  . Cerebrovascular accident (CVA) due to thrombosis of left middle cerebral artery ([place]) 09/03/2015  . Ischemic stroke ([place]) 08/15/2015  . Type 2 diabetes mellitus with other circulatory complications ([place])   . Nausea with vomiting   . Malnutrition of moderate degree ([place]) 07/15/2015  . Gastroparesis due to DM ([place]) 01/16/2015  . Gastric polyps 10/11/2014  . MGUS (monoclonal gammopathy of unknown significance) 10/10/2014  . Proteinuria 09/23/2014  . Vitamin D insufficiency 03/29/2013  . Chronic kidney disease, stage IV (severe) ([place]) 09/14/2011  . CVA (cerebral infarction),  past history 09/14/2011  . 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 Hyperlipidemia LDL goal <70 08/12/2008  . Essential hypertension 08/12/2008  . GERD 08/12/2008  . OSTEOARTHRITIS, lumbar spine 08/12/2008  . Osteoporosis, postmenopausal 08/12/2008   Outpatient Encounter Prescriptions as of 02/25/2016  Medication Sig  . acetaminophen (TYLENOL) 500 MG tablet Take 1,000 mg by mouth every 6 (six) hours as needed for moderate pain.   Marland Kitchen amLODipine (NORVASC) 10 MG tablet TAKE 1 TABLET ONCE A DAY  . clopidogrel (PLAVIX) 75 MG tablet Take 1 tablet (75 mg total) by mouth daily.  . clotrimazole (LOTRIMIN) 1 % cream Apply 1 application topically 2 (two) times daily.  Marland Kitchen glucagon (GLUCAGON EMERGENCY) 1 MG injection Inject into the muscle as needed for severe low BG  . Insulin Glargine (LANTUS SOLOSTAR) 100 UNIT/ML Solostar Pen Inject 38 Units into the skin every evening. Take 38 units in the evening  . insulin lispro (HUMALOG KWIKPEN) 100 UNIT/ML KiwkPen Inject 12 Units into the skin 3 (three) times daily.  . Insulin Pen Needle (PEN NEEDLES 31GX5/16") 31G X 8 MM MISC USE AS DIRECTED TWICE A DAY (Patient taking differently: 1 each by Other route 3 (three) times daily. USE AS DIRECTED TWICE A DAY)  . Lancets (ONETOUCH ULTRASOFT) lancets CHECK BLOOD SUGAR 4 TIMES A DAY  . levothyroxine (SYNTHROID, LEVOTHROID) 50 MCG tablet TAKE 1 TABLET DAILY  . metoCLOPramide (REGLAN) 5 MG tablet TAKE 1 TABLET 3 TIMES A DAY (MORNING, NOON, & EVENING)  . metoprolol tartrate (LOPRESSOR) 25 MG tablet TAKE 1 & 1/2 TABLETS TWICE A DAY (Patient taking differently: 1/2 TABLETS TWICE A DAY)  . omega-3  acid ethyl esters (LOVAZA) 1 g capsule TAKE (2) CAPSULES TWICE DAILY.  Marland Kitchen ondansetron (ZOFRAN) 8 MG tablet Take 0.5 tablets (4 mg total) by mouth 2 (two) times daily.  . ONE TOUCH ULTRA TEST test strip Test blood sugar up to 5 times per day prn. DX E11.8  . pantoprazole (PROTONIX) 40 MG tablet TAKE (1) TABLET TWICE A DAY.    Vladimir Faster Glycol-Propyl Glycol 0.4-0.3 % SOLN Apply 1 drop to eye 3 (three) times daily as needed (dry eyes).  . pravastatin (PRAVACHOL) 20 MG tablet Take 1 tablet (20 mg total) by mouth daily at 6 PM.   No facility-administered encounter medications on file as of 02/25/2016.      Review of Systems  Constitutional: Negative.   HENT: Negative.   Eyes: Negative.   Respiratory: Negative.   Cardiovascular: Negative.   Gastrointestinal: Negative.   Endocrine: Negative.   Genitourinary: Negative.   Musculoskeletal: Negative.   Skin: Negative.   Allergic/Immunologic: Negative.   Neurological: Negative.   Hematological: Negative.   Psychiatric/Behavioral: Negative.        Objective:   Physical Exam  Constitutional: She is oriented to person, place, and time. She appears well-developed and well-nourished. No distress.  The patient was alert and in a wheelchair. She was examined in the wheelchair. She is able to walk around the house with her cane. She has not had any falls.  HENT:  Head: Normocephalic.  Right Ear: External ear normal.  Left Ear: External ear normal.  Nose: Nose normal.  Mouth/Throat: Oropharynx is clear and moist.  Eyes: Conjunctivae and EOM are normal. Pupils are equal, round, and reactive to light. Right eye exhibits no discharge. Left eye exhibits no discharge. No scleral icterus.  Neck: Normal range of motion. Neck supple. No thyromegaly present.  No thyromegaly or bruits  Cardiovascular: Normal rate, regular rhythm, normal heart sounds and intact distal pulses.   No murmur heard. The heart was regular today at 72/m  Pulmonary/Chest: Effort normal and breath sounds normal. No respiratory distress. She has no wheezes. She has no rales. She exhibits no tenderness.  Abdominal: Soft. Bowel sounds are normal. She exhibits no mass. There is no tenderness. There is no rebound and no guarding.  The colostomy seems to be draining well and the site appears normal.   Musculoskeletal: She exhibits no edema.  Lymphadenopathy:    She has no cervical adenopathy.  Neurological: She is alert and oriented to person, place, and time. No cranial nerve deficit.  Skin: Skin is warm and dry. No rash noted.  Psychiatric: She has a normal mood and affect. Her behavior is normal. Judgment and thought content normal.  Nursing note and vitals reviewed.  BP 139/62 mmHg  Pulse 59  Temp(Src) 97.8 F (36.6 C) (Oral)  Ht _0  (1.549 m)  Wt 164 lb (74.39 kg)  BMI 31.00 kg/m2        Assessment & Plan:  1. Essential hypertension -The blood pressure is good today and she will continue with current treatment - BMP8+EGFR - CBC with Differential/Platelet  2. HLD (hyperlipidemia) -The patient is statin intolerant so she will continue with diet efforts to keep her cholesterol under control  3. Type 2 diabetes mellitus with other circulatory complications (H[facility]) -According to the patient and her husband and her blood sugars recently have been under better control and are not bouncing around as much. She is not due to get an A1c today.  4. Chronic kidney disease, stage IV (severe) (  [place]) -She will continue to follow-up with the nephrologist  5. Protein-calorie malnutrition, moderate ([place])  6. Cerebrovascular accident (CVA) due to thrombosis of left middle cerebral artery ([place]) -She is doing well with this and is able to walk using a cane at home  7. Gastroparesis due to DM ([place]) -She'll continue to follow-up with gastroenterology  8. MGUS (monoclonal gammopathy of unknown significance) -Continue to follow-up with oncology  9. Hyperlipidemia LDL goal <70 -Continue with aggressive therapeutic lifestyle changes  10. Vitamin D insufficiency  Patient Instructions                       Medicare Annual Wellness Visit   and the medical providers at [facility] strive to bring you the best medical care.  In doing so we not only want  to address your current medical conditions and concerns but also to detect new conditions early and prevent illness, disease and health-related problems.    Medicare offers a yearly Wellness Visit which allows our clinical staff to assess your need for preventative services including immunizations, lifestyle education, counseling to decrease risk of preventable diseases and screening for fall risk and other medical concerns.    This visit is provided free of charge (no copay) for all Medicare recipients. The clinical pharmacists at [facility] have begun to conduct these Wellness Visits which will also include a thorough review of all your medications.    As you primary medical provider recommend that you make an appointment for your Annual Wellness Visit if you have not done so already this year.  You may set up this appointment before you leave today or you may call back [phone]) and schedule an appointment.  Please make sure when you call that you mention that you are scheduling your Annual Wellness Visit with the clinical pharmacist so that the appointment may be made for the proper length of time.    Continue current medications. Continue good therapeutic lifestyle changes which include good diet and exercise. Fall precautions discussed with patient. If an FOBT was given today- please return it to our front desk. If you are over 15 years old - you may need Prevnar 49 or the adult Pneumonia vaccine.  **Flu shots are available--- please call and schedule a FLU-CLINIC appointment**  After your visit with [name] today you will receive a survey in the mail or online from [employer] regarding your care with [name]. Please take a moment to fill this out. Your feedback is very important to [name] as you can help [name] better understand your patient needs as well as improve your experience and satisfaction. WE CARE ABOUT YOU!!!   Continue with regular follow-ups with nephrology  gastroenterology and oncology.   Arrie Senate MD

## 2016-02-26 LAB — CBC WITH DIFFERENTIAL/PLATELET
BASOS: 0 %
Basophils Absolute: 0 10*3/uL (ref 0.0–0.2)
EOS (ABSOLUTE): 0.3 10*3/uL (ref 0.0–0.4)
EOS: 4 %
HEMATOCRIT: 34.9 % (ref 34.0–46.6)
HEMOGLOBIN: 11.4 g/dL (ref 11.1–15.9)
IMMATURE GRANS (ABS): 0 10*3/uL (ref 0.0–0.1)
IMMATURE GRANULOCYTES: 0 %
Lymphocytes Absolute: 1.3 10*3/uL (ref 0.7–3.1)
Lymphs: 19 %
MCH: 30.9 pg (ref 26.6–33.0)
MCHC: 32.7 g/dL (ref 31.5–35.7)
MCV: 95 fL (ref 79–97)
MONOCYTES: 9 %
MONOS ABS: 0.6 10*3/uL (ref 0.1–0.9)
NEUTROS PCT: 68 %
Neutrophils Absolute: 4.7 10*3/uL (ref 1.4–7.0)
Platelets: 229 10*3/uL (ref 150–379)
RBC: 3.69 x10E6/uL — ABNORMAL LOW (ref 3.77–5.28)
RDW: 14.2 % (ref 12.3–15.4)
WBC: 6.9 10*3/uL (ref 3.4–10.8)

## 2016-02-26 LAB — BMP8+EGFR
BUN / CREAT RATIO: 11 — AB (ref 12–28)
BUN: 34 mg/dL — AB (ref 8–27)
CO2: 15 mmol/L — AB (ref 18–29)
CREATININE: 2.98 mg/dL — AB (ref 0.57–1.00)
Calcium: 9.2 mg/dL (ref 8.7–10.3)
Chloride: 104 mmol/L (ref 96–106)
GFR calc Af Amer: 17 mL/min/{1.73_m2} — ABNORMAL LOW (ref >59)
GFR, EST NON AFRICAN AMERICAN: 14 mL/min/{1.73_m2} — AB (ref >59)
Glucose: 181 mg/dL — ABNORMAL HIGH (ref 65–99)
Potassium: 4.9 mmol/L (ref 3.5–5.2)
SODIUM: 136 mmol/L (ref 134–144)

## 2016-03-08 LAB — HM DIABETES EYE EXAM

## 2016-03-22 ENCOUNTER — Other Ambulatory Visit: Payer: Self-pay | Admitting: Family Medicine

## 2016-04-05 ENCOUNTER — Other Ambulatory Visit: Payer: Self-pay | Admitting: Family Medicine

## 2016-04-05 ENCOUNTER — Other Ambulatory Visit: Payer: Self-pay | Admitting: Pharmacist

## 2016-04-09 ENCOUNTER — Other Ambulatory Visit: Payer: Self-pay | Admitting: Internal Medicine

## 2016-04-21 ENCOUNTER — Other Ambulatory Visit: Payer: Self-pay | Admitting: Family Medicine

## 2016-05-05 ENCOUNTER — Other Ambulatory Visit: Payer: Self-pay | Admitting: Family Medicine

## 2016-05-05 ENCOUNTER — Telehealth: Payer: Self-pay | Admitting: Internal Medicine

## 2016-05-05 MED ORDER — ONDANSETRON HCL 8 MG PO TABS: 4.0000 mg | ORAL_TABLET | Freq: Four times a day (QID) | ORAL | Status: AC | PRN

## 2016-05-05 NOTE — Telephone Encounter (Signed)
New script sent to pharmacy with new instructions. Pt was discharged from the nursing home without a new script.

## 2016-05-17 ENCOUNTER — Other Ambulatory Visit: Payer: Self-pay | Admitting: Family Medicine

## 2016-05-28 ENCOUNTER — Encounter: Payer: Self-pay | Admitting: Family Medicine

## 2016-05-30 ENCOUNTER — Encounter: Payer: Self-pay | Admitting: Family Medicine

## 2016-06-01 ENCOUNTER — Encounter: Payer: Self-pay | Admitting: Family Medicine

## 2016-06-01 ENCOUNTER — Ambulatory Visit (INDEPENDENT_AMBULATORY_CARE_PROVIDER_SITE_OTHER): Payer: Medicare Other | Admitting: Family Medicine

## 2016-06-01 VITALS — BP 139/57 | HR 67 | Temp 97.8°F | Ht 61.0 in | Wt 167.0 lb

## 2016-06-01 DIAGNOSIS — I63312 Cerebral infarction due to thrombosis of left middle cerebral artery: Secondary | ICD-10-CM

## 2016-06-01 DIAGNOSIS — E785 Hyperlipidemia, unspecified: Secondary | ICD-10-CM

## 2016-06-01 DIAGNOSIS — N184 Chronic kidney disease, stage 4 (severe): Secondary | ICD-10-CM | POA: Diagnosis not present

## 2016-06-01 DIAGNOSIS — N189 Chronic kidney disease, unspecified: Secondary | ICD-10-CM

## 2016-06-01 DIAGNOSIS — E875 Hyperkalemia: Secondary | ICD-10-CM

## 2016-06-01 DIAGNOSIS — I10 Essential (primary) hypertension: Secondary | ICD-10-CM

## 2016-06-01 DIAGNOSIS — E1159 Type 2 diabetes mellitus with other circulatory complications: Secondary | ICD-10-CM

## 2016-06-01 DIAGNOSIS — E559 Vitamin D deficiency, unspecified: Secondary | ICD-10-CM | POA: Diagnosis not present

## 2016-06-01 DIAGNOSIS — D631 Anemia in chronic kidney disease: Secondary | ICD-10-CM | POA: Diagnosis not present

## 2016-06-01 DIAGNOSIS — Z8673 Personal history of transient ischemic attack (TIA), and cerebral infarction without residual deficits: Secondary | ICD-10-CM

## 2016-06-01 LAB — BAYER DCA HB A1C WAIVED: HB A1C: 7.1 % — AB (ref <7.0)

## 2016-06-01 NOTE — Patient Instructions (Addendum)
Continue current medications. Continue good therapeutic lifestyle changes which include good diet and exercise. Fall precautions discussed with patient. If an FOBT was given today- please return it to our front desk. If you are over 78 years old - you may need Prevnar 42 or the adult Pneumonia vaccine.   After your visit with [name] today you will receive a survey in the mail or online from [employer] regarding your care with [name]. Please take a moment to fill this out. Your feedback is very important to [name] as you can help [name] better understand your patient needs as well as improve your experience and satisfaction. WE CARE ABOUT YOU!!!                         Medicare Annual Wellness Visit  [facility] and the medical providers at [facility] strive to bring you the best medical care.  In doing so we not only want to address your current medical conditions and concerns but also to detect new conditions early and prevent illness, disease and health-related problems.    Medicare offers a yearly Wellness Visit which allows our clinical staff to assess your need for preventative services including immunizations, lifestyle education, counseling to decrease risk of preventable diseases and screening for fall risk and other medical concerns.    This visit is provided free of charge (no copay) for all Medicare recipients. The clinical pharmacists at [facility] have begun to conduct these Wellness Visits which will also include a thorough review of all your medications.    As you primary medical provider recommend that you make an appointment for your Annual Wellness Visit if you have not done so already this year.  You may set up this appointment before you leave today or you may call back [phone] and schedule an appointment.  Please make sure when you call that you mention that you are scheduling your Annual Wellness Visit with the clinical pharmacist so that  the appointment may be made for the proper length of time.    The patient will consider the possibility of bringing in hospice care and the family will let us know if she decides to do that They will not cancel future appointments with the specialist and will go to these appointments on an as-needed basis. If they choose hospice we can hopefully decrease the need for those appointments. The patient was encouraged to stay as active as physically possible and to not resigned herself to the bed. She agreed to this.

## 2016-06-01 NOTE — Progress Notes (Addendum)
Subjective:    Patient ID: [name], female    DOB: [birth year], 80 y.o.   MRN: [id number]  HPI     Patient is here today for follow up of chronic medical problems including hypertension, diabetes, hyperlipidemia, and chronic kidney failure.This is a somewhat complicated history. The patient has declining renal function. She her husband and her children have decided that they did not want to pursue dialysis. She's been encouraged by the nephrologist to consider this but the patient personally does not want to go through this again as she has done this in the past. The patient's spouse is not able to take the patient back and forth to dialysis. She would have to go to the nursing home and be transported 3 times a week for dialysis. The current AV fistula has failed. She would prefer not to do this and the family is in agreement with this. The information derived with this note was received from both the husband and the patient. The patient was pleasant, relaxed and understands her current situation and is in agreement that no aggressive treatment with the chronic kidney disease be pursued at this time. We even discussed the possibility of bringing in hospice and she will consider this and get back in touch with [name] after some more thought is given to this. She denies feeling bad. She just feels tired. She is alert. She denies any chest pain shortness of breath anymore than expected nausea vomiting diarrhea blood in the stool or black tarry bowel movements. She uses a cane around the house and occasionally a walker. She sleeps a better part of the day. Her colostomy is working appropriately. And she will also consider the possibility of bringing in hospice and will get back in touch with [name] after she is put some thought into this. They will keep future appointments with hematology oncology and with nephrology on an as-needed basis.   Review of Systems  Constitutional: Positive for fatigue.  HENT:  Negative.   Eyes: Negative.   Respiratory: Negative.   Cardiovascular: Negative.   Gastrointestinal: Negative.        Patient has colostomy bag in place.  Endocrine: Negative.   Genitourinary: Negative.   Musculoskeletal: Positive for back pain.       Right side pain  Skin: Negative.   Allergic/Immunologic: Negative.   Neurological: Positive for weakness.  Hematological: Negative.   Psychiatric/Behavioral: Negative.   All other systems reviewed and are negative.      Objective:   Physical Exam  Constitutional: She is oriented to person, place, and time. She appears well-developed and well-nourished. No distress.  The patient is alert and looks good considering her condition. She is in a wheelchair. She was examined in the wheelchair.  HENT:  Head: Normocephalic and atraumatic.  Right Ear: External ear normal.  Left Ear: External ear normal.  Nose: Nose normal.  Mouth/Throat: Oropharynx is clear and moist. No oropharyngeal exudate.  Eyes: Conjunctivae and EOM are normal. Pupils are equal, round, and reactive to light. Right eye exhibits no discharge. Left eye exhibits no discharge. No scleral icterus.  Neck: Normal range of motion. Neck supple. No thyromegaly present.  Neck was supple without adenopathy or bruits  Cardiovascular: Normal rate, regular rhythm and normal heart sounds.   No murmur heard. The heart had a regular rate and rhythm at 72/m  Pulmonary/Chest: Effort normal and breath sounds normal. No respiratory distress. She has no wheezes. She has no rales.  Clear anteriorly  and posteriorly  Abdominal: Soft. Bowel sounds are normal. She exhibits no mass. There is no tenderness. There is no rebound and no guarding.  The abdomen was obese and nontender. There were no masses. The colostomy bag appeared to be draining well.  Musculoskeletal: She exhibits no edema.  The patient was in a wheelchair but is able to ambulate around the house and get up at night from the bed and  go to the bathroom using a cane.  Lymphadenopathy:    She has no cervical adenopathy.  Neurological: She is alert and oriented to person, place, and time.  Skin: Skin is warm and dry. No rash noted.  Psychiatric: She has a normal mood and affect. Her behavior is normal. Judgment and thought content normal.  Nursing note and vitals reviewed.    BP (!) 139/57 (BP Location: Left Arm, Patient Position: Sitting, Cuff Size: Normal)   Pulse 67   Temp 97.8 F (36.6 C) (Oral)   Ht _0  (1.549 m)   Wt 167 lb (75.8 kg)   BMI 31.55 kg/m           Assessment & Plan:  1. Essential hypertension -The blood pressure is good today and she will continue with current treatment - BMP8+EGFR - VITAMIN D 25 Hydroxy (Vit-D Deficiency, Fractures) - Lipid panel - Bayer DCA Hb A1c Waived  2. Type 2 diabetes mellitus with other circulatory complications (HCC) -The hemoglobin A1c will be checked and treatment will be determined if it needs to be changed. - BMP8+EGFR - VITAMIN D 25 Hydroxy (Vit-D Deficiency, Fractures) - Lipid panel - Bayer DCA Hb A1c Waived  3. Anemia in chronic kidney disease - BMP8+EGFR - VITAMIN D 25 Hydroxy (Vit-D Deficiency, Fractures) - Lipid panel - Bayer DCA Hb A1c Waived  4. Hyperlipidemia LDL goal <70 -Continue current treatment pending results of lab work - BMP8+EGFR - VITAMIN D 25 Hydroxy (Vit-D Deficiency, Fractures) - Lipid panel - Bayer DCA Hb A1c Waived  5. Chronic kidney disease, stage IV (severe) (HCC) -Continue to follow-up with nephrology as needed and the decision has been made by the patient herself that she does not want to pursue aggressive dialysis treatments at this time.  6. Vitamin D insufficiency  7. HLD (hyperlipidemia) -Continue with healthy diet efforts  8. Cerebrovascular accident (CVA) due to thrombosis of left middle cerebral artery ([place]) -The patient has no further weak spells or CVA symptoms. She will follow-up with neurology as  needed.  Patient Instructions  Continue current medications. Continue good therapeutic lifestyle changes which include good diet and exercise. Fall precautions discussed with patient. If an FOBT was given today- please return it to our front desk. If you are over 54 years old - you may need Prevnar 1 or the adult Pneumonia vaccine.   After your visit with [name] today you will receive a survey in the mail or online from [employer] regarding your care with [name]. Please take a moment to fill this out. Your feedback is very important to [name] as you can help [name] better understand your patient needs as well as improve your experience and satisfaction. WE CARE ABOUT YOU!!!                         Medicare Annual Wellness Visit  [facility] and the medical providers at [facility] strive to bring you the best medical care.  In doing so we not only want to address your  current medical conditions and concerns but also to detect new conditions early and prevent illness, disease and health-related problems.    Medicare offers a yearly Wellness Visit which allows our clinical staff to assess your need for preventative services including immunizations, lifestyle education, counseling to decrease risk of preventable diseases and screening for fall risk and other medical concerns.    This visit is provided free of charge (no copay) for all Medicare recipients. The clinical pharmacists at [facility] have begun to conduct these Wellness Visits which will also include a thorough review of all your medications.    As you primary medical provider recommend that you make an appointment for your Annual Wellness Visit if you have not done so already this year.  You may set up this appointment before you leave today or you may call back [phone]) and schedule an appointment.  Please make sure when you call that you mention that you are scheduling your Annual Wellness Visit with  the clinical pharmacist so that the appointment may be made for the proper length of time.    The patient will consider the possibility of bringing in hospice care and the family will let us know if she decides to do that They will not cancel future appointments with the specialist and will go to these appointments on an as-needed basis. If they choose hospice we can hopefully decrease the need for those appointments. The patient was encouraged to stay as active as physically possible and to not resigned herself to the bed. She agreed to this.  After the visit, the pt and family called and has decided to pursue Hospice = order placed.   Arrie Senate MD

## 2016-06-02 ENCOUNTER — Encounter: Payer: Self-pay | Admitting: Family Medicine

## 2016-06-02 ENCOUNTER — Telehealth: Payer: Self-pay | Admitting: Family Medicine

## 2016-06-02 LAB — BMP8+EGFR
BUN/Creatinine Ratio: 12 (ref 12–28)
BUN: 36 mg/dL — ABNORMAL HIGH (ref 8–27)
CO2: 17 mmol/L — AB (ref 18–29)
Calcium: 9.3 mg/dL (ref 8.7–10.3)
Chloride: 102 mmol/L (ref 96–106)
Creatinine, Ser: 3.07 mg/dL (ref 0.57–1.00)
GFR calc Af Amer: 16 mL/min/{1.73_m2} — ABNORMAL LOW (ref >59)
GFR, EST NON AFRICAN AMERICAN: 14 mL/min/{1.73_m2} — AB (ref >59)
Glucose: 245 mg/dL — ABNORMAL HIGH (ref 65–99)
POTASSIUM: 5.7 mmol/L — AB (ref 3.5–5.2)
SODIUM: 136 mmol/L (ref 134–144)

## 2016-06-02 LAB — LIPID PANEL
CHOLESTEROL TOTAL: 161 mg/dL (ref 100–199)
Chol/HDL Ratio: 3 ratio units (ref 0.0–4.4)
HDL: 54 mg/dL (ref >39)
LDL Calculated: 64 mg/dL (ref 0–99)
Triglycerides: 216 mg/dL — ABNORMAL HIGH (ref 0–149)
VLDL Cholesterol Cal: 43 mg/dL — ABNORMAL HIGH (ref 5–40)

## 2016-06-02 LAB — VITAMIN D 25 HYDROXY (VIT D DEFICIENCY, FRACTURES): VIT D 25 HYDROXY: 14.3 ng/mL — AB (ref 30.0–100.0)

## 2016-06-03 NOTE — Telephone Encounter (Signed)
Hospice referral made. OV notes, demos faxed. Spoke to [employer] on phone.

## 2016-06-03 NOTE — Addendum Note (Signed)
Addended by: Karle Plumber on: 06/03/2016 08:55 AM   Modules accepted: Orders

## 2016-06-03 NOTE — Addendum Note (Signed)
Addended by: Zannie Cove on: 06/03/2016 01:33 PM   Modules accepted: Orders

## 2016-06-04 ENCOUNTER — Other Ambulatory Visit: Payer: Self-pay | Admitting: *Deleted

## 2016-06-04 DIAGNOSIS — N185 Chronic kidney disease, stage 5: Secondary | ICD-10-CM

## 2016-06-04 DIAGNOSIS — Z01810 Encounter for preprocedural cardiovascular examination: Secondary | ICD-10-CM

## 2016-06-08 ENCOUNTER — Other Ambulatory Visit: Payer: Medicare Other

## 2016-06-08 DIAGNOSIS — E875 Hyperkalemia: Secondary | ICD-10-CM

## 2016-06-09 LAB — BMP8+EGFR
BUN/Creatinine Ratio: 11 — ABNORMAL LOW (ref 12–28)
BUN: 33 mg/dL — AB (ref 8–27)
CO2: 14 mmol/L — AB (ref 18–29)
CREATININE: 3.12 mg/dL — AB (ref 0.57–1.00)
Calcium: 9 mg/dL (ref 8.7–10.3)
Chloride: 108 mmol/L — ABNORMAL HIGH (ref 96–106)
GFR calc Af Amer: 16 mL/min/{1.73_m2} — ABNORMAL LOW (ref >59)
GFR, EST NON AFRICAN AMERICAN: 14 mL/min/{1.73_m2} — AB (ref >59)
Glucose: 94 mg/dL (ref 65–99)
Potassium: 4.8 mmol/L (ref 3.5–5.2)
SODIUM: 140 mmol/L (ref 134–144)

## 2016-06-10 ENCOUNTER — Ambulatory Visit: Payer: Medicare Other | Admitting: Neurology

## 2016-06-14 ENCOUNTER — Encounter: Payer: Self-pay | Admitting: Family Medicine

## 2016-06-14 ENCOUNTER — Other Ambulatory Visit: Payer: Self-pay | Admitting: Family Medicine

## 2016-06-14 ENCOUNTER — Telehealth: Payer: Self-pay | Admitting: Pharmacist

## 2016-06-14 NOTE — Telephone Encounter (Signed)
Note from patient's husband.   [name] chose NOT to go back on kidney dialysis. Thus her insulin dependency is changing. Recently her blood sugar has dropped several times. This usually occurs around 3:00 to 4:00 AM. She can drink OJ or coke with a cracker and thus brings her sugar back in order. The last couple of evenings (before bedtime) she has begun using less units of Lantus. Dropping from 38 to 35 units. Yet she still experiences a sugar drop but not as frequently. She continues with her same dosage of humalog being 12 units three times per day at meal times. Admittedly she does not always take a sugar reading during aforementioned, merely goes by her feeling.   Spoke with patient.  She denies having hypoglycemia any other times during the day.  Also no reports of hyperglycemia during day.   Recommend decrease Lantus to 32 units each evening.  If over the next 7 days she has another BG reading less than 70, then she is to drop Lantus further to 30 units once a day and call me.  Continue Humalog 12 units with each meal.

## 2016-06-21 ENCOUNTER — Other Ambulatory Visit: Payer: Self-pay | Admitting: Family Medicine

## 2016-06-23 ENCOUNTER — Other Ambulatory Visit (HOSPITAL_COMMUNITY): Payer: Medicare Other

## 2016-06-23 ENCOUNTER — Encounter (HOSPITAL_COMMUNITY): Payer: Medicare Other

## 2016-06-23 ENCOUNTER — Ambulatory Visit: Payer: Medicare Other | Admitting: Surgery

## 2016-07-15 ENCOUNTER — Other Ambulatory Visit (HOSPITAL_COMMUNITY)
Admission: RE | Admit: 2016-07-15 | Discharge: 2016-07-15 | Disposition: A | Discharge status: Home / Self Care | Payer: Medicare Other | Source: Other Acute Inpatient Hospital | Attending: Hematology and Oncology | Admitting: Hematology and Oncology

## 2016-07-15 DIAGNOSIS — N189 Chronic kidney disease, unspecified: Secondary | ICD-10-CM | POA: Insufficient documentation

## 2016-07-15 LAB — BASIC METABOLIC PANEL
Anion gap: 9 (ref 5–15)
BUN: 56 mg/dL — ABNORMAL HIGH (ref 6–20)
CALCIUM: 9.1 mg/dL (ref 8.9–10.3)
CHLORIDE: 110 mmol/L (ref 101–111)
CO2: 19 mmol/L — AB (ref 22–32)
CREATININE: 3.35 mg/dL — AB (ref 0.44–1.00)
GFR calc Af Amer: 14 mL/min — ABNORMAL LOW (ref >60)
GFR calc non Af Amer: 12 mL/min — ABNORMAL LOW (ref >60)
GLUCOSE: 183 mg/dL — AB (ref 65–99)
Potassium: 4.2 mmol/L (ref 3.5–5.1)
Sodium: 138 mmol/L (ref 135–145)

## 2016-07-29 ENCOUNTER — Telehealth: Payer: Self-pay | Admitting: Family Medicine

## 2016-07-30 ENCOUNTER — Encounter: Payer: Self-pay | Admitting: Hematology and Oncology

## 2016-08-01 NOTE — Telephone Encounter (Signed)
I called patient's husband with our condolences and he seemed to be doing well knowing that there was no other way for the patient to get better.

## 2016-08-01 DEATH — deceased

## 2016-08-06 ENCOUNTER — Other Ambulatory Visit: Payer: Medicare Other

## 2016-08-13 ENCOUNTER — Ambulatory Visit: Payer: Medicare Other | Admitting: Hematology and Oncology

## 2016-10-04 ENCOUNTER — Ambulatory Visit: Payer: Medicare Other | Admitting: Family Medicine

## 2016-12-03 IMAGING — CR DG ABDOMEN ACUTE W/ 1V CHEST
4 series · 4 of 4 positions shown · non-contrast
Comparison: Chest radiograph 03/09/2015

CLINICAL DATA: Patient with left-sided abdominal pain above
colostomy bag. Persistent cough for multiple weeks.

EXAM:
DG ABDOMEN ACUTE W/ 1V CHEST

[w chest pa]
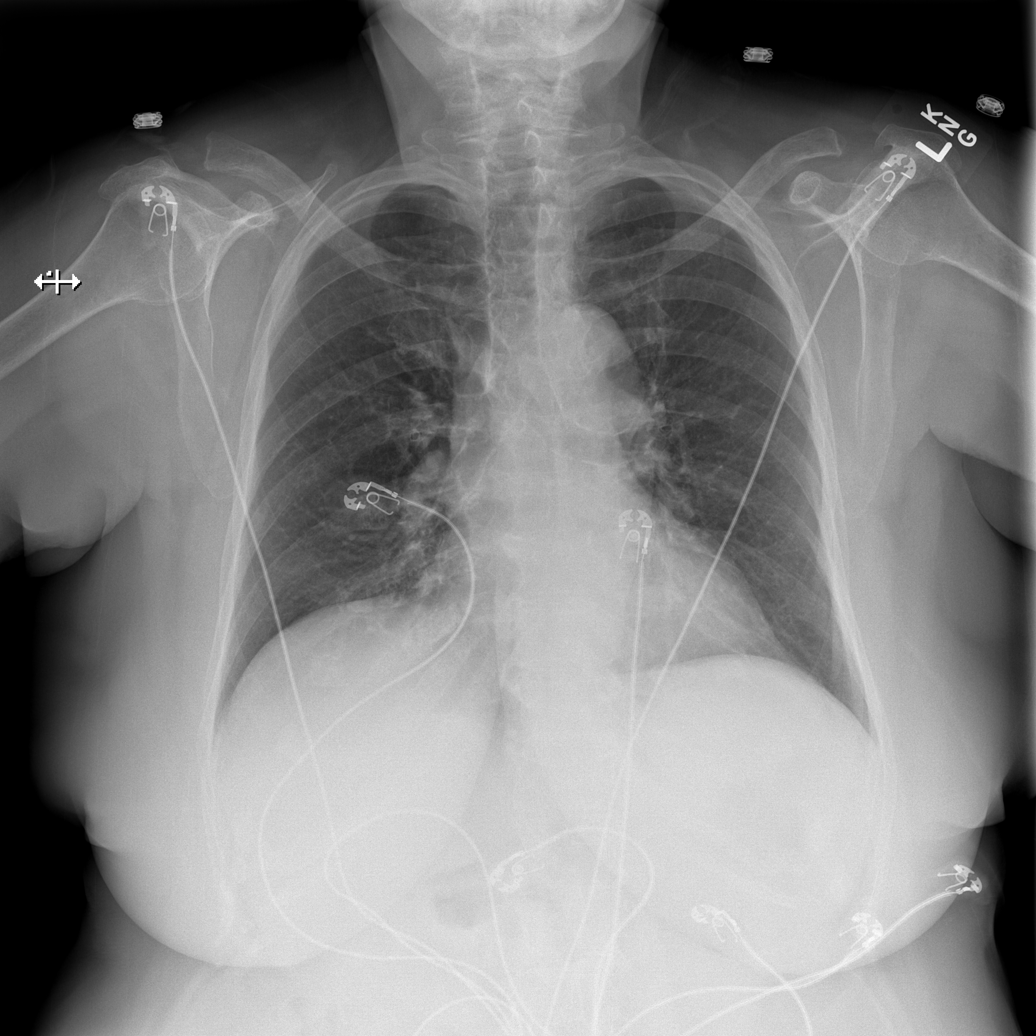

[w abdomen upright]
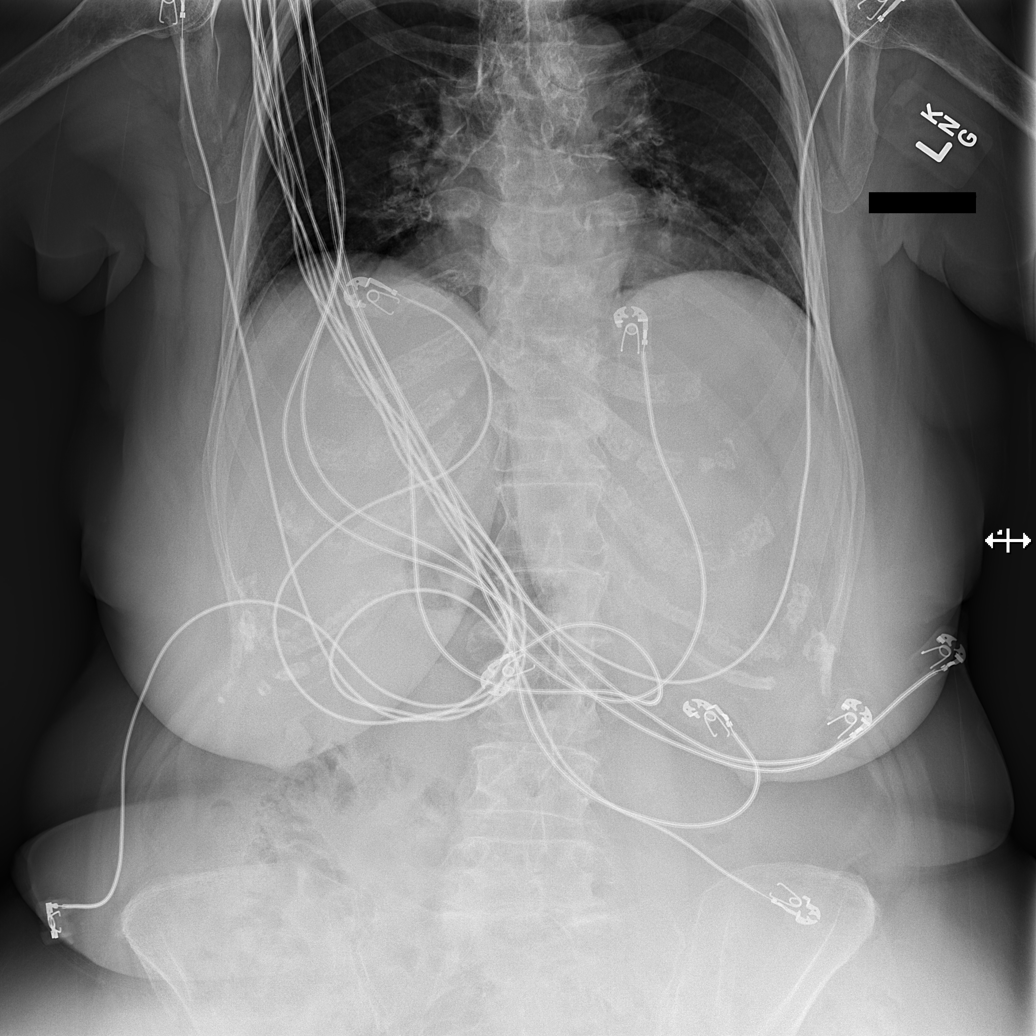

[t abdomen supine (1 of 2)]
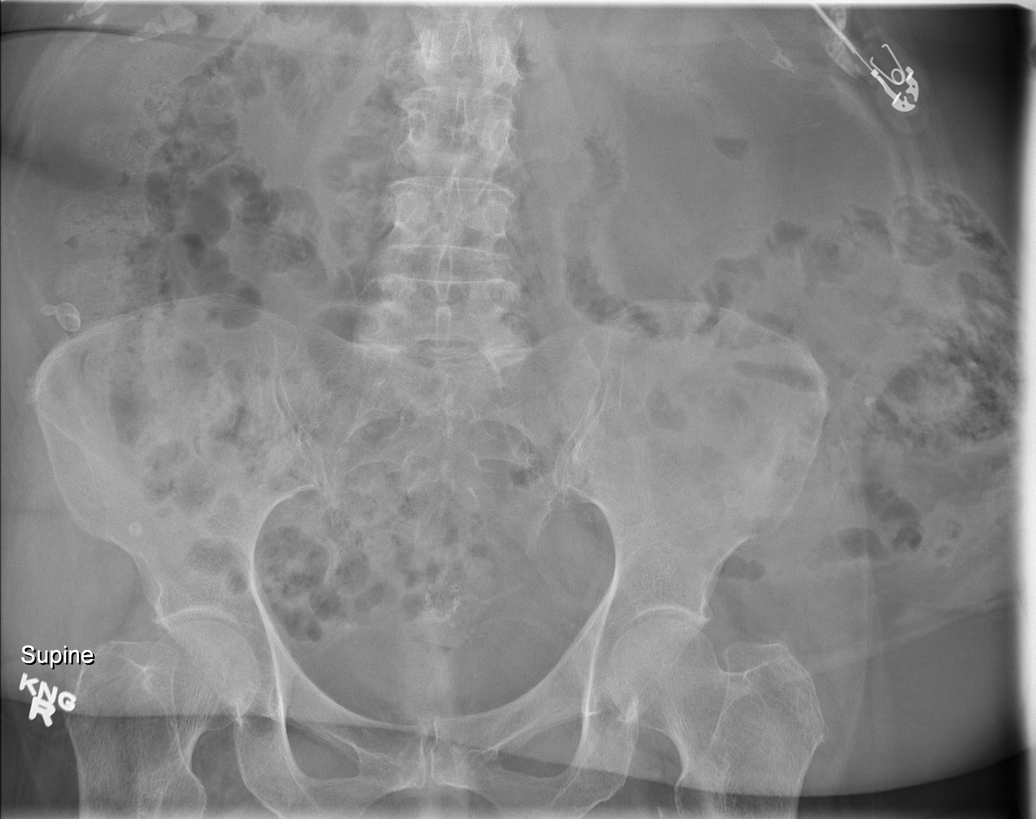

[t abdomen supine (2 of 2)]
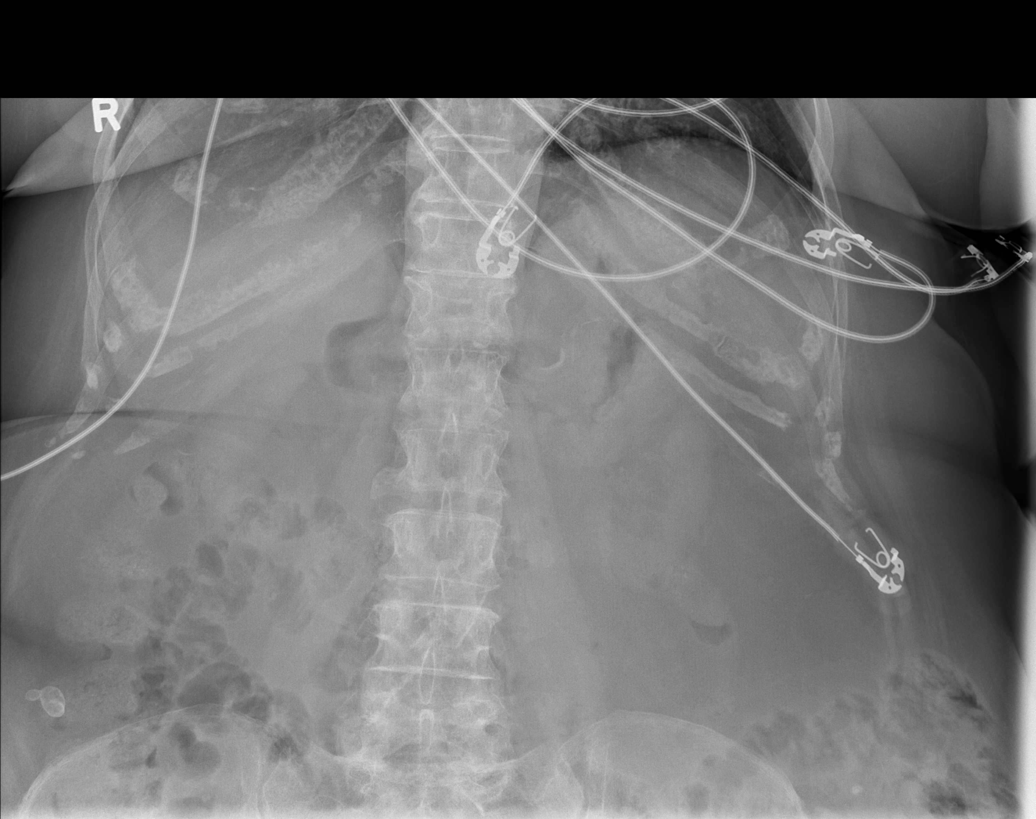

[4 of 4 positions shown; findings below may reference images not displayed]

FINDINGS: Monitoring leads overlie the patient. Stable cardiac and mediastinal
contours. No consolidative pulmonary opacities. No pleural effusion
or pneumothorax.

Gas is demonstrated within nondilated loops of large and small bowel
within the abdomen. The entire left hemi abdomen is not included on
this radiograph however there may potentially be a left parastomal
hernia. Lumbar spine degenerative changes.
IMPRESSION: The entire left hemi abdomen is not included on current radiograph
however there may be a left parastomal hernia. No evidence for small
bowel obstruction.

No acute cardiopulmonary process.
